# Patient Record
Sex: Female | Born: 1945
Health system: Southern US, Community
[De-identification: ages and names within clinical notes are randomized; demographics above are authoritative.]

## PROBLEM LIST (undated history)

## (undated) DIAGNOSIS — Z9109 Other allergy status, other than to drugs and biological substances: Secondary | ICD-10-CM

## (undated) DIAGNOSIS — G43909 Migraine, unspecified, not intractable, without status migrainosus: Secondary | ICD-10-CM

## (undated) DIAGNOSIS — E7800 Pure hypercholesterolemia, unspecified: Secondary | ICD-10-CM

## (undated) DIAGNOSIS — H269 Unspecified cataract: Secondary | ICD-10-CM

## (undated) DIAGNOSIS — T7840XA Allergy, unspecified, initial encounter: Secondary | ICD-10-CM

## (undated) DIAGNOSIS — Z5189 Encounter for other specified aftercare: Secondary | ICD-10-CM

## (undated) DIAGNOSIS — C801 Malignant (primary) neoplasm, unspecified: Secondary | ICD-10-CM

## (undated) DIAGNOSIS — I10 Essential (primary) hypertension: Secondary | ICD-10-CM

## (undated) HISTORY — DX: Malignant (primary) neoplasm, unspecified: C80.1

## (undated) HISTORY — DX: Migraine, unspecified, not intractable, without status migrainosus: G43.909

## (undated) HISTORY — DX: Encounter for other specified aftercare: Z51.89

## (undated) HISTORY — DX: Pure hypercholesterolemia, unspecified: E78.00

## (undated) HISTORY — DX: Essential (primary) hypertension: I10

## (undated) HISTORY — DX: Unspecified cataract: H26.9

## (undated) HISTORY — PX: SEPTOPLASTY: SUR1290

## (undated) HISTORY — DX: Other allergy status, other than to drugs and biological substances: Z91.09

## (undated) HISTORY — DX: Allergy, unspecified, initial encounter: T78.40XA

---

## 1965-12-21 HISTORY — PX: NOSE SURGERY: SHX723

## 1998-12-21 HISTORY — PX: APPENDECTOMY: SHX54

## 1998-12-21 HISTORY — PX: ABDOMINAL HYSTERECTOMY: SHX81

## 2007-08-05 ENCOUNTER — Ambulatory Visit: Payer: Self-pay | Admitting: Otolaryngology

## 2012-11-16 LAB — HM MAMMOGRAPHY: HM Mammogram: NEGATIVE

## 2012-11-28 ENCOUNTER — Ambulatory Visit (INDEPENDENT_AMBULATORY_CARE_PROVIDER_SITE_OTHER): Payer: Medicare Other | Admitting: Internal Medicine

## 2012-11-28 ENCOUNTER — Encounter: Payer: Self-pay | Admitting: Internal Medicine

## 2012-11-28 VITALS — BP 144/82 | HR 79 | Temp 97.7°F | Ht 62.5 in | Wt 129.2 lb

## 2012-11-28 DIAGNOSIS — I10 Essential (primary) hypertension: Secondary | ICD-10-CM

## 2012-11-28 DIAGNOSIS — M79673 Pain in unspecified foot: Secondary | ICD-10-CM

## 2012-11-28 DIAGNOSIS — R5383 Other fatigue: Secondary | ICD-10-CM

## 2012-11-28 DIAGNOSIS — Z87440 Personal history of urinary (tract) infections: Secondary | ICD-10-CM

## 2012-11-28 DIAGNOSIS — E7800 Pure hypercholesterolemia, unspecified: Secondary | ICD-10-CM

## 2012-11-28 DIAGNOSIS — R5381 Other malaise: Secondary | ICD-10-CM

## 2012-11-28 DIAGNOSIS — M79609 Pain in unspecified limb: Secondary | ICD-10-CM

## 2012-11-28 DIAGNOSIS — Z9109 Other allergy status, other than to drugs and biological substances: Secondary | ICD-10-CM

## 2012-11-28 NOTE — Patient Instructions (Addendum)
It was nice meeting you today.  I am glad your are getting along well.  Let me know if you need anything.

## 2012-11-30 ENCOUNTER — Other Ambulatory Visit (INDEPENDENT_AMBULATORY_CARE_PROVIDER_SITE_OTHER): Payer: Medicare Other

## 2012-11-30 DIAGNOSIS — I10 Essential (primary) hypertension: Secondary | ICD-10-CM

## 2012-11-30 DIAGNOSIS — R5381 Other malaise: Secondary | ICD-10-CM

## 2012-11-30 DIAGNOSIS — E7800 Pure hypercholesterolemia, unspecified: Secondary | ICD-10-CM

## 2012-11-30 DIAGNOSIS — R5383 Other fatigue: Secondary | ICD-10-CM

## 2012-11-30 LAB — CBC WITH DIFFERENTIAL/PLATELET
Basophils Relative: 0.5 % (ref 0.0–3.0)
Eosinophils Relative: 4.4 % (ref 0.0–5.0)
Lymphocytes Relative: 17.3 % (ref 12.0–46.0)
MCV: 89.8 fl (ref 78.0–100.0)
Monocytes Absolute: 0.4 10*3/uL (ref 0.1–1.0)
Monocytes Relative: 4.9 % (ref 3.0–12.0)
Neutrophils Relative %: 72.9 % (ref 43.0–77.0)
Platelets: 255 10*3/uL (ref 150.0–400.0)
RBC: 4.45 Mil/uL (ref 3.87–5.11)
WBC: 8.1 10*3/uL (ref 4.5–10.5)

## 2012-11-30 LAB — HEPATIC FUNCTION PANEL
ALT: 46 U/L — ABNORMAL HIGH (ref 0–35)
AST: 39 U/L — ABNORMAL HIGH (ref 0–37)
Albumin: 4.1 g/dL (ref 3.5–5.2)
Alkaline Phosphatase: 117 U/L (ref 39–117)
Total Protein: 7.6 g/dL (ref 6.0–8.3)

## 2012-11-30 LAB — BASIC METABOLIC PANEL
BUN: 21 mg/dL (ref 6–23)
CO2: 31 mEq/L (ref 19–32)
Chloride: 102 mEq/L (ref 96–112)
Glucose, Bld: 85 mg/dL (ref 70–99)
Potassium: 3.5 mEq/L (ref 3.5–5.1)
Sodium: 141 mEq/L (ref 135–145)

## 2012-11-30 LAB — LIPID PANEL: HDL: 48.3 mg/dL (ref >39.00)

## 2012-11-30 LAB — TSH: TSH: 0.9 u[IU]/mL (ref 0.35–5.50)

## 2012-11-30 LAB — LDL CHOLESTEROL, DIRECT: Direct LDL: 191.4 mg/dL

## 2012-12-02 ENCOUNTER — Ambulatory Visit (INDEPENDENT_AMBULATORY_CARE_PROVIDER_SITE_OTHER): Payer: Medicare Other | Admitting: Internal Medicine

## 2012-12-02 ENCOUNTER — Encounter: Payer: Self-pay | Admitting: Internal Medicine

## 2012-12-02 VITALS — BP 144/88 | HR 72 | Temp 97.6°F | Ht 62.5 in | Wt 128.2 lb

## 2012-12-02 DIAGNOSIS — I10 Essential (primary) hypertension: Secondary | ICD-10-CM

## 2012-12-02 DIAGNOSIS — R7989 Other specified abnormal findings of blood chemistry: Secondary | ICD-10-CM

## 2012-12-02 DIAGNOSIS — E7800 Pure hypercholesterolemia, unspecified: Secondary | ICD-10-CM

## 2012-12-02 DIAGNOSIS — R945 Abnormal results of liver function studies: Secondary | ICD-10-CM

## 2012-12-02 DIAGNOSIS — Z87440 Personal history of urinary (tract) infections: Secondary | ICD-10-CM

## 2012-12-02 MED ORDER — LEVOFLOXACIN 500 MG PO TABS: 500.0000 mg | ORAL_TABLET | Freq: Every day | ORAL | Status: DC

## 2012-12-04 ENCOUNTER — Encounter: Payer: Self-pay | Admitting: Internal Medicine

## 2012-12-04 DIAGNOSIS — E7800 Pure hypercholesterolemia, unspecified: Secondary | ICD-10-CM | POA: Insufficient documentation

## 2012-12-04 DIAGNOSIS — Z9109 Other allergy status, other than to drugs and biological substances: Secondary | ICD-10-CM | POA: Insufficient documentation

## 2012-12-04 DIAGNOSIS — Z87440 Personal history of urinary (tract) infections: Secondary | ICD-10-CM | POA: Insufficient documentation

## 2012-12-04 DIAGNOSIS — I10 Essential (primary) hypertension: Secondary | ICD-10-CM | POA: Insufficient documentation

## 2012-12-04 NOTE — Assessment & Plan Note (Signed)
Low cholesterol diet.  Will hold on medication until sort through the issues with the liver function.  Follow.

## 2012-12-04 NOTE — Assessment & Plan Note (Signed)
Previously saw Dr [name].  Had extensive w/up.  Was on allergy injections.  Now takes Zyrtec.  Follow.

## 2012-12-04 NOTE — Assessment & Plan Note (Signed)
Being worked up at [name]  Planning for cystoscopy 12/08/12.  Currently asymptomatic.

## 2012-12-04 NOTE — Assessment & Plan Note (Signed)
Planning for cystoscopy next week.  Since using Levaquin - will not need to take the Cipro.  Instructed her to notify urology of the infection and that she is taking Levaquin.

## 2012-12-04 NOTE — Assessment & Plan Note (Signed)
Blood pressure elevated.  Treat the infection.  See if blood pressure improves.  If persistent elevation, will require changes in her medication.

## 2012-12-04 NOTE — Assessment & Plan Note (Signed)
Stopped lipitor secondary to arm pain.  Pain resolved.  On no cholesterol mediation currently.  Just stopped right at Thanksgiving.  Check lipid profile and liver panel.  Low cholesterol diet.

## 2012-12-04 NOTE — Assessment & Plan Note (Signed)
Blood pressure a little elevated today.  Continue same med regimen for now.  Have her spot check her pressure and record.  Get her back in soon to reassess.  If persistent elevation, will need to adjust her medication.  Check metabolic panel.

## 2012-12-04 NOTE — Progress Notes (Signed)
  Subjective:    Patient ID: Jamie Elliott, female    DOB: 1946-07-07, 66 y.o.   MRN: [id number]  HPI 66 year old female with past history of hypertension and hypercholesterolemia who comes in today with concerns regarding a sinus infection.  States symptoms started over the last few days.  Sinus headache.  Felt dizzy initially - with movement.  Ears feel stopped up and full.  Glands aching.  Increased sinus pressure.  Yellow mucus production.  Increased cough.  No chest congestion.  Some hoarseness.  Minimal scratchy throat.  Some decreased appetite, but tolerating po's.  Due to get her cystoscopy next week.  Has cipro to take next week.   Past Medical History  Diagnosis Date  . Hypertension   . Hypercholesterolemia   . Migraines   . Environmental allergies     Review of Systems Patient reports increased sinus pressure, congestion and lightheadedness.  Symptoms as outlined.  No chest pain, tightness or palpitations.  No increased shortness of breath.  Does report some minimal cough.   No nausea or vomiting.  No abdominal pain or cramping.  No bowel change, such as diarrhea, constipation, BRBPR or melana.  No urine change.        Objective:   Physical Exam Filed Vitals:   12/02/12 1143  BP: 144/88  Pulse: 72  Temp: 97.6 F (50.27 C)   66 year old female in no acute distress.   HEENT:  Nares - slightly erythematous turbinates.  Minimal tenderness to palpation over the maxillary sinus.  TMs without erythema.   OP- without lesions or erythema.  NECK:  Supple, nontender.   HEART:  Appears to be regular. LUNGS:  Without crackles or wheezing audible.  Respirations even and unlabored.       Assessment & Plan:  PROBABLE SINUSITIS.  Treat with Levaquin 500mg  q day x 10 days.  Saline nasal flushes as directed.  Follow.  Notify me if persistent problems.   ABNORMAL LIVER FUNCTION.  Discussed with her today.  Found to be abnormal on recent blood test.  Will recheck liver function soon to confirm  normalizes.  Further w/up pending results.

## 2012-12-04 NOTE — Progress Notes (Signed)
  Subjective:    Patient ID: Jamie Elliott, female    DOB: [birth year], 66 y.o.   MRN: [id number]  HPI 66 year old female with past history of hypertension, hypercholesterolemia and allergies who comes in today to follow up on theses issues as well as to establish care.  She has previously been seeing Dr [name].  States she has a history of recurring sinus infections.  Saw Dr [name].  Had a w/up.  Was on allergy injections.  Now takes Zyrtec.  Under reasonable control now.  She had been on Lipitor.  Stopped this secondary to pain in her arm.  Pain resolved with stopping the lipitor.  This fall she has had problems with recurring bladder infections.  Being worked up by [name]  [name].  Planning for a cystoscopy 12/19.  Currently asymptomatic.  Stays active.  No cardiac symptoms with increased activity or exertion.  She has had some pain a the base of her fourth and fifth toe (bottom of her foot).  Request to see podiatry.  Has been persistent.  Discussed wearing good support shoes.     Past Medical History  Diagnosis Date  . Hypertension   . Hypercholesterolemia   . Migraines   . Environmental allergies     Review of Systems Patient denies any headache, lightheadedness or dizziness.  No signigicant sinus or allergy symptoms now.  No chest pain, tightness or palpitations.  No increased shortness of breath, cough or congestion.  No acid reflux.  No nausea or vomiting.  No abdominal pain or cramping.  No bowel change, such as diarrhea, constipation, BRBPR or melana.  No urine change currently.  Has had several bladder infections recently.  Mamm[facility] are done at [facility].  States she is up to date. States she used to take Fosamax and Boniva.  Off now.  Bone density improved.      Objective:   Physical Exam Filed Vitals:   11/28/12 1031  BP: 144/82  Pulse: 79  Temp: 97.7 F (41.77 C)   66 year old female in no acute distress.   HEENT:  Nares - clear.  OP- without lesions  or erythema.  NECK:  Supple, nontender.  No audible bruit.   HEART:  Appears to be regular. LUNGS:  Without crackles or wheezing audible.  Respirations even and unlabored.   RADIAL PULSE:  Equal bilaterally.  ABDOMEN:  Soft, nontender.  No audible abdominal bruit.   EXTREMITIES:  No increased edema to be present.                   Assessment & Plan:  FOOT PAIN.  See above.  Will refer to podiatry for evaluation and treatment.    HEALTH MAINTENANCE.  Obtain records for review.  States she is up to date with her mammograms.  Has these performed at [facility].  Per letter - mammogram 11/21/12 - ok.  I spent at least 45 minutes with this patient and at least 50% of the time was spent in consultation regarding the above.

## 2012-12-07 ENCOUNTER — Other Ambulatory Visit (INDEPENDENT_AMBULATORY_CARE_PROVIDER_SITE_OTHER): Payer: Medicare Other

## 2012-12-07 DIAGNOSIS — R945 Abnormal results of liver function studies: Secondary | ICD-10-CM

## 2012-12-07 DIAGNOSIS — R7989 Other specified abnormal findings of blood chemistry: Secondary | ICD-10-CM

## 2012-12-07 LAB — HEPATIC FUNCTION PANEL
ALT: 36 U/L — ABNORMAL HIGH (ref 0–35)
AST: 30 U/L (ref 0–37)
Bilirubin, Direct: 0 mg/dL (ref 0.0–0.3)
Total Bilirubin: 0.7 mg/dL (ref 0.3–1.2)

## 2012-12-08 ENCOUNTER — Other Ambulatory Visit: Payer: Self-pay | Admitting: Internal Medicine

## 2012-12-08 ENCOUNTER — Telehealth: Payer: Self-pay | Admitting: *Deleted

## 2012-12-08 DIAGNOSIS — R945 Abnormal results of liver function studies: Secondary | ICD-10-CM

## 2012-12-08 DIAGNOSIS — R7989 Other specified abnormal findings of blood chemistry: Secondary | ICD-10-CM

## 2012-12-08 NOTE — Progress Notes (Signed)
Placed order for follow up liver panel

## 2012-12-08 NOTE — Telephone Encounter (Signed)
Patient wants to know if you going to wait to start her on cholesterol med until after her next lab?

## 2012-12-15 ENCOUNTER — Encounter: Payer: Self-pay | Admitting: Internal Medicine

## 2012-12-22 ENCOUNTER — Telehealth: Payer: Self-pay | Admitting: Internal Medicine

## 2012-12-22 NOTE — Telephone Encounter (Signed)
Sinuses not draining and having headaches. Using saline

## 2012-12-22 NOTE — Telephone Encounter (Signed)
Pt came in today stating she has an appointment with you on 01/10/13.  She stated she is still is having problems with her sinus and wanted to know if you would call her something else [facility]

## 2012-12-22 NOTE — Telephone Encounter (Signed)
Is she better but just not completely resolved.  Need more info.  If no better, will need to be reevaluated.

## 2012-12-22 NOTE — Telephone Encounter (Signed)
Spoke to pt.  She informed me she has been under increased stress.  She has not been monitoring her blood pressure.  She is taking zyrtec daily and not using saline nasal spray regularly.  She stated it did help to rinse her nose.  Instructed to temporarily stop the zyrtec (may be drying).  Flush with saline nasal spray - a few times per day.  I also wanted to see her to evaluate and make sure nothing else contributing to her headache.  She states she wants to try above recs and monitor her blood pressure and keep her regular appt.  I informed her that if she changed her mind or had any other changes or problems to let me know or be reevaluated.

## 2012-12-26 ENCOUNTER — Other Ambulatory Visit (INDEPENDENT_AMBULATORY_CARE_PROVIDER_SITE_OTHER): Payer: Medicare Other

## 2012-12-26 DIAGNOSIS — R945 Abnormal results of liver function studies: Secondary | ICD-10-CM

## 2012-12-26 DIAGNOSIS — R7989 Other specified abnormal findings of blood chemistry: Secondary | ICD-10-CM

## 2012-12-26 LAB — HEPATIC FUNCTION PANEL
Albumin: 3.7 g/dL (ref 3.5–5.2)
Alkaline Phosphatase: 89 U/L (ref 39–117)
Total Protein: 7.1 g/dL (ref 6.0–8.3)

## 2012-12-27 ENCOUNTER — Other Ambulatory Visit: Payer: Medicare Other

## 2012-12-28 ENCOUNTER — Other Ambulatory Visit: Payer: Medicare Other

## 2013-01-10 ENCOUNTER — Encounter: Payer: Self-pay | Admitting: Internal Medicine

## 2013-01-10 ENCOUNTER — Ambulatory Visit (INDEPENDENT_AMBULATORY_CARE_PROVIDER_SITE_OTHER): Payer: Medicare Other | Admitting: Internal Medicine

## 2013-01-10 VITALS — BP 142/80 | HR 75 | Temp 98.7°F | Ht 62.5 in

## 2013-01-10 DIAGNOSIS — I10 Essential (primary) hypertension: Secondary | ICD-10-CM

## 2013-01-10 DIAGNOSIS — Z23 Encounter for immunization: Secondary | ICD-10-CM

## 2013-01-10 DIAGNOSIS — E7800 Pure hypercholesterolemia, unspecified: Secondary | ICD-10-CM

## 2013-01-10 DIAGNOSIS — Z9109 Other allergy status, other than to drugs and biological substances: Secondary | ICD-10-CM

## 2013-01-10 MED ORDER — PRAVASTATIN SODIUM 10 MG PO TABS: ORAL_TABLET | ORAL | Status: DC

## 2013-01-11 MED ORDER — TETANUS-DIPHTH-ACELL PERTUSSIS 5-2.5-18.5 LF-MCG/0.5 IM SUSP
0.5000 mL | Freq: Once | INTRAMUSCULAR | Status: AC
  Administered 2013-01-11: 0.5 mL via INTRAMUSCULAR

## 2013-01-12 ENCOUNTER — Telehealth: Payer: Self-pay | Admitting: Internal Medicine

## 2013-01-12 ENCOUNTER — Encounter: Payer: Self-pay | Admitting: Internal Medicine

## 2013-01-12 ENCOUNTER — Ambulatory Visit (INDEPENDENT_AMBULATORY_CARE_PROVIDER_SITE_OTHER): Payer: Medicare Other | Admitting: Internal Medicine

## 2013-01-12 VITALS — BP 160/98 | HR 79 | Temp 98.1°F

## 2013-01-12 DIAGNOSIS — I10 Essential (primary) hypertension: Secondary | ICD-10-CM

## 2013-01-12 DIAGNOSIS — T887XXA Unspecified adverse effect of drug or medicament, initial encounter: Secondary | ICD-10-CM

## 2013-01-12 NOTE — Telephone Encounter (Signed)
Tried calling pt all she had was home number she told us her cell was broken. Left message on home machine to call back and see if she could get here by 11:45.

## 2013-01-12 NOTE — Telephone Encounter (Signed)
Jamie Elliott is trying to contact patient. Has left message for patient to come in for appointment.

## 2013-01-12 NOTE — Telephone Encounter (Signed)
Pt had DTap and she is having a bad reaction to it. It is red/ irritated/Swollen/itches/sore. Please call [phone]

## 2013-01-12 NOTE — Telephone Encounter (Signed)
Confirm only local reaction and no sob.  See if she can come on in and let me looks at it - have her come now - can put her in 11:45 spot.

## 2013-01-15 ENCOUNTER — Encounter: Payer: Self-pay | Admitting: Internal Medicine

## 2013-01-15 NOTE — Assessment & Plan Note (Signed)
Blood pressure appears to be doing well on outside checks.  Same medication regiment.  Follow.

## 2013-01-15 NOTE — Assessment & Plan Note (Signed)
Blood pressure elevated today.  Feel related to above.  Follow pressures.  Record.  Let me know if remains elevated.

## 2013-01-15 NOTE — Assessment & Plan Note (Signed)
Doing well.  Off zyrtec temporarily.  Felt it was drying too much.  Doing well. Continue saline nasal irrigation.

## 2013-01-15 NOTE — Progress Notes (Signed)
  Subjective:    Patient ID: Jamie Elliott, female    DOB: [birth year], 67 y.o.   MRN: [id number]  HPI 67 year old female with past history of hypertension, hypercholesterolemia and allergies who comes in today for a scheduled follow up.  She states that her previous headache has resolved.  She backed off the oral antihistamine.  Was drying her too much.  Using saline nasal spray.  Doing better now.  Blood pressure has been doing well on outside checks.  BPs averaging 122-144/70s.  On no cholesterol medication.  Discussed starting a new cholesterol medication.  She had arm soreness with lipitor.  Overall she feel she is doing well.    Past Medical History  Diagnosis Date  . Hypertension   . Hypercholesterolemia   . Migraines   . Environmental allergies     Current Outpatient Prescriptions on File Prior to Visit  Medication Sig Dispense Refill  . aspirin 325 MG EC tablet Take 325 mg by mouth daily.      . Calcium Carbonate-Vitamin D (CALCIUM 600+D) 600-200 MG-UNIT TABS Take by mouth 2 (two) times daily.      Marland Kitchen triamcinolone (NASACORT) 55 MCG/ACT nasal inhaler Place 2 sprays into the nose as needed.      . valsartan-hydrochlorothiazide (DIOVAN-HCT) 160-12.5 MG per tablet Take 1 tablet by mouth daily.      . cetirizine (ZYRTEC) 10 MG tablet Take 10 mg by mouth daily.      . pravastatin (PRAVACHOL) 10 MG tablet Take one tablet q mon, wed and friday  30 tablet  2    Review of Systems Patient denies any headache, lightheadedness or dizziness.  No significant sinus or allergy symptoms now.   Doing better.  Temporarily off her zyrtec.   No chest pain, tightness or palpitations.  No increased shortness of breath, cough or congestion.  No acid reflux.  No nausea or vomiting.  No abdominal pain or cramping.  No bowel change, such as diarrhea, constipation, BRBPR or melana.  No urine change.        Objective:   Physical Exam Filed Vitals:   01/10/13 1006  BP: 142/80  Pulse: 75  Temp: 98.7 F (66.28  C)   67 year old female in no acute distress.   HEENT:  Nares- clear.  Oropharynx - without lesions. NECK:  Supple.  Nontender.  No audible bruit.  HEART:  Appears to be regular. LUNGS:  No crackles or wheezing audible.  Respirations even and unlabored.  RADIAL PULSE:  Equal bilaterally.  ABDOMEN:  Soft, nontender.  Bowel sounds present and normal.  No audible abdominal bruit.    EXTREMITIES:  No increased edema present.  DP pulses palpable and equal bilaterally.           Assessment & Plan:  PREVIOUS HEADACHE.  Resolved.  Continue nasal saline.  Follow.    HEALTH MAINTENANCE.  Will schedule a physical for next visit.  Discuss further health maintenance issues with her then.

## 2013-01-15 NOTE — Progress Notes (Signed)
  Subjective:    Patient ID: Jamie Elliott, female    DOB: 1946-06-28, 67 y.o.   MRN: [id number]  HPI 67 year old female with past history of hypertension, hypercholesterolemia and allergies who comes in today as a work in with concerns regarding an allergic rxn to a tetanus booster.  She states she noticed a little redness yesterday am.  Was quarter size yesterday.  Now increased size and increased redness.  Itching.  No fever.  No chills.  States otherwise she feels she is doing well.      Past Medical History  Diagnosis Date  . Hypertension   . Hypercholesterolemia   . Migraines   . Environmental allergies     Current Outpatient Prescriptions on File Prior to Visit  Medication Sig Dispense Refill  . aspirin 325 MG EC tablet Take 325 mg by mouth daily.      . Calcium Carbonate-Vitamin D (CALCIUM 600+D) 600-200 MG-UNIT TABS Take by mouth 2 (two) times daily.      . cetirizine (ZYRTEC) 10 MG tablet Take 10 mg by mouth daily.      . pravastatin (PRAVACHOL) 10 MG tablet Take one tablet q mon, wed and friday  30 tablet  2  . triamcinolone (NASACORT) 55 MCG/ACT nasal inhaler Place 2 sprays into the nose as needed.      . valsartan-hydrochlorothiazide (DIOVAN-HCT) 160-12.5 MG per tablet Take 1 tablet by mouth daily.        Review of Systems Patient denies any headache, lightheadedness or dizziness.  No significant sinus or allergy symptoms now.   Doing better.  Has been off her zyrtec.  Increased itching. Rash localized to the injection site.  No other rash.  No sob.  No difficulty breathing.        Objective:   Physical Exam  Filed Vitals:   01/12/13 1153  BP: 160/98  Pulse: 79  Temp: 98.1 F (36.7 C)   Blood pressure recheck:  56/10  67 year old female in no acute distress.   HEENT:  Nares- clear.  Oropharynx - without lesions. NECK:  Supple.  Nontender.    HEART:  Appears to be regular. LUNGS:  No crackles or wheezing audible.  Respirations even and unlabored.     SKIN:   Circular rash upper left arm.  (approximately 5x4cm).   Non tender.  No evidence of infection.     Assessment & Plan:  ALLERGIC REACTION.  Allergic to Tdap injection.  Exam as outlined.  Localized rash.  Treat with zyrtec.  Follow.    HEALTH MAINTENANCE.  Scheduled a physical for next visit.  Discuss further health maintenance issues with her then.

## 2013-01-15 NOTE — Assessment & Plan Note (Signed)
On no cholesterol medication now.  lipitor caused arm pain.  Start pravastatin 10mg  as directed.  Follow.  Check liver panel in 6 weeks.

## 2013-02-22 ENCOUNTER — Other Ambulatory Visit (INDEPENDENT_AMBULATORY_CARE_PROVIDER_SITE_OTHER): Payer: Medicare Other

## 2013-02-22 DIAGNOSIS — E7800 Pure hypercholesterolemia, unspecified: Secondary | ICD-10-CM

## 2013-02-22 LAB — HEPATIC FUNCTION PANEL
Albumin: 3.5 g/dL (ref 3.5–5.2)
Alkaline Phosphatase: 74 U/L (ref 39–117)
Total Bilirubin: 0.4 mg/dL (ref 0.3–1.2)

## 2013-02-23 ENCOUNTER — Ambulatory Visit (INDEPENDENT_AMBULATORY_CARE_PROVIDER_SITE_OTHER): Payer: Medicare Other | Admitting: Adult Health

## 2013-02-23 ENCOUNTER — Ambulatory Visit (INDEPENDENT_AMBULATORY_CARE_PROVIDER_SITE_OTHER)
Admission: RE | Admit: 2013-02-23 | Discharge: 2013-02-23 | Disposition: A | Discharge status: Home / Self Care | Payer: Medicare Other | Source: Ambulatory Visit | Attending: Adult Health | Admitting: Adult Health

## 2013-02-23 ENCOUNTER — Encounter: Payer: Self-pay | Admitting: Adult Health

## 2013-02-23 VITALS — BP 160/95 | HR 71 | Temp 97.9°F | Resp 14 | Ht 62.0 in | Wt 130.0 lb

## 2013-02-23 DIAGNOSIS — M25539 Pain in unspecified wrist: Secondary | ICD-10-CM

## 2013-02-23 DIAGNOSIS — M25531 Pain in right wrist: Secondary | ICD-10-CM

## 2013-02-23 NOTE — Progress Notes (Signed)
  Subjective:    Patient ID: Jamie Elliott, female    DOB: 1946-03-28, 67 y.o.   MRN: [id number]  HPI  Patient presents to clinic with c/o pain in right base of thumb. She reports "I injured it about 1 year ago". She went to see her PCP, at the time, but she was told that it was too late and nothing could be done about it. "I think I may have arthritis or bursitis". Denies numbness or tingling in her fingers. She experiences what she describes as intermittent pain, not constant. She cares for her 3 grandchildren during the day and reports being in constant motion.   Current Outpatient Prescriptions on File Prior to Visit  Medication Sig Dispense Refill  . aspirin 325 MG EC tablet Take 325 mg by mouth daily.      . Calcium Carbonate-Vitamin D (CALCIUM 600+D) 600-200 MG-UNIT TABS Take by mouth 2 (two) times daily.      . cetirizine (ZYRTEC) 10 MG tablet Take 10 mg by mouth daily.      . pravastatin (PRAVACHOL) 10 MG tablet Take one tablet q mon, wed and friday  30 tablet  2  . triamcinolone (NASACORT) 55 MCG/ACT nasal inhaler Place 2 sprays into the nose as needed.      . valsartan-hydrochlorothiazide (DIOVAN-HCT) 160-12.5 MG per tablet Take 1 tablet by mouth daily.       No current facility-administered medications on file prior to visit.     Review of Systems  Musculoskeletal: Positive for arthralgias.       Pain localized at the base of the right thumb.  Skin: Negative for pallor and rash.  Neurological: Negative for weakness and numbness.   BP 160/95  Pulse 71  Temp(Src) 97.9 F (36.6 C) (Oral)  Resp 14  Ht 5\' 2"  (1.575 m)  Wt 130 lb (58.968 kg)  BMI 23.77 kg/m2  SpO2 100%  LMP 11/28/1998     Objective:   Physical Exam  Constitutional: She is oriented to person, place, and time. She appears well-developed and well-nourished. No distress.  Musculoskeletal: Normal range of motion. She exhibits tenderness.  Pain is localized at the base of the right thumb. She has full ROM.  There is slight edema as compared to the left.   Neurological: She is alert and oriented to person, place, and time.  Skin: Skin is warm and dry. No rash noted. No erythema.  Psychiatric: She has a normal mood and affect. Her behavior is normal. Judgment and thought content normal.          Assessment & Plan:

## 2013-02-23 NOTE — Assessment & Plan Note (Signed)
Ordered xray of the right wrist r/o arthritis. Discussed trying to immobilize the joint for a few days - she is taking care of her grandchildren and possibly has continued to cause trauma to the area. Also advised to take ibuprofen 200 mg every 6 hours as needed for pain. If this is arthritis and the pain persists, pt may have option of injection of the joint by ortho.

## 2013-02-23 NOTE — Patient Instructions (Addendum)
  Please go to our [facility] office for the xray of your right wrist.  You can take ibuprofen 200 mg every 6 hours as needed for pain.

## 2013-02-27 ENCOUNTER — Encounter: Payer: Self-pay | Admitting: Internal Medicine

## 2013-03-20 ENCOUNTER — Telehealth: Payer: Self-pay | Admitting: Internal Medicine

## 2013-03-20 NOTE — Telephone Encounter (Signed)
Pt is needing refill on Valsartan HCTZ 160-`2.5 m 30 tab, Pravastatin Sodium 10 mg 30 tab, Cetirizine HCL 10 mg 30 tab and Triamcinolone Acetonide Nasal Spray 0.055 mg spray.  Pt uses [facility] on [address] In [place].

## 2013-03-22 MED ORDER — CETIRIZINE HCL 10 MG PO TABS: 10.0000 mg | ORAL_TABLET | Freq: Every day | ORAL | Status: DC

## 2013-03-22 MED ORDER — PRAVASTATIN SODIUM 10 MG PO TABS: ORAL_TABLET | ORAL | Status: DC

## 2013-03-22 MED ORDER — TRIAMCINOLONE ACETONIDE(NASAL) 55 MCG/ACT NA INHA: 2.0000 | NASAL | Status: AC | PRN

## 2013-03-22 MED ORDER — VALSARTAN-HYDROCHLOROTHIAZIDE 160-12.5 MG PO TABS: 1.0000 | ORAL_TABLET | Freq: Every day | ORAL | Status: DC

## 2013-03-22 NOTE — Telephone Encounter (Signed)
Rx sent in to pharmacy. 

## 2013-04-05 ENCOUNTER — Encounter: Payer: Self-pay | Admitting: Internal Medicine

## 2013-04-05 ENCOUNTER — Ambulatory Visit (INDEPENDENT_AMBULATORY_CARE_PROVIDER_SITE_OTHER): Payer: Medicare Other | Admitting: Internal Medicine

## 2013-04-05 ENCOUNTER — Other Ambulatory Visit (HOSPITAL_COMMUNITY)
Admission: RE | Admit: 2013-04-05 | Discharge: 2013-04-05 | Disposition: A | Discharge status: Home / Self Care | Payer: Medicare Other | Source: Ambulatory Visit | Attending: Internal Medicine | Admitting: Internal Medicine

## 2013-04-05 VITALS — BP 120/70 | HR 80 | Temp 98.6°F | Ht 61.0 in | Wt 128.0 lb

## 2013-04-05 DIAGNOSIS — I10 Essential (primary) hypertension: Secondary | ICD-10-CM

## 2013-04-05 DIAGNOSIS — Z1151 Encounter for screening for human papillomavirus (HPV): Secondary | ICD-10-CM | POA: Insufficient documentation

## 2013-04-05 DIAGNOSIS — M25531 Pain in right wrist: Secondary | ICD-10-CM

## 2013-04-05 DIAGNOSIS — E7800 Pure hypercholesterolemia, unspecified: Secondary | ICD-10-CM

## 2013-04-05 DIAGNOSIS — M25539 Pain in unspecified wrist: Secondary | ICD-10-CM

## 2013-04-05 DIAGNOSIS — Z01419 Encounter for gynecological examination (general) (routine) without abnormal findings: Secondary | ICD-10-CM | POA: Insufficient documentation

## 2013-04-05 DIAGNOSIS — Z124 Encounter for screening for malignant neoplasm of cervix: Secondary | ICD-10-CM

## 2013-04-05 DIAGNOSIS — Z9109 Other allergy status, other than to drugs and biological substances: Secondary | ICD-10-CM

## 2013-04-05 LAB — BASIC METABOLIC PANEL
CO2: 27 mEq/L (ref 19–32)
Calcium: 9.3 mg/dL (ref 8.4–10.5)
Glucose, Bld: 82 mg/dL (ref 70–99)
Potassium: 3.4 mEq/L — ABNORMAL LOW (ref 3.5–5.1)
Sodium: 138 mEq/L (ref 135–145)

## 2013-04-05 LAB — HEPATIC FUNCTION PANEL
Bilirubin, Direct: 0 mg/dL (ref 0.0–0.3)
Total Bilirubin: 0.6 mg/dL (ref 0.3–1.2)

## 2013-04-05 LAB — LIPID PANEL
HDL: 44.4 mg/dL (ref >39.00)
VLDL: 31.6 mg/dL (ref 0.0–40.0)

## 2013-04-06 ENCOUNTER — Other Ambulatory Visit: Payer: Self-pay | Admitting: Internal Medicine

## 2013-04-06 ENCOUNTER — Encounter: Payer: Self-pay | Admitting: Internal Medicine

## 2013-04-06 DIAGNOSIS — E876 Hypokalemia: Secondary | ICD-10-CM

## 2013-04-06 NOTE — Assessment & Plan Note (Signed)
On Pravastatin 10 mg q mon, wed and Friday.  Check lipid panel and liver function today.

## 2013-04-06 NOTE — Assessment & Plan Note (Signed)
Doing well. Continue saline nasal irrigation.  Follow.  Uses Zyrtec prn.

## 2013-04-06 NOTE — Progress Notes (Signed)
Scheduled a follow up potassium.

## 2013-04-06 NOTE — Assessment & Plan Note (Signed)
Resolved

## 2013-04-06 NOTE — Assessment & Plan Note (Signed)
Blood pressure has been doing well at home.  Same medication regimen.  Check metabolic panel.

## 2013-04-06 NOTE — Progress Notes (Signed)
  Subjective:    Patient ID: Jamie Elliott, female    DOB: 1946-07-10, 67 y.o.   MRN: [id number]  HPI 67 year old female with past history of hypertension, hypercholesterolemia and allergies who comes in today to follow up on these issues as well as for a complete physical exam.  States she is doing well.  No chest pain or tightness.  No sob.  No acid reflux.  No nausea or vomiting.  No bowel change.      Past Medical History  Diagnosis Date  . Hypertension   . Hypercholesterolemia   . Migraines   . Environmental allergies     Current Outpatient Prescriptions on File Prior to Visit  Medication Sig Dispense Refill  . aspirin 325 MG EC tablet Take 325 mg by mouth daily.      . Calcium Carbonate-Vitamin D (CALCIUM 600+D) 600-200 MG-UNIT TABS Take by mouth 2 (two) times daily.      . cetirizine (ZYRTEC) 10 MG tablet Take 1 tablet (10 mg total) by mouth daily.  30 tablet  5  . triamcinolone (NASACORT) 55 MCG/ACT nasal inhaler Place 2 sprays into the nose as needed.  1 Inhaler  5  . valsartan-hydrochlorothiazide (DIOVAN-HCT) 160-12.5 MG per tablet Take 1 tablet by mouth daily.  30 tablet  5   No current facility-administered medications on file prior to visit.    Review of Systems Patient denies any headache, lightheadedness or dizziness.  No sinus or allergy symptoms.  No chest pain, tightness or palpitations.  No increased shortness of breath, cough or congestion.  No nausea or vomiting.  No acid reflux.  No abdominal pain or cramping.  No bowel change, such as diarrhea, constipation, BRBPR or melana.  No urine change.            Objective:   Physical Exam  Filed Vitals:   04/05/13 1039  BP: 120/70  Pulse: 80  Temp: 98.6 F (37 C)   Blood pressure recheck:  138-142/78, pulse 60  67 year old female in no acute distress.   HEENT:  Nares- clear.  Oropharynx - without lesions. NECK:  Supple.  Nontender.  No audible bruit.  HEART:  Appears to be regular. LUNGS:  No crackles or  wheezing audible.  Respirations even and unlabored.  RADIAL PULSE:  Equal bilaterally.    BREASTS:  No nipple discharge or nipple retraction present.  Could not appreciate any distinct nodules or axillary adenopathy.  ABDOMEN:  Soft, nontender.  Bowel sounds present and normal.  No audible abdominal bruit.  GU:  Normal external genitalia.  Vaginal vault without lesions. S/p hysterectomy.  Could not appreciate any adnexal masses or tenderness.   RECTAL:  Heme negative.   EXTREMITIES:  No increased edema present.  DP pulses palpable and equal bilaterally.          Assessment & Plan:  HEALTH MAINTENANCE.  Physical today.  Schedule mammogram.

## 2013-04-08 ENCOUNTER — Encounter: Payer: Self-pay | Admitting: Internal Medicine

## 2013-04-11 ENCOUNTER — Telehealth: Payer: Self-pay | Admitting: Internal Medicine

## 2013-04-11 NOTE — Telephone Encounter (Signed)
Patient called in states she has a sinus infection would like to be seen today.  Please advise if you would like me to work her in and what time, she said that she has been feeling like this since Saturday.

## 2013-04-11 NOTE — Telephone Encounter (Signed)
I have left message for patient to return my call and see if she will come in tomorrow at 12:30.

## 2013-04-11 NOTE — Telephone Encounter (Signed)
I am unable to see her today, but I can work her in tomorrow at 12:30 for this problem.

## 2013-04-12 ENCOUNTER — Ambulatory Visit (INDEPENDENT_AMBULATORY_CARE_PROVIDER_SITE_OTHER): Payer: Medicare Other | Admitting: Internal Medicine

## 2013-04-12 ENCOUNTER — Encounter: Payer: Self-pay | Admitting: Internal Medicine

## 2013-04-12 VITALS — BP 110/70 | HR 81 | Temp 98.1°F | Ht 61.0 in | Wt 127.8 lb

## 2013-04-12 DIAGNOSIS — I10 Essential (primary) hypertension: Secondary | ICD-10-CM

## 2013-04-12 MED ORDER — LEVOFLOXACIN 500 MG PO TABS: 500.0000 mg | ORAL_TABLET | Freq: Every day | ORAL | Status: DC

## 2013-04-12 NOTE — Patient Instructions (Signed)
Continue the nasacort nasal spray as you are doing.  Saline nasal spray - flush nose at least 2-3x/day.  Mucinex - one tablet in the am and robitussin in the evening.  Take the antibiotic (Levaquin) one per day.

## 2013-04-12 NOTE — Telephone Encounter (Signed)
Called and left another message asking patient to call me back so I can see if she can come in today.

## 2013-04-12 NOTE — Telephone Encounter (Signed)
Patient is coming in today at 12:30 she will arrive at 12:15.

## 2013-04-13 ENCOUNTER — Encounter: Payer: Self-pay | Admitting: Internal Medicine

## 2013-04-13 NOTE — Assessment & Plan Note (Signed)
Blood pressure as outlined.  Same medication regimen.  Follow.   

## 2013-04-13 NOTE — Progress Notes (Signed)
  Subjective:    Patient ID: Jamie Elliott, female    DOB: Feb 13, 1946, 67 y.o.   MRN: [id number]  Sinusitis Associated symptoms include coughing.  Cough  67 year old female with past history of hypertension, hypercholesterolemia and allergies who comes in today as a work in with concerns regarding increased sinus pressure and congestion.  States symptoms started this past week.  Increased sinus pressure and congestion. Symptoms worse int eh am. Yellow mucus production.  Low grade fever.  Dry cough.   No chest pain or tightness.  No sob.  No chest congestion.  Ears feel full.  Some fatigue.  No acid reflux.  No nausea or vomiting.  No bowel change.      Past Medical History  Diagnosis Date  . Hypertension   . Hypercholesterolemia   . Migraines   . Environmental allergies     Current Outpatient Prescriptions on File Prior to Visit  Medication Sig Dispense Refill  . aspirin 325 MG EC tablet Take 325 mg by mouth daily.      . Calcium Carbonate-Vitamin D (CALCIUM 600+D) 600-200 MG-UNIT TABS Take by mouth 2 (two) times daily.      . cetirizine (ZYRTEC) 10 MG tablet Take 1 tablet (10 mg total) by mouth daily.  30 tablet  5  . pravastatin (PRAVACHOL) 10 MG tablet Take 10 mg by mouth daily.      Marland Kitchen triamcinolone (NASACORT) 55 MCG/ACT nasal inhaler Place 2 sprays into the nose as needed.  1 Inhaler  5  . valsartan-hydrochlorothiazide (DIOVAN-HCT) 160-12.5 MG per tablet Take 1 tablet by mouth daily.  30 tablet  5   No current facility-administered medications on file prior to visit.    Review of Systems  Respiratory: Positive for cough.   Patient denies any headache, lightheadedness or dizziness.  Sinus pressure and congestion as outlined.  Increased cough.  No chest congestion.  No chest pain, tightness or palpitations.  No increased shortness of breath.  Low grade fever.  No nausea or vomiting.  No acid reflux.  No bowel change.   Had questions regarding her potassium.  Was a little low.  Gave her  the information on foods with increased potassium.           Objective:   Physical Exam  Filed Vitals:   04/12/13 1228  BP: 110/70  Pulse: 81  Temp: 98.1 F (36.7 C)   Blood pressure recheck:  10/25  67 year old female in no acute distress.   HEENT:  Nares- slightly erythematous turbinates.  Increased tenderness to palpation over the sinuses.  TMs visualized - without erythema.  Oropharynx - without lesions. NECK:  Supple.  Nontender.   HEART:  Appears to be regular. LUNGS:  No crackles or wheezing audible.  Respirations even and unlabored.         Assessment & Plan:  SINUSITIS.  Treat with Levaquin 500mg  q day x 10 days.  Saline nasal spray and Flonase as directed. Mucinex in the am and Robitussin in the evening.  Hold the zyrtec temporarily.  Can restart after the above cleared.  Rest.  Fluids.  Explained to her if symptoms changed, worsened or did not resolve, she was to be reevaluated.    HYPOKALEMIA.  She was given information on foods with increased potassium.  Will follow.

## 2013-04-20 ENCOUNTER — Other Ambulatory Visit (INDEPENDENT_AMBULATORY_CARE_PROVIDER_SITE_OTHER): Payer: Medicare Other

## 2013-04-20 DIAGNOSIS — E876 Hypokalemia: Secondary | ICD-10-CM

## 2013-04-20 LAB — POTASSIUM: Potassium: 3.6 mEq/L (ref 3.5–5.1)

## 2013-05-09 ENCOUNTER — Telehealth: Payer: Self-pay | Admitting: Internal Medicine

## 2013-05-09 ENCOUNTER — Other Ambulatory Visit: Payer: Self-pay | Admitting: *Deleted

## 2013-05-09 MED ORDER — PRAVASTATIN SODIUM 10 MG PO TABS: 10.0000 mg | ORAL_TABLET | Freq: Every day | ORAL | Status: DC

## 2013-05-09 NOTE — Telephone Encounter (Signed)
The physician has increased her pravastatin (PRAVACHOL) 10 MG tablet  prescription to every day. She is needing someone to call the drug store to increase her prescription amount to cover her dosing.

## 2013-05-09 NOTE — Telephone Encounter (Signed)
Sent new Rx for Pravastatin to pharmacy

## 2013-05-11 ENCOUNTER — Telehealth: Payer: Self-pay | Admitting: *Deleted

## 2013-05-11 NOTE — Telephone Encounter (Signed)
Pt dropped off a "Notice of Denial of payment" from DOS: 04/05/13. They denied procedure code: 11914 (Infectious agent detection by nucleic acid (DNA or RNA); papillomavirus). The paper work is in Writer

## 2013-05-12 NOTE — Telephone Encounter (Signed)
I don't know that anything can be done about this.  Medicare must not cover the HPV.  Can you please explain to pt - if this is correct.   Thanks.  Paper on your desk.

## 2013-05-18 NOTE — Telephone Encounter (Signed)
I have sent an email to charge correction to see if it was coded incorrectly or if Medicare just doesn't pay for this test.

## 2013-05-19 ENCOUNTER — Encounter: Payer: Self-pay | Admitting: Internal Medicine

## 2013-06-05 ENCOUNTER — Ambulatory Visit (INDEPENDENT_AMBULATORY_CARE_PROVIDER_SITE_OTHER): Payer: Medicare Other | Admitting: Internal Medicine

## 2013-06-05 ENCOUNTER — Encounter: Payer: Self-pay | Admitting: Internal Medicine

## 2013-06-05 VITALS — BP 130/70 | HR 74 | Temp 98.4°F | Ht 61.0 in | Wt 128.2 lb

## 2013-06-05 DIAGNOSIS — Z9109 Other allergy status, other than to drugs and biological substances: Secondary | ICD-10-CM

## 2013-06-05 DIAGNOSIS — I10 Essential (primary) hypertension: Secondary | ICD-10-CM

## 2013-06-05 DIAGNOSIS — Z23 Encounter for immunization: Secondary | ICD-10-CM

## 2013-06-05 DIAGNOSIS — E7800 Pure hypercholesterolemia, unspecified: Secondary | ICD-10-CM

## 2013-06-06 ENCOUNTER — Encounter: Payer: Self-pay | Admitting: Internal Medicine

## 2013-06-06 ENCOUNTER — Telehealth: Payer: Self-pay | Admitting: *Deleted

## 2013-06-06 NOTE — Telephone Encounter (Signed)
Pt presented to office complaining of left arm soreness, redness and mild swelling. Had Pneumovax injection yesterday. Advised pt to take Ibuprofen or Tylenol, to use warm compresses and to contact office if symptoms persist or worsen.  Advised pt that a reaction at the injection site can occur after immunizations. Pt denies any throat swelling, shortness of breath, or hives.

## 2013-06-06 NOTE — Progress Notes (Signed)
  Subjective:    Patient ID: Jamie Elliott, female    DOB: 1946/05/13, 67 y.o.   MRN: [id number]  HPI 67 year old female with past history of hypertension, hypercholesterolemia and allergies who comes in today for a scheduled follow up.  States she is doing well.  No chest pain or tightness.  No sob.  No acid reflux.  No nausea or vomiting.  No bowel change.  Was having problems with increased fatigue recently.  Rested this weekend.  Feels better.  Energy better.  No sinus symptoms.      Past Medical History  Diagnosis Date  . Hypertension   . Hypercholesterolemia   . Migraines   . Environmental allergies     Current Outpatient Prescriptions on File Prior to Visit  Medication Sig Dispense Refill  . aspirin 325 MG EC tablet Take 325 mg by mouth daily.      . Calcium Carbonate-Vitamin D (CALCIUM 600+D) 600-200 MG-UNIT TABS Take by mouth 2 (two) times daily.      . cetirizine (ZYRTEC) 10 MG tablet Take 1 tablet (10 mg total) by mouth daily.  30 tablet  5  . pravastatin (PRAVACHOL) 10 MG tablet Take 1 tablet (10 mg total) by mouth daily.  30 tablet  5  . triamcinolone (NASACORT) 55 MCG/ACT nasal inhaler Place 2 sprays into the nose as needed.  1 Inhaler  5  . valsartan-hydrochlorothiazide (DIOVAN-HCT) 160-12.5 MG per tablet Take 1 tablet by mouth daily.  30 tablet  5   No current facility-administered medications on file prior to visit.    Review of Systems Patient denies any headache, lightheadedness or dizziness.  No sinus or allergy symptoms.  No chest pain, tightness or palpitations.  No increased shortness of breath, cough or congestion.  No nausea or vomiting.  No acid reflux.  No abdominal pain or cramping.  No bowel change, such as diarrhea, constipation, BRBPR or melana.  No urine change.            Objective:   Physical Exam  Filed Vitals:   06/05/13 0903  BP: 130/70  Pulse: 74  Temp: 98.4 F (19.71 C)   67 year old female in no acute distress.   HEENT:  Nares- clear.   Oropharynx - without lesions. NECK:  Supple.  Nontender.  No audible bruit.  HEART:  Appears to be regular. LUNGS:  No crackles or wheezing audible.  Respirations even and unlabored.  RADIAL PULSE:  Equal bilaterally.  ABDOMEN:  Soft, nontender.  Bowel sounds present and normal.  No audible abdominal bruit.  EXTREMITIES:  No increased edema present.  DP pulses palpable and equal bilaterally.          Assessment & Plan:  FATIGUE.  Better.  Follow.   HEALTH MAINTENANCE.  Physical 04/05/13.  Pap at physical - ok.  Pneumovax given today.

## 2013-06-06 NOTE — Assessment & Plan Note (Signed)
Cholesterol still elevated on last check.  Now on pravastatin daily.  Tolerating.  Follow lipid panel and liver function.

## 2013-06-06 NOTE — Assessment & Plan Note (Signed)
Doing well. Continue saline nasal irrigation.  Follow.  Uses Zyrtec prn.  Currently doing well.  Follow.

## 2013-06-06 NOTE — Assessment & Plan Note (Signed)
Blood pressure as outlined.  Same medication regimen.  Follow.   

## 2013-07-05 ENCOUNTER — Ambulatory Visit: Payer: Medicare Other | Admitting: Internal Medicine

## 2013-08-07 ENCOUNTER — Other Ambulatory Visit (INDEPENDENT_AMBULATORY_CARE_PROVIDER_SITE_OTHER): Payer: Medicare Other

## 2013-08-07 DIAGNOSIS — E7800 Pure hypercholesterolemia, unspecified: Secondary | ICD-10-CM

## 2013-08-07 DIAGNOSIS — I10 Essential (primary) hypertension: Secondary | ICD-10-CM

## 2013-08-07 LAB — BASIC METABOLIC PANEL
CO2: 29 mEq/L (ref 19–32)
Chloride: 103 mEq/L (ref 96–112)
Sodium: 140 mEq/L (ref 135–145)

## 2013-08-07 LAB — LIPID PANEL
HDL: 52.9 mg/dL (ref >39.00)
Total CHOL/HDL Ratio: 4
Triglycerides: 148 mg/dL (ref 0.0–149.0)

## 2013-08-07 LAB — LDL CHOLESTEROL, DIRECT: Direct LDL: 154.2 mg/dL

## 2013-08-07 LAB — HEPATIC FUNCTION PANEL: Total Bilirubin: 0.6 mg/dL (ref 0.3–1.2)

## 2013-08-08 ENCOUNTER — Other Ambulatory Visit: Payer: Self-pay | Admitting: *Deleted

## 2013-08-08 MED ORDER — PRAVASTATIN SODIUM 20 MG PO TABS: 20.0000 mg | ORAL_TABLET | Freq: Every day | ORAL | Status: DC

## 2013-09-25 ENCOUNTER — Other Ambulatory Visit: Payer: Self-pay | Admitting: Internal Medicine

## 2013-09-25 MED ORDER — VALSARTAN-HYDROCHLOROTHIAZIDE 160-12.5 MG PO TABS: 1.0000 | ORAL_TABLET | Freq: Every day | ORAL | Status: DC

## 2013-09-25 MED ORDER — CETIRIZINE HCL 10 MG PO TABS: 10.0000 mg | ORAL_TABLET | Freq: Every day | ORAL | Status: DC

## 2013-09-25 NOTE — Progress Notes (Signed)
refillled valsartan/hctz and certrizine - #30 with 5 refills.

## 2013-10-05 ENCOUNTER — Encounter: Payer: Self-pay | Admitting: Internal Medicine

## 2013-10-05 ENCOUNTER — Ambulatory Visit (INDEPENDENT_AMBULATORY_CARE_PROVIDER_SITE_OTHER): Payer: Medicare Other | Admitting: Internal Medicine

## 2013-10-05 ENCOUNTER — Encounter (INDEPENDENT_AMBULATORY_CARE_PROVIDER_SITE_OTHER): Payer: Self-pay

## 2013-10-05 VITALS — BP 110/70 | HR 69 | Temp 98.0°F | Ht 61.0 in | Wt 129.8 lb

## 2013-10-05 DIAGNOSIS — IMO0001 Reserved for inherently not codable concepts without codable children: Secondary | ICD-10-CM

## 2013-10-05 DIAGNOSIS — M791 Myalgia, unspecified site: Secondary | ICD-10-CM

## 2013-10-05 DIAGNOSIS — Z9109 Other allergy status, other than to drugs and biological substances: Secondary | ICD-10-CM

## 2013-10-05 DIAGNOSIS — I10 Essential (primary) hypertension: Secondary | ICD-10-CM

## 2013-10-05 DIAGNOSIS — E7800 Pure hypercholesterolemia, unspecified: Secondary | ICD-10-CM

## 2013-10-12 ENCOUNTER — Encounter: Payer: Self-pay | Admitting: Internal Medicine

## 2013-10-12 NOTE — Assessment & Plan Note (Signed)
Blood pressure as outlined.  Same medication regimen.  Follow.  Have her spot check her pressures.  Follow metabolic panel.

## 2013-10-12 NOTE — Progress Notes (Signed)
  Subjective:    Patient ID: Jamie Elliott, female    DOB: 10/03/1946, 67 y.o.   MRN: [id number]  HPI 67 year old female with past history of hypertension, hypercholesterolemia and allergies who comes in today for a scheduled follow up.  States she is doing well.  No chest pain or tightness.  No sob.  No acid reflux.  No nausea or vomiting.  No bowel change. She does report having some increased low back pain and leg discomfort.  Started last week.  Some arm aching as well - right > left.   She has recently started cholesterol medication and was wondering if this could be contributing to her current symptoms.  No other triggers.  No sinus issues.  Weight is stable.      Past Medical History  Diagnosis Date  . Hypertension   . Hypercholesterolemia   . Migraines   . Environmental allergies     Current Outpatient Prescriptions on File Prior to Visit  Medication Sig Dispense Refill  . aspirin 325 MG EC tablet Take 325 mg by mouth daily.      . Calcium Carbonate-Vitamin D (CALCIUM 600+D) 600-200 MG-UNIT TABS Take by mouth 2 (two) times daily.      . cetirizine (ZYRTEC) 10 MG tablet Take 1 tablet (10 mg total) by mouth daily.  30 tablet  5  . pravastatin (PRAVACHOL) 20 MG tablet Take 1 tablet (20 mg total) by mouth daily.  30 tablet  5  . triamcinolone (NASACORT) 55 MCG/ACT nasal inhaler Place 2 sprays into the nose as needed.  1 Inhaler  5  . valsartan-hydrochlorothiazide (DIOVAN-HCT) 160-12.5 MG per tablet Take 1 tablet by mouth daily.  30 tablet  5   No current facility-administered medications on file prior to visit.    Review of Systems Patient denies any headache, lightheadedness or dizziness.  No sinus or allergy symptoms.  No chest pain, tightness or palpitations.  No increased shortness of breath, cough or congestion.  No nausea or vomiting.  No acid reflux.  No abdominal pain or cramping.  No bowel change, such as diarrhea, constipation, BRBPR or melana.  No urine change.  Back, legs and  arm aching as outlined.           Objective:   Physical Exam  Filed Vitals:   10/05/13 0933  BP: 110/70  Pulse: 69  Temp: 98 F (36.7 C)   Blood pressure recheck:  65/35  67 year old female in no acute distress.   HEENT:  Nares- clear.  Oropharynx - without lesions. NECK:  Supple.  Nontender.  No audible bruit.  HEART:  Appears to be regular. LUNGS:  No crackles or wheezing audible.  Respirations even and unlabored.  RADIAL PULSE:  Equal bilaterally.  ABDOMEN:  Soft, nontender.  Bowel sounds present and normal.  No audible abdominal bruit.  EXTREMITIES:  No increased edema present.  DP pulses palpable and equal bilaterally.      MSK:  No increased pain with straight leg raise.     Assessment & Plan:  FATIGUE.  Better.  Follow.   HEALTH MAINTENANCE.  Physical 04/05/13.  Pap at physical - ok.  Mammogram 11/16/12 - negative.

## 2013-10-12 NOTE — Assessment & Plan Note (Signed)
Describes the back, leg and arm aching.  Will stop pravastatin.  Call with update over the next 2-3 weeks.  Further w/up pending response.

## 2013-10-12 NOTE — Assessment & Plan Note (Signed)
Doing well. Continue saline nasal irrigation.  Follow.  Uses Zyrtec prn.  Currently doing well.  Follow.

## 2013-10-12 NOTE — Assessment & Plan Note (Addendum)
Cholesterol elevated on last check.  LDL - 154.  On pravastatin.  Will stop temporarily and see if her leg, arm and back aching resolved.  Follow closely.   Low cholesterol diet.   Follow lipid panel.

## 2013-10-22 ENCOUNTER — Encounter: Payer: Self-pay | Admitting: Internal Medicine

## 2013-11-27 ENCOUNTER — Telehealth: Payer: Self-pay | Admitting: *Deleted

## 2013-11-27 NOTE — Telephone Encounter (Signed)
Refill Request  Triamcinolone 55 mcg Nasal  316.50 GM  Please 2 sprays into the nose as needed

## 2013-12-11 ENCOUNTER — Other Ambulatory Visit (INDEPENDENT_AMBULATORY_CARE_PROVIDER_SITE_OTHER): Payer: Medicare Other

## 2013-12-11 DIAGNOSIS — I10 Essential (primary) hypertension: Secondary | ICD-10-CM

## 2013-12-11 DIAGNOSIS — E7800 Pure hypercholesterolemia, unspecified: Secondary | ICD-10-CM

## 2013-12-11 DIAGNOSIS — IMO0001 Reserved for inherently not codable concepts without codable children: Secondary | ICD-10-CM

## 2013-12-11 DIAGNOSIS — M791 Myalgia, unspecified site: Secondary | ICD-10-CM

## 2013-12-11 LAB — LDL CHOLESTEROL, DIRECT: Direct LDL: 225.9 mg/dL

## 2013-12-11 LAB — CBC WITH DIFFERENTIAL/PLATELET
Basophils Relative: 0.9 % (ref 0.0–3.0)
Eosinophils Absolute: 0.3 10*3/uL (ref 0.0–0.7)
Eosinophils Relative: 5.4 % — ABNORMAL HIGH (ref 0.0–5.0)
Lymphocytes Relative: 30 % (ref 12.0–46.0)
MCHC: 33.5 g/dL (ref 30.0–36.0)
Neutrophils Relative %: 57.1 % (ref 43.0–77.0)
RBC: 4.22 Mil/uL (ref 3.87–5.11)
WBC: 5.4 10*3/uL (ref 4.5–10.5)

## 2013-12-11 LAB — COMPREHENSIVE METABOLIC PANEL
ALT: 71 U/L — ABNORMAL HIGH (ref 0–35)
AST: 71 U/L — ABNORMAL HIGH (ref 0–37)
Albumin: 3.8 g/dL (ref 3.5–5.2)
BUN: 22 mg/dL (ref 6–23)
Calcium: 9.6 mg/dL (ref 8.4–10.5)
Chloride: 105 mEq/L (ref 96–112)
Potassium: 3.7 mEq/L (ref 3.5–5.1)
Sodium: 140 mEq/L (ref 135–145)
Total Protein: 6.8 g/dL (ref 6.0–8.3)

## 2013-12-11 LAB — HM MAMMOGRAPHY: HM Mammogram: NEGATIVE

## 2013-12-11 LAB — LIPID PANEL
Cholesterol: 303 mg/dL — ABNORMAL HIGH (ref 0–200)
Total CHOL/HDL Ratio: 6

## 2013-12-12 ENCOUNTER — Encounter: Payer: Self-pay | Admitting: Internal Medicine

## 2013-12-12 ENCOUNTER — Ambulatory Visit (INDEPENDENT_AMBULATORY_CARE_PROVIDER_SITE_OTHER): Payer: Medicare Other | Admitting: Internal Medicine

## 2013-12-12 VITALS — BP 140/80 | HR 71 | Temp 98.5°F | Ht 61.0 in | Wt 130.5 lb

## 2013-12-12 DIAGNOSIS — I10 Essential (primary) hypertension: Secondary | ICD-10-CM

## 2013-12-12 DIAGNOSIS — Z9109 Other allergy status, other than to drugs and biological substances: Secondary | ICD-10-CM

## 2013-12-12 DIAGNOSIS — E7800 Pure hypercholesterolemia, unspecified: Secondary | ICD-10-CM

## 2013-12-12 DIAGNOSIS — R945 Abnormal results of liver function studies: Secondary | ICD-10-CM

## 2013-12-12 DIAGNOSIS — R7989 Other specified abnormal findings of blood chemistry: Secondary | ICD-10-CM

## 2013-12-12 DIAGNOSIS — IMO0001 Reserved for inherently not codable concepts without codable children: Secondary | ICD-10-CM

## 2013-12-12 DIAGNOSIS — M791 Myalgia, unspecified site: Secondary | ICD-10-CM

## 2013-12-12 NOTE — Progress Notes (Signed)
Pre-visit discussion using our clinic review tool. No additional management support is needed unless otherwise documented below in the visit note.  

## 2013-12-17 ENCOUNTER — Encounter: Payer: Self-pay | Admitting: Internal Medicine

## 2013-12-17 DIAGNOSIS — R7989 Other specified abnormal findings of blood chemistry: Secondary | ICD-10-CM | POA: Insufficient documentation

## 2013-12-17 DIAGNOSIS — R945 Abnormal results of liver function studies: Secondary | ICD-10-CM | POA: Insufficient documentation

## 2013-12-17 NOTE — Assessment & Plan Note (Signed)
Recent liver check ast/alt elevated.  Will check abdominal ultrasound.  No other symptoms.  Follow liver panel.

## 2013-12-17 NOTE — Assessment & Plan Note (Signed)
Doing well.  Minimal nasal congestion at times.  Uses mucinex and saline nasal spray prn.  Follow.  Uses Zyrtec prn.  Currently doing well.  Follow.

## 2013-12-17 NOTE — Assessment & Plan Note (Signed)
Cholesterol elevated on last check.  LDL - 226.  Off pravastatin.  Feels better.  Discussed other treatment options.  Has not tried crestor.  Will start crestor 5mg  q mon, wed and Friday.  Samples given.   Low cholesterol diet.   Follow lipid panel and liver function.

## 2013-12-17 NOTE — Progress Notes (Signed)
  Subjective:    Patient ID: Jamie Elliott, female    DOB: 03/11/1946, 67 y.o.   MRN: [id number]  HPI 67 year old female with past history of hypertension, hypercholesterolemia and allergies who comes in today for a scheduled follow up.  States she is doing well.  No chest pain or tightness.  No sob.  No acid reflux.  No nausea or vomiting.  No bowel change.  No significant sinus issues.  Has had some congestion, but takes mucinex prn and uses saline.  This helps.  Has not needed abx lately.  Request refill for nasacort.  She had aching with pravastatin.  Cholesterol just checked and elevated.  We discussed other treatment options.  No nausea or vomiting.  No bowel change.  increased LFTs.  Discussed abdominal ultrasound.  She was in agreement.        Past Medical History  Diagnosis Date  . Hypertension   . Hypercholesterolemia   . Migraines   . Environmental allergies     Current Outpatient Prescriptions on File Prior to Visit  Medication Sig Dispense Refill  . aspirin 325 MG EC tablet Take 325 mg by mouth daily.      . Calcium Carbonate-Vitamin D (CALCIUM 600+D) 600-200 MG-UNIT TABS Take by mouth 2 (two) times daily.      . cetirizine (ZYRTEC) 10 MG tablet Take 1 tablet (10 mg total) by mouth daily.  30 tablet  5  . triamcinolone (NASACORT) 55 MCG/ACT nasal inhaler Place 2 sprays into the nose as needed.  1 Inhaler  5  . valsartan-hydrochlorothiazide (DIOVAN-HCT) 160-12.5 MG per tablet Take 1 tablet by mouth daily.  30 tablet  5   No current facility-administered medications on file prior to visit.    Review of Systems Patient denies any headache, lightheadedness or dizziness.  No significant sinus or allergy symptoms.  Some minimal congestion.  Takes mucinex and uses saline nasal spray.  No chest pain, tightness or palpitations.  No increased shortness of breath, cough or congestion.  No nausea or vomiting.  No acid reflux.  No abdominal pain or cramping.  No bowel change, such as  diarrhea, constipation, BRBPR or melana.  No urine change.  Feels better off pravastatin.           Objective:   Physical Exam  Filed Vitals:   12/12/13 1104  BP: 140/80  Pulse: 71  Temp: 98.5 F (36.9 C)   Blood pressure recheck:  21/62  67 year old female in no acute distress.   HEENT:  Nares- clear.  Oropharynx - without lesions. NECK:  Supple.  Nontender.  No audible bruit.  HEART:  Appears to be regular. LUNGS:  No crackles or wheezing audible.  Respirations even and unlabored.  RADIAL PULSE:  Equal bilaterally.  ABDOMEN:  Soft, nontender.  Bowel sounds present and normal.  No audible abdominal bruit.  EXTREMITIES:  No increased edema present.  DP pulses palpable and equal bilaterally.      Assessment & Plan:  HEALTH MAINTENANCE.  Physical 04/05/13.  Pap at physical - ok.  Mammogram 11/16/12 - negative.  Just had her mammogram at [facility].  States was ok.  Obtain results.

## 2013-12-17 NOTE — Assessment & Plan Note (Signed)
Better off statin medication.  Follow.

## 2013-12-17 NOTE — Assessment & Plan Note (Signed)
Blood pressure on her outside checks averaging 120-130s/60-70s.  Continue same medication regimen.  Follow.  Have her continue to spot check her pressures.  Follow metabolic panel.

## 2013-12-19 ENCOUNTER — Ambulatory Visit: Payer: Self-pay | Admitting: Internal Medicine

## 2013-12-20 ENCOUNTER — Other Ambulatory Visit (INDEPENDENT_AMBULATORY_CARE_PROVIDER_SITE_OTHER): Payer: Medicare Other

## 2013-12-20 DIAGNOSIS — R945 Abnormal results of liver function studies: Secondary | ICD-10-CM

## 2013-12-20 DIAGNOSIS — R7989 Other specified abnormal findings of blood chemistry: Secondary | ICD-10-CM

## 2013-12-20 LAB — HEPATIC FUNCTION PANEL
Bilirubin, Direct: 0 mg/dL (ref 0.0–0.3)
Total Bilirubin: 0.5 mg/dL (ref 0.3–1.2)

## 2013-12-22 ENCOUNTER — Telehealth: Payer: Self-pay | Admitting: Internal Medicine

## 2013-12-22 NOTE — Telephone Encounter (Signed)
Opened in error

## 2013-12-25 ENCOUNTER — Encounter: Payer: Self-pay | Admitting: Internal Medicine

## 2014-01-01 ENCOUNTER — Ambulatory Visit (INDEPENDENT_AMBULATORY_CARE_PROVIDER_SITE_OTHER): Payer: Medicare Other | Admitting: Internal Medicine

## 2014-01-01 ENCOUNTER — Encounter: Payer: Self-pay | Admitting: Internal Medicine

## 2014-01-01 VITALS — BP 120/80 | HR 73 | Temp 98.2°F | Ht 61.0 in | Wt 132.0 lb

## 2014-01-01 DIAGNOSIS — R945 Abnormal results of liver function studies: Secondary | ICD-10-CM

## 2014-01-01 DIAGNOSIS — E7800 Pure hypercholesterolemia, unspecified: Secondary | ICD-10-CM

## 2014-01-01 DIAGNOSIS — I10 Essential (primary) hypertension: Secondary | ICD-10-CM

## 2014-01-01 DIAGNOSIS — Z9109 Other allergy status, other than to drugs and biological substances: Secondary | ICD-10-CM

## 2014-01-01 DIAGNOSIS — R7989 Other specified abnormal findings of blood chemistry: Secondary | ICD-10-CM

## 2014-01-01 NOTE — Progress Notes (Signed)
  Subjective:    Patient ID: Jamie Elliott, female    DOB: [birth year], 68 y.o.   MRN: [id number]  HPI 68 year old female with past history of hypertension, hypercholesterolemia and allergies who comes in today for a scheduled follow up.  States she is doing well.  No chest pain or tightness.  No sob.  No acid reflux.  No nausea or vomiting.  No bowel change.  No significant sinus issues.  Taking an increased amount of mucinex daily.   Has not needed abx lately.  She does report increased blood pressure recently.  Also has noticed some light headedness.  She was questioning if the mucinex was causing these symptoms.  She did not take the mucinex yesterday and her blood pressure was better and the dizziness was not an issue.  No nausea or vomiting.  No bowel change.  Increased LFTs.  Abdominal ultrasound revealed a gallstone.  No other abnormality. Discussed further w/up.  Will follow.  Most recent liver panel wnl.     Past Medical History  Diagnosis Date  . Hypertension   . Hypercholesterolemia   . Migraines   . Environmental allergies     Current Outpatient Prescriptions on File Prior to Visit  Medication Sig Dispense Refill  . aspirin 325 MG EC tablet Take 325 mg by mouth daily.      . Calcium Carbonate-Vitamin D (CALCIUM 600+D) 600-200 MG-UNIT TABS Take by mouth 2 (two) times daily.      . cetirizine (ZYRTEC) 10 MG tablet Take 1 tablet (10 mg total) by mouth daily.  30 tablet  5  . Omega-3 Fatty Acids (FISH OIL) 1000 MG CAPS Take by mouth 2 (two) times daily.      Marland Kitchen triamcinolone (NASACORT) 55 MCG/ACT nasal inhaler Place 2 sprays into the nose as needed.  1 Inhaler  5  . valsartan-hydrochlorothiazide (DIOVAN-HCT) 160-12.5 MG per tablet Take 1 tablet by mouth daily.  30 tablet  5   No current facility-administered medications on file prior to visit.    Review of Systems Patient denies any headache.  Some light headedness.  No significant sinus or allergy symptoms.  Taking an increased  amount of mucinex.  See above.   No chest pain, tightness or palpitations.  No increased shortness of breath, cough or congestion.  No nausea or vomiting.  No acid reflux.  No abdominal pain or cramping.  No bowel change, such as diarrhea, constipation, BRBPR or melana.  No urine change.  Recent liver panel improved.           Objective:   Physical Exam  Filed Vitals:   01/01/14 1102  BP: 120/80  Pulse: 73  Temp: 98.2 F (36.8 C)   Blood pressure recheck:  49/65  68 year old female in no acute distress.   HEENT:  Nares- clear.  Oropharynx - without lesions. NECK:  Supple.  Nontender.  No audible bruit.  HEART:  Appears to be regular. LUNGS:  No crackles or wheezing audible.  Respirations even and unlabored.  RADIAL PULSE:  Equal bilaterally.  ABDOMEN:  Soft, nontender.  Bowel sounds present and normal.  No audible abdominal bruit.  EXTREMITIES:  No increased edema present.  DP pulses palpable and equal bilaterally.      Assessment & Plan:  HEALTH MAINTENANCE.  Physical 04/05/13.  Pap at physical - ok.  Mammogram 11/16/12 - negative.  Just had her mammogram at [facility].  States was ok.  Performed 12/12/13.

## 2014-01-01 NOTE — Progress Notes (Signed)
Pre-visit discussion using our clinic review tool. No additional management support is needed unless otherwise documented below in the visit note.  

## 2014-01-03 ENCOUNTER — Encounter: Payer: Self-pay | Admitting: Internal Medicine

## 2014-01-03 NOTE — Assessment & Plan Note (Signed)
Doing well.  Follow.  

## 2014-01-03 NOTE — Assessment & Plan Note (Addendum)
Last liver panel improved.  Follow.  Abdominal ultrasound ok except for gallstone.  Currently asymptomatic.  Follow.   

## 2014-01-03 NOTE — Assessment & Plan Note (Addendum)
Blood pressure as outlined.  Has been elevated recently.  Better off mucinex.  Will stop the mucinex.  Follow pressures.  See if the light headedness resolves.  Continue same medication regimen.  Follow.  Have her continue to spot check her pressures.  Follow metabolic panel.

## 2014-01-03 NOTE — Assessment & Plan Note (Addendum)
Low cholesterol diet and exercise.  Follow lipid panel.   

## 2014-01-16 ENCOUNTER — Encounter: Payer: Self-pay | Admitting: Internal Medicine

## 2014-01-17 ENCOUNTER — Ambulatory Visit (INDEPENDENT_AMBULATORY_CARE_PROVIDER_SITE_OTHER): Payer: Medicare Other | Admitting: Internal Medicine

## 2014-01-17 ENCOUNTER — Encounter: Payer: Self-pay | Admitting: Internal Medicine

## 2014-01-17 VITALS — BP 118/62 | HR 69 | Temp 97.6°F | Resp 12 | Ht 61.0 in | Wt 129.0 lb

## 2014-01-17 DIAGNOSIS — I10 Essential (primary) hypertension: Secondary | ICD-10-CM

## 2014-01-17 DIAGNOSIS — R945 Abnormal results of liver function studies: Secondary | ICD-10-CM

## 2014-01-17 DIAGNOSIS — E7800 Pure hypercholesterolemia, unspecified: Secondary | ICD-10-CM

## 2014-01-17 DIAGNOSIS — Z9109 Other allergy status, other than to drugs and biological substances: Secondary | ICD-10-CM

## 2014-01-17 DIAGNOSIS — R7989 Other specified abnormal findings of blood chemistry: Secondary | ICD-10-CM

## 2014-01-17 NOTE — Progress Notes (Signed)
Pre visit review using our clinic review tool, if applicable. No additional management support is needed unless otherwise documented below in the visit note. 

## 2014-01-21 ENCOUNTER — Encounter: Payer: Self-pay | Admitting: Internal Medicine

## 2014-01-21 NOTE — Assessment & Plan Note (Signed)
Last liver panel improved.  Follow.  Abdominal ultrasound ok except for gallstone.  Currently asymptomatic.  Follow.

## 2014-01-21 NOTE — Assessment & Plan Note (Signed)
Blood pressure as outlined.  Doing better.  Follow pressures.  Follow metabolic panel.   

## 2014-01-21 NOTE — Progress Notes (Signed)
  Subjective:    Patient ID: Jamie Elliott, female    DOB: [birth year], 68 y.o.   MRN: [id number]  HPI 68 year old female with past history of hypertension, hypercholesterolemia and allergies who comes in today for a scheduled follow up.  States she is doing well.  No chest pain or tightness.  No sob.  No acid reflux.  No nausea or vomiting.  No bowel change.  No significant sinus issues.  Was taking an increased amount of mucinex daily.  She has stopped now.   Has not needed abx lately.  Blood pressure has been doing better since stopping all of the otc cold medications.  No nausea or vomiting.  No bowel change.  Increased LFTs.  Abdominal ultrasound revealed a gallstone.  No other abnormality. Discussed further w/up.  Will follow.  Most recent liver panel improved.      Past Medical History  Diagnosis Date  . Hypertension   . Hypercholesterolemia   . Migraines   . Environmental allergies     Current Outpatient Prescriptions on File Prior to Visit  Medication Sig Dispense Refill  . aspirin 325 MG EC tablet Take 325 mg by mouth daily.      . Calcium Carbonate-Vitamin D (CALCIUM 600+D) 600-200 MG-UNIT TABS Take by mouth 2 (two) times daily.      . cetirizine (ZYRTEC) 10 MG tablet Take 1 tablet (10 mg total) by mouth daily.  30 tablet  5  . Omega-3 Fatty Acids (FISH OIL) 1000 MG CAPS Take by mouth 2 (two) times daily.      Marland Kitchen triamcinolone (NASACORT) 55 MCG/ACT nasal inhaler Place 2 sprays into the nose as needed.  1 Inhaler  5  . valsartan-hydrochlorothiazide (DIOVAN-HCT) 160-12.5 MG per tablet Take 1 tablet by mouth daily.  30 tablet  5   No current facility-administered medications on file prior to visit.    Review of Systems Patient denies any headache.  No light headedness.  No significant sinus or allergy symptoms.  Was taking an increased amount of mucinex.  See last note.  Stopped.  No chest pain, tightness or palpitations.  No increased shortness of breath, cough or congestion.  No  nausea or vomiting.  No acid reflux.  No abdominal pain or cramping.  No bowel change, such as diarrhea, constipation, BRBPR or melana.  No urine change.  Recent liver panel improved.   Feels better.  Blood pressure doing well.          Objective:   Physical Exam  Filed Vitals:   01/17/14 1121  BP: 118/62  Pulse: 69  Temp: 97.6 F (36.4 C)  Resp: 12   Blood pressure recheck:  41/36  68 year old female in no acute distress.   HEENT:  Nares- clear.  Oropharynx - without lesions. NECK:  Supple.  Nontender.  No audible bruit.  HEART:  Appears to be regular. LUNGS:  No crackles or wheezing audible.  Respirations even and unlabored.  RADIAL PULSE:  Equal bilaterally.  ABDOMEN:  Soft, nontender.  Bowel sounds present and normal.  No audible abdominal bruit.  EXTREMITIES:  No increased edema present.  DP pulses palpable and equal bilaterally.      Assessment & Plan:  HEALTH MAINTENANCE.  Physical 04/05/13.  Pap at physical - ok.  Mammogram 11/16/12 - negative.  Just had her mammogram at [facility].  States was ok.  Performed 12/12/13.

## 2014-01-21 NOTE — Assessment & Plan Note (Signed)
Low cholesterol diet and exercise.  Follow lipid panel.   

## 2014-01-21 NOTE — Assessment & Plan Note (Signed)
Doing well.  Follow.  

## 2014-03-22 ENCOUNTER — Encounter: Payer: Self-pay | Admitting: Adult Health

## 2014-03-22 ENCOUNTER — Ambulatory Visit (INDEPENDENT_AMBULATORY_CARE_PROVIDER_SITE_OTHER): Payer: Medicare Other | Admitting: Adult Health

## 2014-03-22 VITALS — BP 126/72 | HR 76 | Temp 98.1°F | Resp 12 | Wt 133.0 lb

## 2014-03-22 DIAGNOSIS — J329 Chronic sinusitis, unspecified: Secondary | ICD-10-CM

## 2014-03-22 MED ORDER — LEVOFLOXACIN 500 MG PO TABS: 500.0000 mg | ORAL_TABLET | Freq: Every day | ORAL | Status: DC

## 2014-03-22 NOTE — Progress Notes (Signed)
Pre visit review using our clinic review tool, if applicable. No additional management support is needed unless otherwise documented below in the visit note. 

## 2014-03-22 NOTE — Patient Instructions (Signed)
  Start Levaquin 500 mg daily for 7 days.  Continue to take your zyrtec and use the nasonex.  Saline spray as often as you like.

## 2014-03-22 NOTE — Progress Notes (Signed)
Patient ID: Jamie Elliott, female   DOB: 1946/06/10, 69 y.o.   MRN: [id number]   Subjective:    Patient ID: Jamie Elliott, female    DOB: [birth year], 68 y.o.   MRN: [id number]  HPI  Pt is a 69 y/o female with sinus pressure, drainage. She has a hx of sinus problems. Reports they are getting better over time. On Nasonex and zyrtec. She is concerned because she is going on a [employer] on Monday with her grandchildren and she doesn't want to be sick for the trip.   Past Medical History  Diagnosis Date  . Hypertension   . Hypercholesterolemia   . Migraines   . Environmental allergies     Current Outpatient Prescriptions on File Prior to Visit  Medication Sig Dispense Refill  . aspirin 325 MG EC tablet Take 325 mg by mouth daily.      . Calcium Carbonate-Vitamin D (CALCIUM 600+D) 600-200 MG-UNIT TABS Take by mouth 2 (two) times daily.      . cetirizine (ZYRTEC) 10 MG tablet Take 1 tablet (10 mg total) by mouth daily.  30 tablet  5  . Omega-3 Fatty Acids (FISH OIL) 1000 MG CAPS Take by mouth 2 (two) times daily.      . rosuvastatin (CRESTOR) 5 MG tablet Take 5 mg by mouth. Monday, Wednesday, and Friday      . triamcinolone (NASACORT) 55 MCG/ACT nasal inhaler Place 2 sprays into the nose as needed.  1 Inhaler  5  . valsartan-hydrochlorothiazide (DIOVAN-HCT) 160-12.5 MG per tablet Take 1 tablet by mouth daily.  30 tablet  5   No current facility-administered medications on file prior to visit.     Review of Systems  Constitutional: Negative for fever and chills.  HENT: Positive for congestion, postnasal drip, rhinorrhea and sinus pressure. Negative for sore throat.   Respiratory: Positive for cough.   All other systems reviewed and are negative.       Objective:  BP 126/72  Pulse 76  Temp(Src) 98.1 F (36.7 C) (Oral)  Resp 12  Wt 133 lb (60.328 kg)  SpO2 97%  LMP 11/28/1998   Physical Exam  Constitutional: She is oriented to person, place, and time. She appears  well-developed and well-nourished. No distress.  HENT:  Head: Normocephalic and atraumatic.  Right Ear: External ear normal.  Left Ear: External ear normal.  Mouth/Throat: No oropharyngeal exudate.  Cardiovascular: Normal rate, regular rhythm and normal heart sounds.  Exam reveals no gallop.   No murmur heard. Pulmonary/Chest: Effort normal and breath sounds normal. No respiratory distress. She has no wheezes. She has no rales.  Lymphadenopathy:    She has cervical adenopathy.  Neurological: She is alert and oriented to person, place, and time.  Psychiatric: She has a normal mood and affect. Her behavior is normal. Judgment and thought content normal.       Assessment & Plan:   1. Sinusitis Will treat empirically for sinus infection given that patient will be leaving on vacation. Start Levaquin and directed. Continue nasonex

## 2014-04-13 ENCOUNTER — Other Ambulatory Visit (INDEPENDENT_AMBULATORY_CARE_PROVIDER_SITE_OTHER): Payer: Medicare Other

## 2014-04-13 DIAGNOSIS — R945 Abnormal results of liver function studies: Secondary | ICD-10-CM

## 2014-04-13 DIAGNOSIS — E7800 Pure hypercholesterolemia, unspecified: Secondary | ICD-10-CM

## 2014-04-13 DIAGNOSIS — R7989 Other specified abnormal findings of blood chemistry: Secondary | ICD-10-CM

## 2014-04-13 DIAGNOSIS — I10 Essential (primary) hypertension: Secondary | ICD-10-CM

## 2014-04-13 LAB — LIPID PANEL
CHOL/HDL RATIO: 5
Cholesterol: 241 mg/dL — ABNORMAL HIGH (ref 0–200)
HDL: 52.2 mg/dL (ref >39.00)
LDL CALC: 143 mg/dL — AB (ref 0–99)
Triglycerides: 227 mg/dL — ABNORMAL HIGH (ref 0.0–149.0)
VLDL: 45.4 mg/dL — ABNORMAL HIGH (ref 0.0–40.0)

## 2014-04-13 LAB — HEPATIC FUNCTION PANEL
ALT: 18 U/L (ref 0–35)
AST: 22 U/L (ref 0–37)
Albumin: 3.7 g/dL (ref 3.5–5.2)
Alkaline Phosphatase: 67 U/L (ref 39–117)
BILIRUBIN DIRECT: 0 mg/dL (ref 0.0–0.3)
BILIRUBIN TOTAL: 0.5 mg/dL (ref 0.3–1.2)
TOTAL PROTEIN: 6.9 g/dL (ref 6.0–8.3)

## 2014-04-13 LAB — BASIC METABOLIC PANEL
BUN: 15 mg/dL (ref 6–23)
CHLORIDE: 105 meq/L (ref 96–112)
CO2: 29 meq/L (ref 19–32)
CREATININE: 0.8 mg/dL (ref 0.4–1.2)
Calcium: 10 mg/dL (ref 8.4–10.5)
GFR: 73.64 mL/min (ref >60.00)
Glucose, Bld: 79 mg/dL (ref 70–99)
POTASSIUM: 3.6 meq/L (ref 3.5–5.1)
Sodium: 139 mEq/L (ref 135–145)

## 2014-04-16 ENCOUNTER — Encounter: Payer: Self-pay | Admitting: Internal Medicine

## 2014-04-16 ENCOUNTER — Ambulatory Visit (INDEPENDENT_AMBULATORY_CARE_PROVIDER_SITE_OTHER): Payer: Medicare Other | Admitting: Internal Medicine

## 2014-04-16 VITALS — BP 130/70 | HR 98 | Temp 98.6°F | Ht 61.5 in | Wt 130.8 lb

## 2014-04-16 DIAGNOSIS — R7989 Other specified abnormal findings of blood chemistry: Secondary | ICD-10-CM

## 2014-04-16 DIAGNOSIS — E7800 Pure hypercholesterolemia, unspecified: Secondary | ICD-10-CM

## 2014-04-16 DIAGNOSIS — M79609 Pain in unspecified limb: Secondary | ICD-10-CM

## 2014-04-16 DIAGNOSIS — I10 Essential (primary) hypertension: Secondary | ICD-10-CM

## 2014-04-16 DIAGNOSIS — R945 Abnormal results of liver function studies: Secondary | ICD-10-CM

## 2014-04-16 DIAGNOSIS — M79601 Pain in right arm: Secondary | ICD-10-CM

## 2014-04-16 DIAGNOSIS — M810 Age-related osteoporosis without current pathological fracture: Secondary | ICD-10-CM

## 2014-04-16 DIAGNOSIS — Z9109 Other allergy status, other than to drugs and biological substances: Secondary | ICD-10-CM

## 2014-04-16 NOTE — Progress Notes (Signed)
Pre visit review using our clinic review tool, if applicable. No additional management support is needed unless otherwise documented below in the visit note. 

## 2014-04-16 NOTE — Progress Notes (Signed)
Subjective:    Patient ID: Jamie Elliott, female    DOB: [birth year], 68 y.o.   MRN: [id number]  HPI 68 year old female with past history of hypertension, hypercholesterolemia and allergies who comes in today to follow up on these issues as well as for a complete physical exam.   States she is doing well.  No chest pain or tightness.  No sob.  No acid reflux.  No nausea or vomiting.  No bowel change.  No significant sinus issues now.  Some allergy symptoms with some increased nasal dripping and drainage. Taking zyrtec.   No nausea or vomiting.  No bowel change.  Increased LFTs. Abdominal ultrasound revealed a gallstone.  No other abnormality.  Have discussed further w/up.  Will follow.  Most recent liver panel improved.  She does report increased right arm pain.  States she went to pick up her granddaughter.  Feels may have pulled a muscle.  Uses her arms a lot.  She is back on crestor.  Off for two weeks.  Appears to be tolerating.  States her blood pressure is averaging 130-135/80s.      Past Medical History  Diagnosis Date  . Hypertension   . Hypercholesterolemia   . Migraines   . Environmental allergies     Current Outpatient Prescriptions on File Prior to Visit  Medication Sig Dispense Refill  . aspirin 325 MG EC tablet Take 325 mg by mouth daily.      . Calcium Carbonate-Vitamin D (CALCIUM 600+D) 600-200 MG-UNIT TABS Take by mouth 2 (two) times daily.      . cetirizine (ZYRTEC) 10 MG tablet Take 1 tablet (10 mg total) by mouth daily.  30 tablet  5  . Omega-3 Fatty Acids (FISH OIL) 1000 MG CAPS Take by mouth 2 (two) times daily.      . rosuvastatin (CRESTOR) 5 MG tablet Take 5 mg by mouth. Monday, Wednesday, and Friday      . triamcinolone (NASACORT) 55 MCG/ACT nasal inhaler Place 2 sprays into the nose as needed.  1 Inhaler  5  . valsartan-hydrochlorothiazide (DIOVAN-HCT) 160-12.5 MG per tablet Take 1 tablet by mouth daily.  30 tablet  5   No current facility-administered medications on  file prior to visit.    Review of Systems Patient denies any headache.  No light headedness.  Some allergy symptoms.  Not severe.  No chest pain, tightness or palpitations.  No increased shortness of breath, cough or congestion.   No nausea or vomiting.  No acid reflux.  No abdominal pain or cramping.  No bowel change, such as diarrhea, constipation, BRBPR or melana.  No urine change.  Recent liver panel improved.   Feels better.  Blood pressure doing well.  She did hurt her right arm as outlined.  Sore - upper arm and fore arm.  Is better.          Objective:   Physical Exam  Filed Vitals:   04/16/14 0928  BP: 130/70  Pulse: 98  Temp: 98.6 F (37 C)   Blood pressure recheck:  114/74, pulse 1  68 year old female in no acute distress.   HEENT:  Nares- clear.  Oropharynx - without lesions. NECK:  Supple.  Nontender.  No audible bruit.  HEART:  Appears to be regular. LUNGS:  No crackles or wheezing audible.  Respirations even and unlabored.  RADIAL PULSE:  Equal bilaterally.    BREASTS:  No nipple discharge or nipple retraction present.  Could not appreciate  any distinct nodules or axillary adenopathy.  ABDOMEN:  Soft, nontender.  Bowel sounds present and normal.  No audible abdominal bruit.  GU:  Not performed.    EXTREMITIES:  No increased edema present.  DP pulses palpable and equal bilaterally.      MSK:  Some increased pain with palpation over the upper right arm.  No significant pain with full extension of her right arm.     Assessment & Plan:  HEALTH MAINTENANCE.  Physical today.  Pap 04/05/13 - ok.  Mammogram 11/16/12 - negative.  Just had her mammogram at [facility].  States was ok.  Performed 12/11/13.      I spent 25 minutes with the patient and more than 50% of the time was spent in consultation regarding the above.

## 2014-04-17 DIAGNOSIS — M810 Age-related osteoporosis without current pathological fracture: Secondary | ICD-10-CM | POA: Insufficient documentation

## 2014-04-17 NOTE — Assessment & Plan Note (Signed)
Last liver panel improved.  Follow.  Abdominal ultrasound ok except for gallstone.  Currently asymptomatic.  Follow.

## 2014-04-17 NOTE — Assessment & Plan Note (Signed)
Blood pressure as outlined.  Doing better.  Follow pressures.  Follow metabolic panel.   

## 2014-04-17 NOTE — Assessment & Plan Note (Signed)
Doing well.  Follow.  Some allergy symptoms now.  Continue zyrtec, saline nasal spray and nasacort.  Follow.

## 2014-04-17 NOTE — Assessment & Plan Note (Signed)
Low cholesterol diet and exercise.  Follow lipid panel.  Lipid panel just checked and revealed improvement. Total cholesterol 241, triglycerides 227, HDL 52 and LDL 143.  Will continue low dose crestor as she is doing.  Follow

## 2014-04-17 NOTE — Assessment & Plan Note (Signed)
Was on a bisphosphonate for a while.  Off for a while now.  Has not had a bone density recently.  Schedule.

## 2014-04-17 NOTE — Assessment & Plan Note (Signed)
Arm papin as outlined.  Improving.  Feel probable muscle strain.  Avoid strenuous lifting.  Follow.

## 2014-04-30 ENCOUNTER — Telehealth: Payer: Self-pay | Admitting: Internal Medicine

## 2014-04-30 ENCOUNTER — Other Ambulatory Visit: Payer: Self-pay | Admitting: *Deleted

## 2014-04-30 MED ORDER — VALSARTAN-HYDROCHLOROTHIAZIDE 160-12.5 MG PO TABS: 1.0000 | ORAL_TABLET | Freq: Every day | ORAL | Status: DC

## 2014-04-30 NOTE — Telephone Encounter (Signed)
Notify pt that her bone density reveals osteopenia.  Does not meet criteria for osteoporosis.  Can schedule an appt to discuss treatment options.

## 2014-04-30 NOTE — Telephone Encounter (Signed)
Notify pt that I received her bone density report and her bone density reveals osteopenia.  Does not meet criteria for osteoporosis.

## 2014-05-01 ENCOUNTER — Encounter: Payer: Self-pay | Admitting: *Deleted

## 2014-05-01 NOTE — Telephone Encounter (Signed)
Mailed letter °

## 2014-05-01 NOTE — Telephone Encounter (Signed)
Left message for pt to return my call.

## 2014-05-03 ENCOUNTER — Telehealth: Payer: Self-pay | Admitting: Internal Medicine

## 2014-05-03 NOTE — Telephone Encounter (Signed)
LMTCB also notifed pt on voicemail that letter was mailed on 05/01/14

## 2014-05-03 NOTE — Telephone Encounter (Signed)
Pt came in today wanting to get her results to her bone density

## 2014-05-21 ENCOUNTER — Encounter: Payer: Self-pay | Admitting: Internal Medicine

## 2014-05-21 ENCOUNTER — Ambulatory Visit (INDEPENDENT_AMBULATORY_CARE_PROVIDER_SITE_OTHER): Payer: Medicare Other | Admitting: Internal Medicine

## 2014-05-21 VITALS — BP 116/76 | HR 76 | Temp 98.5°F | Wt 130.0 lb

## 2014-05-21 DIAGNOSIS — J019 Acute sinusitis, unspecified: Secondary | ICD-10-CM

## 2014-05-21 MED ORDER — CEFUROXIME AXETIL 500 MG PO TABS: 500.0000 mg | ORAL_TABLET | Freq: Two times a day (BID) | ORAL | Status: DC

## 2014-05-21 NOTE — Patient Instructions (Addendum)

## 2014-05-21 NOTE — Progress Notes (Signed)
Pre visit review using our clinic review tool, if applicable. No additional management support is needed unless otherwise documented below in the visit note. 

## 2014-05-21 NOTE — Progress Notes (Signed)
HPI  Pt presents to the clinic today with c/o headache, facial pain and pressure, ear fullness, sore throat and cough. She reports this started 1 week ago. She is blowing thick yellow mucous out of her nose. She denies fever and chills but has had fatigue and body aches. She has tried benadryl, nasocort, zyrtec and saline spray without relief. She has not had sick contacts. She is not a smoker. She does have a history of bad environmental allergies.  Review of Systems    Past Medical History  Diagnosis Date  . Hypertension   . Hypercholesterolemia   . Migraines   . Environmental allergies     Family History  Problem Relation Age of Onset  . Heart disease Mother   . Hyperlipidemia Mother   . Hypertension Mother   . Cancer Father     lung, prostate  . Hyperlipidemia Father   . Hypertension Father     History   Social History  . Marital Status: Married    Spouse Name: N/A    Number of Children: 2  . Years of Education: N/A   Occupational History  . Not on file.   Social History Main Topics  . Smoking status: Never Smoker   . Smokeless tobacco: Never Used  . Alcohol Use: No  . Drug Use: No  . Sexual Activity: Not on file   Other Topics Concern  . Not on file   Social History Narrative  . No narrative on file    Allergies  Allergen Reactions  . Zithromax [Azithromycin]   . Augmentin [Amoxicillin-Pot Clavulanate] Rash     Constitutional: Positive headache, fatigue. Denies fever or  abrupt weight changes.  HEENT:  Positive eye pain, pressure behind the eyes, facial pain, nasal congestion and sore throat. Denies eye redness, ear pain, ringing in the ears, wax buildup, runny nose or bloody nose. Respiratory: Positive cough. Denies difficulty breathing or shortness of breath.  Cardiovascular: Denies chest pain, chest tightness, palpitations or swelling in the hands or feet.   No other specific complaints in a complete review of systems (except as listed in HPI  above).  Objective:  BP 116/76  Pulse 76  Temp(Src) 98.5 F (36.9 C) (Oral)  Wt 130 lb (58.968 kg)  SpO2 98%  LMP 11/28/1998   General: Appears herstated age, well developed, well nourished in NAD. HEENT: Head: normal shape and size, frontal sinus tenderness noted; Eyes: sclera white, no icterus, conjunctiva pink, PERRLA and EOMs intact; Ears: Tm's gray and intact, normal light reflex; Nose: mucosa pink and moist, septum midline; Throat/Mouth: + PND. Teeth present, mucosa pink and moist, no exudate noted, no lesions or ulcerations noted.  Neck: Neck supple, trachea midline. No massses, lumps or thyromegaly present.  Cardiovascular: Normal rate and rhythm. S1,S2 noted.  No murmur, rubs or gallops noted. No JVD or BLE edema. No carotid bruits noted. Pulmonary/Chest: Normal effort and positive vesicular breath sounds. No respiratory distress. No wheezes, rales or ronchi noted.      Assessment & Plan:   Acute bacterial sinusitis  Can use a Neti Pot which can be purchased from your local drug store. Continue zyrtec and nasocort Ceftrin BID for 10 days  RTC as needed or if symptoms persist.

## 2014-05-23 ENCOUNTER — Encounter: Payer: Self-pay | Admitting: Internal Medicine

## 2014-05-24 ENCOUNTER — Telehealth: Payer: Self-pay

## 2014-05-24 ENCOUNTER — Telehealth: Payer: Self-pay | Admitting: Internal Medicine

## 2014-05-24 NOTE — Telephone Encounter (Signed)
Pt was seen on 05/21/14; pt is taking Ceftin,zyrtec,nasocort and using netipot with no improvement; pt continues with dizziness, head congestion, h/a and ears feel full. Pt wants to know when she should see improvement. Pt does not want to schedule another appt yet and did not call PCP since she saw Webb Silversmith NP. [address]. Pt request cb.

## 2014-05-24 NOTE — Telephone Encounter (Signed)
Advise her to wait another 24 hours. If no improvement, call back and I can add prednisone to her regimen

## 2014-05-24 NOTE — Telephone Encounter (Signed)
lmovm for pt to return call.  

## 2014-05-24 NOTE — Telephone Encounter (Signed)
Patient returned your call.  You can try her on her cell phone, if you can't reach her there, call her on her cell phone.

## 2014-05-24 NOTE — Telephone Encounter (Signed)
Advised pt if no improvement in 24 hrs then she needs to call on 05/25/14 and Rollene Fare B can add prednisone. Pt voiced understanding.

## 2014-05-24 NOTE — Telephone Encounter (Signed)
Pt called per Morey Hummingbird E returning my call--i called pt and lmovm for her to return my call

## 2014-05-25 ENCOUNTER — Other Ambulatory Visit: Payer: Self-pay | Admitting: Internal Medicine

## 2014-05-25 MED ORDER — PREDNISONE 10 MG PO TABS: ORAL_TABLET | ORAL | Status: DC

## 2014-05-25 NOTE — Telephone Encounter (Signed)
lmovm for pt to return call.  

## 2014-05-25 NOTE — Telephone Encounter (Addendum)
Pt left v/m; pt still is no better, pt still dizzy and head congested and pt request prednisone sent to [facility].pt request cb when med sent to pharmacy.

## 2014-05-25 NOTE — Telephone Encounter (Signed)
pred taper sent to requested pharmacy

## 2014-05-25 NOTE — Telephone Encounter (Signed)
LMOVM FOR PT TO RETURN CALL

## 2014-05-25 NOTE — Telephone Encounter (Signed)
Patient returned your call.

## 2014-05-29 ENCOUNTER — Ambulatory Visit: Payer: Medicare Other | Admitting: Internal Medicine

## 2014-05-29 NOTE — Telephone Encounter (Signed)
Called pharmacy and confirmed that pt picked up Rx sent to pharmacy-- have been unsuccessful in reaching pt via phone

## 2014-05-30 ENCOUNTER — Other Ambulatory Visit: Payer: Self-pay | Admitting: *Deleted

## 2014-05-30 MED ORDER — CETIRIZINE HCL 10 MG PO TABS: 10.0000 mg | ORAL_TABLET | Freq: Every day | ORAL | Status: DC

## 2014-08-14 ENCOUNTER — Other Ambulatory Visit (INDEPENDENT_AMBULATORY_CARE_PROVIDER_SITE_OTHER): Payer: Medicare Other

## 2014-08-14 DIAGNOSIS — M810 Age-related osteoporosis without current pathological fracture: Secondary | ICD-10-CM

## 2014-08-14 DIAGNOSIS — R7989 Other specified abnormal findings of blood chemistry: Secondary | ICD-10-CM

## 2014-08-14 DIAGNOSIS — E7800 Pure hypercholesterolemia, unspecified: Secondary | ICD-10-CM

## 2014-08-14 DIAGNOSIS — I10 Essential (primary) hypertension: Secondary | ICD-10-CM

## 2014-08-14 LAB — HEPATIC FUNCTION PANEL
ALBUMIN: 3.6 g/dL (ref 3.5–5.2)
ALT: 18 U/L (ref 0–35)
AST: 20 U/L (ref 0–37)
Alkaline Phosphatase: 74 U/L (ref 39–117)
BILIRUBIN DIRECT: 0.1 mg/dL (ref 0.0–0.3)
Total Bilirubin: 0.5 mg/dL (ref 0.2–1.2)
Total Protein: 7.1 g/dL (ref 6.0–8.3)

## 2014-08-14 LAB — LIPID PANEL
Cholesterol: 307 mg/dL — ABNORMAL HIGH (ref 0–200)
HDL: 50 mg/dL (ref >39.00)
NonHDL: 257
TRIGLYCERIDES: 216 mg/dL — AB (ref 0.0–149.0)
Total CHOL/HDL Ratio: 6
VLDL: 43.2 mg/dL — ABNORMAL HIGH (ref 0.0–40.0)

## 2014-08-14 LAB — BASIC METABOLIC PANEL
BUN: 17 mg/dL (ref 6–23)
CALCIUM: 9.9 mg/dL (ref 8.4–10.5)
CO2: 29 meq/L (ref 19–32)
CREATININE: 0.9 mg/dL (ref 0.4–1.2)
Chloride: 103 mEq/L (ref 96–112)
GFR: 65.23 mL/min (ref >60.00)
GLUCOSE: 81 mg/dL (ref 70–99)
Potassium: 3.6 mEq/L (ref 3.5–5.1)
Sodium: 139 mEq/L (ref 135–145)

## 2014-08-14 LAB — LDL CHOLESTEROL, DIRECT: Direct LDL: 230 mg/dL

## 2014-08-15 LAB — VITAMIN D 25 HYDROXY (VIT D DEFICIENCY, FRACTURES): Vit D, 25-Hydroxy: 42 ng/mL (ref 30–89)

## 2014-08-16 ENCOUNTER — Encounter: Payer: Self-pay | Admitting: Internal Medicine

## 2014-08-16 ENCOUNTER — Ambulatory Visit (INDEPENDENT_AMBULATORY_CARE_PROVIDER_SITE_OTHER): Payer: Medicare Other | Admitting: Internal Medicine

## 2014-08-16 VITALS — BP 122/70 | HR 74 | Temp 98.2°F | Ht 61.5 in | Wt 131.5 lb

## 2014-08-16 DIAGNOSIS — R7989 Other specified abnormal findings of blood chemistry: Secondary | ICD-10-CM

## 2014-08-16 DIAGNOSIS — E7800 Pure hypercholesterolemia, unspecified: Secondary | ICD-10-CM

## 2014-08-16 DIAGNOSIS — Z9109 Other allergy status, other than to drugs and biological substances: Secondary | ICD-10-CM

## 2014-08-16 DIAGNOSIS — Z23 Encounter for immunization: Secondary | ICD-10-CM

## 2014-08-16 DIAGNOSIS — R339 Retention of urine, unspecified: Secondary | ICD-10-CM

## 2014-08-16 DIAGNOSIS — R945 Abnormal results of liver function studies: Secondary | ICD-10-CM

## 2014-08-16 DIAGNOSIS — I10 Essential (primary) hypertension: Secondary | ICD-10-CM

## 2014-08-16 DIAGNOSIS — M810 Age-related osteoporosis without current pathological fracture: Secondary | ICD-10-CM

## 2014-08-16 DIAGNOSIS — R300 Dysuria: Secondary | ICD-10-CM

## 2014-08-16 LAB — POCT URINALYSIS DIPSTICK
BILIRUBIN UA: NEGATIVE
Glucose, UA: NEGATIVE
KETONES UA: NEGATIVE
Nitrite, UA: NEGATIVE
Protein, UA: NEGATIVE
RBC UA: NEGATIVE
Spec Grav, UA: <=1.005
Urobilinogen, UA: 0.2
pH, UA: 7

## 2014-08-16 MED ORDER — CIPROFLOXACIN HCL 250 MG PO TABS: 250.0000 mg | ORAL_TABLET | Freq: Two times a day (BID) | ORAL | Status: DC

## 2014-08-16 NOTE — Progress Notes (Signed)
Pre visit review using our clinic review tool, if applicable. No additional management support is needed unless otherwise documented below in the visit note. 

## 2014-08-16 NOTE — Progress Notes (Signed)
Subjective:    Patient ID: Jamie Elliott, female    DOB: 10/01/1946, 68 y.o.   MRN: [id number]  HPI 68 year old female with past history of hypertension, hypercholesterolemia and allergies who comes in today for a scheduled follow up.  States she is doing relatively well.  No chest pain or tightness.  No sob.  No acid reflux.  No nausea or vomiting.  No bowel change.  She is concerned about chronic sinus issues.  Some intermittent congestion.  On zyrtec, nasacort and saline.   She is concerned because of persistent intermittent symptoms.  We discussed referral to ENT.  No nausea or vomiting.  Previous increased LFTs.  Abdominal ultrasound revealed a gallstone.  No other abnormality.  Have discussed further w/up.  Will follow.  Most recent liver panel wnl - 08/14/14.   She does report that 2 days ago she developed increased lower abdominal pressure and dysuria.  Has been drinking fluids.  No hematuria or increased frequency.  Her cholesterol is elevated.  We discussed restarting her cholesterol medication.       Past Medical History  Diagnosis Date  . Hypertension   . Hypercholesterolemia   . Migraines   . Environmental allergies     Current Outpatient Prescriptions on File Prior to Visit  Medication Sig Dispense Refill  . aspirin 325 MG EC tablet Take 325 mg by mouth daily.      . Calcium Carbonate-Vitamin D (CALCIUM 600+D) 600-200 MG-UNIT TABS Take by mouth 2 (two) times daily.      . cetirizine (ZYRTEC) 10 MG tablet Take 1 tablet (10 mg total) by mouth daily.  30 tablet  5  . diphenhydrAMINE (BENADRYL) 25 mg capsule Take 25 mg by mouth at bedtime as needed.      . Omega-3 Fatty Acids (FISH OIL) 1000 MG CAPS Take by mouth 2 (two) times daily.      . rosuvastatin (CRESTOR) 5 MG tablet Take 5 mg by mouth. Monday, Wednesday, and Friday      . triamcinolone (NASACORT) 55 MCG/ACT nasal inhaler Place 2 sprays into the nose as needed.  1 Inhaler  5  . valsartan-hydrochlorothiazide (DIOVAN-HCT)  160-12.5 MG per tablet Take 1 tablet by mouth daily.  30 tablet  5   No current facility-administered medications on file prior to visit.    Review of Systems Patient denies any headache.  No light headedness.  Some intermittent congestion and allergy symptoms.  No chest pain, tightness or palpitations.  No increased shortness of breath, cough or congestion.   No nausea or vomiting.  No acid reflux. No abdominal pain or cramping.  No bowel change, such as diarrhea, constipation, BRBPR or melana.  Bowels better on probiotics.  Dysuria and lower abdominal pressure as outlined.  No hematuria.  No increased urinary frequency.   Recent liver panel wnl.  Blood pressure doing well.  Cholesterol elevated.          Objective:   Physical Exam  Filed Vitals:   08/16/14 0944  BP: 122/70  Pulse: 74  Temp: 98.2 F (36.8 C)   Blood pressure recheck: 43/71  68 year old female in no acute distress.   HEENT:  Nares- clear.  Oropharynx - without lesions. NECK:  Supple.  Nontender.  No audible bruit.  HEART:  Appears to be regular. LUNGS:  No crackles or wheezing audible.  Respirations even and unlabored.  RADIAL PULSE:  Equal bilaterally.  ABDOMEN:  Soft, nontender.  Bowel sounds present and  normal.  No audible abdominal bruit.   EXTREMITIES:  No increased edema present.  DP pulses palpable and equal bilaterally.      Assessment & Plan:  HEALTH MAINTENANCE.  Physical 04/16/14.  Pap 04/05/13 - ok.  Mammogram 11/16/12 - negative.  F/u mammogram at [facility] 12/11/13 (negative mammo).     I spent 25 minutes with the patient and more than 50% of the time was spent in consultation regarding the above.

## 2014-08-16 NOTE — Patient Instructions (Signed)
Can try align or Hardin Negus colon health - daily.  (probiotic)

## 2014-08-19 ENCOUNTER — Encounter: Payer: Self-pay | Admitting: Internal Medicine

## 2014-08-19 LAB — CULTURE, URINE COMPREHENSIVE: Colony Count: >=100000

## 2014-08-19 NOTE — Assessment & Plan Note (Signed)
Symptoms as outlined.  Urine dip c/w bladder infection.  Treat with cipro as directed.  Send culture.  Follow.

## 2014-08-19 NOTE — Assessment & Plan Note (Signed)
Persistent intermittent allergy and sinus symptoms.  Continue zyrtec, saline nasal spray and nasacort.  We discussed referral to ENT.  She prefers referral to Dr Kathyrn Sheriff for persistent issues despite medications.

## 2014-08-19 NOTE — Assessment & Plan Note (Signed)
Was on a bisphosphonate for a while.  Off for a while now.  Recent bone density revealed osteoporosis.  We discussed treatment options.  Discussed restarting a bisphophonate.  Discussed starting reclast.  Will notify me when agreeable.

## 2014-08-19 NOTE — Assessment & Plan Note (Signed)
Blood pressure as outlined.  Doing better.  Follow pressures.  Follow metabolic panel.   

## 2014-08-19 NOTE — Assessment & Plan Note (Signed)
Liver panel just checked 08/14/14 - wnl.  Abdominal ultrasound ok except for gallstone.  Currently asymptomatic.  Follow.

## 2014-08-19 NOTE — Assessment & Plan Note (Signed)
Low cholesterol diet and exercise.  Cholesterol elevated last check.  Restart crestor.  Check liver panel in 3 weeks.

## 2014-08-28 ENCOUNTER — Telehealth: Payer: Self-pay | Admitting: *Deleted

## 2014-08-28 MED ORDER — ROSUVASTATIN CALCIUM 5 MG PO TABS: 5.0000 mg | ORAL_TABLET | Freq: Every day | ORAL | Status: DC

## 2014-08-28 NOTE — Telephone Encounter (Signed)
Pt states that she is now out of her Crestor 5mg  samples & needs a Rx sent to [facility]. Okay to send?

## 2014-08-28 NOTE — Telephone Encounter (Signed)
rx sent in for crestor 5mg  q day #30 with 2 refills.

## 2014-09-06 ENCOUNTER — Other Ambulatory Visit (INDEPENDENT_AMBULATORY_CARE_PROVIDER_SITE_OTHER): Payer: Medicare Other

## 2014-09-06 DIAGNOSIS — R945 Abnormal results of liver function studies: Secondary | ICD-10-CM

## 2014-09-06 DIAGNOSIS — E7800 Pure hypercholesterolemia, unspecified: Secondary | ICD-10-CM

## 2014-09-06 DIAGNOSIS — R7989 Other specified abnormal findings of blood chemistry: Secondary | ICD-10-CM

## 2014-09-06 LAB — HEPATIC FUNCTION PANEL
ALK PHOS: 64 U/L (ref 39–117)
ALT: 17 U/L (ref 0–35)
AST: 20 U/L (ref 0–37)
Albumin: 3.9 g/dL (ref 3.5–5.2)
BILIRUBIN DIRECT: 0 mg/dL (ref 0.0–0.3)
BILIRUBIN TOTAL: 0.6 mg/dL (ref 0.2–1.2)
Total Protein: 7.2 g/dL (ref 6.0–8.3)

## 2014-09-07 ENCOUNTER — Encounter: Payer: Self-pay | Admitting: *Deleted

## 2014-09-07 ENCOUNTER — Other Ambulatory Visit: Payer: Self-pay | Admitting: Internal Medicine

## 2014-09-07 DIAGNOSIS — E7800 Pure hypercholesterolemia, unspecified: Secondary | ICD-10-CM

## 2014-09-07 NOTE — Progress Notes (Signed)
Order placed for f/u liver panel.  

## 2014-10-01 ENCOUNTER — Other Ambulatory Visit (INDEPENDENT_AMBULATORY_CARE_PROVIDER_SITE_OTHER): Payer: Medicare Other

## 2014-10-01 DIAGNOSIS — E7800 Pure hypercholesterolemia, unspecified: Secondary | ICD-10-CM

## 2014-10-01 LAB — HEPATIC FUNCTION PANEL
ALT: 17 U/L (ref 0–35)
AST: 23 U/L (ref 0–37)
Albumin: 3.5 g/dL (ref 3.5–5.2)
Alkaline Phosphatase: 62 U/L (ref 39–117)
Bilirubin, Direct: 0 mg/dL (ref 0.0–0.3)
TOTAL PROTEIN: 7.4 g/dL (ref 6.0–8.3)
Total Bilirubin: 0.4 mg/dL (ref 0.2–1.2)

## 2014-10-02 ENCOUNTER — Encounter: Payer: Self-pay | Admitting: *Deleted

## 2014-11-20 ENCOUNTER — Encounter: Payer: Self-pay | Admitting: Internal Medicine

## 2014-11-20 ENCOUNTER — Ambulatory Visit (INDEPENDENT_AMBULATORY_CARE_PROVIDER_SITE_OTHER): Payer: Medicare Other | Admitting: Internal Medicine

## 2014-11-20 VITALS — BP 130/70 | HR 88 | Temp 97.9°F | Ht 61.5 in | Wt 127.5 lb

## 2014-11-20 DIAGNOSIS — Z91048 Other nonmedicinal substance allergy status: Secondary | ICD-10-CM

## 2014-11-20 DIAGNOSIS — M810 Age-related osteoporosis without current pathological fracture: Secondary | ICD-10-CM

## 2014-11-20 DIAGNOSIS — Z9109 Other allergy status, other than to drugs and biological substances: Secondary | ICD-10-CM

## 2014-11-20 DIAGNOSIS — E7800 Pure hypercholesterolemia, unspecified: Secondary | ICD-10-CM

## 2014-11-20 DIAGNOSIS — I10 Essential (primary) hypertension: Secondary | ICD-10-CM

## 2014-11-20 DIAGNOSIS — R7989 Other specified abnormal findings of blood chemistry: Secondary | ICD-10-CM

## 2014-11-20 DIAGNOSIS — R945 Abnormal results of liver function studies: Secondary | ICD-10-CM

## 2014-11-20 NOTE — Progress Notes (Signed)
Pre visit review using our clinic review tool, if applicable. No additional management support is needed unless otherwise documented below in the visit note. 

## 2014-11-20 NOTE — Progress Notes (Signed)
Subjective:    Patient ID: Jamie Elliott, female    DOB: 10-25-1946, 68 y.o.   MRN: [id number]  HPI 68 year old female with past history of hypertension, hypercholesterolemia and allergies who comes in today for a scheduled follow up.  States she is doing relatively well.  No chest pain or tightness.  No sob.  No acid reflux.  No nausea or vomiting.  No bowel change.  Had some increased issues recently with sinus congestion and allergies.  Took benadryl.  Helped.  Is better.  Uses nasacort and saline.   No nausea or vomiting.  Previous increased LFTs.  Abdominal ultrasound revealed a gallstone.  No other abnormality.  Have discussed further w/up.  Will follow.  Most recent liver panel wnl - 10/01/14.  Her cholesterol is elevated.  Tolerates taking the medication 3d/week.  Did not tolerate 5d/week.  Will add CoQ 10 and she will take 3d/week.      Past Medical History  Diagnosis Date  . Hypertension   . Hypercholesterolemia   . Migraines   . Environmental allergies     Current Outpatient Prescriptions on File Prior to Visit  Medication Sig Dispense Refill  . aspirin 325 MG EC tablet Take 325 mg by mouth daily.    . Calcium Carbonate-Vitamin D (CALCIUM 600+D) 600-200 MG-UNIT TABS Take by mouth 2 (two) times daily.    . cetirizine (ZYRTEC) 10 MG tablet Take 1 tablet (10 mg total) by mouth daily. 30 tablet 5  . diphenhydrAMINE (BENADRYL) 25 mg capsule Take 25 mg by mouth at bedtime as needed.    . Omega-3 Fatty Acids (FISH OIL) 1000 MG CAPS Take by mouth 2 (two) times daily.    . rosuvastatin (CRESTOR) 5 MG tablet Take 1 tablet (5 mg total) by mouth daily. 30 tablet 2  . triamcinolone (NASACORT) 55 MCG/ACT nasal inhaler Place 2 sprays into the nose as needed. 1 Inhaler 5  . valsartan-hydrochlorothiazide (DIOVAN-HCT) 160-12.5 MG per tablet Take 1 tablet by mouth daily. 30 tablet 5   No current facility-administered medications on file prior to visit.    Review of Systems Patient denies any  headache.  No light headedness.   No chest pain, tightness or palpitations.  No increased shortness of breath, cough or congestion.  The previous congestion is better. No nausea or vomiting.  No acid reflux. No abdominal pain or cramping.  No bowel change, such as diarrhea, constipation, BRBPR or melana.  Bowels better on probiotics.   Recent liver panel wnl.  Blood pressure doing well.  Cholesterol elevated.   See above.           Objective:   Physical Exam  Filed Vitals:   11/20/14 0935  BP: 130/70  Pulse: 88  Temp: 97.9 F (36.6 C)   Blood pressure recheck:  48-75/63  68 year old female in no acute distress.   HEENT:  Nares- clear.  Oropharynx - without lesions. NECK:  Supple.  Nontender.  No audible bruit.  HEART:  Appears to be regular. LUNGS:  No crackles or wheezing audible.  Respirations even and unlabored.  RADIAL PULSE:  Equal bilaterally.  ABDOMEN:  Soft, nontender.  Bowel sounds present and normal.  No audible abdominal bruit.   EXTREMITIES:  No increased edema present.  DP pulses palpable and equal bilaterally.      Assessment & Plan:  1. Essential hypertension Blood pressure as outlined.  Same medication regimen.  Follow.  - Basic metabolic panel; Future  2.  Osteoporosis Was on a bisphosphonate for a while.  Off for a while now.  Recent bone density with osteoporosis.  See previous note for details.  Will let me know when agreeable to restart.   3. Hypercholesterolemia See above.  On crestro 3d/week and tolerates if only 3d/week.  Add CoQ 10.   - Lipid panel; Future  4. Abnormal liver function tests Liver panel last checked 10/15 wnl.  Abdominal US ok except for a gallstone.  Currently asymptomatic.   - CBC with Differential; Future - Hepatic function panel; Future  5. Environmental allergies Recent flare.  Better.  Follow.    HEALTH MAINTENANCE.  Physical 04/16/14.  Pap 04/05/13 - ok.  Mammogram 11/16/12 - negative.  F/u mammogram at [facility] 12/11/13 (negative  mammo).     I spent 25 minutes with the patient and more than 50% of the time was spent in consultation regarding the above.

## 2014-11-20 NOTE — Patient Instructions (Signed)
Take CoQ10 

## 2014-11-25 ENCOUNTER — Encounter: Payer: Self-pay | Admitting: Internal Medicine

## 2014-11-30 ENCOUNTER — Other Ambulatory Visit: Payer: Self-pay | Admitting: *Deleted

## 2014-11-30 MED ORDER — VALSARTAN-HYDROCHLOROTHIAZIDE 160-12.5 MG PO TABS: 1.0000 | ORAL_TABLET | Freq: Every day | ORAL | Status: DC

## 2014-12-05 ENCOUNTER — Other Ambulatory Visit (INDEPENDENT_AMBULATORY_CARE_PROVIDER_SITE_OTHER): Payer: Medicare Other

## 2014-12-05 DIAGNOSIS — R7989 Other specified abnormal findings of blood chemistry: Secondary | ICD-10-CM

## 2014-12-05 DIAGNOSIS — M810 Age-related osteoporosis without current pathological fracture: Secondary | ICD-10-CM

## 2014-12-05 DIAGNOSIS — E7800 Pure hypercholesterolemia, unspecified: Secondary | ICD-10-CM

## 2014-12-05 DIAGNOSIS — R945 Abnormal results of liver function studies: Secondary | ICD-10-CM

## 2014-12-05 DIAGNOSIS — I10 Essential (primary) hypertension: Secondary | ICD-10-CM

## 2014-12-05 LAB — CBC WITH DIFFERENTIAL/PLATELET
Basophils Absolute: 0 10*3/uL (ref 0.0–0.1)
Basophils Relative: 0.6 % (ref 0.0–3.0)
EOS ABS: 0.3 10*3/uL (ref 0.0–0.7)
Eosinophils Relative: 4.7 % (ref 0.0–5.0)
HCT: 39.4 % (ref 36.0–46.0)
Hemoglobin: 13.1 g/dL (ref 12.0–15.0)
Lymphocytes Relative: 23.1 % (ref 12.0–46.0)
Lymphs Abs: 1.6 10*3/uL (ref 0.7–4.0)
MCHC: 33.2 g/dL (ref 30.0–36.0)
MCV: 89.7 fl (ref 78.0–100.0)
MONO ABS: 0.4 10*3/uL (ref 0.1–1.0)
Monocytes Relative: 5.3 % (ref 3.0–12.0)
NEUTROS PCT: 66.3 % (ref 43.0–77.0)
Neutro Abs: 4.7 10*3/uL (ref 1.4–7.7)
Platelets: 255 10*3/uL (ref 150.0–400.0)
RBC: 4.39 Mil/uL (ref 3.87–5.11)
RDW: 13.6 % (ref 11.5–15.5)
WBC: 7 10*3/uL (ref 4.0–10.5)

## 2014-12-05 LAB — LIPID PANEL
Cholesterol: 219 mg/dL — ABNORMAL HIGH (ref 0–200)
HDL: 48.3 mg/dL (ref >39.00)
LDL CALC: 137 mg/dL — AB (ref 0–99)
NonHDL: 170.7
Total CHOL/HDL Ratio: 5
Triglycerides: 171 mg/dL — ABNORMAL HIGH (ref 0.0–149.0)
VLDL: 34.2 mg/dL (ref 0.0–40.0)

## 2014-12-05 LAB — HEPATIC FUNCTION PANEL
ALT: 23 U/L (ref 0–35)
AST: 23 U/L (ref 0–37)
Albumin: 3.9 g/dL (ref 3.5–5.2)
Alkaline Phosphatase: 77 U/L (ref 39–117)
BILIRUBIN DIRECT: 0 mg/dL (ref 0.0–0.3)
BILIRUBIN TOTAL: 0.4 mg/dL (ref 0.2–1.2)
Total Protein: 7 g/dL (ref 6.0–8.3)

## 2014-12-05 LAB — BASIC METABOLIC PANEL
BUN: 15 mg/dL (ref 6–23)
CHLORIDE: 106 meq/L (ref 96–112)
CO2: 25 meq/L (ref 19–32)
Calcium: 9.7 mg/dL (ref 8.4–10.5)
Creatinine, Ser: 0.9 mg/dL (ref 0.4–1.2)
GFR: 68.65 mL/min (ref >60.00)
GLUCOSE: 70 mg/dL (ref 70–99)
POTASSIUM: 3.7 meq/L (ref 3.5–5.1)
Sodium: 142 mEq/L (ref 135–145)

## 2014-12-05 LAB — TSH: TSH: 1.47 u[IU]/mL (ref 0.35–4.50)

## 2014-12-06 ENCOUNTER — Encounter: Payer: Self-pay | Admitting: *Deleted

## 2014-12-13 ENCOUNTER — Ambulatory Visit (INDEPENDENT_AMBULATORY_CARE_PROVIDER_SITE_OTHER): Payer: Medicare Other | Admitting: Nurse Practitioner

## 2014-12-13 ENCOUNTER — Encounter: Payer: Self-pay | Admitting: Nurse Practitioner

## 2014-12-13 VITALS — BP 128/68 | HR 79 | Temp 98.2°F | Resp 12 | Ht 65.1 in | Wt 125.1 lb

## 2014-12-13 DIAGNOSIS — J069 Acute upper respiratory infection, unspecified: Secondary | ICD-10-CM

## 2014-12-13 MED ORDER — HYDROCOD POLST-CHLORPHEN POLST 10-8 MG/5ML PO LQCR: 5.0000 mL | Freq: Every evening | ORAL | Status: DC | PRN

## 2014-12-13 NOTE — Progress Notes (Signed)
Pre visit review using our clinic review tool, if applicable. No additional management support is needed unless otherwise documented below in the visit note. 

## 2014-12-13 NOTE — Assessment & Plan Note (Signed)
Stable. Rx for tussionex 5 mL at bed time. Instructed pt it will make her drowsy. Continue with benadryl, nasacort, and zyrtec. Instructed to drink lots of non-caffeinated fluids and rest. Asked pt to call next week if not improving or if fever of 101 or greater.

## 2014-12-13 NOTE — Progress Notes (Signed)
Subjective:    Patient ID: Jamie Elliott, female    DOB: 10-20-1946, 68 y.o.   MRN: [id number]  HPI Ms. Mapel is a 68 yo female with a CC of cough and wheezing.   1) Coughing x 2 days, coming back from [employer] with grandchildren 78,47, and 107 year olds. Light yellow, wheezing- when lying down only had it two nights ago.  Non-smoker Benadryl- helpful, dry  Nasacort and Zyrtec- helpful   Review of Systems  Constitutional: Positive for fatigue. Negative for fever, chills and diaphoresis.  HENT: Positive for congestion and sinus pressure.   Respiratory: Positive for cough, chest tightness and wheezing. Negative for shortness of breath.        Achy chest after coughing  Cardiovascular: Negative for chest pain, palpitations and leg swelling.  Gastrointestinal: Negative for nausea, vomiting, abdominal pain and diarrhea.  Musculoskeletal: Positive for myalgias.       Neck is tense  Skin: Negative for rash.  Neurological: Negative for dizziness and headaches.   Past Medical History  Diagnosis Date  . Hypertension   . Hypercholesterolemia   . Migraines   . Environmental allergies     History   Social History  . Marital Status: Married    Spouse Name: N/A    Number of Children: 2  . Years of Education: N/A   Occupational History  . Not on file.   Social History Main Topics  . Smoking status: Never Smoker   . Smokeless tobacco: Never Used  . Alcohol Use: No  . Drug Use: No  . Sexual Activity: Not on file   Other Topics Concern  . Not on file   Social History Narrative    Past Surgical History  Procedure Laterality Date  . Septoplasty    . Abdominal hysterectomy  2000  . Appendectomy  2000  . Nose surgery  1967    Family History  Problem Relation Age of Onset  . Heart disease Mother   . Hyperlipidemia Mother   . Hypertension Mother   . Cancer Father     lung, prostate  . Hyperlipidemia Father   . Hypertension Father     Allergies  Allergen Reactions   . Zithromax [Azithromycin]   . Augmentin [Amoxicillin-Pot Clavulanate] Rash    Current Outpatient Prescriptions on File Prior to Visit  Medication Sig Dispense Refill  . aspirin 325 MG EC tablet Take 325 mg by mouth daily.    . Calcium Carbonate-Vitamin D (CALCIUM 600+D) 600-200 MG-UNIT TABS Take by mouth 2 (two) times daily.    . cetirizine (ZYRTEC) 10 MG tablet Take 1 tablet (10 mg total) by mouth daily. 30 tablet 5  . diphenhydrAMINE (BENADRYL) 25 mg capsule Take 25 mg by mouth at bedtime as needed.    . Omega-3 Fatty Acids (FISH OIL) 1000 MG CAPS Take by mouth 2 (two) times daily.    . rosuvastatin (CRESTOR) 5 MG tablet Take 1 tablet (5 mg total) by mouth daily. 30 tablet 2  . triamcinolone (NASACORT) 55 MCG/ACT nasal inhaler Place 2 sprays into the nose as needed. 1 Inhaler 5  . valsartan-hydrochlorothiazide (DIOVAN-HCT) 160-12.5 MG per tablet Take 1 tablet by mouth daily. 30 tablet 5   No current facility-administered medications on file prior to visit.       Objective:   Physical Exam  Constitutional: She is oriented to person, place, and time. She appears well-developed and well-nourished. No distress.  HENT:  Head: Normocephalic and atraumatic.  Eyes: Conjunctivae  and EOM are normal. Pupils are equal, round, and reactive to light.  Cardiovascular: Normal rate and regular rhythm.   Pulmonary/Chest: Effort normal and breath sounds normal.  Neurological: She is alert and oriented to person, place, and time.  Skin: Skin is warm and dry. No rash noted. She is not diaphoretic.  Psychiatric: She has a normal mood and affect. Her behavior is normal. Judgment and thought content normal.   BP 128/68 mmHg  Pulse 79  Temp(Src) 98.2 F (36.8 C) (Oral)  Resp 12  Ht 5' 5.1" (1.654 m)  Wt 125 lb 1.9 oz (56.754 kg)  BMI 20.75 kg/m2  SpO2 95%  LMP 11/28/1998     Assessment & Plan:

## 2014-12-13 NOTE — Patient Instructions (Addendum)
The cough syrup will make you sleepy, take at bed time.   Continue with benadryl, nasacort, and zyrtec.   Drink lots of non-caffeinated fluids and rest.   Merry Christmas!

## 2014-12-20 LAB — HM MAMMOGRAPHY: HM Mammogram: NEGATIVE

## 2014-12-28 ENCOUNTER — Encounter: Payer: Self-pay | Admitting: Internal Medicine

## 2014-12-31 ENCOUNTER — Other Ambulatory Visit: Payer: Self-pay | Admitting: *Deleted

## 2014-12-31 MED ORDER — CETIRIZINE HCL 10 MG PO TABS: 10.0000 mg | ORAL_TABLET | Freq: Every day | ORAL | Status: DC

## 2015-04-18 ENCOUNTER — Encounter: Payer: Medicare Other | Admitting: Internal Medicine

## 2015-05-01 ENCOUNTER — Encounter: Payer: Self-pay | Admitting: Internal Medicine

## 2015-05-15 ENCOUNTER — Encounter: Payer: Self-pay | Admitting: Internal Medicine

## 2015-05-15 ENCOUNTER — Ambulatory Visit (INDEPENDENT_AMBULATORY_CARE_PROVIDER_SITE_OTHER): Payer: Medicare Other | Admitting: Internal Medicine

## 2015-05-15 VITALS — BP 128/76 | HR 74 | Temp 98.0°F | Ht 61.0 in | Wt 128.1 lb

## 2015-05-15 DIAGNOSIS — E7800 Pure hypercholesterolemia, unspecified: Secondary | ICD-10-CM

## 2015-05-15 DIAGNOSIS — M810 Age-related osteoporosis without current pathological fracture: Secondary | ICD-10-CM | POA: Diagnosis not present

## 2015-05-15 DIAGNOSIS — M79606 Pain in leg, unspecified: Secondary | ICD-10-CM

## 2015-05-15 DIAGNOSIS — R7989 Other specified abnormal findings of blood chemistry: Secondary | ICD-10-CM

## 2015-05-15 DIAGNOSIS — R945 Abnormal results of liver function studies: Secondary | ICD-10-CM

## 2015-05-15 DIAGNOSIS — I10 Essential (primary) hypertension: Secondary | ICD-10-CM

## 2015-05-15 DIAGNOSIS — N649 Disorder of breast, unspecified: Secondary | ICD-10-CM

## 2015-05-15 DIAGNOSIS — Z0000 Encounter for general adult medical examination without abnormal findings: Secondary | ICD-10-CM

## 2015-05-15 NOTE — Progress Notes (Signed)
Patient ID: Jamie Elliott, female   DOB: 04/16/1946, 69 y.o.   MRN: [id number]   Subjective:    Patient ID: Jamie Elliott, female    DOB: Dec 22, 1945, 69 y.o.   MRN: [id number]  HPI  Patient here to follow up on her current medical issues as well as for a complete physical exam.   Had persistent breast lesion.  Saw Dr Evorn Gong.  Had biopsy.  Is concerned and had lots of questions about follow up.  States was told needed f/u with Dr Evorn Gong in one year.  She request a second opinion.  Wants to see Dr Phillip Heal.  She also reports some intermittent discomfort in her low back and right leg.  No pain now.  Flares intermittently.  We discussed the cholesterol medication.  Was questioning whether or not the statin medication could be contributing.  No pain now.  Cholesterol better.   Wants to monitor for now.  Bowels stable.  Align helps her bowels.  Stays active.  No cardiac symptoms with increased activity or exertion.     Past Medical History  Diagnosis Date  . Hypertension   . Hypercholesterolemia   . Migraines   . Environmental allergies     Current Outpatient Prescriptions on File Prior to Visit  Medication Sig Dispense Refill  . aspirin 325 MG EC tablet Take 325 mg by mouth daily.    . Calcium Carbonate-Vitamin D (CALCIUM 600+D) 600-200 MG-UNIT TABS Take by mouth 2 (two) times daily.    . cetirizine (ZYRTEC) 10 MG tablet Take 1 tablet (10 mg total) by mouth daily. 30 tablet 11  . diphenhydrAMINE (BENADRYL) 25 mg capsule Take 25 mg by mouth at bedtime as needed.    . Omega-3 Fatty Acids (FISH OIL) 1000 MG CAPS Take by mouth 2 (two) times daily.    . rosuvastatin (CRESTOR) 5 MG tablet Take 1 tablet (5 mg total) by mouth daily. (Patient taking differently: Take 5 mg by mouth daily. Take on Mon, Wed, & Friday) 30 tablet 2  . triamcinolone (NASACORT) 55 MCG/ACT nasal inhaler Place 2 sprays into the nose as needed. 1 Inhaler 5  . valsartan-hydrochlorothiazide (DIOVAN-HCT) 160-12.5 MG per tablet Take 1  tablet by mouth daily. 30 tablet 5   No current facility-administered medications on file prior to visit.    Review of Systems  Constitutional: Negative for appetite change and unexpected weight change.  HENT: Negative for congestion and sinus pressure.   Eyes: Negative for pain and visual disturbance.  Respiratory: Negative for cough, chest tightness and shortness of breath.   Cardiovascular: Negative for chest pain, palpitations and leg swelling.  Gastrointestinal: Negative for nausea, vomiting, abdominal pain and diarrhea.  Genitourinary: Negative for dysuria and difficulty urinating.  Musculoskeletal:       Low back pain and pain radiating down right leg as outlined.    Skin: Negative for color change and rash.  Neurological: Negative for dizziness, light-headedness and headaches.  Hematological: Negative for adenopathy. Does not bruise/bleed easily.  Psychiatric/Behavioral: Negative for dysphoric mood and agitation.       Objective:     Blood pressure recheck:  138/74  Physical Exam  Constitutional: She is oriented to person, place, and time. She appears well-developed and well-nourished.  HENT:  Nose: Nose normal.  Mouth/Throat: Oropharynx is clear and moist.  Eyes: Right eye exhibits no discharge. Left eye exhibits no discharge. No scleral icterus.  Neck: Neck supple. No thyromegaly present.  Cardiovascular: Normal rate and regular rhythm.  Pulmonary/Chest: Breath sounds normal. No accessory muscle usage. No tachypnea. No respiratory distress. She has no decreased breath sounds. She has no wheezes. She has no rhonchi. Right breast exhibits no inverted nipple, no mass, no nipple discharge and no tenderness (no axillary adenopathy). Left breast exhibits no inverted nipple, no mass, no nipple discharge and no tenderness (no axilarry adenopathy).  Abdominal: Soft. Bowel sounds are normal. There is no tenderness.  Musculoskeletal: She exhibits no edema or tenderness.    Lymphadenopathy:    She has no cervical adenopathy.  Neurological: She is alert and oriented to person, place, and time.  Skin: Skin is warm. No rash noted.  Psychiatric: She has a normal mood and affect. Her behavior is normal.    BP 128/76 mmHg  Pulse 74  Temp(Src) 98 F (36.7 C) (Oral)  Ht 5\' 1"  (1.549 m)  Wt 128 lb 2 oz (58.117 kg)  BMI 24.22 kg/m2  SpO2 96%  LMP 11/28/1998 Wt Readings from Last 3 Encounters:  05/15/15 128 lb 2 oz (58.117 kg)  12/13/14 125 lb 1.9 oz (56.754 kg)  11/20/14 127 lb 8 oz (57.834 kg)     Lab Results  Component Value Date   WBC 7.0 12/05/2014   HGB 13.1 12/05/2014   HCT 39.4 12/05/2014   PLT 255.0 12/05/2014   GLUCOSE 70 12/05/2014   CHOL 219* 12/05/2014   TRIG 171.0* 12/05/2014   HDL 48.30 12/05/2014   LDLDIRECT 230.0 08/14/2014   LDLCALC 137* 12/05/2014   ALT 23 12/05/2014   AST 23 12/05/2014   NA 142 12/05/2014   K 3.7 12/05/2014   CL 106 12/05/2014   CREATININE 0.9 12/05/2014   BUN 15 12/05/2014   CO2 25 12/05/2014   TSH 1.47 12/05/2014       Assessment & Plan:   Problem List Items Addressed This Visit    Abnormal liver function tests    Abdominal ultrasound ok except for gallstone.  Follow liver panel.       Health care maintenance    Physical 05/15/15.  PAP 04/05/13 - ok.  Mammogram 12/18/14 recommended f/u mammogram.  F/u mammogram 12/20/14 ok.  Recommended f/u mammogram in one year.  Colonoscopy 12/11/11 - one polyp in the rectosigmoid colon and diverticulosis.        Hypercholesterolemia    On crestor.  Low cholesterol diet and exercise.  Follow lipid panel.  Hold on changing medication.  Unsure if contributing to the intermittent back/leg pain.       Relevant Orders   Lipid panel   Hepatic function panel   Hypertension - Primary    Blood pressure as outlined.  Same medication regimen.  Follow pressures.  Follow metabolic panel.        Relevant Orders   Basic metabolic panel   Leg pain    Leg pain as  outlined.  No pain now.  Discussed further evaluation.  Discussed temporarily stopping the statin medication.  Hold on making any changes now.  Follow.        Osteoporosis    Was treated with bisphosphonate for a while.  Off now.  Bone density revealed osteoporosis.  Have discussed treatment options.  See last note for details.  Will notify me when agreeable.         Other Visit Diagnoses    Breast lesion        Relevant Orders    Ambulatory referral to Dermatology      I spent 25 minutes with the patient  and more than 50% of the time was spent in consultation regarding the above.     Einar Pheasant, MD

## 2015-05-15 NOTE — Progress Notes (Signed)
Pre visit review using our clinic review tool, if applicable. No additional management support is needed unless otherwise documented below in the visit note. 

## 2015-05-16 ENCOUNTER — Encounter: Payer: Self-pay | Admitting: Internal Medicine

## 2015-05-19 ENCOUNTER — Encounter: Payer: Self-pay | Admitting: Internal Medicine

## 2015-05-19 DIAGNOSIS — Z0000 Encounter for general adult medical examination without abnormal findings: Secondary | ICD-10-CM | POA: Insufficient documentation

## 2015-05-19 DIAGNOSIS — M79606 Pain in leg, unspecified: Secondary | ICD-10-CM | POA: Insufficient documentation

## 2015-05-19 NOTE — Assessment & Plan Note (Signed)
Physical 05/15/15.  PAP 04/05/13 - ok.  Mammogram 12/18/14 recommended f/u mammogram.  F/u mammogram 12/20/14 ok.  Recommended f/u mammogram in one year.  Colonoscopy 12/11/11 - one polyp in the rectosigmoid colon and diverticulosis.

## 2015-05-19 NOTE — Assessment & Plan Note (Signed)
On crestor.  Low cholesterol diet and exercise.  Follow lipid panel.  Hold on changing medication.  Unsure if contributing to the intermittent back/leg pain.

## 2015-05-19 NOTE — Assessment & Plan Note (Signed)
Leg pain as outlined.  No pain now.  Discussed further evaluation.  Discussed temporarily stopping the statin medication.  Hold on making any changes now.  Follow.

## 2015-05-19 NOTE — Assessment & Plan Note (Signed)
Was treated with bisphosphonate for a while.  Off now.  Bone density revealed osteoporosis.  Have discussed treatment options.  See last note for details.  Will notify me when agreeable.

## 2015-05-19 NOTE — Assessment & Plan Note (Signed)
Blood pressure as outlined.  Same medication regimen.  Follow pressures.  Follow metabolic panel.  

## 2015-05-19 NOTE — Assessment & Plan Note (Signed)
Abdominal ultrasound ok except for gallstone.  Follow liver panel.

## 2015-05-21 ENCOUNTER — Other Ambulatory Visit (INDEPENDENT_AMBULATORY_CARE_PROVIDER_SITE_OTHER): Payer: Medicare Other

## 2015-05-21 DIAGNOSIS — I10 Essential (primary) hypertension: Secondary | ICD-10-CM | POA: Diagnosis not present

## 2015-05-21 DIAGNOSIS — E7800 Pure hypercholesterolemia, unspecified: Secondary | ICD-10-CM

## 2015-05-21 LAB — BASIC METABOLIC PANEL
BUN: 15 mg/dL (ref 6–23)
CALCIUM: 9.7 mg/dL (ref 8.4–10.5)
CO2: 30 mEq/L (ref 19–32)
Chloride: 104 mEq/L (ref 96–112)
Creatinine, Ser: 0.85 mg/dL (ref 0.40–1.20)
GFR: 70.42 mL/min (ref >60.00)
GLUCOSE: 84 mg/dL (ref 70–99)
Potassium: 3.7 mEq/L (ref 3.5–5.1)
SODIUM: 138 meq/L (ref 135–145)

## 2015-05-21 LAB — HEPATIC FUNCTION PANEL
ALT: 29 U/L (ref 0–35)
AST: 29 U/L (ref 0–37)
Albumin: 3.8 g/dL (ref 3.5–5.2)
Alkaline Phosphatase: 82 U/L (ref 39–117)
BILIRUBIN TOTAL: 0.3 mg/dL (ref 0.2–1.2)
Bilirubin, Direct: 0 mg/dL (ref 0.0–0.3)
TOTAL PROTEIN: 6.7 g/dL (ref 6.0–8.3)

## 2015-05-21 LAB — LDL CHOLESTEROL, DIRECT: Direct LDL: 103 mg/dL

## 2015-05-21 LAB — LIPID PANEL
Cholesterol: 242 mg/dL — ABNORMAL HIGH (ref 0–200)
HDL: 41.7 mg/dL (ref >39.00)
NONHDL: 200.3
Total CHOL/HDL Ratio: 6
Triglycerides: 383 mg/dL — ABNORMAL HIGH (ref 0.0–149.0)
VLDL: 76.6 mg/dL — AB (ref 0.0–40.0)

## 2015-05-22 ENCOUNTER — Encounter: Payer: Self-pay | Admitting: *Deleted

## 2015-06-13 ENCOUNTER — Ambulatory Visit (INDEPENDENT_AMBULATORY_CARE_PROVIDER_SITE_OTHER): Payer: Medicare Other | Admitting: Internal Medicine

## 2015-06-13 ENCOUNTER — Encounter: Payer: Self-pay | Admitting: Internal Medicine

## 2015-06-13 VITALS — BP 126/68 | HR 85 | Temp 98.2°F | Wt 126.0 lb

## 2015-06-13 DIAGNOSIS — N390 Urinary tract infection, site not specified: Secondary | ICD-10-CM

## 2015-06-13 DIAGNOSIS — R350 Frequency of micturition: Secondary | ICD-10-CM | POA: Diagnosis not present

## 2015-06-13 LAB — POCT URINALYSIS DIPSTICK
Bilirubin, UA: NEGATIVE
Blood, UA: NEGATIVE
Glucose, UA: NEGATIVE
Ketones, UA: NEGATIVE
Nitrite, UA: NEGATIVE
PH UA: 6
PROTEIN UA: NEGATIVE
SPEC GRAV UA: 1.015
Urobilinogen, UA: NEGATIVE

## 2015-06-13 MED ORDER — CIPROFLOXACIN HCL 250 MG PO TABS: 250.0000 mg | ORAL_TABLET | Freq: Two times a day (BID) | ORAL | Status: DC

## 2015-06-13 NOTE — Progress Notes (Signed)
HPI  Pt presents to the clinic today with c/o urinary frequency and abdominal pressure. This started yesterday. She denies fever, chills nausea or low back pain. She has increased her water intake. She has not tried anything OTC. She denies vaginal complaints.   Review of Systems  Past Medical History  Diagnosis Date  . Hypertension   . Hypercholesterolemia   . Migraines   . Environmental allergies     Family History  Problem Relation Age of Onset  . Heart disease Mother   . Hyperlipidemia Mother   . Hypertension Mother   . Cancer Father     lung, prostate  . Hyperlipidemia Father   . Hypertension Father     History   Social History  . Marital Status: Married    Spouse Name: N/A  . Number of Children: 2  . Years of Education: N/A   Occupational History  . Not on file.   Social History Main Topics  . Smoking status: Never Smoker   . Smokeless tobacco: Never Used  . Alcohol Use: No  . Drug Use: No  . Sexual Activity: Not on file   Other Topics Concern  . Not on file   Social History Narrative    Allergies  Allergen Reactions  . Zithromax [Azithromycin]   . Augmentin [Amoxicillin-Pot Clavulanate] Rash    Constitutional: Denies fever, malaise, fatigue, headache or abrupt weight changes.   GU: Pt reports urgency, frequency and bladder pressure. Denies burning sensation, blood in urine, odor or discharge. Skin: Denies redness, rashes, lesions or ulcercations.   No other specific complaints in a complete review of systems (except as listed in HPI above).    Objective:   Physical Exam  BP 126/68 mmHg  Pulse 85  Temp(Src) 98.2 F (36.8 C) (Oral)  Wt 126 lb (57.153 kg)  SpO2 98%  LMP 11/28/1998  Wt Readings from Last 3 Encounters:  06/13/15 126 lb (57.153 kg)  05/15/15 128 lb 2 oz (58.117 kg)  12/13/14 125 lb 1.9 oz (56.754 kg)    General: Appears her stated age, well developed, well nourished in NAD. Cardiovascular: Normal rate and rhythm. S1,S2  noted.   Pulmonary/Chest: Normal effort and positive vesicular breath sounds. No respiratory distress. No wheezes, rales or ronchi noted.  Abdomen: Soft. Normal bowel sounds, no bruits noted. No distention or masses noted.  Tender to palpation over the bladder area. No CVA tenderness.      Assessment & Plan:   Urgency, Frequency, Bladder pressure secondary to UTI:  Urinalysis: 3+ leuks Will send urine culture eRx sent if for Cipro 250 mg BID x 5 days OK to take AZO OTC Drink plenty of fluids  RTC as needed or if symptoms persist.

## 2015-06-13 NOTE — Patient Instructions (Signed)

## 2015-06-13 NOTE — Progress Notes (Signed)
Pre visit review using our clinic review tool, if applicable. No additional management support is needed unless otherwise documented below in the visit note. 

## 2015-06-15 LAB — URINE CULTURE: Colony Count: >=100000

## 2015-06-28 ENCOUNTER — Other Ambulatory Visit: Payer: Self-pay | Admitting: *Deleted

## 2015-06-28 MED ORDER — VALSARTAN-HYDROCHLOROTHIAZIDE 160-12.5 MG PO TABS: 1.0000 | ORAL_TABLET | Freq: Every day | ORAL | Status: DC

## 2015-07-18 ENCOUNTER — Ambulatory Visit (INDEPENDENT_AMBULATORY_CARE_PROVIDER_SITE_OTHER): Payer: Medicare Other | Admitting: Nurse Practitioner

## 2015-07-18 ENCOUNTER — Encounter: Payer: Self-pay | Admitting: Nurse Practitioner

## 2015-07-18 ENCOUNTER — Encounter (INDEPENDENT_AMBULATORY_CARE_PROVIDER_SITE_OTHER): Payer: Self-pay

## 2015-07-18 VITALS — BP 124/70 | HR 68 | Temp 98.2°F | Resp 16 | Ht 61.0 in | Wt 124.0 lb

## 2015-07-18 DIAGNOSIS — N390 Urinary tract infection, site not specified: Secondary | ICD-10-CM

## 2015-07-18 DIAGNOSIS — R300 Dysuria: Secondary | ICD-10-CM | POA: Diagnosis not present

## 2015-07-18 LAB — POCT URINALYSIS DIPSTICK
BILIRUBIN UA: NEGATIVE
Glucose, UA: NEGATIVE
KETONES UA: NEGATIVE
NITRITE UA: NEGATIVE
PH UA: 6.5
PROTEIN UA: NEGATIVE
Spec Grav, UA: <=1.005
Urobilinogen, UA: 0.2

## 2015-07-18 MED ORDER — CIPROFLOXACIN HCL 250 MG PO TABS: 250.0000 mg | ORAL_TABLET | Freq: Two times a day (BID) | ORAL | Status: DC

## 2015-07-18 NOTE — Assessment & Plan Note (Signed)
POCT urine probable for UTI. Will do Cipro for 7 days and obtain culture. Asked pt to try Azo if burning is persistent and Probiotics while on abx and 2 weeks after. FU prn worsening/failure to improve.

## 2015-07-18 NOTE — Progress Notes (Signed)
Pre visit review using our clinic review tool, if applicable. No additional management support is needed unless otherwise documented below in the visit note. 

## 2015-07-18 NOTE — Progress Notes (Signed)
   Subjective:    Patient ID: Jamie Elliott, female    DOB: 10/06/1946, 69 y.o.   MRN: [id number]  HPI  Jamie Elliott is a 69 yo female with a CC of dysuria x 1 day.   1) Patient reports pressure, burning, frequency Denies- flank pain, chills, fever, sweats   Review of Systems  Constitutional: Negative for fever, chills, diaphoresis and fatigue.  Gastrointestinal: Negative for nausea, vomiting and diarrhea.  Genitourinary: Positive for dysuria and frequency. Negative for urgency, hematuria, flank pain and decreased urine volume.      Objective:   Physical Exam  Constitutional: She is oriented to person, place, and time. She appears well-developed and well-nourished. No distress.  BP 124/70 mmHg  Pulse 68  Temp(Src) 98.2 F (36.8 C)  Resp 16  Ht 5\' 1"  (1.549 m)  Wt 124 lb (56.246 kg)  BMI 23.44 kg/m2  SpO2 98%  LMP 11/28/1998   HENT:  Head: Normocephalic and atraumatic.  Right Ear: External ear normal.  Left Ear: External ear normal.  Mouth/Throat: No oropharyngeal exudate.  Eyes: EOM are normal. Pupils are equal, round, and reactive to light. Right eye exhibits no discharge. Left eye exhibits no discharge. No scleral icterus.  Neck: Normal range of motion.  Cardiovascular: Normal rate and regular rhythm.   Abdominal: There is no CVA tenderness.  Neurological: She is alert and oriented to person, place, and time.  Skin: Skin is warm and dry. No rash noted. She is not diaphoretic.  Psychiatric: She has a normal mood and affect. Her behavior is normal. Judgment and thought content normal.      Assessment & Plan:

## 2015-07-18 NOTE — Patient Instructions (Signed)
AZO over the counter   Probiotic while on antibiotic and 2 weeks after.   Call us Monday if you are not getting relief.

## 2015-07-20 LAB — URINE CULTURE: Colony Count: 40000

## 2015-07-24 ENCOUNTER — Telehealth: Payer: Self-pay | Admitting: Internal Medicine

## 2015-07-24 NOTE — Telephone Encounter (Signed)
Pt came in complaining that she is still having symptoms of a UTI. Pt was ask to filled out a triage form. Doss-NP advise of pt complaint and stated that she is stay waiting all the labs to come back. Some of the lab work that has resulted show that the pt does not have a UTI but the lab work will show the type of antibodotics that can be used to treat pt. Doss-NP stated that she will follow up with the pt by the end of the day or tomorrow. Pt advised of Doss-NP statement.

## 2015-07-25 ENCOUNTER — Telehealth: Payer: Self-pay | Admitting: Internal Medicine

## 2015-07-25 ENCOUNTER — Ambulatory Visit (INDEPENDENT_AMBULATORY_CARE_PROVIDER_SITE_OTHER): Payer: Medicare Other | Admitting: Nurse Practitioner

## 2015-07-25 VITALS — BP 118/80 | HR 71 | Temp 98.3°F | Resp 16 | Ht 61.0 in | Wt 121.8 lb

## 2015-07-25 DIAGNOSIS — M545 Low back pain: Secondary | ICD-10-CM | POA: Diagnosis not present

## 2015-07-25 DIAGNOSIS — R350 Frequency of micturition: Secondary | ICD-10-CM

## 2015-07-25 DIAGNOSIS — Z87440 Personal history of urinary (tract) infections: Secondary | ICD-10-CM

## 2015-07-25 MED ORDER — NITROFURANTOIN MONOHYD MACRO 100 MG PO CAPS: 100.0000 mg | ORAL_CAPSULE | Freq: Two times a day (BID) | ORAL | Status: DC

## 2015-07-25 NOTE — Progress Notes (Signed)
Pre visit review using our clinic review tool, if applicable. No additional management support is needed unless otherwise documented below in the visit note. 

## 2015-07-25 NOTE — Telephone Encounter (Signed)
Pt calling back she came up yesterday and filled out a triage form. Pt states she doesn't have anymore medication. rm

## 2015-07-25 NOTE — Telephone Encounter (Signed)
Cheyenne Adas, CMA handled, appt for today at 130pm

## 2015-07-25 NOTE — Telephone Encounter (Signed)
Follow up from yesterday.  Any new results for patient? Please advise and I will call her back.  Thanks

## 2015-07-25 NOTE — Telephone Encounter (Signed)
Results are in- sent to [facility]- she has not called her yet, but she hasn't received a message she reports. Please let her know that Cipro was sensitive to her bacteria, but it was not a true UTI due to the count being low of how much bacteria there was. If she is still having symptoms we can see her again. Thanks!

## 2015-07-25 NOTE — Progress Notes (Signed)
   Subjective:    Patient ID: Jamie Elliott, female    DOB: 27-Apr-1946, 69 y.o.   MRN: [id number]  HPI  Jamie Elliott is a 69 yo female with a CC of uti symptoms despite recent treatment.   1) UTI symptoms- Friday felt much better, Saturday was worse, low abdominal pressure with tenderness, low back pain, rested, drinking lots of water. Has been to urology Left low back pain, frequency   Review of Systems  Constitutional: Negative for fever, chills, diaphoresis and fatigue.  Gastrointestinal: Positive for abdominal pain.  Genitourinary: Positive for frequency. Negative for dysuria, urgency, flank pain, decreased urine volume, vaginal bleeding, vaginal discharge, vaginal pain and pelvic pain.  Musculoskeletal: Positive for back pain.       Objective:   Physical Exam  Constitutional: She is oriented to person, place, and time. She appears well-developed and well-nourished. No distress.  BP 118/80 mmHg  Pulse 71  Temp(Src) 98.3 F (36.8 C)  Resp 16  Ht 5\' 1"  (1.549 m)  Wt 121 lb 12.8 oz (55.248 kg)  BMI 23.03 kg/m2  SpO2 97%  LMP 11/28/1998   HENT:  Head: Normocephalic and atraumatic.  Right Ear: External ear normal.  Left Ear: External ear normal.  Abdominal: Soft. She exhibits no distension and no mass. There is no tenderness. There is no rebound, no guarding and no CVA tenderness.  Musculoskeletal: Normal range of motion. She exhibits tenderness. She exhibits no edema.  Low lumbar paraspinal muscle tenderness to palpation. Too low for "flank pain"  Neurological: She is alert and oriented to person, place, and time. No cranial nerve deficit. She exhibits normal muscle tone. Coordination normal.  Skin: Skin is warm and dry. No rash noted. She is not diaphoretic.  Psychiatric: She has a normal mood and affect. Her behavior is normal. Judgment and thought content normal.      Assessment & Plan:

## 2015-08-01 ENCOUNTER — Encounter: Payer: Self-pay | Admitting: Nurse Practitioner

## 2015-08-01 NOTE — Assessment & Plan Note (Signed)
Pt was seen previously for UTI symptoms with a POCT urine that was possible, but culture that was negative. Pt switched to nitrofurantoin today for 5 days bid. FU prn worsening/failure to improve.  Encouraged probiotics

## 2015-08-14 ENCOUNTER — Other Ambulatory Visit (INDEPENDENT_AMBULATORY_CARE_PROVIDER_SITE_OTHER): Payer: Medicare Other

## 2015-08-14 ENCOUNTER — Telehealth: Payer: Self-pay | Admitting: *Deleted

## 2015-08-14 DIAGNOSIS — E7800 Pure hypercholesterolemia, unspecified: Secondary | ICD-10-CM

## 2015-08-14 DIAGNOSIS — R7989 Other specified abnormal findings of blood chemistry: Secondary | ICD-10-CM

## 2015-08-14 DIAGNOSIS — I10 Essential (primary) hypertension: Secondary | ICD-10-CM | POA: Diagnosis not present

## 2015-08-14 DIAGNOSIS — R945 Abnormal results of liver function studies: Secondary | ICD-10-CM

## 2015-08-14 LAB — BASIC METABOLIC PANEL
BUN: 14 mg/dL (ref 6–23)
CHLORIDE: 103 meq/L (ref 96–112)
CO2: 30 mEq/L (ref 19–32)
CREATININE: 0.77 mg/dL (ref 0.40–1.20)
Calcium: 10 mg/dL (ref 8.4–10.5)
GFR: 78.87 mL/min (ref >60.00)
GLUCOSE: 83 mg/dL (ref 70–99)
POTASSIUM: 3.8 meq/L (ref 3.5–5.1)
Sodium: 141 mEq/L (ref 135–145)

## 2015-08-14 LAB — HEPATIC FUNCTION PANEL
ALT: 20 U/L (ref 0–35)
AST: 21 U/L (ref 0–37)
Albumin: 3.9 g/dL (ref 3.5–5.2)
Alkaline Phosphatase: 71 U/L (ref 39–117)
BILIRUBIN DIRECT: 0.1 mg/dL (ref 0.0–0.3)
BILIRUBIN TOTAL: 0.5 mg/dL (ref 0.2–1.2)
TOTAL PROTEIN: 6.3 g/dL (ref 6.0–8.3)

## 2015-08-14 LAB — LIPID PANEL
CHOL/HDL RATIO: 6
CHOLESTEROL: 297 mg/dL — AB (ref 0–200)
HDL: 47.4 mg/dL (ref >39.00)
LDL Cholesterol: 210 mg/dL — ABNORMAL HIGH (ref 0–99)
NonHDL: 249.4
TRIGLYCERIDES: 197 mg/dL — AB (ref 0.0–149.0)
VLDL: 39.4 mg/dL (ref 0.0–40.0)

## 2015-08-14 NOTE — Telephone Encounter (Signed)
Labs and dx?  

## 2015-08-14 NOTE — Telephone Encounter (Signed)
Orders placed for labs

## 2015-08-20 ENCOUNTER — Ambulatory Visit (INDEPENDENT_AMBULATORY_CARE_PROVIDER_SITE_OTHER): Payer: Medicare Other | Admitting: Internal Medicine

## 2015-08-20 ENCOUNTER — Encounter: Payer: Self-pay | Admitting: Internal Medicine

## 2015-08-20 ENCOUNTER — Ambulatory Visit
Admission: RE | Admit: 2015-08-20 | Discharge: 2015-08-20 | Disposition: A | Discharge status: Home / Self Care | Payer: Medicare Other | Source: Ambulatory Visit | Attending: Internal Medicine | Admitting: Internal Medicine

## 2015-08-20 ENCOUNTER — Encounter (INDEPENDENT_AMBULATORY_CARE_PROVIDER_SITE_OTHER): Payer: Self-pay

## 2015-08-20 VITALS — BP 120/70 | HR 67 | Temp 98.1°F | Ht 61.0 in | Wt 120.4 lb

## 2015-08-20 DIAGNOSIS — Z23 Encounter for immunization: Secondary | ICD-10-CM

## 2015-08-20 DIAGNOSIS — I10 Essential (primary) hypertension: Secondary | ICD-10-CM

## 2015-08-20 DIAGNOSIS — M79601 Pain in right arm: Secondary | ICD-10-CM

## 2015-08-20 DIAGNOSIS — Z91048 Other nonmedicinal substance allergy status: Secondary | ICD-10-CM

## 2015-08-20 DIAGNOSIS — Z9109 Other allergy status, other than to drugs and biological substances: Secondary | ICD-10-CM

## 2015-08-20 DIAGNOSIS — E7800 Pure hypercholesterolemia, unspecified: Secondary | ICD-10-CM

## 2015-08-20 DIAGNOSIS — M25511 Pain in right shoulder: Secondary | ICD-10-CM

## 2015-08-20 DIAGNOSIS — R945 Abnormal results of liver function studies: Secondary | ICD-10-CM

## 2015-08-20 DIAGNOSIS — Z87898 Personal history of other specified conditions: Secondary | ICD-10-CM

## 2015-08-20 DIAGNOSIS — R7989 Other specified abnormal findings of blood chemistry: Secondary | ICD-10-CM

## 2015-08-20 DIAGNOSIS — Z9189 Other specified personal risk factors, not elsewhere classified: Secondary | ICD-10-CM

## 2015-08-20 MED ORDER — ATORVASTATIN CALCIUM 10 MG PO TABS: 10.0000 mg | ORAL_TABLET | Freq: Every day | ORAL | Status: DC

## 2015-08-20 NOTE — Progress Notes (Signed)
Patient ID: Jamie Elliott, female   DOB: 04/04/1946, 69 y.o.   MRN: [id number]   Subjective:    Patient ID: Jamie Elliott, female    DOB: 04/12/1946, 69 y.o.   MRN: [id number]  HPI  Patient here for a scheduled follow up.  She fell two weeks ago.  Tripped.  Hit her right upper arm, right knee and her left side head.  No residual headache.  No dizziness or light headedness.  Main complaint is that of persistent right arm aching.  Hurts to lift her arm.  Some limited rom.  Cholesterol remains elevated.  Agreed to start lipitor.  Discussed diet and exercise.  Discussed carb adjustment.  Stays active.  No cardiac symptoms with increased activity or exertion.  No sob.  No acid reflux reported.  No abdominal pain or cramping.  Bowels stable.     Past Medical History  Diagnosis Date  . Hypertension   . Hypercholesterolemia   . Migraines   . Environmental allergies    Past Surgical History  Procedure Laterality Date  . Septoplasty    . Abdominal hysterectomy  2000  . Appendectomy  2000  . Nose surgery  1967   Family History  Problem Relation Age of Onset  . Heart disease Mother   . Hyperlipidemia Mother   . Hypertension Mother   . Cancer Father     lung, prostate  . Hyperlipidemia Father   . Hypertension Father    Social History   Social History  . Marital Status: Married    Spouse Name: N/A  . Number of Children: 2  . Years of Education: N/A   Social History Main Topics  . Smoking status: Never Smoker   . Smokeless tobacco: Never Used  . Alcohol Use: No  . Drug Use: No  . Sexual Activity: Not Asked   Other Topics Concern  . None   Social History Narrative    Outpatient Encounter Prescriptions as of 08/20/2015  Medication Sig  . aspirin 325 MG EC tablet Take 325 mg by mouth daily.  . Calcium Carbonate-Vitamin D (CALCIUM 600+D) 600-200 MG-UNIT TABS Take by mouth 2 (two) times daily.  . cetirizine (ZYRTEC) 10 MG tablet Take 1 tablet (10 mg total) by mouth daily.  .  Coenzyme Q10 (CO Q 10 PO) Take by mouth 2 (two) times daily.  . diphenhydrAMINE (BENADRYL) 25 mg capsule Take 25 mg by mouth at bedtime as needed.  . Hypertonic Nasal Wash (SINUS RINSE NA) Place into the nose daily.  . Omega-3 Fatty Acids (FISH OIL) 1000 MG CAPS Take by mouth 2 (two) times daily.  . Probiotic Product (ALIGN PO) Take by mouth as needed.  . triamcinolone (NASACORT) 55 MCG/ACT nasal inhaler Place 2 sprays into the nose as needed.  . valsartan-hydrochlorothiazide (DIOVAN-HCT) 160-12.5 MG per tablet Take 1 tablet by mouth daily.  . [DISCONTINUED] nitrofurantoin, macrocrystal-monohydrate, (MACROBID) 100 MG capsule Take 1 capsule (100 mg total) by mouth 2 (two) times daily.  Marland Kitchen atorvastatin (LIPITOR) 10 MG tablet Take 1 tablet (10 mg total) by mouth daily.  . rosuvastatin (CRESTOR) 5 MG tablet Take 1 tablet (5 mg total) by mouth daily. (Patient not taking: Reported on 08/20/2015)   No facility-administered encounter medications on file as of 08/20/2015.    Review of Systems  Constitutional: Negative for appetite change and unexpected weight change.  HENT: Negative for congestion and sinus pressure.   Eyes: Negative for discharge and visual disturbance.  Respiratory: Negative for cough, chest  tightness and shortness of breath.   Cardiovascular: Negative for chest pain, palpitations and leg swelling.  Gastrointestinal: Negative for nausea, vomiting, abdominal pain and diarrhea.  Genitourinary: Negative for dysuria and difficulty urinating.  Musculoskeletal:       Persistent right upper arm pain.  Limited rom.    Skin: Negative for color change and rash.  Neurological: Negative for dizziness, light-headedness and headaches.  Psychiatric/Behavioral: Negative for dysphoric mood and agitation.       Objective:    Physical Exam  Constitutional: She appears well-developed and well-nourished. No distress.  HENT:  Nose: Nose normal.  Mouth/Throat: Oropharynx is clear and moist.    Eyes: Conjunctivae are normal. Right eye exhibits no discharge. Left eye exhibits no discharge.  Neck: Neck supple. No thyromegaly present.  Cardiovascular: Normal rate and regular rhythm.   Pulmonary/Chest: Breath sounds normal. No respiratory distress. She has no wheezes.  Abdominal: Soft. Bowel sounds are normal. There is no tenderness.  Musculoskeletal: She exhibits no edema or tenderness.  Increased pain with abduction especially above 90 degrees.    Lymphadenopathy:    She has no cervical adenopathy.  Skin: No rash noted. No erythema.  Psychiatric: She has a normal mood and affect. Her behavior is normal.    BP 120/70 mmHg  Pulse 67  Temp(Src) 98.1 F (36.7 C) (Oral)  Ht 5\' 1"  (1.549 m)  Wt 120 lb 6 oz (54.602 kg)  BMI 22.76 kg/m2  SpO2 97%  LMP 11/28/1998 Wt Readings from Last 3 Encounters:  08/20/15 120 lb 6 oz (54.602 kg)  07/25/15 121 lb 12.8 oz (55.248 kg)  07/18/15 124 lb (56.246 kg)     Lab Results  Component Value Date   WBC 7.0 12/05/2014   HGB 13.1 12/05/2014   HCT 39.4 12/05/2014   PLT 255.0 12/05/2014   GLUCOSE 83 08/14/2015   CHOL 297* 08/14/2015   TRIG 197.0* 08/14/2015   HDL 47.40 08/14/2015   LDLDIRECT 103.0 05/21/2015   LDLCALC 210* 08/14/2015   ALT 20 08/14/2015   AST 21 08/14/2015   NA 141 08/14/2015   K 3.8 08/14/2015   CL 103 08/14/2015   CREATININE 0.77 08/14/2015   BUN 14 08/14/2015   CO2 30 08/14/2015   TSH 1.47 12/05/2014       Assessment & Plan:   Problem List Items Addressed This Visit    Abnormal liver function tests    Abdominal ultrasound ok except for gallstone.  Recent liver panel wnl.  Follow with starting cholesterol medication.        Environmental allergies    Overall stable.  On nasacort and zyrtec.       History of abnormal mammogram    Performed at [facility].  Mammogram 12/18/14 recommended f/u right mammogram.  F/u right mammogram - ok.  Recommended f/u mammogram in one year.        Hypercholesterolemia     Persistent elevated cholesterol.  Discussed with her today.  Start lipitor.  Lab Results  Component Value Date   CHOL 297* 08/14/2015   HDL 47.40 08/14/2015   LDLCALC 210* 08/14/2015   LDLDIRECT 103.0 05/21/2015   TRIG 197.0* 08/14/2015   CHOLHDL 6 08/14/2015        Relevant Medications   atorvastatin (LIPITOR) 10 MG tablet   Other Relevant Orders   Hepatic function panel   Hypertension    Blood pressure under good control.  Continue same medication regimen.  Follow pressures.  Follow metabolic panel.  Cr just checked .77.  Relevant Medications   atorvastatin (LIPITOR) 10 MG tablet   Right arm pain    Persistent s/p fall.  Check xray.  Further w/up pending results.         Other Visit Diagnoses    Encounter for immunization    -  Primary    Right shoulder pain        Relevant Orders    DG Shoulder Right (Completed)        Einar Pheasant, MD

## 2015-08-20 NOTE — Progress Notes (Signed)
Pre-visit discussion using our clinic review tool. No additional management support is needed unless otherwise documented below in the visit note.  

## 2015-08-20 NOTE — Patient Instructions (Signed)
Take lipitor 10mg  - on Monday, Wednesday and Friday.

## 2015-08-22 ENCOUNTER — Other Ambulatory Visit: Payer: Self-pay | Admitting: Internal Medicine

## 2015-08-22 DIAGNOSIS — M25511 Pain in right shoulder: Secondary | ICD-10-CM

## 2015-08-22 NOTE — Progress Notes (Signed)
Order placed for ortho referral.   

## 2015-08-26 ENCOUNTER — Encounter: Payer: Self-pay | Admitting: Internal Medicine

## 2015-08-26 DIAGNOSIS — Z87898 Personal history of other specified conditions: Secondary | ICD-10-CM | POA: Insufficient documentation

## 2015-08-26 DIAGNOSIS — M79601 Pain in right arm: Secondary | ICD-10-CM | POA: Insufficient documentation

## 2015-08-26 NOTE — Assessment & Plan Note (Signed)
Performed at [facility].  Mammogram 12/18/14 recommended f/u right mammogram.  F/u right mammogram - ok.  Recommended f/u mammogram in one year.

## 2015-08-26 NOTE — Assessment & Plan Note (Signed)
Persistent s/p fall.  Check xray.  Further w/up pending results.  

## 2015-08-26 NOTE — Assessment & Plan Note (Signed)
Blood pressure under good control.  Continue same medication regimen.  Follow pressures.  Follow metabolic panel.  Cr just checked .77.

## 2015-08-26 NOTE — Assessment & Plan Note (Signed)
Overall stable.  On nasacort and zyrtec.

## 2015-08-26 NOTE — Assessment & Plan Note (Signed)
Abdominal ultrasound ok except for gallstone.  Recent liver panel wnl.  Follow with starting cholesterol medication.

## 2015-08-26 NOTE — Assessment & Plan Note (Signed)
Persistent elevated cholesterol.  Discussed with her today.  Start lipitor.  Lab Results  Component Value Date   CHOL 297* 08/14/2015   HDL 47.40 08/14/2015   LDLCALC 210* 08/14/2015   LDLDIRECT 103.0 05/21/2015   TRIG 197.0* 08/14/2015   CHOLHDL 6 08/14/2015

## 2015-09-09 ENCOUNTER — Other Ambulatory Visit: Payer: Self-pay | Admitting: Orthopedic Surgery

## 2015-09-09 DIAGNOSIS — M25511 Pain in right shoulder: Secondary | ICD-10-CM

## 2015-09-09 DIAGNOSIS — S46001D Unspecified injury of muscle(s) and tendon(s) of the rotator cuff of right shoulder, subsequent encounter: Secondary | ICD-10-CM

## 2015-09-11 ENCOUNTER — Telehealth: Payer: Self-pay | Admitting: *Deleted

## 2015-09-11 DIAGNOSIS — Z1239 Encounter for other screening for malignant neoplasm of breast: Secondary | ICD-10-CM

## 2015-09-11 NOTE — Telephone Encounter (Signed)
Mammogram order placed (wants to go to [facility])

## 2015-09-17 ENCOUNTER — Ambulatory Visit
Admission: RE | Admit: 2015-09-17 | Discharge: 2015-09-17 | Disposition: A | Discharge status: Home / Self Care | Payer: Medicare Other | Source: Ambulatory Visit | Attending: Orthopedic Surgery | Admitting: Orthopedic Surgery

## 2015-09-17 DIAGNOSIS — X58XXXD Exposure to other specified factors, subsequent encounter: Secondary | ICD-10-CM | POA: Insufficient documentation

## 2015-09-17 DIAGNOSIS — S46001D Unspecified injury of muscle(s) and tendon(s) of the rotator cuff of right shoulder, subsequent encounter: Secondary | ICD-10-CM | POA: Diagnosis present

## 2015-09-17 DIAGNOSIS — M75121 Complete rotator cuff tear or rupture of right shoulder, not specified as traumatic: Secondary | ICD-10-CM | POA: Insufficient documentation

## 2015-09-17 DIAGNOSIS — M25511 Pain in right shoulder: Secondary | ICD-10-CM

## 2015-09-19 ENCOUNTER — Emergency Department
Admission: EM | Admit: 2015-09-19 | Discharge: 2015-09-20 | Disposition: A | Discharge status: Home / Self Care | Payer: Medicare Other | Attending: Emergency Medicine | Admitting: Emergency Medicine

## 2015-09-19 ENCOUNTER — Encounter: Payer: Self-pay | Admitting: *Deleted

## 2015-09-19 DIAGNOSIS — Z7982 Long term (current) use of aspirin: Secondary | ICD-10-CM | POA: Diagnosis not present

## 2015-09-19 DIAGNOSIS — R112 Nausea with vomiting, unspecified: Secondary | ICD-10-CM

## 2015-09-19 DIAGNOSIS — I10 Essential (primary) hypertension: Secondary | ICD-10-CM | POA: Diagnosis not present

## 2015-09-19 DIAGNOSIS — Z79899 Other long term (current) drug therapy: Secondary | ICD-10-CM | POA: Insufficient documentation

## 2015-09-19 DIAGNOSIS — R1013 Epigastric pain: Secondary | ICD-10-CM | POA: Insufficient documentation

## 2015-09-19 DIAGNOSIS — R51 Headache: Secondary | ICD-10-CM | POA: Insufficient documentation

## 2015-09-19 LAB — CBC WITH DIFFERENTIAL/PLATELET
Basophils Absolute: 0.1 10*3/uL (ref 0–0.1)
Basophils Relative: 0 %
EOS ABS: 0.1 10*3/uL (ref 0–0.7)
EOS PCT: 1 %
HCT: 40.1 % (ref 35.0–47.0)
Hemoglobin: 13.5 g/dL (ref 12.0–16.0)
LYMPHS ABS: 0.3 10*3/uL — AB (ref 1.0–3.6)
Lymphocytes Relative: 2 %
MCH: 30.2 pg (ref 26.0–34.0)
MCHC: 33.8 g/dL (ref 32.0–36.0)
MCV: 89.5 fL (ref 80.0–100.0)
MONOS PCT: 1 %
Monocytes Absolute: 0.1 10*3/uL — ABNORMAL LOW (ref 0.2–0.9)
Neutro Abs: 11.6 10*3/uL — ABNORMAL HIGH (ref 1.4–6.5)
Neutrophils Relative %: 96 %
PLATELETS: 202 10*3/uL (ref 150–440)
RBC: 4.48 MIL/uL (ref 3.80–5.20)
RDW: 13.7 % (ref 11.5–14.5)
WBC: 12.2 10*3/uL — AB (ref 3.6–11.0)

## 2015-09-19 LAB — URINALYSIS COMPLETE WITH MICROSCOPIC (ARMC ONLY)
BILIRUBIN URINE: NEGATIVE
Bacteria, UA: NONE SEEN
GLUCOSE, UA: NEGATIVE mg/dL
Hgb urine dipstick: NEGATIVE
Leukocytes, UA: NEGATIVE
NITRITE: NEGATIVE
PROTEIN: NEGATIVE mg/dL
SPECIFIC GRAVITY, URINE: 1.019 (ref 1.005–1.030)
pH: 8 (ref 5.0–8.0)

## 2015-09-19 LAB — COMPREHENSIVE METABOLIC PANEL
ALT: 28 U/L (ref 14–54)
ANION GAP: 9 (ref 5–15)
AST: 38 U/L (ref 15–41)
Albumin: 3.7 g/dL (ref 3.5–5.0)
Alkaline Phosphatase: 69 U/L (ref 38–126)
BUN: 21 mg/dL — ABNORMAL HIGH (ref 6–20)
CHLORIDE: 106 mmol/L (ref 101–111)
CO2: 25 mmol/L (ref 22–32)
Calcium: 9.4 mg/dL (ref 8.9–10.3)
Creatinine, Ser: 0.75 mg/dL (ref 0.44–1.00)
GFR calc non Af Amer: >60 mL/min (ref >60)
Glucose, Bld: 135 mg/dL — ABNORMAL HIGH (ref 65–99)
POTASSIUM: 3.6 mmol/L (ref 3.5–5.1)
SODIUM: 140 mmol/L (ref 135–145)
Total Bilirubin: 0.4 mg/dL (ref 0.3–1.2)
Total Protein: 7 g/dL (ref 6.5–8.1)

## 2015-09-19 LAB — LIPASE, BLOOD: Lipase: 23 U/L (ref 22–51)

## 2015-09-19 MED ORDER — PROMETHAZINE HCL 25 MG/ML IJ SOLN
12.5000 mg | Freq: Once | INTRAMUSCULAR | Status: AC
  Administered 2015-09-19: 12.5 mg via INTRAVENOUS
  Filled 2015-09-19: qty 1

## 2015-09-19 MED ORDER — ONDANSETRON 4 MG PO TBDP
4.0000 mg | ORAL_TABLET | Freq: Three times a day (TID) | ORAL | Status: DC | PRN
Start: 2015-09-19 — End: 2015-11-25

## 2015-09-19 MED ORDER — SODIUM CHLORIDE 0.9 % IV BOLUS (SEPSIS)
1000.0000 mL | Freq: Once | INTRAVENOUS | Status: AC
  Administered 2015-09-19: 1000 mL via INTRAVENOUS

## 2015-09-19 MED ORDER — PROMETHAZINE HCL 25 MG/ML IJ SOLN: 25.0000 mg | Freq: Once | INTRAMUSCULAR | Status: DC

## 2015-09-19 MED ORDER — MORPHINE SULFATE (PF) 4 MG/ML IV SOLN
4.0000 mg | Freq: Once | INTRAVENOUS | Status: AC
  Administered 2015-09-19: 4 mg via INTRAVENOUS
  Filled 2015-09-19: qty 1

## 2015-09-19 NOTE — ED Provider Notes (Signed)
[facility] Emergency Department Provider Note  Time seen: 9:34 PM  I have reviewed the triage vital signs and the nursing notes.   HISTORY  Chief Complaint Abdominal Pain and Emesis    HPI Jamie Elliott is a 69 y.o. female with a past medical history of hypertension, hyperlipidemia, migraines presents the emergency department nausea/vomiting, as well as intermittent headache.Patient states since yesterday afternoon she has been nauseated with occasional vomiting. Also states epigastric abdominal pain which started after the vomiting episodes. Feels very nauseated, unable to eat due to nausea and decreased appetite. Denies any diarrhea or constipation, last bowel movement was yesterday and was normal. Denies any black or bloody stool. Denies any dysuria. Denies fever. States the abdominal pain is mild, worse when she dry heaves.     Past Medical History  Diagnosis Date  . Hypertension   . Hypercholesterolemia   . Migraines   . Environmental allergies     Patient Active Problem List   Diagnosis Date Noted  . Right arm pain 08/26/2015  . History of abnormal mammogram 08/26/2015  . Leg pain 05/19/2015  . Health care maintenance 05/19/2015  . Acute upper respiratory infection 12/13/2014  . Dysuria 08/19/2014  . Osteoporosis 04/17/2014  . Sinusitis 03/22/2014  . Abnormal liver function tests 12/17/2013  . Pain in joint of right wrist 02/23/2013  . Hypertension 12/04/2012  . Hypercholesterolemia 12/04/2012  . History of frequent urinary tract infections 12/04/2012  . Environmental allergies 12/04/2012    Past Surgical History  Procedure Laterality Date  . Septoplasty    . Abdominal hysterectomy  2000  . Appendectomy  2000  . Nose surgery  1967    Current Outpatient Rx  Name  Route  Sig  Dispense  Refill  . aspirin 325 MG EC tablet   Oral   Take 325 mg by mouth daily.         Marland Kitchen atorvastatin (LIPITOR) 10 MG tablet   Oral   Take 1 tablet  (10 mg total) by mouth daily.   30 tablet   1   . Calcium Carbonate-Vitamin D (CALCIUM 600+D) 600-200 MG-UNIT TABS   Oral   Take by mouth 2 (two) times daily.         . cetirizine (ZYRTEC) 10 MG tablet   Oral   Take 1 tablet (10 mg total) by mouth daily.   30 tablet   11   . Coenzyme Q10 (CO Q 10 PO)   Oral   Take by mouth 2 (two) times daily.         . diphenhydrAMINE (BENADRYL) 25 mg capsule   Oral   Take 25 mg by mouth at bedtime as needed.         . Hypertonic Nasal Wash (SINUS RINSE NA)   Nasal   Place into the nose daily.         . Omega-3 Fatty Acids (FISH OIL) 1000 MG CAPS   Oral   Take by mouth 2 (two) times daily.         . Probiotic Product (ALIGN PO)   Oral   Take by mouth as needed.         . rosuvastatin (CRESTOR) 5 MG tablet   Oral   Take 1 tablet (5 mg total) by mouth daily. Patient not taking: Reported on 08/20/2015   30 tablet   2   . triamcinolone (NASACORT) 55 MCG/ACT nasal inhaler   Nasal   Place 2 sprays into  the nose as needed.   1 Inhaler   5   . valsartan-hydrochlorothiazide (DIOVAN-HCT) 160-12.5 MG per tablet   Oral   Take 1 tablet by mouth daily.   30 tablet   5     Allergies Zithromax and Augmentin  Family History  Problem Relation Age of Onset  . Heart disease Mother   . Hyperlipidemia Mother   . Hypertension Mother   . Cancer Father     lung, prostate  . Hyperlipidemia Father   . Hypertension Father     Social History Social History  Substance Use Topics  . Smoking status: Never Smoker   . Smokeless tobacco: Never Used  . Alcohol Use: No    Review of Systems Constitutional: Negative for fever. Cardiovascular: Negative for chest pain. Respiratory: Negative for shortness of breath. Gastrointestinal: Mild epigastric pain. Positive for nausea and vomiting. Negative for diarrhea or constipation. Genitourinary: Negative for dysuria. Neurological: Intermittent headache, denies focal weakness or  numbness. 10-point ROS otherwise negative.  ____________________________________________   PHYSICAL EXAM:  VITAL SIGNS: ED Triage Vitals  Enc Vitals Group     BP 09/19/15 2010 143/71 mmHg     Pulse Rate 09/19/15 2010 96     Resp 09/19/15 2010 20     Temp 09/19/15 2010 99.6 F (37.6 C)     Temp Source 09/19/15 2010 Oral     SpO2 09/19/15 2010 98 %     Weight 09/19/15 2010 120 lb (54.432 kg)     Height 09/19/15 2010 5\' 2"  (1.575 m)     Head Cir --      Peak Flow --      Pain Score 09/19/15 2012 8     Pain Loc --      Pain Edu? --      Excl. in [place]? --     Constitutional: Alert and oriented. Well appearing and in no distress. Eyes: Normal exam ENT   Head: Normocephalic and atraumatic.   Mouth/Throat: Mucous membranes are moist. Cardiovascular: Normal rate, regular rhythm. No murmurs, rubs, or gallops. Respiratory: Normal respiratory effort without tachypnea nor retractions. Breath sounds are clear  Gastrointestinal: Soft, mild/minimal epigastric tenderness palpation. No rebound or guarding. No distention. Musculoskeletal: Nontender with normal range of motion in all extremities.  Neurologic:  Normal speech and language. No gross focal neurologic deficits are appreciated. Speech is normal. Equal grip strengths bilaterally. No photophobia elicited. Skin:  Skin is warm, dry and intact.  Psychiatric: Mood and affect are normal. Speech and behavior are normal. ____________________________________________     INITIAL IMPRESSION / ASSESSMENT AND PLAN / ED COURSE  Pertinent labs & imaging results that were available during my care of the patient were reviewed by me and considered in my medical decision making (see chart for details).  Patient with nausea and vomiting since last night. States she was watching her grandchildren earlier this week, and believes she caught the virus that they had. Patient feels nauseated, minimal abdominal tenderness on exam. We will treat  nausea, IV hydrate, treat discomfort, and obtain lab work to help further evaluate.  Labs are largely within normal limits. Patient states she feels much better after treatment. We will allow the rest of the IV fluid to infuse, we will by mouth trial, and monitor in the emergency department. As long as patient is able to tolerate oral we will discharge home with Zofran ODT and PCP follow-up. I discussed this plan of care with the patient who is agreeable.  ____________________________________________  FINAL CLINICAL IMPRESSION(S) / ED DIAGNOSES  Nausea and vomiting   Harvest Dark, MD 09/19/15 [id number]

## 2015-09-19 NOTE — Discharge Instructions (Signed)
Please take nausea medication as needed, as prescribed. Please drink plenty of fluids over the next several days. Follow up with her primary care physician tomorrow. Return to the emergency department for any abdominal pain, fever, or worsening nausea/vomiting.   Nausea and Vomiting Nausea is a sick feeling that often comes before throwing up (vomiting). Vomiting is a reflex where stomach contents come out of your mouth. Vomiting can cause severe loss of body fluids (dehydration). Children and elderly adults can become dehydrated quickly, especially if they also have diarrhea. Nausea and vomiting are symptoms of a condition or disease. It is important to find the cause of your symptoms. CAUSES   Direct irritation of the stomach lining. This irritation can result from increased acid production (gastroesophageal reflux disease), infection, food poisoning, taking certain medicines (such as nonsteroidal anti-inflammatory drugs), alcohol use, or tobacco use.  Signals from the brain.These signals could be caused by a headache, heat exposure, an inner ear disturbance, increased pressure in the brain from injury, infection, a tumor, or a concussion, pain, emotional stimulus, or metabolic problems.  An obstruction in the gastrointestinal tract (bowel obstruction).  Illnesses such as diabetes, hepatitis, gallbladder problems, appendicitis, kidney problems, cancer, sepsis, atypical symptoms of a heart attack, or eating disorders.  Medical treatments such as chemotherapy and radiation.  Receiving medicine that makes you sleep (general anesthetic) during surgery. DIAGNOSIS Your caregiver may ask for tests to be done if the problems do not improve after a few days. Tests may also be done if symptoms are severe or if the reason for the nausea and vomiting is not clear. Tests may include:  Urine tests.  Blood tests.  Stool tests.  Cultures (to look for evidence of infection).  X-rays or other imaging  studies. Test results can help your caregiver make decisions about treatment or the need for additional tests. TREATMENT You need to stay well hydrated. Drink frequently but in small amounts.You may wish to drink water, sports drinks, clear broth, or eat frozen ice pops or gelatin dessert to help stay hydrated.When you eat, eating slowly may help prevent nausea.There are also some antinausea medicines that may help prevent nausea. HOME CARE INSTRUCTIONS   Take all medicine as directed by your caregiver.  If you do not have an appetite, do not force yourself to eat. However, you must continue to drink fluids.  If you have an appetite, eat a normal diet unless your caregiver tells you differently.  Eat a variety of complex carbohydrates (rice, wheat, potatoes, bread), lean meats, yogurt, fruits, and vegetables.  Avoid high-fat foods because they are more difficult to digest.  Drink enough water and fluids to keep your urine clear or pale yellow.  If you are dehydrated, ask your caregiver for specific rehydration instructions. Signs of dehydration may include:  Severe thirst.  Dry lips and mouth.  Dizziness.  Dark urine.  Decreasing urine frequency and amount.  Confusion.  Rapid breathing or pulse. SEEK IMMEDIATE MEDICAL CARE IF:   You have blood or brown flecks (like coffee grounds) in your vomit.  You have black or bloody stools.  You have a severe headache or stiff neck.  You are confused.  You have severe abdominal pain.  You have chest pain or trouble breathing.  You do not urinate at least once every 8 hours.  You develop cold or clammy skin.  You continue to vomit for longer than 24 to 48 hours.  You have a fever. MAKE SURE YOU:   Understand these  instructions.  Will watch your condition.  Will get help right away if you are not doing well or get worse. Document Released: 12/07/2005 Document Revised: 02/29/2012 Document Reviewed:  05/06/2011 [facility] Patient Information 2015 [place], [place]. This information is not intended to replace advice given to you by your health care provider. Make sure you discuss any questions you have with your health care provider.

## 2015-09-19 NOTE — ED Notes (Addendum)
Pt to triage via wheelchair.  Pt has abd pain with vomiting since yesterday.  Now, pt has dry heaves.  Pt has low back pain.  Denies urinary sx.  Pt also reports a headache.  Taking tylenol without relief.  Pt alert.  Speech clear.

## 2015-09-20 NOTE — ED Provider Notes (Signed)
-----------------------------------------   1:01 AM on 09/20/2015 -----------------------------------------  Patient ate and drank without vomiting. Feels overall much better. Strict return precautions given. Patient and spouse verbalize understanding and agree with plan of care.  Paulette Blanch, MD 09/20/15 [id number]

## 2015-09-30 ENCOUNTER — Other Ambulatory Visit (INDEPENDENT_AMBULATORY_CARE_PROVIDER_SITE_OTHER): Payer: Medicare Other

## 2015-09-30 ENCOUNTER — Ambulatory Visit (INDEPENDENT_AMBULATORY_CARE_PROVIDER_SITE_OTHER): Payer: Medicare Other

## 2015-09-30 VITALS — BP 122/72 | HR 86 | Temp 97.4°F | Resp 12 | Ht 62.0 in | Wt 118.6 lb

## 2015-09-30 DIAGNOSIS — Z0000 Encounter for general adult medical examination without abnormal findings: Secondary | ICD-10-CM

## 2015-09-30 DIAGNOSIS — E7800 Pure hypercholesterolemia, unspecified: Secondary | ICD-10-CM | POA: Diagnosis not present

## 2015-09-30 LAB — HEPATIC FUNCTION PANEL
ALT: 28 U/L (ref 0–35)
AST: 29 U/L (ref 0–37)
Albumin: 3.9 g/dL (ref 3.5–5.2)
Alkaline Phosphatase: 72 U/L (ref 39–117)
Bilirubin, Direct: 0 mg/dL (ref 0.0–0.3)
Total Bilirubin: 0.3 mg/dL (ref 0.2–1.2)
Total Protein: 6.8 g/dL (ref 6.0–8.3)

## 2015-09-30 NOTE — Patient Instructions (Addendum)
[name],  Thank you for taking time to come for your Medicare Wellness Visit.  I appreciate your ongoing commitment to your health goals. Please review the following plan we discussed and let me know if I can assist you in the future.  Bring a copy of advanced directives  Pneumococcal 13 postponed, Follow Up with PCP  Hep C Screening completed   Fat and Cholesterol Restricted Diet High levels of fat and cholesterol in your blood may lead to various health problems, such as diseases of the heart, blood vessels, gallbladder, liver, and pancreas. Fats are concentrated sources of energy that come in various forms. Certain types of fat, including saturated fat, may be harmful in excess. Cholesterol is a substance needed by your body in small amounts. Your body makes all the cholesterol it needs. Excess cholesterol comes from the food you eat. When you have high levels of cholesterol and saturated fat in your blood, health problems can develop because the excess fat and cholesterol will gather along the walls of your blood vessels, causing them to narrow. Choosing the right foods will help you control your intake of fat and cholesterol. This will help keep the levels of these substances in your blood within normal limits and reduce your risk of disease. WHAT IS MY PLAN? Your health care provider recommends that you:  Get no more than __________ % of the total calories in your daily diet from fat.  Limit your intake of saturated fat to less than ______% of your total calories each day.  Limit the amount of cholesterol in your diet to less than _________mg per day. WHAT TYPES OF FAT SHOULD I CHOOSE?  Choose healthy fats more often. Choose monounsaturated and polyunsaturated fats, such as olive and canola oil, flaxseeds, walnuts, almonds, and seeds.  Eat more omega-3 fats. Good choices include salmon, mackerel, sardines, tuna, flaxseed oil, and ground flaxseeds. Aim to eat fish at least two times  a week.  Limit saturated fats. Saturated fats are primarily found in animal products, such as meats, butter, and cream. Plant sources of saturated fats include palm oil, palm kernel oil, and coconut oil.  Avoid foods with partially hydrogenated oils in them. These contain trans fats. Examples of foods that contain trans fats are stick margarine, some tub margarines, cookies, crackers, and other baked goods. WHAT GENERAL GUIDELINES DO I NEED TO FOLLOW? These guidelines for healthy eating will help you control your intake of fat and cholesterol:  Check food labels carefully to identify foods with trans fats or high amounts of saturated fat.  Fill one half of your plate with vegetables and green salads.  Fill one fourth of your plate with whole grains. Look for the word "whole" as the first word in the ingredient list.  Fill one fourth of your plate with lean protein foods.  Limit fruit to two servings a day. Choose fruit instead of juice.  Eat more foods that contain soluble fiber. Examples of foods that contain this type of fiber are apples, broccoli, carrots, beans, peas, and barley. Aim to get 20-30 g of fiber per day.  Eat more home-cooked food and less restaurant, buffet, and fast food.  Limit or avoid alcohol.  Limit foods high in starch and sugar.  Limit fried foods.  Cook foods using methods other than frying. Baking, boiling, grilling, and broiling are all great options.  Lose weight if you are overweight. Losing just 5-10% of your initial body weight can help your overall health and  prevent diseases such as diabetes and heart disease. WHAT FOODS CAN I EAT? Grains Whole grains, such as whole wheat or whole grain breads, crackers, cereals, and pasta. Unsweetened oatmeal, bulgur, barley, quinoa, or brown rice. Corn or whole wheat flour tortillas. Vegetables Fresh or frozen vegetables (raw, steamed, roasted, or grilled). Green salads. Fruits All fresh, canned (in natural  juice), or frozen fruits. Meat and Other Protein Products Ground beef (85% or leaner), grass-fed beef, or beef trimmed of fat. Skinless chicken or [place]. Ground chicken or [place]. Pork trimmed of fat. All fish and seafood. Eggs. Dried beans, peas, or lentils. Unsalted nuts or seeds. Unsalted canned or dry beans. Dairy Low-fat dairy products, such as skim or 1% milk, 2% or reduced-fat cheeses, low-fat ricotta or cottage cheese, or plain low-fat yogurt. Fats and Oils Tub margarines without trans fats. Light or reduced-fat mayonnaise and salad dressings. Avocado. Olive, canola, sesame, or safflower oils. Natural peanut or almond butter (choose ones without added sugar and oil). The items listed above may not be a complete list of recommended foods or beverages. Contact your dietitian for more options. WHAT FOODS ARE NOT RECOMMENDED? Grains White bread. White pasta. White rice. Cornbread. Bagels, pastries, and croissants. Crackers that contain trans fat. Vegetables White potatoes. Corn. Creamed or fried vegetables. Vegetables in a cheese sauce. Fruits Dried fruits. Canned fruit in light or heavy syrup. Fruit juice. Meat and Other Protein Products Fatty cuts of meat. Ribs, chicken wings, bacon, sausage, bologna, salami, chitterlings, fatback, hot dogs, bratwurst, and packaged luncheon meats. Liver and organ meats. Dairy Whole or 2% milk, cream, half-and-half, and cream cheese. Whole milk cheeses. Whole-fat or sweetened yogurt. Full-fat cheeses. Nondairy creamers and whipped toppings. Processed cheese, cheese spreads, or cheese curds. Sweets and Desserts Corn syrup, sugars, honey, and molasses. Candy. Jam and jelly. Syrup. Sweetened cereals. Cookies, pies, cakes, donuts, muffins, and ice cream. Fats and Oils Butter, stick margarine, lard, shortening, ghee, or bacon fat. Coconut, palm kernel, or palm oils. Beverages Alcohol. Sweetened drinks (such as sodas, lemonade, and fruit drinks or  punches). The items listed above may not be a complete list of foods and beverages to avoid. Contact your dietitian for more information.   This information is not intended to replace advice given to you by your health care provider. Make sure you discuss any questions you have with your health care provider.   Document Released: 12/07/2005 Document Revised: 12/28/2014 Document Reviewed: 03/07/2014 Elsevier Interactive Patient Education 2016 [place] in the Home  Falls can cause injuries. They can happen to people of all ages. There are many things you can do to make your home safe and to help prevent falls.  WHAT CAN I DO ON THE OUTSIDE OF MY HOME?  Regularly fix the edges of walkways and driveways and fix any cracks.  Remove anything that might make you trip as you walk through a door, such as a raised step or threshold.  Trim any bushes or trees on the path to your home.  Use bright outdoor lighting.  Clear any walking paths of anything that might make someone trip, such as rocks or tools.  Regularly check to see if handrails are loose or broken. Make sure that both sides of any steps have handrails.  Any raised decks and porches should have guardrails on the edges.  Have any leaves, snow, or ice cleared regularly.  Use sand or salt on walking paths during winter.  Clean up any spills in your garage  right away. This includes oil or grease spills. WHAT CAN I DO IN THE BATHROOM?   Use night lights.  Install grab bars by the toilet and in the tub and shower. Do not use towel bars as grab bars.  Use non-skid mats or decals in the tub or shower.  If you need to sit down in the shower, use a plastic, non-slip stool.  Keep the floor dry. Clean up any water that spills on the floor as soon as it happens.  Remove soap buildup in the tub or shower regularly.  Attach bath mats securely with double-sided non-slip rug tape.  Do not have throw rugs and other  things on the floor that can make you trip. WHAT CAN I DO IN THE BEDROOM?  Use night lights.  Make sure that you have a light by your bed that is easy to reach.  Do not use any sheets or blankets that are too big for your bed. They should not hang down onto the floor.  Have a firm chair that has side arms. You can use this for support while you get dressed.  Do not have throw rugs and other things on the floor that can make you trip. WHAT CAN I DO IN THE KITCHEN?  Clean up any spills right away.  Avoid walking on wet floors.  Keep items that you use a lot in easy-to-reach places.  If you need to reach something above you, use a strong step stool that has a grab bar.  Keep electrical cords out of the way.  Do not use floor polish or wax that makes floors slippery. If you must use wax, use non-skid floor wax.  Do not have throw rugs and other things on the floor that can make you trip. WHAT CAN I DO WITH MY STAIRS?  Do not leave any items on the stairs.  Make sure that there are handrails on both sides of the stairs and use them. Fix handrails that are broken or loose. Make sure that handrails are as long as the stairways.  Check any carpeting to make sure that it is firmly attached to the stairs. Fix any carpet that is loose or worn.  Avoid having throw rugs at the top or bottom of the stairs. If you do have throw rugs, attach them to the floor with carpet tape.  Make sure that you have a light switch at the top of the stairs and the bottom of the stairs. If you do not have them, ask someone to add them for you. WHAT ELSE CAN I DO TO HELP PREVENT FALLS?  Wear shoes that:  Do not have high heels.  Have rubber bottoms.  Are comfortable and fit you well.  Are closed at the toe. Do not wear sandals.  If you use a stepladder:  Make sure that it is fully opened. Do not climb a closed stepladder.  Make sure that both sides of the stepladder are locked into place.  Ask  someone to hold it for you, if possible.  Clearly mark and make sure that you can see:  Any grab bars or handrails.  First and last steps.  Where the edge of each step is.  Use tools that help you move around (mobility aids) if they are needed. These include:  Canes.  Walkers.  Scooters.  Crutches.  Turn on the lights when you go into a dark area. Replace any light bulbs as soon as they burn out.  Set  up your furniture so you have a clear path. Avoid moving your furniture around.  If any of your floors are uneven, fix them.  If there are any pets around you, be aware of where they are.  Review your medicines with your doctor. Some medicines can make you feel dizzy. This can increase your chance of falling. Ask your doctor what other things that you can do to help prevent falls.   This information is not intended to replace advice given to you by your health care provider. Make sure you discuss any questions you have with your health care provider.   Document Released: 10/03/2009 Document Revised: 04/23/2015 Document Reviewed: 01/11/2015 Elsevier Interactive Patient Education [employer].

## 2015-09-30 NOTE — Progress Notes (Signed)
Subjective:   Jamie Elliott is a 69 y.o. female who presents for an Initial Medicare Annual Wellness Visit.  Review of Systems    No ROS.  Medicare Wellness Visit.  Cardiac Risk Factors include: advanced age (>48men, >101 women)     Objective:    Today's Vitals   09/30/15 0950  BP: 122/72  Pulse: 86  Temp: 97.4 F (36.3 C)  TempSrc: Oral  Resp: 12  Height: 5\' 2"  (1.575 m)  Weight: 118 lb 9.6 oz (53.797 kg)  SpO2: 99%    Current Medications (verified) Outpatient Encounter Prescriptions as of 09/30/2015  Medication Sig  . aspirin 325 MG EC tablet Take 325 mg by mouth daily.  Marland Kitchen atorvastatin (LIPITOR) 10 MG tablet Take 1 tablet (10 mg total) by mouth daily.  . Calcium Carbonate-Vitamin D (CALCIUM 600+D) 600-200 MG-UNIT TABS Take by mouth 2 (two) times daily.  . cetirizine (ZYRTEC) 10 MG tablet Take 1 tablet (10 mg total) by mouth daily.  . Coenzyme Q10 (CO Q 10 PO) Take by mouth 2 (two) times daily.  . diphenhydrAMINE (BENADRYL) 25 mg capsule Take 25 mg by mouth at bedtime as needed.  . Hypertonic Nasal Wash (SINUS RINSE NA) Place into the nose daily.  . Omega-3 Fatty Acids (FISH OIL) 1000 MG CAPS Take by mouth 2 (two) times daily.  . ondansetron (ZOFRAN ODT) 4 MG disintegrating tablet Take 1 tablet (4 mg total) by mouth every 8 (eight) hours as needed for nausea or vomiting.  . Probiotic Product (ALIGN PO) Take by mouth as needed.  . triamcinolone (NASACORT) 55 MCG/ACT nasal inhaler Place 2 sprays into the nose as needed.  . valsartan-hydrochlorothiazide (DIOVAN-HCT) 160-12.5 MG per tablet Take 1 tablet by mouth daily.  . [DISCONTINUED] rosuvastatin (CRESTOR) 5 MG tablet Take 1 tablet (5 mg total) by mouth daily. (Patient not taking: Reported on 08/20/2015)   No facility-administered encounter medications on file as of 09/30/2015.    Allergies (verified) Zithromax and Augmentin   History: Past Medical History  Diagnosis Date  . Hypertension   . Hypercholesterolemia    . Migraines   . Environmental allergies    Past Surgical History  Procedure Laterality Date  . Septoplasty    . Abdominal hysterectomy  2000  . Appendectomy  2000  . Nose surgery  1967   Family History  Problem Relation Age of Onset  . Heart disease Mother   . Hyperlipidemia Mother   . Hypertension Mother   . Cancer Father     lung, prostate  . Hyperlipidemia Father   . Hypertension Father    Social History   Occupational History  . Not on file.   Social History Main Topics  . Smoking status: Never Smoker   . Smokeless tobacco: Never Used  . Alcohol Use: No  . Drug Use: No  . Sexual Activity: Yes    Tobacco Counseling Counseling given: Not Answered   Activities of Daily Living In your present state of health, do you have any difficulty performing the following activities: 09/30/2015  Hearing? N  Vision? N  Difficulty concentrating or making decisions? N  Walking or climbing stairs? N  Dressing or bathing? N  Doing errands, shopping? N  Preparing Food and eating ? N  Using the Toilet? N  In the past six months, have you accidently leaked urine? N  Do you have problems with loss of bowel control? N  Managing your Medications? N  Managing your Finances? N  Housekeeping or  managing your Housekeeping? N    Immunizations and Health Maintenance Immunization History  Administered Date(s) Administered  . Influenza Split 10/27/2012  . Influenza,inj,Quad PF,36+ Mos 08/16/2014, 08/20/2015  . Pneumococcal Polysaccharide-23 06/05/2013  . Tdap 01/11/2013  . Zoster 06/04/2011   There are no preventive care reminders to display for this patient.  Patient Care Team: Einar Pheasant, MD as PCP - General (Internal Medicine)  Indicate any recent Medical Services you may have received from other than [facility] providers in the past year (date may be approximate).     Assessment:   This is a routine wellness examination for Jamie Elliott.  The goal of the wellness visit is to  assist the patient how to close the gaps in care and create a preventative care plan for the patient.   Calcium and Vit D as appropriate/ Osteoporosis risk reviewed.  Taking meds without issues; no barriers identified.  Safety issues reviewed; smoke detectors in the home. Firearms locked in a secure area. Wears seatbelts when driving or riding with others. No violence in the home.  No identified risk were noted; The patient was oriented x 3; appropriate in dress and manner and no objective failures at ADL's or IADL's.   Hep C Screening completed today.   Hearing/Vision screen  Hearing Screening   Method: Audiometry   125Hz  250Hz  500Hz  1000Hz  2000Hz  4000Hz  8000Hz   Right ear:   Pass Pass Pass Pass   Left ear:   Pass Pass Pass Pass     Visual Acuity Screening   Right eye Left eye Both eyes  Without correction:     With correction: 20/50 20/20 20/20   Comments: Followed by [facility], Dr. Mordecai Rasmussen.  Wears glasses.     Dietary issues and exercise activities discussed: Current Exercise Habits:: The patient does not participate in regular exercise at present (Currently plays with small grandchildren.  Some walking and lifting.), Type of exercise: walking, Frequency (Times/Week): 5, Intensity: Mild  Goals    . Increase physical activity     Desire to join a gym and participate in water aerobics. Walk more and eat a healthier diet.  Maintain current weight.    . Increase water intake     Currently drinking 3 glasses of water daily.  Desire to increase water intake up to 4 glasses daily.        Depression Screen PHQ 2/9 Scores 09/30/2015 05/15/2015 04/16/2014 01/15/2013  PHQ - 2 Score 0 0 0 0    Fall Risk Fall Risk  09/30/2015 05/15/2015 04/16/2014 01/15/2013 12/04/2012  Falls in the past year? Yes No No No No  Number falls in past yr: 1 - - - -  Injury with Fall? Yes - - - -  Follow up Education provided;Falls prevention discussed - - - -    Cognitive Function: MMSE  - Mini Mental State Exam 09/30/2015  Orientation to time 5  Orientation to Place 5  Registration 3  Attention/ Calculation 5  Recall 3  Language- name 2 objects 2  Language- repeat 1  Language- follow 3 step command 3  Language- read & follow direction 1  Write a sentence 1  Copy design 1  Total score 30    Screening Tests Health Maintenance  Topic Date Due  . PNA vac Low Risk Adult (2 of 2 - PCV13) 11/19/2015 (Originally 06/05/2014)  . INFLUENZA VACCINE  07/21/2016  . MAMMOGRAM  12/20/2016  . COLONOSCOPY  12/10/2021  . TETANUS/TDAP  01/11/2023  . DEXA SCAN  Completed  .  ZOSTAVAX  Completed  . Hepatitis C Screening  Completed      Plan:    End of life planning was discussed; aging in home or other; plans to return copy of HCPOA/Living Will.  Reschedule at a later date for fasting labs previously ordered.  Pneumococcal 13 vaccine postponed, per patient request.    Patient concerns: Discuss PCV13 with PCP due to bad reaction after Pneumococcal 23 administration.    R side ABD localized, non-raised redness.  No swelling.  Onset x1 week. Denies pain or itch.  Not tender to the touch.  Deferred to f/u with PCP at scheduled upcoming visit or sooner if condition worsens.  During the course of the visit, Jamie Elliott was educated and counseled about the following appropriate screening and preventive services:   Vaccines to include Pneumoccal, Influenza, Hepatitis B, Td, Zostavax, HCV  Electrocardiogram  Cardiovascular disease screening  Colorectal cancer screening  Bone density screening  Diabetes screening  Glaucoma screening  Mammography/PAP  Nutrition counseling  Smoking cessation counseling  Patient Instructions (the written plan) were given to the patient.    Varney Biles, LPN   [id number]     Note reviewed.  Agree with plan.    Dr Nicki Reaper

## 2015-10-01 ENCOUNTER — Encounter: Payer: Self-pay | Admitting: *Deleted

## 2015-10-01 LAB — HEPATITIS C ANTIBODY: HCV Ab: NEGATIVE

## 2015-10-16 ENCOUNTER — Other Ambulatory Visit: Payer: Self-pay | Admitting: Internal Medicine

## 2015-10-31 ENCOUNTER — Ambulatory Visit (INDEPENDENT_AMBULATORY_CARE_PROVIDER_SITE_OTHER): Payer: Medicare Other | Admitting: Nurse Practitioner

## 2015-10-31 ENCOUNTER — Encounter: Payer: Self-pay | Admitting: Nurse Practitioner

## 2015-10-31 VITALS — BP 122/62 | HR 82 | Temp 98.1°F | Wt 119.0 lb

## 2015-10-31 DIAGNOSIS — J329 Chronic sinusitis, unspecified: Secondary | ICD-10-CM

## 2015-10-31 MED ORDER — CEPHALEXIN 500 MG PO CAPS: 500.0000 mg | ORAL_CAPSULE | Freq: Two times a day (BID) | ORAL | Status: DC

## 2015-10-31 NOTE — Progress Notes (Signed)
Patient ID: LIESELOTTE HESSELTINE, female    DOB: April 15, 1946  Age: 69 y.o. MRN: [id number]  CC: Sinusitis   HPI NICK FRONHEISER presents for sinusitis x 1 week.   1) Sinusitis x 1 week- nasal congestion, sore throat (mainly right sided), glands feel swollen, laryngitis, cough with yellow mucous   Allergy OTC medications and benadryl at night   History Keziyah has a past medical history of Hypertension; Hypercholesterolemia; Migraines; and Environmental allergies.   She has past surgical history that includes Septoplasty; Abdominal hysterectomy (2000); Appendectomy (2000); and Nose surgery (1967).   Her family history includes Cancer in her father; Heart disease in her mother; Hyperlipidemia in her father and mother; Hypertension in her father and mother.She reports that she has never smoked. She has never used smokeless tobacco. She reports that she does not drink alcohol or use illicit drugs.  Outpatient Prescriptions Prior to Visit  Medication Sig Dispense Refill  . aspirin 325 MG EC tablet Take 325 mg by mouth daily.    Marland Kitchen atorvastatin (LIPITOR) 10 MG tablet Take 1 tablet (10 mg total) by mouth daily. 30 tablet 5  . Calcium Carbonate-Vitamin D (CALCIUM 600+D) 600-200 MG-UNIT TABS Take by mouth 2 (two) times daily.    . cetirizine (ZYRTEC) 10 MG tablet Take 1 tablet (10 mg total) by mouth daily. 30 tablet 11  . Coenzyme Q10 (CO Q 10 PO) Take by mouth 2 (two) times daily.    . diphenhydrAMINE (BENADRYL) 25 mg capsule Take 25 mg by mouth at bedtime as needed.    . Hypertonic Nasal Wash (SINUS RINSE NA) Place into the nose daily.    . Omega-3 Fatty Acids (FISH OIL) 1000 MG CAPS Take by mouth 2 (two) times daily.    . ondansetron (ZOFRAN ODT) 4 MG disintegrating tablet Take 1 tablet (4 mg total) by mouth every 8 (eight) hours as needed for nausea or vomiting. 20 tablet 0  . Probiotic Product (ALIGN PO) Take by mouth as needed.    . triamcinolone (NASACORT) 55 MCG/ACT nasal inhaler Place 2 sprays into  the nose as needed. 1 Inhaler 5  . valsartan-hydrochlorothiazide (DIOVAN-HCT) 160-12.5 MG per tablet Take 1 tablet by mouth daily. 30 tablet 5   No facility-administered medications prior to visit.    ROS Review of Systems  Constitutional: Negative for fever, chills, diaphoresis and fatigue.  HENT: Positive for congestion, ear pain, postnasal drip, rhinorrhea, sinus pressure and voice change. Negative for ear discharge, hearing loss, mouth sores, sneezing and sore throat.   Respiratory: Positive for cough. Negative for chest tightness, shortness of breath and wheezing.   Cardiovascular: Negative for chest pain, palpitations and leg swelling.  Gastrointestinal: Negative for nausea, vomiting and diarrhea.  Skin: Negative for rash.  Neurological: Negative for dizziness, weakness and headaches.    Objective:  BP 122/62 mmHg  Pulse 82  Temp(Src) 98.1 F (36.7 C) (Oral)  Wt 119 lb (53.978 kg)  SpO2 95%  LMP 11/28/1998  Physical Exam  Constitutional: She is oriented to person, place, and time. She appears well-developed and well-nourished. No distress.  HENT:  Head: Normocephalic and atraumatic.  Right Ear: External ear normal.  Left Ear: External ear normal.  TM's clear bilaterally  Eyes: EOM are normal. Pupils are equal, round, and reactive to light. Right eye exhibits no discharge. Left eye exhibits no discharge. No scleral icterus.  Cardiovascular: Normal rate, regular rhythm and normal heart sounds.  Exam reveals no gallop and no friction rub.   No  murmur heard. Pulmonary/Chest: Effort normal and breath sounds normal. No respiratory distress. She has no wheezes. She has no rales. She exhibits no tenderness.  Neurological: She is alert and oriented to person, place, and time. No cranial nerve deficit. She exhibits normal muscle tone. Coordination normal.  Skin: Skin is warm and dry. No rash noted. She is not diaphoretic.  Psychiatric: She has a normal mood and affect. Her behavior  is normal. Judgment and thought content normal.   Assessment & Plan:   Elleni was seen today for sinusitis.  Diagnoses and all orders for this visit:  Sinusitis, unspecified chronicity, unspecified location  Other orders -     cephALEXin (KEFLEX) 500 MG capsule; Take 1 capsule (500 mg total) by mouth 2 (two) times daily.  I am having Ms. Crookston start on cephALEXin. I am also having her maintain her aspirin, Calcium Carbonate-Vitamin D, triamcinolone, Fish Oil, diphenhydrAMINE, cetirizine, Coenzyme Q10 (CO Q 10 PO), Probiotic Product (ALIGN PO), Hypertonic Nasal Wash (SINUS RINSE NA), valsartan-hydrochlorothiazide, ondansetron, and atorvastatin.  Meds ordered this encounter  Medications  . cephALEXin (KEFLEX) 500 MG capsule    Sig: Take 1 capsule (500 mg total) by mouth 2 (two) times daily.    Dispense:  20 capsule    Refill:  0    Order Specific Question:  Supervising Provider    Answer:  Crecencio Mc [[id number]]     Follow-up: Return if symptoms worsen or fail to improve.

## 2015-10-31 NOTE — Patient Instructions (Addendum)
Robitussin as needed for the cough and benadryl at night time.   Keflex twice daily for 10 days (covers strep and sinusitis). With Probiotics of course!   Follow up if you are not getting improvement.

## 2015-11-06 NOTE — Assessment & Plan Note (Signed)
Keflex given to pt. Encouraged use of her probiotics while taking the abx. Benadryl and sinus rinse okay to continue with nasocort after the rinse. FU prn worsening/failure to improve.

## 2015-11-12 ENCOUNTER — Other Ambulatory Visit (INDEPENDENT_AMBULATORY_CARE_PROVIDER_SITE_OTHER): Payer: Medicare Other

## 2015-11-12 ENCOUNTER — Telehealth: Payer: Self-pay | Admitting: *Deleted

## 2015-11-12 DIAGNOSIS — I10 Essential (primary) hypertension: Secondary | ICD-10-CM | POA: Diagnosis not present

## 2015-11-12 DIAGNOSIS — R945 Abnormal results of liver function studies: Secondary | ICD-10-CM

## 2015-11-12 DIAGNOSIS — R7989 Other specified abnormal findings of blood chemistry: Secondary | ICD-10-CM | POA: Diagnosis not present

## 2015-11-12 DIAGNOSIS — E7800 Pure hypercholesterolemia, unspecified: Secondary | ICD-10-CM | POA: Diagnosis not present

## 2015-11-12 LAB — CBC WITH DIFFERENTIAL/PLATELET
BASOS ABS: 0 10*3/uL (ref 0.0–0.1)
Basophils Relative: 0.4 % (ref 0.0–3.0)
EOS PCT: 3.1 % (ref 0.0–5.0)
Eosinophils Absolute: 0.2 10*3/uL (ref 0.0–0.7)
HCT: 41.7 % (ref 36.0–46.0)
HEMOGLOBIN: 13.6 g/dL (ref 12.0–15.0)
Lymphocytes Relative: 26.2 % (ref 12.0–46.0)
Lymphs Abs: 1.8 10*3/uL (ref 0.7–4.0)
MCHC: 32.5 g/dL (ref 30.0–36.0)
MCV: 92.4 fl (ref 78.0–100.0)
MONOS PCT: 5.4 % (ref 3.0–12.0)
Monocytes Absolute: 0.4 10*3/uL (ref 0.1–1.0)
Neutro Abs: 4.5 10*3/uL (ref 1.4–7.7)
Neutrophils Relative %: 64.9 % (ref 43.0–77.0)
Platelets: 237 10*3/uL (ref 150.0–400.0)
RBC: 4.51 Mil/uL (ref 3.87–5.11)
RDW: 13.9 % (ref 11.5–15.5)
WBC: 6.9 10*3/uL (ref 4.0–10.5)

## 2015-11-12 LAB — LIPID PANEL
CHOLESTEROL: 197 mg/dL (ref 0–200)
HDL: 61.9 mg/dL (ref >39.00)
LDL CALC: 118 mg/dL — AB (ref 0–99)
NonHDL: 135
Total CHOL/HDL Ratio: 3
Triglycerides: 87 mg/dL (ref 0.0–149.0)
VLDL: 17.4 mg/dL (ref 0.0–40.0)

## 2015-11-12 LAB — BASIC METABOLIC PANEL
BUN: 15 mg/dL (ref 6–23)
CALCIUM: 10.5 mg/dL (ref 8.4–10.5)
CHLORIDE: 104 meq/L (ref 96–112)
CO2: 31 meq/L (ref 19–32)
CREATININE: 0.85 mg/dL (ref 0.40–1.20)
GFR: 70.32 mL/min (ref >60.00)
GLUCOSE: 85 mg/dL (ref 70–99)
Potassium: 3.9 mEq/L (ref 3.5–5.1)
SODIUM: 142 meq/L (ref 135–145)

## 2015-11-12 LAB — TSH: TSH: 2.14 u[IU]/mL (ref 0.35–4.50)

## 2015-11-12 LAB — HEPATIC FUNCTION PANEL
ALT: 22 U/L (ref 0–35)
AST: 24 U/L (ref 0–37)
Albumin: 4.3 g/dL (ref 3.5–5.2)
Alkaline Phosphatase: 71 U/L (ref 39–117)
BILIRUBIN TOTAL: 0.4 mg/dL (ref 0.2–1.2)
Bilirubin, Direct: 0.1 mg/dL (ref 0.0–0.3)
Total Protein: 7.3 g/dL (ref 6.0–8.3)

## 2015-11-12 NOTE — Telephone Encounter (Signed)
Order placed for labs.

## 2015-11-12 NOTE — Telephone Encounter (Signed)
Labs and dx?  

## 2015-11-20 ENCOUNTER — Ambulatory Visit (INDEPENDENT_AMBULATORY_CARE_PROVIDER_SITE_OTHER): Payer: Medicare Other | Admitting: Internal Medicine

## 2015-11-20 ENCOUNTER — Encounter: Payer: Self-pay | Admitting: Internal Medicine

## 2015-11-20 VITALS — BP 120/80 | HR 78 | Temp 98.6°F | Resp 18 | Ht 61.0 in | Wt 115.0 lb

## 2015-11-20 DIAGNOSIS — I10 Essential (primary) hypertension: Secondary | ICD-10-CM | POA: Diagnosis not present

## 2015-11-20 DIAGNOSIS — R21 Rash and other nonspecific skin eruption: Secondary | ICD-10-CM

## 2015-11-20 DIAGNOSIS — R945 Abnormal results of liver function studies: Secondary | ICD-10-CM

## 2015-11-20 DIAGNOSIS — Z9189 Other specified personal risk factors, not elsewhere classified: Secondary | ICD-10-CM | POA: Diagnosis not present

## 2015-11-20 DIAGNOSIS — J329 Chronic sinusitis, unspecified: Secondary | ICD-10-CM

## 2015-11-20 DIAGNOSIS — E7800 Pure hypercholesterolemia, unspecified: Secondary | ICD-10-CM

## 2015-11-20 DIAGNOSIS — Z01818 Encounter for other preprocedural examination: Secondary | ICD-10-CM

## 2015-11-20 DIAGNOSIS — R7989 Other specified abnormal findings of blood chemistry: Secondary | ICD-10-CM

## 2015-11-20 DIAGNOSIS — Z87898 Personal history of other specified conditions: Secondary | ICD-10-CM

## 2015-11-20 DIAGNOSIS — R634 Abnormal weight loss: Secondary | ICD-10-CM

## 2015-11-20 MED ORDER — DOXYCYCLINE HYCLATE 100 MG PO TABS: 100.0000 mg | ORAL_TABLET | Freq: Two times a day (BID) | ORAL | Status: DC

## 2015-11-20 NOTE — Progress Notes (Signed)
Patient ID: Jamie Elliott, female   DOB: 05-21-1946, 69 y.o.   MRN: [id number]   Subjective:    Patient ID: Jamie Elliott, female    DOB: [birth year], 69 y.o.   MRN: [id number]  HPI  Patient with past history of hypercholesterolemia, allergies and hypertension.  She comes in today to follow up on these issues and for a pre op evaluation.  She is planning to have right shoulder surgery 12/05/15. She reports increased sinus pressure and increased drainage.  Yellow mucus production.  Cough.  Some increased fatigue.  No chest congestion.  No wheezing.  Has taken benadryl.  Eating and drinking well.  Was in ER on 09/19/15 with GI issues.  Family was sick as well.  This has all resolved.  No nausea or vomiting.  Bowels stable.  Is planning to have her mammogram at [facility].  States is scheduled.  Seeing dermatology.  Using a cream.  Rash is better.  Continues f/u with dermatology.  Some weight loss.  Increased stress. She stays active.  No cardiac symptoms with increased activity or exertion.       Past Medical History  Diagnosis Date  . Hypertension   . Hypercholesterolemia   . Migraines   . Environmental allergies    Past Surgical History  Procedure Laterality Date  . Septoplasty    . Abdominal hysterectomy  2000  . Appendectomy  2000  . Nose surgery  1967   Family History  Problem Relation Age of Onset  . Heart disease Mother   . Hyperlipidemia Mother   . Hypertension Mother   . Cancer Father     lung, prostate  . Hyperlipidemia Father   . Hypertension Father    Social History   Social History  . Marital Status: Married    Spouse Name: N/A  . Number of Children: 2  . Years of Education: N/A   Social History Main Topics  . Smoking status: Never Smoker   . Smokeless tobacco: Never Used  . Alcohol Use: No  . Drug Use: No  . Sexual Activity: Yes   Other Topics Concern  . None   Social History Narrative    Outpatient Encounter Prescriptions as of 11/20/2015  Medication Sig  .  aspirin 325 MG EC tablet Take 325 mg by mouth daily.  Marland Kitchen atorvastatin (LIPITOR) 10 MG tablet Take 1 tablet (10 mg total) by mouth daily.  . Calcium Carbonate-Vitamin D (CALCIUM 600+D) 600-200 MG-UNIT TABS Take by mouth 2 (two) times daily.  . cephALEXin (KEFLEX) 500 MG capsule Take 1 capsule (500 mg total) by mouth 2 (two) times daily.  . cetirizine (ZYRTEC) 10 MG tablet Take 1 tablet (10 mg total) by mouth daily.  . Coenzyme Q10 (CO Q 10 PO) Take by mouth 2 (two) times daily.  . diphenhydrAMINE (BENADRYL) 25 mg capsule Take 25 mg by mouth at bedtime as needed.  . Hypertonic Nasal Wash (SINUS RINSE NA) Place into the nose daily.  . Omega-3 Fatty Acids (FISH OIL) 1000 MG CAPS Take by mouth 2 (two) times daily.  . ondansetron (ZOFRAN ODT) 4 MG disintegrating tablet Take 1 tablet (4 mg total) by mouth every 8 (eight) hours as needed for nausea or vomiting.  . Probiotic Product (ALIGN PO) Take by mouth as needed.  . triamcinolone (NASACORT) 55 MCG/ACT nasal inhaler Place 2 sprays into the nose as needed.  . valsartan-hydrochlorothiazide (DIOVAN-HCT) 160-12.5 MG per tablet Take 1 tablet by mouth daily.  Marland Kitchen doxycycline (VIBRA-TABS) 100  MG tablet Take 1 tablet (100 mg total) by mouth 2 (two) times daily.   No facility-administered encounter medications on file as of 11/20/2015.    Review of Systems  Constitutional:       Appetite is better now.  Eating and drinking ok.  Has lost weight.    HENT: Positive for congestion, postnasal drip and sinus pressure.   Eyes: Negative for pain and discharge.  Respiratory: Positive for cough. Negative for chest tightness and shortness of breath.   Cardiovascular: Negative for chest pain, palpitations and leg swelling.  Gastrointestinal: Negative for nausea, vomiting, abdominal pain and diarrhea.  Genitourinary: Negative for dysuria and frequency.  Musculoskeletal: Negative for myalgias and joint swelling.  Skin: Positive for rash (improving.  seeing  dermatology.). Negative for color change.  Neurological: Negative for dizziness, light-headedness and headaches.  Psychiatric/Behavioral: Negative for dysphoric mood and agitation.       Objective:    Physical Exam  Constitutional: She appears well-developed and well-nourished. No distress.  HENT:  Mouth/Throat: Oropharynx is clear and moist.  Nares - slightly erythematous turbinates.  Minimal tenderness to palpation over the sinuses.    Eyes: Conjunctivae are normal. Right eye exhibits no discharge. Left eye exhibits no discharge.  Neck: Neck supple. No thyromegaly present.  Cardiovascular: Normal rate and regular rhythm.   Pulmonary/Chest: Breath sounds normal. No respiratory distress. She has no wheezes.  Abdominal: Soft. Bowel sounds are normal. There is no tenderness.  Musculoskeletal: She exhibits no edema or tenderness.  Lymphadenopathy:    She has no cervical adenopathy.  Skin: Skin is warm and dry. No erythema.  Psychiatric: She has a normal mood and affect. Her behavior is normal.    BP 120/80 mmHg  Pulse 78  Temp(Src) 98.6 F (37 C) (Oral)  Resp 18  Ht 5\' 1"  (1.549 m)  Wt 115 lb (52.164 kg)  BMI 21.74 kg/m2  SpO2 94%  LMP 11/28/1998 Wt Readings from Last 3 Encounters:  11/20/15 115 lb (52.164 kg)  10/31/15 119 lb (53.978 kg)  09/30/15 118 lb 9.6 oz (53.797 kg)     Lab Results  Component Value Date   WBC 6.9 11/12/2015   HGB 13.6 11/12/2015   HCT 41.7 11/12/2015   PLT 237.0 11/12/2015   GLUCOSE 85 11/12/2015   CHOL 197 11/12/2015   TRIG 87.0 11/12/2015   HDL 61.90 11/12/2015   LDLDIRECT 103.0 05/21/2015   LDLCALC 118* 11/12/2015   ALT 22 11/12/2015   AST 24 11/12/2015   NA 142 11/12/2015   K 3.9 11/12/2015   CL 104 11/12/2015   CREATININE 0.85 11/12/2015   BUN 15 11/12/2015   CO2 31 11/12/2015   TSH 2.14 11/12/2015       Assessment & Plan:   Problem List Items Addressed This Visit    Abnormal liver function tests    Liver panel 11/12/15  - wnl.        History of abnormal mammogram    Mammogram 12/18/14 recommended f/u right mammogram.  F/u right mammogram ok.  Recommended f/u mammogram in one year.  Is scheduled (per pt).        Hypercholesterolemia    Low cholesterol diet and exercise.  On lipitor.  Follow lipid panel and liver function tests.        Hypertension - Primary    Blood pressure under good control.  Continue same medication regimen.  Follow pressures.  Follow metabolic panel.   She will need close intra op and post op  monitoring of her heart rate and blood pressure to avoid extremes.        Loss of weight    Weight loss.  Increase po intake.  Schedule f/u appt to f/u on her weight.  Treat infection.  No nausea or vomiting.  Eating.  No bowel change.        Pre-op evaluation    Just had EKG in ER - SR with no acute ischemic changes.  She denies any chest pain or tightness.  No sob.  Given asymptomatic, I feel she is a low risk from a cardiac standpoint to proceed with the planned surgery.  Will hold on repeat EKG or further cardiac testing.  She will need close intraop and post op monitoring of her heart rate and blood pressure to avoid extremes.  Will need infection clear prior to surgery.        Rash    Seeing dermatology.        Sinusitis    Symptoms and exam as outlined.  Persistent congestion and sinus pressure.  Has upcoming surgery.  Saline nasal spray and nasacort nasal spray as outlined.  Robitussin as directed.  Treat with doxycycline bid as directed.  Will need to clear infection prior to surgery.  Stay hydrated.        Relevant Medications   doxycycline (VIBRA-TABS) 100 MG tablet     I spent 40 minutes with the patient and more than 50% of the time was spent in consultation regarding the above.     Einar Pheasant, MD

## 2015-11-20 NOTE — Progress Notes (Signed)
Pre-visit discussion using our clinic review tool. No additional management support is needed unless otherwise documented below in the visit note.  

## 2015-11-24 ENCOUNTER — Encounter: Payer: Self-pay | Admitting: Internal Medicine

## 2015-11-25 ENCOUNTER — Encounter
Admission: RE | Admit: 2015-11-25 | Discharge: 2015-11-25 | Disposition: A | Discharge status: Home / Self Care | Payer: Medicare Other | Source: Ambulatory Visit | Attending: Orthopedic Surgery | Admitting: Orthopedic Surgery

## 2015-11-25 ENCOUNTER — Encounter: Payer: Self-pay | Admitting: Internal Medicine

## 2015-11-25 ENCOUNTER — Ambulatory Visit
Admission: RE | Admit: 2015-11-25 | Discharge: 2015-11-25 | Disposition: A | Discharge status: Home / Self Care | Payer: Medicare Other | Source: Ambulatory Visit | Attending: Orthopedic Surgery | Admitting: Orthopedic Surgery

## 2015-11-25 DIAGNOSIS — Z01818 Encounter for other preprocedural examination: Secondary | ICD-10-CM | POA: Insufficient documentation

## 2015-11-25 DIAGNOSIS — Z01810 Encounter for preprocedural cardiovascular examination: Secondary | ICD-10-CM | POA: Insufficient documentation

## 2015-11-25 DIAGNOSIS — Z01812 Encounter for preprocedural laboratory examination: Secondary | ICD-10-CM | POA: Insufficient documentation

## 2015-11-25 DIAGNOSIS — R634 Abnormal weight loss: Secondary | ICD-10-CM | POA: Insufficient documentation

## 2015-11-25 DIAGNOSIS — I10 Essential (primary) hypertension: Secondary | ICD-10-CM

## 2015-11-25 LAB — URINALYSIS COMPLETE WITH MICROSCOPIC (ARMC ONLY)
BACTERIA UA: NONE SEEN
Bilirubin Urine: NEGATIVE
Glucose, UA: 50 mg/dL — AB
HGB URINE DIPSTICK: NEGATIVE
Ketones, ur: NEGATIVE mg/dL
LEUKOCYTES UA: NEGATIVE
NITRITE: NEGATIVE
PH: 5 (ref 5.0–8.0)
PROTEIN: NEGATIVE mg/dL
SPECIFIC GRAVITY, URINE: 1.02 (ref 1.005–1.030)

## 2015-11-25 LAB — APTT: aPTT: 28 seconds (ref 24–36)

## 2015-11-25 LAB — PROTIME-INR
INR: 1
Prothrombin Time: 13.4 seconds (ref 11.4–15.0)

## 2015-11-25 NOTE — Pre-Procedure Instructions (Addendum)
Medical clearance on chart. EKG viewed by Dr Einar Pheasant MD.

## 2015-11-25 NOTE — Pre-Procedure Instructions (Signed)
UA showing glucose faxed copy of results to Dr Ronda Fairly and Dr Mack Guise.

## 2015-11-25 NOTE — Assessment & Plan Note (Signed)
Symptoms and exam as outlined.  Persistent congestion and sinus pressure.  Has upcoming surgery.  Saline nasal spray and nasacort nasal spray as outlined.  Robitussin as directed.  Treat with doxycycline bid as directed.  Will need to clear infection prior to surgery.  Stay hydrated.

## 2015-11-25 NOTE — Assessment & Plan Note (Signed)
Weight loss.  Increase po intake.  Schedule f/u appt to f/u on her weight.  Treat infection.  No nausea or vomiting.  Eating.  No bowel change.

## 2015-11-25 NOTE — Assessment & Plan Note (Signed)
Mammogram 12/18/14 recommended f/u right mammogram.  F/u right mammogram ok.  Recommended f/u mammogram in one year.  Is scheduled (per pt).

## 2015-11-25 NOTE — Assessment & Plan Note (Signed)
Liver panel 11/12/15 - wnl.

## 2015-11-25 NOTE — Assessment & Plan Note (Signed)
Low cholesterol diet and exercise.  On lipitor.  Follow lipid panel and liver function tests.   

## 2015-11-25 NOTE — Assessment & Plan Note (Signed)
Just had EKG in ER - SR with no acute ischemic changes.  She denies any chest pain or tightness.  No sob.  Given asymptomatic, I feel she is a low risk from a cardiac standpoint to proceed with the planned surgery.  Will hold on repeat EKG or further cardiac testing.  She will need close intraop and post op monitoring of her heart rate and blood pressure to avoid extremes.  Will need infection clear prior to surgery.

## 2015-11-25 NOTE — Assessment & Plan Note (Signed)
Seeing dermatology.

## 2015-11-25 NOTE — Assessment & Plan Note (Signed)
Blood pressure under good control.  Continue same medication regimen.  Follow pressures.  Follow metabolic panel.   She will need close intra op and post op monitoring of her heart rate and blood pressure to avoid extremes.

## 2015-11-25 NOTE — Patient Instructions (Signed)
  Your procedure is scheduled on: 12/05/15 Thurs Report to Day Surgery. To find out your arrival time please call [phone] between 1PM - 3PM on 12/04/15 Wed.  Remember: Instructions that are not followed completely may result in serious medical risk, up to and including death, or upon the discretion of your surgeon and anesthesiologist your surgery may need to be rescheduled.    __x__ 1. Do not eat food or drink liquids after midnight. No gum chewing or hard candies.     ____ 2. No Alcohol for 24 hours before or after surgery.   ____ 3. Bring all medications with you on the day of surgery if instructed.    _x___ 4. Notify your doctor if there is any change in your medical condition     (cold, fever, infections).     Do not wear jewelry, make-up, hairpins, clips or nail polish.  Do not wear lotions, powders, or perfumes. You may wear deodorant.  Do not shave 48 hours prior to surgery. Men may shave face and neck.  Do not bring valuables to the hospital.    [facility] is not responsible for any belongings or valuables.               Contacts, dentures or bridgework may not be worn into surgery.  Leave your suitcase in the car. After surgery it may be brought to your room.  For patients admitted to the hospital, discharge time is determined by your                treatment team.   Patients discharged the day of surgery will not be allowed to drive home.   Please read over the following fact sheets that you were given:      ____ Take these medicines the morning of surgery with A SIP OF WATER:    1. none  2.   3.   4.  5.  6.  ____ Fleet Enema (as directed)   x____ Use CHG Soap as directed  ____ Use inhalers on the day of surgery  ____ Stop metformin 2 days prior to surgery    ____ Take 1/2 of usual insulin dose the night before surgery and none on the morning of surgery.   _x___ Stop Coumadin/Plavix/aspirin on stop aspirin 1 week before surgery  _x___ Stop  Anti-inflammatories on 1 week before surgery May take Tylenol as needed   __x__ Stop supplements until after surgery.  Omega-3 Fatty Acids (FISH OIL) 1000 MG CAPS stop 1 week before surgery  ____ Bring C-Pap to the hospital.

## 2015-11-28 ENCOUNTER — Telehealth: Payer: Self-pay | Admitting: *Deleted

## 2015-11-28 NOTE — Telephone Encounter (Signed)
Pt still c/o head congestion & felt feverish all weekend (no problems since then). Scheduled for surgery on 12/05/15. Will run out of abx on 12/10. Please advise. Pt states sx's are improving but not cleared.

## 2015-11-28 NOTE — Telephone Encounter (Signed)
She is on abx.  If she is not having any problems now, then I would complete the abx and should be ok if no infectious symptoms to proceed with surgery.  Let me know if any problems.

## 2015-11-28 NOTE — Telephone Encounter (Signed)
Pt wants to get a few more pills to be sure that she will be cleared at her pre=op visit on 12/13. Please advise

## 2015-11-29 MED ORDER — DOXYCYCLINE HYCLATE 100 MG PO TABS: 100.0000 mg | ORAL_TABLET | Freq: Two times a day (BID) | ORAL | Status: DC

## 2015-11-29 NOTE — Telephone Encounter (Signed)
I have sent in four more days of abx.  Needs to make sure taking a probiotic.  Let us know if any problems.

## 2015-11-29 NOTE — Telephone Encounter (Signed)
Pt.notified

## 2015-12-02 ENCOUNTER — Other Ambulatory Visit: Payer: Self-pay

## 2015-12-02 MED ORDER — CETIRIZINE HCL 10 MG PO TABS: 10.0000 mg | ORAL_TABLET | Freq: Every day | ORAL | Status: DC

## 2015-12-04 NOTE — Pre-Procedure Instructions (Signed)
See office note from 11/20/15 for details of clearance. Low cardiac risk. Needs to be clear of uri. By Dr Einar Pheasant

## 2015-12-05 ENCOUNTER — Ambulatory Visit: Payer: Medicare Other | Admitting: Anesthesiology

## 2015-12-05 ENCOUNTER — Encounter
Admission: RE | Disposition: A | Discharge status: Home / Self Care | Payer: Self-pay | Source: Ambulatory Visit | Attending: Orthopedic Surgery

## 2015-12-05 ENCOUNTER — Encounter: Payer: Self-pay | Admitting: *Deleted

## 2015-12-05 ENCOUNTER — Ambulatory Visit
Admission: RE | Admit: 2015-12-05 | Discharge: 2015-12-05 | Disposition: A | Discharge status: Home / Self Care | Payer: Medicare Other | Source: Ambulatory Visit | Attending: Orthopedic Surgery | Admitting: Orthopedic Surgery

## 2015-12-05 DIAGNOSIS — T7840XA Allergy, unspecified, initial encounter: Secondary | ICD-10-CM | POA: Diagnosis not present

## 2015-12-05 DIAGNOSIS — X58XXXA Exposure to other specified factors, initial encounter: Secondary | ICD-10-CM | POA: Diagnosis not present

## 2015-12-05 DIAGNOSIS — Z880 Allergy status to penicillin: Secondary | ICD-10-CM | POA: Insufficient documentation

## 2015-12-05 DIAGNOSIS — Z79899 Other long term (current) drug therapy: Secondary | ICD-10-CM | POA: Diagnosis not present

## 2015-12-05 DIAGNOSIS — Z8249 Family history of ischemic heart disease and other diseases of the circulatory system: Secondary | ICD-10-CM | POA: Insufficient documentation

## 2015-12-05 DIAGNOSIS — M75121 Complete rotator cuff tear or rupture of right shoulder, not specified as traumatic: Secondary | ICD-10-CM | POA: Insufficient documentation

## 2015-12-05 DIAGNOSIS — S43421A Sprain of right rotator cuff capsule, initial encounter: Secondary | ICD-10-CM

## 2015-12-05 DIAGNOSIS — I10 Essential (primary) hypertension: Secondary | ICD-10-CM | POA: Diagnosis not present

## 2015-12-05 DIAGNOSIS — M79601 Pain in right arm: Secondary | ICD-10-CM

## 2015-12-05 DIAGNOSIS — Z7982 Long term (current) use of aspirin: Secondary | ICD-10-CM | POA: Diagnosis not present

## 2015-12-05 DIAGNOSIS — M19011 Primary osteoarthritis, right shoulder: Secondary | ICD-10-CM | POA: Diagnosis not present

## 2015-12-05 DIAGNOSIS — M948X1 Other specified disorders of cartilage, shoulder: Secondary | ICD-10-CM | POA: Insufficient documentation

## 2015-12-05 DIAGNOSIS — Z801 Family history of malignant neoplasm of trachea, bronchus and lung: Secondary | ICD-10-CM | POA: Insufficient documentation

## 2015-12-05 DIAGNOSIS — Z881 Allergy status to other antibiotic agents status: Secondary | ICD-10-CM | POA: Diagnosis not present

## 2015-12-05 DIAGNOSIS — E7800 Pure hypercholesterolemia, unspecified: Secondary | ICD-10-CM | POA: Insufficient documentation

## 2015-12-05 DIAGNOSIS — M25519 Pain in unspecified shoulder: Secondary | ICD-10-CM

## 2015-12-05 DIAGNOSIS — Z8042 Family history of malignant neoplasm of prostate: Secondary | ICD-10-CM | POA: Diagnosis not present

## 2015-12-05 HISTORY — PX: SHOULDER ARTHROSCOPY WITH OPEN ROTATOR CUFF REPAIR: SHX6092

## 2015-12-05 SURGERY — ARTHROSCOPY, SHOULDER WITH REPAIR, ROTATOR CUFF, OPEN
Anesthesia: General | Site: Shoulder | Laterality: Right | Wound class: Clean

## 2015-12-05 MED ORDER — GLYCOPYRROLATE 0.2 MG/ML IJ SOLN
INTRAMUSCULAR | Status: DC | PRN
  Administered 2015-12-05: 0.6 mg via INTRAVENOUS

## 2015-12-05 MED ORDER — EPINEPHRINE HCL 1 MG/ML IJ SOLN
INTRAMUSCULAR | Status: DC | PRN
  Administered 2015-12-05: 1 mg

## 2015-12-05 MED ORDER — LIDOCAINE HCL 1 % IJ SOLN
INTRAMUSCULAR | Status: DC | PRN
  Administered 2015-12-05: 9 mL

## 2015-12-05 MED ORDER — CLINDAMYCIN PHOSPHATE 600 MG/50ML IV SOLN
600.0000 mg | Freq: Once | INTRAVENOUS | Status: AC
  Administered 2015-12-05: 600 mg via INTRAVENOUS

## 2015-12-05 MED ORDER — ONDANSETRON HCL 4 MG PO TABS: 4.0000 mg | ORAL_TABLET | Freq: Three times a day (TID) | ORAL | Status: DC | PRN

## 2015-12-05 MED ORDER — ONDANSETRON HCL 4 MG/2ML IJ SOLN: 4.0000 mg | Freq: Once | INTRAMUSCULAR | Status: DC | PRN

## 2015-12-05 MED ORDER — EPHEDRINE SULFATE 50 MG/ML IJ SOLN
INTRAMUSCULAR | Status: DC | PRN
  Administered 2015-12-05: 10 mg via INTRAVENOUS

## 2015-12-05 MED ORDER — BUPIVACAINE HCL 0.25 % IJ SOLN
INTRAMUSCULAR | Status: DC | PRN
  Administered 2015-12-05: 30 mL

## 2015-12-05 MED ORDER — LIDOCAINE HCL (CARDIAC) 20 MG/ML IV SOLN
INTRAVENOUS | Status: DC | PRN
  Administered 2015-12-05: 30 mg via INTRAVENOUS

## 2015-12-05 MED ORDER — PHENYLEPHRINE HCL 10 MG/ML IJ SOLN
20.0000 mg | INTRAMUSCULAR | Status: DC | PRN
  Administered 2015-12-05: 10 ug/min via INTRAVENOUS
  Administered 2015-12-05: 20 ug/min via INTRAVENOUS

## 2015-12-05 MED ORDER — PROPOFOL 10 MG/ML IV BOLUS
INTRAVENOUS | Status: DC | PRN
  Administered 2015-12-05: 100 mg via INTRAVENOUS

## 2015-12-05 MED ORDER — FAMOTIDINE 20 MG PO TABS
20.0000 mg | ORAL_TABLET | Freq: Once | ORAL | Status: AC
  Administered 2015-12-05: 20 mg via ORAL

## 2015-12-05 MED ORDER — NEOMYCIN-POLYMYXIN B GU 40-200000 IR SOLN
Status: DC | PRN
  Administered 2015-12-05: 2 mL

## 2015-12-05 MED ORDER — OXYCODONE HCL 5 MG PO TABS: 5.0000 mg | ORAL_TABLET | ORAL | Status: DC | PRN

## 2015-12-05 MED ORDER — ARTIFICIAL TEARS OP OINT
TOPICAL_OINTMENT | OPHTHALMIC | Status: DC | PRN
  Administered 2015-12-05: 1 via OPHTHALMIC

## 2015-12-05 MED ORDER — NEOSTIGMINE METHYLSULFATE 10 MG/10ML IV SOLN
INTRAVENOUS | Status: DC | PRN
  Administered 2015-12-05: 3 mg via INTRAVENOUS

## 2015-12-05 MED ORDER — FENTANYL CITRATE (PF) 100 MCG/2ML IJ SOLN
INTRAMUSCULAR | Status: DC | PRN
  Administered 2015-12-05: 25 ug via INTRAVENOUS
  Administered 2015-12-05: 50 ug via INTRAVENOUS

## 2015-12-05 MED ORDER — ONDANSETRON HCL 4 MG/2ML IJ SOLN
INTRAMUSCULAR | Status: DC | PRN
  Administered 2015-12-05: 4 mg via INTRAVENOUS

## 2015-12-05 MED ORDER — FENTANYL CITRATE (PF) 100 MCG/2ML IJ SOLN: 25.0000 ug | INTRAMUSCULAR | Status: DC | PRN

## 2015-12-05 MED ORDER — LACTATED RINGERS IV SOLN
INTRAVENOUS | Status: DC
  Administered 2015-12-05 (×2): via INTRAVENOUS

## 2015-12-05 MED ORDER — MIDAZOLAM HCL 2 MG/2ML IJ SOLN
INTRAMUSCULAR | Status: DC | PRN
  Administered 2015-12-05 (×2): 1 mg via INTRAVENOUS

## 2015-12-05 SURGICAL SUPPLY — 61 items
ADAPTER IRRIG TUBE 2 SPIKE SOL (ADAPTER) ×4 IMPLANT
ANCHOR DBLROW ROT CUFF 2.8 MAG (Anchor) ×2 IMPLANT
BUR RADIUS 4.0X18.5 (BURR) ×2 IMPLANT
BUR RADIUS 5.5 (BURR) ×2 IMPLANT
CANNULA 5.75X7 CRYSTAL CLEAR (CANNULA) ×4 IMPLANT
CANNULA PARTIAL THREAD 2X7 (CANNULA) ×2 IMPLANT
CANNULA TWIST IN 8.25X9CM (CANNULA) IMPLANT
CONNECTOR M SMARTSTITCH (Connector) ×2 IMPLANT
COOLER POLAR GLACIER W/PUMP (MISCELLANEOUS) ×2 IMPLANT
DRAPE IMP U-DRAPE 54X76 (DRAPES) ×4 IMPLANT
DRAPE INCISE IOBAN 66X45 STRL (DRAPES) ×2 IMPLANT
DRAPE U-SHAPE 47X51 STRL (DRAPES) ×2 IMPLANT
DURAPREP 26ML APPLICATOR (WOUND CARE) ×6 IMPLANT
GAUZE PETRO XEROFOAM 1X8 (MISCELLANEOUS) ×2 IMPLANT
GAUZE SPONGE 4X4 12PLY STRL (GAUZE/BANDAGES/DRESSINGS) ×4 IMPLANT
GLOVE BIOGEL PI IND STRL 9 (GLOVE) ×1 IMPLANT
GLOVE BIOGEL PI INDICATOR 9 (GLOVE) ×1
GLOVE SURG 9.0 ORTHO LTXF (GLOVE) ×6 IMPLANT
GOWN STRL REUS TWL 2XL XL LVL4 (GOWN DISPOSABLE) ×2 IMPLANT
GOWN STRL REUS W/ TWL LRG LVL3 (GOWN DISPOSABLE) ×1 IMPLANT
GOWN STRL REUS W/ TWL LRG LVL4 (GOWN DISPOSABLE) ×1 IMPLANT
GOWN STRL REUS W/TWL LRG LVL3 (GOWN DISPOSABLE) ×1
GOWN STRL REUS W/TWL LRG LVL4 (GOWN DISPOSABLE) ×1
IV LACTATED RINGER IRRG 3000ML (IV SOLUTION) ×6
IV LR IRRIG 3000ML ARTHROMATIC (IV SOLUTION) ×6 IMPLANT
KIT RM TURNOVER STRD PROC AR (KITS) ×2 IMPLANT
KIT STABILIZATION SHOULDER (MISCELLANEOUS) ×2 IMPLANT
MANIFOLD NEPTUNE II (INSTRUMENTS) ×2 IMPLANT
MASK FACE SPIDER DISP (MASK) ×2 IMPLANT
MAT BLUE FLOOR 46X72 FLO (MISCELLANEOUS) ×4 IMPLANT
NDL SAFETY 18GX1.5 (NEEDLE) ×2 IMPLANT
NDL SAFETY 22GX1.5 (NEEDLE) ×2 IMPLANT
NS IRRIG 500ML POUR BTL (IV SOLUTION) ×2 IMPLANT
PACK ARTHROSCOPY SHOULDER (MISCELLANEOUS) ×2 IMPLANT
PAD GROUND ADULT SPLIT (MISCELLANEOUS) ×2 IMPLANT
PAD WRAPON POLAR SHDR UNIV (MISCELLANEOUS) ×1 IMPLANT
PASSER SUT CAPTURE FIRST (SUTURE) ×2 IMPLANT
SET TUBE SUCT SHAVER OUTFL 24K (TUBING) ×2 IMPLANT
SET TUBE TIP INTRA-ARTICULAR (MISCELLANEOUS) ×2 IMPLANT
SOL .9 NS 3000ML IRR  AL (IV SOLUTION) ×8
SOL .9 NS 3000ML IRR UROMATIC (IV SOLUTION) ×8 IMPLANT
STRIP CLOSURE SKIN 1/2X4 (GAUZE/BANDAGES/DRESSINGS) ×2 IMPLANT
SUT CO BRAID (SUTURE) ×4 IMPLANT
SUT ETHILON 4-0 (SUTURE) ×1
SUT ETHILON 4-0 FS2 18XMFL BLK (SUTURE) ×1
SUT KNTLS 2.8 MAGNUM (Anchor) ×4 IMPLANT
SUT MNCRL 4-0 (SUTURE) ×2
SUT MNCRL 4-0 27XMFL (SUTURE) ×2
SUT PDS AB 0 CT1 27 (SUTURE) ×2 IMPLANT
SUT VIC AB 0 CT1 36 (SUTURE) ×2 IMPLANT
SUT VIC AB 2-0 CT2 27 (SUTURE) IMPLANT
SUTURE ETHLN 4-0 FS2 18XMF BLK (SUTURE) ×1 IMPLANT
SUTURE MAGNUM WIRE 2X48 BLK (SUTURE) IMPLANT
SUTURE MNCRL 4-0 27XMF (SUTURE) ×2 IMPLANT
SUTURE OPUS MAGNUM SZ 2 WHT (SUTURE) ×6 IMPLANT
SYRINGE 10CC LL (SYRINGE) ×2 IMPLANT
TAPE MICROFOAM 4IN (TAPE) ×2 IMPLANT
TUBING ARTHRO INFLOW-ONLY STRL (TUBING) ×2 IMPLANT
TUBING CONNECTING 10 (TUBING) ×2 IMPLANT
WAND HAND CNTRL MULTIVAC 90 (MISCELLANEOUS) ×2 IMPLANT
WRAPON POLAR PAD SHDR UNIV (MISCELLANEOUS) ×2

## 2015-12-05 NOTE — OR Nursing (Signed)
Pt feeling nauseated - peppermint oil aromatherapy used by Henrine Screws- pt feeling better

## 2015-12-05 NOTE — Transfer of Care (Signed)
Immediate Anesthesia Transfer of Care Note  Patient: Jamie Elliott  Procedure(s) Performed: Procedure(s): SHOULDER ARTHROSCOPY WITH MINI OPEN ROTATOR CUFF REPAIR. DISTAL CLAVICLE EXCISION (Right)  Patient Location: PACU  Anesthesia Type:General  Level of Consciousness: awake and patient cooperative  Airway & Oxygen Therapy: Patient Spontanous Breathing and Patient connected to face mask oxygen  Post-op Assessment: Report given to RN and Post -op Vital signs reviewed and stable  Post vital signs: Reviewed and stable  Last Vitals:  Filed Vitals:   12/05/15 1010 12/05/15 1013  BP: 120/59   Pulse: 94 71  Temp: 36 C 36 C  Resp: 18     Complications: No apparent anesthesia complications

## 2015-12-05 NOTE — H&P (Signed)
PREOPERATIVE H&P  Chief Complaint: FULL THICKNESS ROTATOR CUFF TEAR  HPI: Jamie Elliott is a 69 y.o. female who presents for preoperative history and physical with a diagnosis of FULL THICKNESS ROTATOR CUFF TEAR of her right shoulder. Symptoms are rated as moderate to severe, and have been worsening.  Her pain is exacerbated with overhead and abduction activity. She denies any numbness or tingling. This is significantly impairing activities of daily living.  She has elected for surgical management.   Past Medical History  Diagnosis Date  . Hypertension   . Hypercholesterolemia   . Migraines   . Environmental allergies    Past Surgical History  Procedure Laterality Date  . Septoplasty    . Abdominal hysterectomy  2000  . Appendectomy  2000  . Nose surgery  1967   Social History   Social History  . Marital Status: Married    Spouse Name: N/A  . Number of Children: 2  . Years of Education: N/A   Social History Main Topics  . Smoking status: Never Smoker   . Smokeless tobacco: Never Used  . Alcohol Use: No  . Drug Use: No  . Sexual Activity: Yes   Other Topics Concern  . None   Social History Narrative   Family History  Problem Relation Age of Onset  . Heart disease Mother   . Hyperlipidemia Mother   . Hypertension Mother   . Cancer Father     lung, prostate  . Hyperlipidemia Father   . Hypertension Father    Allergies  Allergen Reactions  . Ciprofloxacin Other (See Comments)    Patient reports "it doesn't work for me". Last culture was sensitive, however.   . Zithromax [Azithromycin]   . Augmentin [Amoxicillin-Pot Clavulanate] Rash   Prior to Admission medications   Medication Sig Start Date End Date Taking? Authorizing Provider  atorvastatin (LIPITOR) 10 MG tablet Take 1 tablet (10 mg total) by mouth daily. 10/16/15  Yes Einar Pheasant, MD  cetirizine (ZYRTEC) 10 MG tablet Take 1 tablet (10 mg total) by mouth daily. 12/02/15  Yes Einar Pheasant, MD   Coenzyme Q10 (CO Q 10 PO) Take by mouth 2 (two) times daily.   Yes Historical Provider, MD  doxycycline (VIBRA-TABS) 100 MG tablet Take 1 tablet (100 mg total) by mouth 2 (two) times daily. 11/29/15  Yes Einar Pheasant, MD  Hypertonic Nasal Wash (SINUS RINSE NA) Place into the nose daily.   Yes Historical Provider, MD  Probiotic Product (ALIGN PO) Take by mouth as needed.   Yes Historical Provider, MD  triamcinolone (NASACORT) 55 MCG/ACT nasal inhaler Place 2 sprays into the nose as needed. 03/22/13  Yes Einar Pheasant, MD  valsartan-hydrochlorothiazide (DIOVAN-HCT) 160-12.5 MG per tablet Take 1 tablet by mouth daily. 06/28/15  Yes Einar Pheasant, MD  aspirin 325 MG EC tablet Take 325 mg by mouth daily.    Historical Provider, MD  Calcium Carbonate-Vitamin D (CALCIUM 600+D) 600-200 MG-UNIT TABS Take by mouth 2 (two) times daily.    Historical Provider, MD  cephALEXin (KEFLEX) 500 MG capsule Take 1 capsule (500 mg total) by mouth 2 (two) times daily. Patient not taking: Reported on 12/05/2015 10/31/15   Rubbie Battiest, NP  diphenhydrAMINE (BENADRYL) 25 mg capsule Take 25 mg by mouth at bedtime as needed.    Historical Provider, MD  Omega-3 Fatty Acids (FISH OIL) 1000 MG CAPS Take by mouth 2 (two) times daily.    Historical Provider, MD     Positive ROS: All  other systems have been reviewed and were otherwise negative with the exception of those mentioned in the HPI and as above.  Physical Exam: General: Alert, no acute distress Cardiovascular: Regular rate and rhythm, no murmurs rubs or gallops.  No pedal edema Respiratory: Clear to auscultation bilaterally, no wheezes rales or rhonchi. No cyanosis, no use of accessory musculature GI: No organomegaly, abdomen is soft and non-tender nondistended with positive bowel sounds. Skin: Skin intact, no lesions within the operative field. Neurologic: Sensation intact distally Psychiatric: Patient is competent for consent with normal mood and  affect Lymphatic: No cervical lymphadenopathy  MUSCULOSKELETAL: Right shoulder: Patient's skin is intact. She has no erythema or ecchymosis or swelling. She has intact sensation light touch throughout the right upper extremity. Patient has pain with abduction to 90 and with overhead motion 260. Patient is no loss of motion. She had pain with a downward directed force on her abducted shoulder demonstrated weakness of the supraspinatus. Subscapularis and external rotator strength is 5 out of 5. His full digital wrist and elbow range of motion.  Assessment: FULL THICKNESS ROTATOR CUFF TEAR, RIGHT SHOULDER  Plan: Plan for Procedure(s): RIGHT SHOULDER ARTHROSCOPY WITH OPEN ROTATOR CUFF REPAIR  I met with the patient my office prior to the date of surgery. I discussed with the patient and her husband the details of the operation and the postoperative recovery.  I explained to her use of the abduction sling, Polar Care and TENS unit postoperatively. We discussed the importance of not actively abducting her lifting her arm up away from her body for a minimum of 8 weeks postop.    I discussed the risks and benefits of surgery with the patient and her husband as well. They understand the risks include but are not limited to infection, bleeding, nerve or blood vessel injury, joint stiffness or loss of motion, persistent pain, weakness or instability, retear of the rotator cuff,  hardware failure and the need for further surgery. Medical risks include but are not limited to DVT and pulmonary embolism, myocardial infarction, stroke, pneumonia, respiratory failure and death. Patient understood these risks and wished to proceed.   Thornton Park, MD   12/05/2015 7:21 AM

## 2015-12-05 NOTE — Op Note (Signed)
12/05/2015  10:04 AM  PATIENT:  Jamie Elliott  69 y.o. female  PRE-OPERATIVE DIAGNOSIS:  RIGHT SHOULDER FULL THICKNESS ROTATOR CUFF TEAR  POST-OPERATIVE DIAGNOSIS:  RIGHT SHOULDER FULL THICKNESS ROTATOR CUFF TEAR, SUBACROMIAL IMPINGEMENT and AC JOINT ARTHROSIS, FRAYING OF THE SUPERIOR LABRUM AND GRADE IV CHONDRAL LESION OF THE HUMERAL HEAD  PROCEDURE:  Procedure(s): RIGHT SHOULDER ARTHROSCOPIC SUBACROMIAL DECOMPRESSION, DISTAL CLAVICLE EXCISION, DEBRIDEMENT OF THE SUPERIOR LABRUM AND CHONDROPLASTY OF THE HUMERAL HEAD WITH MINI OPEN ROTATOR CUFF REPAIR.   SURGEON:  Surgeon(s) and Role:    * Thornton Park, MD - Primary  ANESTHESIA:   local and general   PREOPERATIVE INDICATIONS:  Jamie Elliott is a  69 y.o. female with a diagnosis of [place] who failed conservative measures and elected for surgical management.    The risks benefits and alternatives were discussed with the patient preoperatively including but not limited to the risks of infection, bleeding, nerve injury, persistent pain or weakness, failure of the hardware, re-tear of the rotator cuff and the need for further surgery. Medical risks include DVT and pulmonary embolism, myocardial infarction, stroke, pneumonia, respiratory failure and death. Patient understood these risks and wished to proceed.  OPERATIVE IMPLANTS: ArthroCare Magnum M anchors 1 and ArthroCare Magnum 2 anchors x 2  OPERATIVE FINDINGS: Full-thickness tear of the supraspinatus with 5-10 mm of retraction, subacromial impingement and acromioclavicular joint arthrosis.  Grade IV chondral lesion of the humoral head.  OPERATIVE PROCEDURE: The patient was met in the preoperative area. The operative shoulder was signed with the word yes and my initials according the hospital's correct site of surgery protocol.   Patient was brought to the operating room where she underwent general endotracheal intubation and then placed in a beachchair  position.  A spider arm positioner was used for this case. Examination under anesthesia revealed no limitation of motion or instability with load shift testing. The patient had a negative sulcus sign.  Patient was prepped and draped in a sterile fashion. A timeout was performed to verify the patient's name, date of birth, medical record number, correct site of surgery and correct procedure to be performed there was also used to verify the patient received antibiotics that all appropriate instruments, implants and radiographs studies were available in the room. Once all in attendance were in agreement case began.  Bony landmarks were drawn out with a surgical marker along with proposed arthroscopy incisions. These were pre-injected with 1% lidocaine plain. An 11 blade was used to establish a posterior portal through which the arthroscope was placed in the glenohumeral joint. A full diagnostic examination of the shoulder was performed.  Fraying of superior labrum was debrided. There is no tear or significant tendinosis of the biceps tendon. Patient had a grade IV chondral lesion of the humeral head. A chondroplasty of this lesion was performed to debride unstable margins with a 4.62m resector shaver blade.  The arthroscope was then placed in the subacromial space. Extensive bursitis was encountered and debrided using a 4.0 resector shaver blade and a 90 ArthroCare wand from a lateral portal which was established under direct visualization using an 18-gauge spinal needle. A subacromial decompression was also performed using a 5.5 mm resector shaver blade from the lateral portal. Two  Smart stitches were placed in the lateral border of the rotator cuff tear. All arthroscopic instruments were then removed and the mini-open portion of the procedure began.   A saber-type incision was made along the lateral border of  the acromion. The deltoid muscle was identified and split in line with its fibers which allowed  visualization of the rotator cuff. The Smart stitches previously placed in the lateral border of the rotator cuff were brought out through the deltoid split.  A 5.5 mm resector shaver blade was then used to bur the greater tuberosity removing all torn fibers of the rotator cuff.  A single Magnum M anchor was placed at the articular margin of the humeral head with the greater tuberosity. The suture limbs of the Magnum M anchor were passed medially through the rotator cuff using a first pass suture passer.  The Smart stitches were then anchored to the humeral head using two Magnum 2 anchors. These anchors were tensioned to allow for anatomic reduction of the rotator cuff to the greater tuberosity. Arthroscopic images of the repair were taken with the arthroscope both externally and from inside the glenohumeral joint to confirm adequate repair of the cuff..  All incisions were copiously irrigated. The deltoid fascia was repaired using a 0 Vicryl suture.  The subcutaneous tissue of all incisions were closed with a 2-0 Vicryl. Skin closure for the arthroscopic incisions was performed with 4-0 nylon. The skin edges of the saber incision was approximated with a running 4-0 undyed Monocryl.  A dry sterile dressing was applied.  The patient was placed in an abduction sling, with a Polar Caresleeve, a TENS unit.  All sharp and it instrument counts were correct at the conclusion of the case. I was scrubbed and present for the entire case. I spoke with the patient's husband postoperatively to let him know the case had gone without complication and the patient was stable in recovery room.    Timoteo Gaul, MD

## 2015-12-05 NOTE — OR Nursing (Signed)
Moves fingers well on right hand capillary refill wnl and color pink and fingers arm

## 2015-12-05 NOTE — Anesthesia Procedure Notes (Signed)
Procedure Name: Intubation Date/Time: 12/05/2015 7:46 AM Performed by: Courtney Paris Pre-anesthesia Checklist: Patient identified, Emergency Drugs available, Suction available, Patient being monitored and Timeout performed Patient Re-evaluated:Patient Re-evaluated prior to inductionOxygen Delivery Method: Simple face mask Preoxygenation: Pre-oxygenation with 100% oxygen Intubation Type: Combination inhalational/ intravenous induction Ventilation: Mask ventilation without difficulty Laryngoscope Size: Miller and 3 Grade View: Grade II Tube type: Oral Tube size: 7.0 mm Number of attempts: 1 Airway Equipment and Method: Stylet Placement Confirmation: ETT inserted through vocal cords under direct vision,  positive ETCO2,  CO2 detector and breath sounds checked- equal and bilateral Secured at: 20 cm Tube secured with: Tape Dental Injury: Teeth and Oropharynx as per pre-operative assessment

## 2015-12-05 NOTE — Anesthesia Preprocedure Evaluation (Addendum)
Anesthesia Evaluation  Patient identified by MRN, date of birth, ID band Patient awake    Reviewed: Allergy & Precautions, NPO status , Patient's Chart, lab work & pertinent test results  Airway Mallampati: II  TM Distance: >3 FB Neck ROM: Full    Dental no notable dental hx. (+) Caps   Pulmonary neg pulmonary ROS,    Pulmonary exam normal        Cardiovascular hypertension, Pt. on medications Normal cardiovascular exam     Neuro/Psych  Headaches, negative psych ROS   GI/Hepatic negative GI ROS, Neg liver ROS,   Endo/Other  negative endocrine ROS  Renal/GU negative Renal ROS  negative genitourinary   Musculoskeletal negative musculoskeletal ROS (+)   Abdominal Normal abdominal exam  (+)   Peds negative pediatric ROS (+)  Hematology negative hematology ROS (+)   Anesthesia Other Findings   Reproductive/Obstetrics                            Anesthesia Physical Anesthesia Plan  ASA: II  Anesthesia Plan: General   Post-op Pain Management:    Induction: Intravenous  Airway Management Planned: Oral ETT  Additional Equipment:   Intra-op Plan:   Post-operative Plan: Extubation in OR  Informed Consent: I have reviewed the patients History and Physical, chart, labs and discussed the procedure including the risks, benefits and alternatives for the proposed anesthesia with the patient or authorized representative who has indicated his/her understanding and acceptance.   Dental advisory given  Plan Discussed with: CRNA and Surgeon  Anesthesia Plan Comments:         Anesthesia Quick Evaluation

## 2015-12-05 NOTE — Discharge Instructions (Signed)
Wear sling at all times, including sleep.  You will need to use the sling for a total of 4 weeks following surgery.  Do not try and lift your arm away from your body for any reason.   Keep the dressing dry.  You may remove bandage in 3 days.  Leave the Steri-Strips (white medical tape) in place.  You may place additional Band-Aids over top of the Steri-Strips if you wish.  May shower once dressing is removed in 3 days.  Remove sling carefully only for showers, leaving arm down by your side while in the shower.t.  If the the pain medication causes itching, or is too strong, try taking a single tablet at a time, or combining with Benadryl.  You may be most comfortable sleeping in a recliner.  If you do sleep in near bed, placed pillows behind the shoulder that have the operation to support it.  Follow up with Dr. Mack Guise in one week in the office.    AMBULATORY SURGERY  DISCHARGE INSTRUCTIONS   1) The drugs that you were given will stay in your system until tomorrow so for the next 24 hours you should not:  A) Drive an automobile B) Make any legal decisions C) Drink any alcoholic beverage   2) You may resume regular meals tomorrow.  Today it is better to start with liquids and gradually work up to solid foods.  You may eat anything you prefer, but it is better to start with liquids, then soup and crackers, and gradually work up to solid foods.   3) Please notify your doctor immediately if you have any unusual bleeding, trouble breathing, redness and pain at the surgery site, drainage, fever, or pain not relieved by medication.    4) Additional Instructions:        Please contact your physician with any problems or Same Day Surgery at [phone], Monday through Friday 6 am to 4 pm, or [place] at [facility] number at [phone].

## 2015-12-05 NOTE — OR Nursing (Signed)
Tens unit and polar ice instructions reviewed

## 2015-12-05 NOTE — Progress Notes (Signed)
Polar care to right shoulder.

## 2015-12-06 NOTE — Anesthesia Postprocedure Evaluation (Signed)
Anesthesia Post Note  Patient: Jamie Elliott  Procedure(s) Performed: Procedure(s) (LRB): SHOULDER ARTHROSCOPY WITH MINI OPEN ROTATOR CUFF REPAIR. DISTAL CLAVICLE EXCISION (Right)  Patient location during evaluation: PACU Anesthesia Type: General Level of consciousness: awake and alert and oriented Pain management: pain level controlled Vital Signs Assessment: post-procedure vital signs reviewed and stable Respiratory status: spontaneous breathing Cardiovascular status: blood pressure returned to baseline Anesthetic complications: no    Last Vitals:  Filed Vitals:   12/05/15 1145 12/05/15 1213  BP: 140/63 135/66  Pulse: 64 60  Temp:    Resp: 16 16    Last Pain:  Filed Vitals:   12/06/15 0840  PainSc: 0-No pain                 Ivannah Zody

## 2015-12-31 ENCOUNTER — Ambulatory Visit (INDEPENDENT_AMBULATORY_CARE_PROVIDER_SITE_OTHER): Payer: Medicare Other | Admitting: Nurse Practitioner

## 2015-12-31 VITALS — BP 104/58 | HR 76 | Temp 98.1°F | Resp 14 | Ht 61.0 in | Wt 119.8 lb

## 2015-12-31 DIAGNOSIS — Z87440 Personal history of urinary (tract) infections: Secondary | ICD-10-CM

## 2015-12-31 DIAGNOSIS — R300 Dysuria: Secondary | ICD-10-CM

## 2015-12-31 LAB — POCT URINALYSIS DIP (MANUAL ENTRY)
Bilirubin, UA: NEGATIVE
Glucose, UA: NEGATIVE
Ketones, POC UA: NEGATIVE
NITRITE UA: NEGATIVE
PH UA: 7
PROTEIN UA: NEGATIVE
Spec Grav, UA: 1.015
UROBILINOGEN UA: 0.2

## 2015-12-31 LAB — URINALYSIS, ROUTINE W REFLEX MICROSCOPIC
BILIRUBIN URINE: NEGATIVE
HGB URINE DIPSTICK: NEGATIVE
Ketones, ur: NEGATIVE
NITRITE: NEGATIVE
PH: 7 (ref 5.0–8.0)
Specific Gravity, Urine: <=1.005 — AB (ref 1.000–1.030)
TOTAL PROTEIN, URINE-UPE24: NEGATIVE
URINE GLUCOSE: NEGATIVE
Urobilinogen, UA: 0.2 (ref 0.0–1.0)

## 2015-12-31 MED ORDER — CEPHALEXIN 500 MG PO CAPS: 500.0000 mg | ORAL_CAPSULE | Freq: Two times a day (BID) | ORAL | Status: DC

## 2015-12-31 NOTE — Progress Notes (Signed)
Patient ID: Jamie Elliott, female    DOB: 1946/07/24  Age: 70 y.o. MRN: [id number]  CC: Dysuria and Urinary Frequency   HPI Jamie Elliott presents for CC of dysuria x 3 days.   1) Frequency started Sunday with dysuria  Treatment to date:  Drinking more water  Cranberry juice   Last urine concerns were this summer with full sensitivity   History Jamie Elliott has a past medical history of Hypertension; Hypercholesterolemia; Migraines; and Environmental allergies.   Jamie Elliott has past surgical history that includes Septoplasty; Abdominal hysterectomy (2000); Appendectomy (2000); Nose surgery (1967); and Shoulder arthroscopy with open rotator cuff repair (Right, 12/05/2015).   Her family history includes Cancer in her father; Heart disease in her mother; Hyperlipidemia in her father and mother; Hypertension in her father and mother.Jamie Elliott reports that Jamie Elliott has never smoked. Jamie Elliott has never used smokeless tobacco. Jamie Elliott reports that Jamie Elliott does not drink alcohol or use illicit drugs.  Outpatient Prescriptions Prior to Visit  Medication Sig Dispense Refill  . atorvastatin (LIPITOR) 10 MG tablet Take 1 tablet (10 mg total) by mouth daily. (Patient taking differently: Take 1 tablet (10 mg total) by mouth 3 times weekly) 30 tablet 5  . Calcium Carbonate-Vitamin D (CALCIUM 600+D) 600-200 MG-UNIT TABS Take by mouth 2 (two) times daily.    . cetirizine (ZYRTEC) 10 MG tablet Take 1 tablet (10 mg total) by mouth daily. 30 tablet 11  . Coenzyme Q10 (CO Q 10 PO) Take by mouth 2 (two) times daily.    . Hypertonic Nasal Wash (SINUS RINSE NA) Place into the nose daily.    . Omega-3 Fatty Acids (FISH OIL) 1000 MG CAPS Take by mouth 2 (two) times daily.    . Probiotic Product (ALIGN PO) Take by mouth as needed.    . triamcinolone (NASACORT) 55 MCG/ACT nasal inhaler Place 2 sprays into the nose as needed. 1 Inhaler 5  . valsartan-hydrochlorothiazide (DIOVAN-HCT) 160-12.5 MG per tablet Take 1 tablet by mouth daily. 30 tablet 5  .  diphenhydrAMINE (BENADRYL) 25 mg capsule Take 25 mg by mouth at bedtime as needed. Reported on 12/31/2015    . doxycycline (VIBRA-TABS) 100 MG tablet Take 1 tablet (100 mg total) by mouth 2 (two) times daily. (Patient not taking: Reported on 12/31/2015) 8 tablet 0  . ondansetron (ZOFRAN) 4 MG tablet Take 1 tablet (4 mg total) by mouth every 8 (eight) hours as needed for nausea or vomiting. (Patient not taking: Reported on 12/31/2015) 30 tablet 0  . oxyCODONE (OXY IR/ROXICODONE) 5 MG immediate release tablet Take 1-2 tablets (5-10 mg total) by mouth every 4 (four) hours as needed for severe pain. (Patient not taking: Reported on 12/31/2015) 60 tablet 0   No facility-administered medications prior to visit.    ROS Review of Systems  Constitutional: Negative for fever, chills, diaphoresis and fatigue.  Gastrointestinal: Negative for nausea, vomiting and diarrhea.  Genitourinary: Positive for dysuria, urgency and frequency. Negative for hematuria, flank pain, decreased urine volume, difficulty urinating and pelvic pain.  Skin: Negative for rash.    Objective:  BP 104/58 mmHg  Pulse 76  Temp(Src) 98.1 F (36.7 C) (Oral)  Resp 14  Ht 5\' 1"  (1.549 m)  Wt 119 lb 12.8 oz (54.341 kg)  BMI 22.65 kg/m2  SpO2 97%  LMP 11/28/1998  Physical Exam  Constitutional: Jamie Elliott is oriented to person, place, and time. Jamie Elliott appears well-developed and well-nourished. No distress.  HENT:  Head: Normocephalic and atraumatic.  Right Ear: External ear normal.  Left Ear: External ear normal.  Abdominal: Soft. There is no tenderness. There is no CVA tenderness.  Neurological: Jamie Elliott is alert and oriented to person, place, and time.  Skin: Skin is warm and dry. No rash noted. Jamie Elliott is not diaphoretic.  Psychiatric: Jamie Elliott has a normal mood and affect. Her behavior is normal. Judgment and thought content normal.      Assessment & Plan:   [name] was seen today for dysuria and urinary frequency.  Diagnoses and all orders  for this visit:  Dysuria -     POCT urinalysis dipstick -     Urine culture -     Urinalysis, Routine w reflex microscopic  History of frequent urinary tract infections  Other orders -     cephALEXin (KEFLEX) 500 MG capsule; Take 1 capsule (500 mg total) by mouth 2 (two) times daily.   I have discontinued [name]'s doxycycline, ondansetron, and oxyCODONE. I am also having her start on cephALEXin. Additionally, I am having her maintain her Calcium Carbonate-Vitamin D, triamcinolone, Fish Oil, diphenhydrAMINE, Coenzyme Q10 (CO Q 10 PO), Probiotic Product (ALIGN PO), Hypertonic Nasal Wash (SINUS RINSE NA), valsartan-hydrochlorothiazide, atorvastatin, and cetirizine.  Meds ordered this encounter  Medications  . cephALEXin (KEFLEX) 500 MG capsule    Sig: Take 1 capsule (500 mg total) by mouth 2 (two) times daily.    Dispense:  10 capsule    Refill:  0    Taken in past with success    Order Specific Question:  Supervising Provider    Answer:  Crecencio Mc [[id number]]     Follow-up: Return if symptoms worsen or fail to improve.

## 2015-12-31 NOTE — Patient Instructions (Signed)
Take the keflex as directed  Probiotics!  Unsweetened cranberry juice  Continue to drink water

## 2016-01-02 LAB — URINE CULTURE

## 2016-01-05 ENCOUNTER — Encounter: Payer: Self-pay | Admitting: Nurse Practitioner

## 2016-01-05 NOTE — Assessment & Plan Note (Signed)
Last treated in August with nitrofurantoin POCT urine probable for uti Keflex sent to pharmacy Encouraged probiotics Will obtain urine culture

## 2016-01-06 ENCOUNTER — Encounter: Payer: Self-pay | Admitting: Family Medicine

## 2016-01-06 ENCOUNTER — Ambulatory Visit (INDEPENDENT_AMBULATORY_CARE_PROVIDER_SITE_OTHER): Payer: Medicare Other | Admitting: Family Medicine

## 2016-01-06 DIAGNOSIS — N390 Urinary tract infection, site not specified: Secondary | ICD-10-CM | POA: Insufficient documentation

## 2016-01-06 DIAGNOSIS — N3000 Acute cystitis without hematuria: Secondary | ICD-10-CM

## 2016-01-06 LAB — POCT URINALYSIS DIPSTICK
Bilirubin, UA: NEGATIVE
GLUCOSE UA: NEGATIVE
KETONES UA: NEGATIVE
Leukocytes, UA: NEGATIVE
Nitrite, UA: NEGATIVE
Protein, UA: NEGATIVE
RBC UA: NEGATIVE
SPEC GRAV UA: 1.015
Urobilinogen, UA: 0.2
pH, UA: 7

## 2016-01-06 MED ORDER — NITROFURANTOIN MONOHYD MACRO 100 MG PO CAPS: 100.0000 mg | ORAL_CAPSULE | Freq: Two times a day (BID) | ORAL | Status: DC

## 2016-01-06 NOTE — Progress Notes (Signed)
Pre visit review using our clinic review tool, if applicable. No additional management support is needed unless otherwise documented below in the visit note. 

## 2016-01-06 NOTE — Progress Notes (Signed)
Subjective:  Patient ID: Jamie Elliott, female    DOB: 05-26-1946  Age: 70 y.o. MRN: [id number]  CC: UTI  HPI:  70 year old female with a history of recurrent UTIs presents to clinic today with complaints of UTI symptoms.  Patient was recently seen on 1/10 and was diagnosed and treated for urinary tract infection. Her culture returned back with 100,000 colony forming units of Escherichia coli. It was resistant to the antibiotic that was prescribed (Keflex).  She presents today with continued complaints of mild dysuria at the end of her stream. She reports some frequency as well. No associated fevers or chills. No reports of abdominal pain. No flank pain.  Social Hx   Social History   Social History  . Marital Status: Married    Spouse Name: N/A  . Number of Children: 2  . Years of Education: N/A   Social History Main Topics  . Smoking status: Never Smoker   . Smokeless tobacco: Never Used  . Alcohol Use: No  . Drug Use: No  . Sexual Activity: Yes   Other Topics Concern  . None   Social History Narrative   Review of Systems  Constitutional: Negative for fever.  Gastrointestinal: Negative for abdominal pain.  Genitourinary: Positive for dysuria and frequency. Negative for flank pain.   Objective:  BP 136/72 mmHg  Pulse 72  Temp(Src) 97.5 F (36.4 C) (Oral)  Ht 5\' 1"  (1.549 m)  Wt 115 lb 12 oz (52.504 kg)  BMI 21.88 kg/m2  SpO2 99%  LMP 11/28/1998  BP/Weight 01/06/2016 12/31/2015 [id number]  Systolic BP [id number] [id number]  Diastolic BP 72 58 66  Wt. (Lbs) 115.75 119.8 116  BMI 21.88 22.65 21.57   Physical Exam  Constitutional: She appears well-developed. No distress.  Cardiovascular: Normal rate and regular rhythm.   Pulmonary/Chest: Effort normal and breath sounds normal.  Abdominal: Soft. She exhibits no distension. There is no tenderness. There is no rebound and no guarding.  Neurological: She is alert.  Psychiatric: She has a normal mood and affect.  Vitals  reviewed.  Lab Results  Component Value Date   WBC 6.9 11/12/2015   HGB 13.6 11/12/2015   HCT 41.7 11/12/2015   PLT 237.0 11/12/2015   GLUCOSE 85 11/12/2015   CHOL 197 11/12/2015   TRIG 87.0 11/12/2015   HDL 61.90 11/12/2015   LDLDIRECT 103.0 05/21/2015   LDLCALC 118* 11/12/2015   ALT 22 11/12/2015   AST 24 11/12/2015   NA 142 11/12/2015   K 3.9 11/12/2015   CL 104 11/12/2015   CREATININE 0.85 11/12/2015   BUN 15 11/12/2015   CO2 31 11/12/2015   TSH 2.14 11/12/2015   INR 1.00 11/25/2015    Assessment & Plan:   Problem List Items Addressed This Visit    UTI (urinary tract infection)    Recent problem, no resolution. Recent office visit was reviewed (see brief summary in HPI). Labs and urine culture were also reviewed. Urinalysis was negative today. Sending for culture. Given resistance to Keflex and after discussing watchful waiting versus changing antibody therapy, patient elected to start new antibiotic. Rx for nitrofurantoin sent today.      Relevant Medications   nitrofurantoin, macrocrystal-monohydrate, (MACROBID) 100 MG capsule   Other Relevant Orders   POCT Urinalysis Dipstick (Completed)   Urine culture      Meds ordered this encounter  Medications  . nitrofurantoin, macrocrystal-monohydrate, (MACROBID) 100 MG capsule    Sig: Take 1 capsule (100 mg total) by mouth  2 (two) times daily.    Dispense:  10 capsule    Refill:  0    Follow-up: PRN  [place]

## 2016-01-06 NOTE — Patient Instructions (Signed)
Take the antibiotic as prescribed.  Follow up closely with PCP.  Take care  Dr. Lacinda Axon

## 2016-01-06 NOTE — Assessment & Plan Note (Signed)
Recent problem, no resolution. Recent office visit was reviewed (see brief summary in HPI). Labs and urine culture were also reviewed. Urinalysis was negative today. Sending for culture. Given resistance to Keflex and after discussing watchful waiting versus changing antibody therapy, patient elected to start new antibiotic. Rx for nitrofurantoin sent today.

## 2016-01-08 LAB — URINE CULTURE

## 2016-01-15 ENCOUNTER — Other Ambulatory Visit: Payer: Self-pay | Admitting: Internal Medicine

## 2016-01-23 ENCOUNTER — Encounter: Payer: Self-pay | Admitting: Internal Medicine

## 2016-01-23 ENCOUNTER — Ambulatory Visit (INDEPENDENT_AMBULATORY_CARE_PROVIDER_SITE_OTHER): Payer: Medicare Other | Admitting: Internal Medicine

## 2016-01-23 VITALS — BP 118/70 | HR 76 | Temp 98.0°F | Resp 18 | Ht 61.0 in | Wt 117.2 lb

## 2016-01-23 DIAGNOSIS — R945 Abnormal results of liver function studies: Secondary | ICD-10-CM

## 2016-01-23 DIAGNOSIS — R634 Abnormal weight loss: Secondary | ICD-10-CM

## 2016-01-23 DIAGNOSIS — Z9189 Other specified personal risk factors, not elsewhere classified: Secondary | ICD-10-CM

## 2016-01-23 DIAGNOSIS — E7800 Pure hypercholesterolemia, unspecified: Secondary | ICD-10-CM | POA: Diagnosis not present

## 2016-01-23 DIAGNOSIS — I10 Essential (primary) hypertension: Secondary | ICD-10-CM

## 2016-01-23 DIAGNOSIS — R7989 Other specified abnormal findings of blood chemistry: Secondary | ICD-10-CM

## 2016-01-23 DIAGNOSIS — M79601 Pain in right arm: Secondary | ICD-10-CM

## 2016-01-23 DIAGNOSIS — Z9109 Other allergy status, other than to drugs and biological substances: Secondary | ICD-10-CM

## 2016-01-23 DIAGNOSIS — Z91048 Other nonmedicinal substance allergy status: Secondary | ICD-10-CM

## 2016-01-23 DIAGNOSIS — Z87898 Personal history of other specified conditions: Secondary | ICD-10-CM

## 2016-01-23 NOTE — Progress Notes (Signed)
Pre-visit discussion using our clinic review tool. No additional management support is needed unless otherwise documented below in the visit note.  

## 2016-01-23 NOTE — Progress Notes (Signed)
Patient ID: Jamie Elliott, female   DOB: 1946-07-09, 70 y.o.   MRN: [id number]   Subjective:    Patient ID: Jamie Elliott, female    DOB: 08-01-1946, 70 y.o.   MRN: [id number]  HPI  Patient with past history of hypercholesterolemia, allergies and hypercholesterolemia.  She comes in today to follow up on these issues.  She is s/p surgery.  Doing well.  Doing her exercises.  No chest pain or tightness.  No sob.  No acid reflux.  No abdominal pain or cramping.  Bowels stable.  Sinuses appear to be doing better.     Past Medical History  Diagnosis Date  . Hypertension   . Hypercholesterolemia   . Migraines   . Environmental allergies    Past Surgical History  Procedure Laterality Date  . Septoplasty    . Abdominal hysterectomy  2000  . Appendectomy  2000  . Nose surgery  1967  . Shoulder arthroscopy with open rotator cuff repair Right 12/05/2015    Procedure: SHOULDER ARTHROSCOPY WITH MINI OPEN ROTATOR CUFF REPAIR. DISTAL CLAVICLE EXCISION;  Surgeon: Thornton Park, MD;  Location: [facility] ORS;  Service: Orthopedics;  Laterality: Right;   Family History  Problem Relation Age of Onset  . Heart disease Mother   . Hyperlipidemia Mother   . Hypertension Mother   . Cancer Father     lung, prostate  . Hyperlipidemia Father   . Hypertension Father    Social History   Social History  . Marital Status: Married    Spouse Name: N/A  . Number of Children: 2  . Years of Education: N/A   Social History Main Topics  . Smoking status: Never Smoker   . Smokeless tobacco: Never Used  . Alcohol Use: No  . Drug Use: No  . Sexual Activity: Yes   Other Topics Concern  . None   Social History Narrative    Outpatient Encounter Prescriptions as of 01/23/2016  Medication Sig  . atorvastatin (LIPITOR) 10 MG tablet Take 1 tablet (10 mg total) by mouth daily. (Patient taking differently: Take 1 tablet (10 mg total) by mouth 3 times weekly)  . Calcium Carbonate-Vitamin D (CALCIUM 600+D) 600-200  MG-UNIT TABS Take by mouth 2 (two) times daily.  . cetirizine (ZYRTEC) 10 MG tablet Take 1 tablet (10 mg total) by mouth daily.  . Coenzyme Q10 (CO Q 10 PO) Take by mouth 2 (two) times daily.  . diphenhydrAMINE (BENADRYL) 25 mg capsule Take 25 mg by mouth at bedtime as needed. Reported on 12/31/2015  . Hypertonic Nasal Wash (SINUS RINSE NA) Place into the nose daily.  . Omega-3 Fatty Acids (FISH OIL) 1000 MG CAPS Take by mouth 2 (two) times daily.  . Probiotic Product (ALIGN PO) Take by mouth as needed.  . triamcinolone (NASACORT) 55 MCG/ACT nasal inhaler Place 2 sprays into the nose as needed.  . valsartan-hydrochlorothiazide (DIOVAN-HCT) 160-12.5 MG tablet Take 1 tablet by mouth daily.  . [DISCONTINUED] nitrofurantoin, macrocrystal-monohydrate, (MACROBID) 100 MG capsule Take 1 capsule (100 mg total) by mouth 2 (two) times daily.   No facility-administered encounter medications on file as of 01/23/2016.    Review of Systems  Constitutional: Negative for appetite change and unexpected weight change.  HENT: Negative for congestion and sinus pressure.   Respiratory: Negative for cough, chest tightness and shortness of breath.   Cardiovascular: Negative for chest pain, palpitations and leg swelling.  Gastrointestinal: Negative for nausea, vomiting, abdominal pain and diarrhea.  Genitourinary: Negative for dysuria  and difficulty urinating.  Musculoskeletal: Negative for back pain and joint swelling.       Shoulder is doing better.  Still with some discomfort.  Overall improved.  Doing exercises.    Skin: Negative for color change and rash.  Neurological: Negative for dizziness, light-headedness and headaches.  Psychiatric/Behavioral: Negative for dysphoric mood and agitation.       Objective:     Blood pressure rechecked by me:  128/72  Physical Exam  Constitutional: She appears well-developed and well-nourished. No distress.  HENT:  Nose: Nose normal.  Mouth/Throat: Oropharynx is clear  and moist.  Eyes: Conjunctivae are normal. Right eye exhibits no discharge. Left eye exhibits no discharge.  Neck: Neck supple. No thyromegaly present.  Cardiovascular: Normal rate and regular rhythm.   Pulmonary/Chest: Breath sounds normal. No respiratory distress. She has no wheezes.  Abdominal: Soft. Bowel sounds are normal. There is no tenderness.  Musculoskeletal: She exhibits no edema or tenderness.  Lymphadenopathy:    She has no cervical adenopathy.  Skin: No rash noted. No erythema.  Psychiatric: She has a normal mood and affect. Her behavior is normal.    BP 118/70 mmHg  Pulse 76  Temp(Src) 98 F (36.7 C) (Oral)  Resp 18  Ht 5\' 1"  (1.549 m)  Wt 117 lb 4 oz (53.184 kg)  BMI 22.17 kg/m2  SpO2 97%  LMP 11/28/1998 Wt Readings from Last 3 Encounters:  01/23/16 117 lb 4 oz (53.184 kg)  01/06/16 115 lb 12 oz (52.504 kg)  12/31/15 119 lb 12.8 oz (54.341 kg)     Lab Results  Component Value Date   WBC 6.9 11/12/2015   HGB 13.6 11/12/2015   HCT 41.7 11/12/2015   PLT 237.0 11/12/2015   GLUCOSE 85 11/12/2015   CHOL 197 11/12/2015   TRIG 87.0 11/12/2015   HDL 61.90 11/12/2015   LDLDIRECT 103.0 05/21/2015   LDLCALC 118* 11/12/2015   ALT 22 11/12/2015   AST 24 11/12/2015   NA 142 11/12/2015   K 3.9 11/12/2015   CL 104 11/12/2015   CREATININE 0.85 11/12/2015   BUN 15 11/12/2015   CO2 31 11/12/2015   TSH 2.14 11/12/2015   INR 1.00 11/25/2015    Dg Chest 2 View  11/25/2015  CLINICAL DATA:  Preop rotator cuff surgery EXAM: CHEST  2 VIEW COMPARISON:  08/05/2007 FINDINGS: The heart size and mediastinal contours are within normal limits. Both lungs are clear. The visualized skeletal structures are unremarkable. IMPRESSION: No active cardiopulmonary disease. Electronically Signed   By: Franchot Gallo M.D.   On: 11/25/2015 09:10       Assessment & Plan:   Problem List Items Addressed This Visit    Abnormal liver function tests    Follow liver panel.  Last check wnl.          Environmental allergies    Stable.        History of abnormal mammogram    Is overdue f/u mammogram.  Need to schedule.  Message sent to schedule.        Hypercholesterolemia    On lipitor.  Low cholesterol diet and exercise.  Follow lipid panel and liver function tests.        Relevant Orders   Lipid panel   Hepatic function panel   Hypertension - Primary    Blood pressure under good control.  Continue same medication regimen.  Follow pressures.  Follow metabolic panel.        Relevant Orders   Basic  metabolic panel   Loss of weight    Weight is up a couple of pounds from last check.  Eating well.  Follow.        Right arm pain    S/p surgery.  Continue to f/u with ortho.  Doing better.  Continue exercises.          I spent 25 minutes with the patient and more than 50% of the time was spent in consultation regarding the above.     Einar Pheasant, MD

## 2016-01-24 DIAGNOSIS — M75121 Complete rotator cuff tear or rupture of right shoulder, not specified as traumatic: Secondary | ICD-10-CM | POA: Diagnosis not present

## 2016-01-26 ENCOUNTER — Encounter: Payer: Self-pay | Admitting: Internal Medicine

## 2016-01-26 NOTE — Assessment & Plan Note (Signed)
Follow liver panel.  Last check wnl.  

## 2016-01-26 NOTE — Assessment & Plan Note (Signed)
Weight is up a couple of pounds from last check.  Eating well.  Follow.

## 2016-01-26 NOTE — Assessment & Plan Note (Signed)
S/p surgery.  Continue to f/u with ortho.  Doing better.  Continue exercises.

## 2016-01-26 NOTE — Assessment & Plan Note (Signed)
Is overdue f/u mammogram.  Need to schedule.  Message sent to schedule.

## 2016-01-26 NOTE — Assessment & Plan Note (Signed)
On lipitor.  Low cholesterol diet and exercise.  Follow lipid panel and liver function tests.   

## 2016-01-26 NOTE — Assessment & Plan Note (Signed)
Blood pressure under good control.  Continue same medication regimen.  Follow pressures.  Follow metabolic panel.   

## 2016-01-26 NOTE — Assessment & Plan Note (Signed)
Stable

## 2016-01-27 DIAGNOSIS — M75121 Complete rotator cuff tear or rupture of right shoulder, not specified as traumatic: Secondary | ICD-10-CM | POA: Diagnosis not present

## 2016-01-31 DIAGNOSIS — M75121 Complete rotator cuff tear or rupture of right shoulder, not specified as traumatic: Secondary | ICD-10-CM | POA: Diagnosis not present

## 2016-02-03 ENCOUNTER — Telehealth: Payer: Self-pay | Admitting: Internal Medicine

## 2016-02-03 NOTE — Telephone Encounter (Signed)
Spoke with the patient, she verbalized understanding.  She will update [place] as needed.

## 2016-02-03 NOTE — Telephone Encounter (Signed)
The oxycodone can make her constipated.  I am ok with her using an enema and see if gets results.  If no, then can repeat x 1.  If has recently had a good bowel movement, then can see how bowels do over the next 1-2 days.  After next good bowel movement (or if had good bowel movement) then start miralax daily.  Let us know if persistent problems.

## 2016-02-03 NOTE — Telephone Encounter (Signed)
The patient left a voice mail stating she is having constipation , cramps and lower back pain . She is taking a laxative not getting any relief wants to know if she should take an enema or if there are some other options.

## 2016-02-03 NOTE — Telephone Encounter (Signed)
Spoke with the patient on the phone.  She had forgotten to tell you at her last appointment that since Dec. 15th she has been taking Senna tablets 2 tablets at night to help with constipation.  She had stopped them a week ago as the bottle says not to take for a long period of time.  She is currently still taking oxycodone, which has made her constipated again.  She had horrible abdominal pain on Friday and started the senna again with 2 tablets that night, no bowel movement on Saturday so she took 2 more that evening, still no Bowel movement on Sunday so she took 1 senna in the am and then 2 again that night.  Finally had a Bowel movement this am.  Has had some relief.  She would like to know your thoughts on the senna and what and how long she should take?  She is taking the oxycodone anywhere from 2-3 times a week.  Please advise.

## 2016-02-05 ENCOUNTER — Ambulatory Visit (INDEPENDENT_AMBULATORY_CARE_PROVIDER_SITE_OTHER): Payer: Medicare Other | Admitting: Internal Medicine

## 2016-02-05 ENCOUNTER — Encounter: Payer: Self-pay | Admitting: Internal Medicine

## 2016-02-05 VITALS — BP 142/70 | HR 72 | Temp 97.7°F | Resp 18 | Ht 61.0 in | Wt 117.5 lb

## 2016-02-05 DIAGNOSIS — B029 Zoster without complications: Secondary | ICD-10-CM | POA: Diagnosis not present

## 2016-02-05 DIAGNOSIS — K5900 Constipation, unspecified: Secondary | ICD-10-CM | POA: Diagnosis not present

## 2016-02-05 DIAGNOSIS — I10 Essential (primary) hypertension: Secondary | ICD-10-CM | POA: Diagnosis not present

## 2016-02-05 DIAGNOSIS — M75121 Complete rotator cuff tear or rupture of right shoulder, not specified as traumatic: Secondary | ICD-10-CM | POA: Diagnosis not present

## 2016-02-05 MED ORDER — VALACYCLOVIR HCL 1 G PO TABS: 1000.0000 mg | ORAL_TABLET | Freq: Three times a day (TID) | ORAL | Status: DC

## 2016-02-05 NOTE — Patient Instructions (Signed)
Take 2 extra strength tylenol twice a day as needed.    Take valtrex (for shingles) three times per day for one week.

## 2016-02-05 NOTE — Progress Notes (Signed)
Patient ID: Jamie Elliott, female   DOB: 1945-12-23, 70 y.o.   MRN: [id number]   Subjective:    Patient ID: Jamie Elliott, female    DOB: 04-Nov-1946, 70 y.o.   MRN: [id number]  HPI  Patient with past history of hypertension, allergies and hypercholesterolemia.  She comes in today as a work in with concerns regarding a painful rash.  States rash started yesterday am.  Noticed some back pain.  Rash then present yesterday.  In today for evaluation.  She thought her back pain was originally from being constipated.  She reports increased constipation.  Is not taking miralax.  Did take something to make her bowels move over the weekend.  No other abdominal pain or cramping.  Is eating.  No nausea or vomiting.  No fever.     Past Medical History  Diagnosis Date  . Hypertension   . Hypercholesterolemia   . Migraines   . Environmental allergies    Past Surgical History  Procedure Laterality Date  . Septoplasty    . Abdominal hysterectomy  2000  . Appendectomy  2000  . Nose surgery  1967  . Shoulder arthroscopy with open rotator cuff repair Right 12/05/2015    Procedure: SHOULDER ARTHROSCOPY WITH MINI OPEN ROTATOR CUFF REPAIR. DISTAL CLAVICLE EXCISION;  Surgeon: Thornton Park, MD;  Location: [facility] ORS;  Service: Orthopedics;  Laterality: Right;   Family History  Problem Relation Age of Onset  . Heart disease Mother   . Hyperlipidemia Mother   . Hypertension Mother   . Cancer Father     lung, prostate  . Hyperlipidemia Father   . Hypertension Father    Social History   Social History  . Marital Status: Married    Spouse Name: N/A  . Number of Children: 2  . Years of Education: N/A   Social History Main Topics  . Smoking status: Never Smoker   . Smokeless tobacco: Never Used  . Alcohol Use: No  . Drug Use: No  . Sexual Activity: Yes   Other Topics Concern  . None   Social History Narrative    Outpatient Encounter Prescriptions as of 02/05/2016  Medication Sig  .  atorvastatin (LIPITOR) 10 MG tablet Take 1 tablet (10 mg total) by mouth daily. (Patient taking differently: Take 1 tablet (10 mg total) by mouth 3 times weekly)  . Calcium Carbonate-Vitamin D (CALCIUM 600+D) 600-200 MG-UNIT TABS Take by mouth 2 (two) times daily.  . cetirizine (ZYRTEC) 10 MG tablet Take 1 tablet (10 mg total) by mouth daily.  . Coenzyme Q10 (CO Q 10 PO) Take by mouth 2 (two) times daily.  . diphenhydrAMINE (BENADRYL) 25 mg capsule Take 25 mg by mouth at bedtime as needed. Reported on 12/31/2015  . Hypertonic Nasal Wash (SINUS RINSE NA) Place into the nose daily.  . Omega-3 Fatty Acids (FISH OIL) 1000 MG CAPS Take by mouth 2 (two) times daily.  . Probiotic Product (ALIGN PO) Take by mouth as needed.  . triamcinolone (NASACORT) 55 MCG/ACT nasal inhaler Place 2 sprays into the nose as needed.  . valsartan-hydrochlorothiazide (DIOVAN-HCT) 160-12.5 MG tablet Take 1 tablet by mouth daily.  . valACYclovir (VALTREX) 1000 MG tablet Take 1 tablet (1,000 mg total) by mouth 3 (three) times daily.   No facility-administered encounter medications on file as of 02/05/2016.    Review of Systems  Constitutional: Negative for appetite change and unexpected weight change.  HENT: Negative for congestion and sinus pressure.   Respiratory: Negative for  cough, chest tightness and shortness of breath.   Cardiovascular: Negative for chest pain, palpitations and leg swelling.  Gastrointestinal: Positive for constipation. Negative for nausea, vomiting and abdominal pain.  Genitourinary: Negative for dysuria and difficulty urinating.  Musculoskeletal: Positive for back pain. Negative for joint swelling.  Skin: Positive for rash.       Erythematous based rash.  Some vesicles present.   Neurological: Negative for dizziness, light-headedness and headaches.  Psychiatric/Behavioral: Negative for dysphoric mood and agitation.       Objective:    Physical Exam  Constitutional: She appears  well-developed and well-nourished. No distress.  HENT:  Nose: Nose normal.  Mouth/Throat: Oropharynx is clear and moist.  Eyes: Conjunctivae are normal. Right eye exhibits no discharge. Left eye exhibits no discharge.  Neck: Neck supple. No thyromegaly present.  Cardiovascular: Normal rate and regular rhythm.   Pulmonary/Chest: Breath sounds normal. No respiratory distress. She has no wheezes.  Abdominal: Soft. Bowel sounds are normal. There is no tenderness.  Musculoskeletal: She exhibits no edema or tenderness.  Lymphadenopathy:    She has no cervical adenopathy.  Skin: Rash noted. There is erythema.  erythematous based rash with vesicles present  Psychiatric: She has a normal mood and affect. Her behavior is normal.    BP 142/70 mmHg  Pulse 72  Temp(Src) 97.7 F (36.5 C) (Oral)  Resp 18  Ht 5\' 1"  (1.549 m)  Wt 117 lb 8 oz (53.298 kg)  BMI 22.21 kg/m2  SpO2 95%  LMP 11/28/1998 Wt Readings from Last 3 Encounters:  02/05/16 117 lb 8 oz (53.298 kg)  01/23/16 117 lb 4 oz (53.184 kg)  01/06/16 115 lb 12 oz (52.504 kg)     Lab Results  Component Value Date   WBC 6.9 11/12/2015   HGB 13.6 11/12/2015   HCT 41.7 11/12/2015   PLT 237.0 11/12/2015   GLUCOSE 85 11/12/2015   CHOL 197 11/12/2015   TRIG 87.0 11/12/2015   HDL 61.90 11/12/2015   LDLDIRECT 103.0 05/21/2015   LDLCALC 118* 11/12/2015   ALT 22 11/12/2015   AST 24 11/12/2015   NA 142 11/12/2015   K 3.9 11/12/2015   CL 104 11/12/2015   CREATININE 0.85 11/12/2015   BUN 15 11/12/2015   CO2 31 11/12/2015   TSH 2.14 11/12/2015   INR 1.00 11/25/2015        Assessment & Plan:   Problem List Items Addressed This Visit    Constipation    Constipation as outlined.  Can use enema if goes for a long period without bowel movement.  Responded well previously to enema.  miralax as directed.  Keep hydrated.  Follow.       Herpes zoster - Primary    Exam/rash c/w herpes zoster.  Treat with valtrex as directed.  Tylenol  and gentle use ibuprofen as directed.  Follow.        Relevant Medications   valACYclovir (VALTREX) 1000 MG tablet   Hypertension    Blood pressure has been under good control.  Treat zoster.  Follow pressures.  Hold on making any changes in medication.          I spent 25 minutes with the patient and more than 50% of the time was spent in consultation regarding the above.     Einar Pheasant, MD

## 2016-02-05 NOTE — Progress Notes (Signed)
Pre-visit discussion using our clinic review tool. No additional management support is needed unless otherwise documented below in the visit note.  

## 2016-02-10 DIAGNOSIS — M75121 Complete rotator cuff tear or rupture of right shoulder, not specified as traumatic: Secondary | ICD-10-CM | POA: Diagnosis not present

## 2016-02-12 DIAGNOSIS — M75121 Complete rotator cuff tear or rupture of right shoulder, not specified as traumatic: Secondary | ICD-10-CM | POA: Diagnosis not present

## 2016-02-16 ENCOUNTER — Encounter: Payer: Self-pay | Admitting: Internal Medicine

## 2016-02-16 DIAGNOSIS — K5900 Constipation, unspecified: Secondary | ICD-10-CM | POA: Insufficient documentation

## 2016-02-16 DIAGNOSIS — B029 Zoster without complications: Secondary | ICD-10-CM | POA: Insufficient documentation

## 2016-02-16 NOTE — Assessment & Plan Note (Signed)
Exam/rash c/w herpes zoster.  Treat with valtrex as directed.  Tylenol and gentle use ibuprofen as directed.  Follow.

## 2016-02-16 NOTE — Assessment & Plan Note (Signed)
Blood pressure has been under good control.  Treat zoster.  Follow pressures.  Hold on making any changes in medication.

## 2016-02-16 NOTE — Assessment & Plan Note (Signed)
Constipation as outlined.  Can use enema if goes for a long period without bowel movement.  Responded well previously to enema.  miralax as directed.  Keep hydrated.  Follow.

## 2016-02-17 DIAGNOSIS — M75121 Complete rotator cuff tear or rupture of right shoulder, not specified as traumatic: Secondary | ICD-10-CM | POA: Diagnosis not present

## 2016-02-19 DIAGNOSIS — M75121 Complete rotator cuff tear or rupture of right shoulder, not specified as traumatic: Secondary | ICD-10-CM | POA: Diagnosis not present

## 2016-02-20 ENCOUNTER — Telehealth: Payer: Self-pay

## 2016-02-20 NOTE — Telephone Encounter (Signed)
Notified pt of Dr. Bary Leriche comments, pt verbalized understanding

## 2016-02-20 NOTE — Telephone Encounter (Signed)
Pt states that she has the shingles on her left side, she completed the course medication last Wednesday. Pt is c/o numbness and pain on her left side, is this normal? Please advise, thanks

## 2016-02-20 NOTE — Telephone Encounter (Signed)
Shingles affects the nerve - so numbness and pain are normal symptoms.  If she is concerned, can schedule f/u appt with me to reassess.

## 2016-02-25 DIAGNOSIS — M75121 Complete rotator cuff tear or rupture of right shoulder, not specified as traumatic: Secondary | ICD-10-CM | POA: Diagnosis not present

## 2016-02-27 DIAGNOSIS — M75121 Complete rotator cuff tear or rupture of right shoulder, not specified as traumatic: Secondary | ICD-10-CM | POA: Diagnosis not present

## 2016-03-02 DIAGNOSIS — M75121 Complete rotator cuff tear or rupture of right shoulder, not specified as traumatic: Secondary | ICD-10-CM | POA: Diagnosis not present

## 2016-03-04 DIAGNOSIS — M75121 Complete rotator cuff tear or rupture of right shoulder, not specified as traumatic: Secondary | ICD-10-CM | POA: Diagnosis not present

## 2016-03-11 DIAGNOSIS — M75121 Complete rotator cuff tear or rupture of right shoulder, not specified as traumatic: Secondary | ICD-10-CM | POA: Diagnosis not present

## 2016-03-13 DIAGNOSIS — M75121 Complete rotator cuff tear or rupture of right shoulder, not specified as traumatic: Secondary | ICD-10-CM | POA: Diagnosis not present

## 2016-03-16 DIAGNOSIS — M75121 Complete rotator cuff tear or rupture of right shoulder, not specified as traumatic: Secondary | ICD-10-CM | POA: Diagnosis not present

## 2016-03-18 DIAGNOSIS — M75121 Complete rotator cuff tear or rupture of right shoulder, not specified as traumatic: Secondary | ICD-10-CM | POA: Diagnosis not present

## 2016-03-23 DIAGNOSIS — M75121 Complete rotator cuff tear or rupture of right shoulder, not specified as traumatic: Secondary | ICD-10-CM | POA: Diagnosis not present

## 2016-03-25 DIAGNOSIS — M75121 Complete rotator cuff tear or rupture of right shoulder, not specified as traumatic: Secondary | ICD-10-CM | POA: Diagnosis not present

## 2016-03-30 DIAGNOSIS — M75121 Complete rotator cuff tear or rupture of right shoulder, not specified as traumatic: Secondary | ICD-10-CM | POA: Diagnosis not present

## 2016-04-02 DIAGNOSIS — M75121 Complete rotator cuff tear or rupture of right shoulder, not specified as traumatic: Secondary | ICD-10-CM | POA: Diagnosis not present

## 2016-04-06 DIAGNOSIS — M75121 Complete rotator cuff tear or rupture of right shoulder, not specified as traumatic: Secondary | ICD-10-CM | POA: Diagnosis not present

## 2016-04-07 ENCOUNTER — Telehealth: Payer: Self-pay | Admitting: Internal Medicine

## 2016-04-07 NOTE — Telephone Encounter (Signed)
Patient Name: Jamie Elliott DOB: 1946/03/20 Initial Comment Caller states her BP has been going up- has BP meds but only takes it 3 times a week. 147/76-- Caller states she felt nauseous last nigth as well with vomiting- no current vomit. Nurse Assessment Nurse: Ronnald Ramp, RN, Miranda Date/Time (Eastern Time): 04/07/2016 8:29:02 AM Confirm and document reason for call. If symptomatic, describe symptoms. You must click the next button to save text entered. ---Caller states she was vomiting last night, last episode at midnight. Denies diarrhea. She also has nasal congestion, face pain, and headache. Since last week, her BP has been higher than normal. Today BP is 147/76. Has the patient traveled out of the country within the last 30 days? ---No Does the patient have any new or worsening symptoms? ---Yes Will a triage be completed? ---Yes Related visit to physician within the last 2 weeks? ---No Does the PT have any chronic conditions? (i.e. diabetes, asthma, etc.) ---Yes List chronic conditions. ---HTN, Is this a behavioral health or substance abuse call? ---No Guidelines Guideline Title Affirmed Question Affirmed Notes Sinus Pain or Congestion [1] Using nasal washes and pain medicine > 24 hours AND [2] sinus pain (around cheekbone or eye) persists Vomiting MILD or MODERATE vomiting (e.g., 1 - 5 times / day) (all triage questions negative) Final Disposition User See PCP When Office is Open (within 3 days) Ronnald Ramp, RN, Miranda Comments She had rotator cuff surgery in Dec and having physical therapy. She is taking Meloxicam and not sure if that could be making her BP higher. No appt available with PCP. Appt scheduled for tomorrow wit Dr. Lacinda Axon at 4:15pm Disagree/Comply: Comply

## 2016-04-08 ENCOUNTER — Ambulatory Visit: Payer: Self-pay | Admitting: Family Medicine

## 2016-04-10 DIAGNOSIS — M75121 Complete rotator cuff tear or rupture of right shoulder, not specified as traumatic: Secondary | ICD-10-CM | POA: Diagnosis not present

## 2016-04-20 DIAGNOSIS — M75121 Complete rotator cuff tear or rupture of right shoulder, not specified as traumatic: Secondary | ICD-10-CM | POA: Diagnosis not present

## 2016-04-23 DIAGNOSIS — M75121 Complete rotator cuff tear or rupture of right shoulder, not specified as traumatic: Secondary | ICD-10-CM | POA: Diagnosis not present

## 2016-04-27 DIAGNOSIS — M75121 Complete rotator cuff tear or rupture of right shoulder, not specified as traumatic: Secondary | ICD-10-CM | POA: Diagnosis not present

## 2016-04-30 DIAGNOSIS — M75121 Complete rotator cuff tear or rupture of right shoulder, not specified as traumatic: Secondary | ICD-10-CM | POA: Diagnosis not present

## 2016-05-01 DIAGNOSIS — H2513 Age-related nuclear cataract, bilateral: Secondary | ICD-10-CM | POA: Diagnosis not present

## 2016-05-04 DIAGNOSIS — Z1231 Encounter for screening mammogram for malignant neoplasm of breast: Secondary | ICD-10-CM | POA: Diagnosis not present

## 2016-05-04 LAB — HM MAMMOGRAPHY

## 2016-05-05 ENCOUNTER — Encounter: Payer: Self-pay | Admitting: Internal Medicine

## 2016-05-20 DIAGNOSIS — D2261 Melanocytic nevi of right upper limb, including shoulder: Secondary | ICD-10-CM | POA: Diagnosis not present

## 2016-05-20 DIAGNOSIS — L821 Other seborrheic keratosis: Secondary | ICD-10-CM | POA: Diagnosis not present

## 2016-05-20 DIAGNOSIS — D2271 Melanocytic nevi of right lower limb, including hip: Secondary | ICD-10-CM | POA: Diagnosis not present

## 2016-05-20 DIAGNOSIS — D225 Melanocytic nevi of trunk: Secondary | ICD-10-CM | POA: Diagnosis not present

## 2016-05-22 ENCOUNTER — Other Ambulatory Visit (INDEPENDENT_AMBULATORY_CARE_PROVIDER_SITE_OTHER): Payer: Medicare Other

## 2016-05-22 DIAGNOSIS — E7800 Pure hypercholesterolemia, unspecified: Secondary | ICD-10-CM | POA: Diagnosis not present

## 2016-05-22 DIAGNOSIS — I10 Essential (primary) hypertension: Secondary | ICD-10-CM

## 2016-05-22 LAB — LIPID PANEL
CHOLESTEROL: 219 mg/dL — AB (ref 0–200)
HDL: 60.4 mg/dL (ref >39.00)
LDL Cholesterol: 128 mg/dL — ABNORMAL HIGH (ref 0–99)
NonHDL: 158.57
TRIGLYCERIDES: 153 mg/dL — AB (ref 0.0–149.0)
Total CHOL/HDL Ratio: 4
VLDL: 30.6 mg/dL (ref 0.0–40.0)

## 2016-05-22 LAB — BASIC METABOLIC PANEL
BUN: 19 mg/dL (ref 6–23)
CHLORIDE: 105 meq/L (ref 96–112)
CO2: 30 mEq/L (ref 19–32)
Calcium: 10.1 mg/dL (ref 8.4–10.5)
Creatinine, Ser: 0.87 mg/dL (ref 0.40–1.20)
GFR: 68.35 mL/min (ref >60.00)
GLUCOSE: 84 mg/dL (ref 70–99)
POTASSIUM: 4 meq/L (ref 3.5–5.1)
SODIUM: 143 meq/L (ref 135–145)

## 2016-05-22 LAB — HEPATIC FUNCTION PANEL
ALT: 37 U/L — ABNORMAL HIGH (ref 0–35)
AST: 32 U/L (ref 0–37)
Albumin: 4.2 g/dL (ref 3.5–5.2)
Alkaline Phosphatase: 89 U/L (ref 39–117)
BILIRUBIN DIRECT: 0.1 mg/dL (ref 0.0–0.3)
BILIRUBIN TOTAL: 0.5 mg/dL (ref 0.2–1.2)
Total Protein: 6.6 g/dL (ref 6.0–8.3)

## 2016-05-29 ENCOUNTER — Encounter: Payer: Self-pay | Admitting: Internal Medicine

## 2016-05-29 ENCOUNTER — Ambulatory Visit (INDEPENDENT_AMBULATORY_CARE_PROVIDER_SITE_OTHER): Payer: Medicare Other | Admitting: Internal Medicine

## 2016-05-29 VITALS — BP 110/80 | HR 71 | Temp 98.1°F | Resp 18 | Ht 61.0 in | Wt 117.2 lb

## 2016-05-29 DIAGNOSIS — R945 Abnormal results of liver function studies: Secondary | ICD-10-CM

## 2016-05-29 DIAGNOSIS — R7989 Other specified abnormal findings of blood chemistry: Secondary | ICD-10-CM | POA: Diagnosis not present

## 2016-05-29 DIAGNOSIS — Z0000 Encounter for general adult medical examination without abnormal findings: Secondary | ICD-10-CM

## 2016-05-29 DIAGNOSIS — Z91048 Other nonmedicinal substance allergy status: Secondary | ICD-10-CM

## 2016-05-29 DIAGNOSIS — M25511 Pain in right shoulder: Secondary | ICD-10-CM

## 2016-05-29 DIAGNOSIS — I10 Essential (primary) hypertension: Secondary | ICD-10-CM

## 2016-05-29 DIAGNOSIS — Z9109 Other allergy status, other than to drugs and biological substances: Secondary | ICD-10-CM

## 2016-05-29 DIAGNOSIS — E7800 Pure hypercholesterolemia, unspecified: Secondary | ICD-10-CM | POA: Diagnosis not present

## 2016-05-29 DIAGNOSIS — M810 Age-related osteoporosis without current pathological fracture: Secondary | ICD-10-CM

## 2016-05-29 LAB — HEPATIC FUNCTION PANEL
ALBUMIN: 4.3 g/dL (ref 3.5–5.2)
ALK PHOS: 76 U/L (ref 39–117)
ALT: 18 U/L (ref 0–35)
AST: 18 U/L (ref 0–37)
BILIRUBIN DIRECT: 0.1 mg/dL (ref 0.0–0.3)
TOTAL PROTEIN: 6.8 g/dL (ref 6.0–8.3)
Total Bilirubin: 0.4 mg/dL (ref 0.2–1.2)

## 2016-05-29 NOTE — Progress Notes (Signed)
Patient ID: Jamie Elliott, female   DOB: [birth year], 70 y.o.   MRN: [id number]   Subjective:    Patient ID: Jamie Elliott, female    DOB: Nov 25, 1946, 70 y.o.   MRN: [id number]  HPI  Patient here for a physical exam.  She recently had right shoulder arthroscopy with mini-open rotator cuff repair (12/05/15).  Doing well.  Good rom.  No significant pain.  Was on meloxicam.  Blood pressure elevated on the medication.  When stopped, blood pressure returned to normal.  Tries to stay active.  No cardiac symptoms with increased activity or exertion.  No sob.  No acid reflux.  No abdominal pain or cramping.  Bowels stable.  Overall she feels she is doing well.    Past Medical History  Diagnosis Date  . Hypertension   . Hypercholesterolemia   . Migraines   . Environmental allergies    Past Surgical History  Procedure Laterality Date  . Septoplasty    . Abdominal hysterectomy  2000  . Appendectomy  2000  . Nose surgery  1967  . Shoulder arthroscopy with open rotator cuff repair Right 12/05/2015    Procedure: SHOULDER ARTHROSCOPY WITH MINI OPEN ROTATOR CUFF REPAIR. DISTAL CLAVICLE EXCISION;  Surgeon: Thornton Park, MD;  Location: [facility] ORS;  Service: Orthopedics;  Laterality: Right;   Family History  Problem Relation Age of Onset  . Heart disease Mother   . Hyperlipidemia Mother   . Hypertension Mother   . Cancer Father     lung, prostate  . Hyperlipidemia Father   . Hypertension Father    Social History   Social History  . Marital Status: Married    Spouse Name: N/A  . Number of Children: 2  . Years of Education: N/A   Social History Main Topics  . Smoking status: Never Smoker   . Smokeless tobacco: Never Used  . Alcohol Use: No  . Drug Use: No  . Sexual Activity: Yes   Other Topics Concern  . None   Social History Narrative    Outpatient Encounter Prescriptions as of 05/29/2016  Medication Sig  . atorvastatin (LIPITOR) 10 MG tablet Take 1 tablet (10 mg total) by mouth  daily. (Patient taking differently: Take 1 tablet (10 mg total) by mouth 3 times weekly)  . Calcium Carbonate-Vitamin D (CALCIUM 600+D) 600-200 MG-UNIT TABS Take by mouth 2 (two) times daily.  . cetirizine (ZYRTEC) 10 MG tablet Take 1 tablet (10 mg total) by mouth daily.  . Coenzyme Q10 (CO Q 10 PO) Take by mouth 2 (two) times daily.  . diphenhydrAMINE (BENADRYL) 25 mg capsule Take 25 mg by mouth at bedtime as needed. Reported on 12/31/2015  . Hypertonic Nasal Wash (SINUS RINSE NA) Place into the nose daily.  . Omega-3 Fatty Acids (FISH OIL) 1000 MG CAPS Take by mouth 2 (two) times daily.  . Probiotic Product (ALIGN PO) Take by mouth as needed.  . triamcinolone (NASACORT) 55 MCG/ACT nasal inhaler Place 2 sprays into the nose as needed.  . valsartan-hydrochlorothiazide (DIOVAN-HCT) 160-12.5 MG tablet Take 1 tablet by mouth daily.  . [DISCONTINUED] valACYclovir (VALTREX) 1000 MG tablet Take 1 tablet (1,000 mg total) by mouth 3 (three) times daily.   No facility-administered encounter medications on file as of 05/29/2016.    Review of Systems  Constitutional: Negative for appetite change and unexpected weight change.  HENT: Negative for congestion and sinus pressure.   Eyes: Negative for pain and visual disturbance.  Respiratory: Negative for cough, chest  tightness and shortness of breath.   Cardiovascular: Negative for chest pain, palpitations and leg swelling.  Gastrointestinal: Negative for nausea, vomiting, abdominal pain and diarrhea.  Genitourinary: Negative for dysuria and difficulty urinating.  Musculoskeletal: Negative for back pain and joint swelling.  Skin: Negative for color change and rash.  Neurological: Negative for dizziness and headaches.  Hematological: Negative for adenopathy. Does not bruise/bleed easily.  Psychiatric/Behavioral: Negative for dysphoric mood and agitation.       Objective:     Blood pressure rechecked by me:  128/72  Physical Exam  Constitutional: She  is oriented to person, place, and time. She appears well-developed and well-nourished. No distress.  HENT:  Nose: Nose normal.  Mouth/Throat: Oropharynx is clear and moist.  Eyes: Right eye exhibits no discharge. Left eye exhibits no discharge. No scleral icterus.  Neck: Neck supple. No thyromegaly present.  Cardiovascular: Normal rate and regular rhythm.   Pulmonary/Chest: Breath sounds normal. No accessory muscle usage. No tachypnea. No respiratory distress. She has no decreased breath sounds. She has no wheezes. She has no rhonchi. Right breast exhibits no inverted nipple, no mass, no nipple discharge and no tenderness (no axillary adenopathy). Left breast exhibits no inverted nipple, no mass, no nipple discharge and no tenderness (no axilarry adenopathy).  Abdominal: Soft. Bowel sounds are normal. There is no tenderness.  Musculoskeletal: She exhibits no edema or tenderness.  Lymphadenopathy:    She has no cervical adenopathy.  Neurological: She is alert and oriented to person, place, and time.  Skin: Skin is warm. No rash noted. No erythema.  Psychiatric: She has a normal mood and affect. Her behavior is normal.    BP 110/80 mmHg  Pulse 71  Temp(Src) 98.1 F (36.7 C) (Oral)  Resp 18  Ht 5\' 1"  (1.549 m)  Wt 117 lb 4 oz (53.184 kg)  BMI 22.17 kg/m2  SpO2 99%  LMP 11/28/1998 Wt Readings from Last 3 Encounters:  05/29/16 117 lb 4 oz (53.184 kg)  02/05/16 117 lb 8 oz (53.298 kg)  01/23/16 117 lb 4 oz (53.184 kg)     Lab Results  Component Value Date   WBC 6.9 11/12/2015   HGB 13.6 11/12/2015   HCT 41.7 11/12/2015   PLT 237.0 11/12/2015   GLUCOSE 84 05/22/2016   CHOL 219* 05/22/2016   TRIG 153.0* 05/22/2016   HDL 60.40 05/22/2016   LDLDIRECT 103.0 05/21/2015   LDLCALC 128* 05/22/2016   ALT 18 05/29/2016   AST 18 05/29/2016   NA 143 05/22/2016   K 4.0 05/22/2016   CL 105 05/22/2016   CREATININE 0.87 05/22/2016   BUN 19 05/22/2016   CO2 30 05/22/2016   TSH 2.14  11/12/2015   INR 1.00 11/25/2015    Dg Chest 2 View  11/25/2015  CLINICAL DATA:  Preop rotator cuff surgery EXAM: CHEST  2 VIEW COMPARISON:  08/05/2007 FINDINGS: The heart size and mediastinal contours are within normal limits. Both lungs are clear. The visualized skeletal structures are unremarkable. IMPRESSION: No active cardiopulmonary disease. Electronically Signed   By: Franchot Gallo M.D.   On: 11/25/2015 09:10       Assessment & Plan:   Problem List Items Addressed This Visit    Abnormal liver function tests    Recent liver function tests wnl.       Environmental allergies    Controlled on current regimen.       Health care maintenance    Physical today 05/29/16.  PAP 04/05/13 - ok.  Colonoscopy 12.21/12 - one polyp in the rectosigmoid colon and diverticulosis.        Hypercholesterolemia    On lipitor.  Low cholesterol diet and exercise.  Follow lipid panel and liver function tests.       Relevant Orders   Lipid panel   Hypertension    Blood pressure under good control.  Continue same medication regimen.  Follow pressures.  Follow metabolic panel.        Relevant Orders   CBC with Differential/Platelet   TSH   Basic metabolic panel   Osteoporosis   Relevant Orders   VITAMIN D 25 Hydroxy (Vit-D Deficiency, Fractures)   Pain in joint, shoulder region    S/p surgery as outlined.  Doing well.  Seeing ortho.        Other Visit Diagnoses    Routine general medical examination at a health care facility    -  Primary    Abnormal liver function test        Relevant Orders    Hepatic function panel (Completed)    Hepatic function panel        Einar Pheasant, MD

## 2016-05-29 NOTE — Progress Notes (Signed)
Pre-visit discussion using our clinic review tool. No additional management support is needed unless otherwise documented below in the visit note.  

## 2016-05-29 NOTE — Assessment & Plan Note (Signed)
Physical today 05/29/16.  PAP 04/05/13 - ok.  Colonoscopy 12.21/12 - one polyp in the rectosigmoid colon and diverticulosis.

## 2016-05-31 ENCOUNTER — Encounter: Payer: Self-pay | Admitting: Internal Medicine

## 2016-05-31 NOTE — Assessment & Plan Note (Signed)
On lipitor.  Low cholesterol diet and exercise.  Follow lipid panel and liver function tests.   

## 2016-05-31 NOTE — Assessment & Plan Note (Signed)
Controlled on current regimen.   

## 2016-05-31 NOTE — Assessment & Plan Note (Signed)
Blood pressure under good control.  Continue same medication regimen.  Follow pressures.  Follow metabolic panel.   

## 2016-05-31 NOTE — Assessment & Plan Note (Signed)
S/p surgery as outlined.  Doing well.  Seeing ortho.

## 2016-05-31 NOTE — Assessment & Plan Note (Signed)
Recent liver function tests wnl.  

## 2016-06-01 DIAGNOSIS — M75121 Complete rotator cuff tear or rupture of right shoulder, not specified as traumatic: Secondary | ICD-10-CM | POA: Diagnosis not present

## 2016-07-24 ENCOUNTER — Other Ambulatory Visit: Payer: Self-pay

## 2016-07-24 MED ORDER — ATORVASTATIN CALCIUM 10 MG PO TABS: ORAL_TABLET | ORAL | 5 refills | Status: DC

## 2016-08-13 ENCOUNTER — Encounter: Payer: Self-pay | Admitting: Family Medicine

## 2016-08-13 ENCOUNTER — Ambulatory Visit (INDEPENDENT_AMBULATORY_CARE_PROVIDER_SITE_OTHER): Payer: Medicare Other | Admitting: Family Medicine

## 2016-08-13 VITALS — BP 139/78 | HR 73 | Temp 98.3°F | Wt 115.0 lb

## 2016-08-13 DIAGNOSIS — J3489 Other specified disorders of nose and nasal sinuses: Secondary | ICD-10-CM | POA: Diagnosis not present

## 2016-08-13 DIAGNOSIS — R300 Dysuria: Secondary | ICD-10-CM | POA: Diagnosis not present

## 2016-08-13 LAB — POCT URINALYSIS DIPSTICK
BILIRUBIN UA: NEGATIVE
Glucose, UA: NEGATIVE
KETONES UA: NEGATIVE
Leukocytes, UA: NEGATIVE
Nitrite, UA: NEGATIVE
PH UA: 6
PROTEIN UA: NEGATIVE
RBC UA: NEGATIVE
Urobilinogen, UA: 0.2

## 2016-08-13 NOTE — Progress Notes (Signed)
   Subjective:  Patient ID: LESHIA ROG, female    DOB: 1946/05/19  Age: 70 y.o. MRN: [id number]  CC: Dysuria  HPI:  70 year old female with a history of UTI presents with complaints of dysuria. Patient states that she's also had some sinus pressure.  Patient states that for the past 2 days she's had burning with urination. This is improved as of today. No associated fevers or chills. She does report some suprapubic pressure. No reports of back pain or flank pain. No exacerbating or relieving factors. Patient states that she "just doesn't feel good". Patient also states that she's had some ongoing sinus pressure. He reports a purulent nasal discharge. No medications or interventions tried. No known exacerbating or relieving factors.  Social Hx   Social History   Social History  . Marital status: Married    Spouse name: N/A  . Number of children: 2  . Years of education: N/A   Social History Main Topics  . Smoking status: Never Smoker  . Smokeless tobacco: Never Used  . Alcohol use No  . Drug use: No  . Sexual activity: Yes   Other Topics Concern  . None   Social History Narrative  . None   Review of Systems  Constitutional: Positive for fatigue.  HENT: Positive for sinus pressure.   Genitourinary: Positive for dysuria.   Objective:  BP 139/78 (BP Location: Right Arm, Patient Position: Sitting, Cuff Size: Normal)   Pulse 73   Temp 98.3 F (36.8 C) (Oral)   Wt 115 lb (52.2 kg)   LMP 11/28/1998   SpO2 96%   BMI 21.73 kg/m   BP/Weight 08/13/2016 05/29/2016 [id number]  Systolic BP [id number]  Diastolic BP 78 80 70  Wt. (Lbs) 115 117.25 117.5  BMI 21.73 22.17 22.21   Physical Exam  Constitutional: She appears well-developed. No distress.  Cardiovascular: Normal rate and regular rhythm.   Pulmonary/Chest: Effort normal. She has no wheezes. She has no rales.  Abdominal: Soft. She exhibits no distension. There is no tenderness. There is no rebound and no guarding.    Neurological: She is alert.  Psychiatric: She has a normal mood and affect.  Vitals reviewed.  Lab Results  Component Value Date   WBC 6.9 11/12/2015   HGB 13.6 11/12/2015   HCT 41.7 11/12/2015   PLT 237.0 11/12/2015   GLUCOSE 84 05/22/2016   CHOL 219 (H) 05/22/2016   TRIG 153.0 (H) 05/22/2016   HDL 60.40 05/22/2016   LDLDIRECT 103.0 05/21/2015   LDLCALC 128 (H) 05/22/2016   ALT 18 05/29/2016   AST 18 05/29/2016   NA 143 05/22/2016   K 4.0 05/22/2016   CL 105 05/22/2016   CREATININE 0.87 05/22/2016   BUN 19 05/22/2016   CO2 30 05/22/2016   TSH 2.14 11/12/2015   INR 1.00 11/25/2015   Assessment & Plan:   Problem List Items Addressed This Visit    Dysuria - Primary    New acute problem. Urinalysis normal today. No indication of infection. Sending for culture. Advised supportive care.      Relevant Orders   POCT Urinalysis Dipstick (Completed)   Sinus pressure    New acute issue. No evidence of bacterial infection at this time. No indication for antibiotics. Advised supportive care and over-the-counter remedies.       Other Visit Diagnoses   None.    Follow-up: PRN  [place]

## 2016-08-13 NOTE — Patient Instructions (Signed)
Urine was normal.  Use OTC medications for your sinuses.  Follow up as needed  Take care  Dr. Lacinda Axon

## 2016-08-13 NOTE — Assessment & Plan Note (Signed)
New acute problem. Urinalysis normal today. No indication of infection. Sending for culture. Advised supportive care.

## 2016-08-13 NOTE — Assessment & Plan Note (Signed)
New acute issue. No evidence of bacterial infection at this time. No indication for antibiotics. Advised supportive care and over-the-counter remedies.

## 2016-08-15 DIAGNOSIS — R300 Dysuria: Secondary | ICD-10-CM | POA: Diagnosis not present

## 2016-08-31 ENCOUNTER — Other Ambulatory Visit: Payer: Medicare Other

## 2016-09-01 ENCOUNTER — Other Ambulatory Visit (INDEPENDENT_AMBULATORY_CARE_PROVIDER_SITE_OTHER): Payer: Medicare Other

## 2016-09-01 DIAGNOSIS — I10 Essential (primary) hypertension: Secondary | ICD-10-CM | POA: Diagnosis not present

## 2016-09-01 DIAGNOSIS — M810 Age-related osteoporosis without current pathological fracture: Secondary | ICD-10-CM | POA: Diagnosis not present

## 2016-09-01 DIAGNOSIS — R945 Abnormal results of liver function studies: Secondary | ICD-10-CM

## 2016-09-01 DIAGNOSIS — R7989 Other specified abnormal findings of blood chemistry: Secondary | ICD-10-CM | POA: Diagnosis not present

## 2016-09-01 DIAGNOSIS — E7800 Pure hypercholesterolemia, unspecified: Secondary | ICD-10-CM

## 2016-09-01 LAB — CBC WITH DIFFERENTIAL/PLATELET
Basophils Absolute: 0 10*3/uL (ref 0.0–0.1)
Basophils Relative: 0.4 % (ref 0.0–3.0)
EOS PCT: 3.4 % (ref 0.0–5.0)
Eosinophils Absolute: 0.2 10*3/uL (ref 0.0–0.7)
HCT: 39.4 % (ref 36.0–46.0)
Hemoglobin: 13.5 g/dL (ref 12.0–15.0)
LYMPHS ABS: 1.2 10*3/uL (ref 0.7–4.0)
Lymphocytes Relative: 23 % (ref 12.0–46.0)
MCHC: 34.2 g/dL (ref 30.0–36.0)
MCV: 90.4 fl (ref 78.0–100.0)
MONO ABS: 0.2 10*3/uL (ref 0.1–1.0)
MONOS PCT: 4.5 % (ref 3.0–12.0)
NEUTROS ABS: 3.5 10*3/uL (ref 1.4–7.7)
NEUTROS PCT: 68.7 % (ref 43.0–77.0)
PLATELETS: 203 10*3/uL (ref 150.0–400.0)
RBC: 4.36 Mil/uL (ref 3.87–5.11)
RDW: 13.2 % (ref 11.5–15.5)
WBC: 5.1 10*3/uL (ref 4.0–10.5)

## 2016-09-01 LAB — HEPATIC FUNCTION PANEL
ALK PHOS: 64 U/L (ref 39–117)
ALT: 22 U/L (ref 0–35)
AST: 22 U/L (ref 0–37)
Albumin: 4 g/dL (ref 3.5–5.2)
BILIRUBIN DIRECT: 0.1 mg/dL (ref 0.0–0.3)
BILIRUBIN TOTAL: 0.5 mg/dL (ref 0.2–1.2)
Total Protein: 6.8 g/dL (ref 6.0–8.3)

## 2016-09-01 LAB — LIPID PANEL
CHOL/HDL RATIO: 4
Cholesterol: 210 mg/dL — ABNORMAL HIGH (ref 0–200)
HDL: 54 mg/dL (ref >39.00)
LDL Cholesterol: 129 mg/dL — ABNORMAL HIGH (ref 0–99)
NONHDL: 155.88
Triglycerides: 136 mg/dL (ref 0.0–149.0)
VLDL: 27.2 mg/dL (ref 0.0–40.0)

## 2016-09-01 LAB — BASIC METABOLIC PANEL
BUN: 19 mg/dL (ref 6–23)
CHLORIDE: 104 meq/L (ref 96–112)
CO2: 32 meq/L (ref 19–32)
CREATININE: 0.8 mg/dL (ref 0.40–1.20)
Calcium: 9.8 mg/dL (ref 8.4–10.5)
GFR: 75.24 mL/min (ref >60.00)
GLUCOSE: 88 mg/dL (ref 70–99)
Potassium: 3.9 mEq/L (ref 3.5–5.1)
Sodium: 140 mEq/L (ref 135–145)

## 2016-09-01 LAB — VITAMIN D 25 HYDROXY (VIT D DEFICIENCY, FRACTURES): VITD: 37.21 ng/mL (ref 30.00–100.00)

## 2016-09-01 LAB — TSH: TSH: 1.46 u[IU]/mL (ref 0.35–4.50)

## 2016-09-02 ENCOUNTER — Other Ambulatory Visit: Payer: Medicare Other

## 2016-09-03 ENCOUNTER — Ambulatory Visit (INDEPENDENT_AMBULATORY_CARE_PROVIDER_SITE_OTHER): Payer: Medicare Other | Admitting: Internal Medicine

## 2016-09-03 ENCOUNTER — Encounter: Payer: Self-pay | Admitting: Internal Medicine

## 2016-09-03 VITALS — BP 120/82 | HR 80 | Temp 98.0°F | Resp 18 | Ht 61.0 in | Wt 116.2 lb

## 2016-09-03 DIAGNOSIS — Z9189 Other specified personal risk factors, not elsewhere classified: Secondary | ICD-10-CM

## 2016-09-03 DIAGNOSIS — R634 Abnormal weight loss: Secondary | ICD-10-CM | POA: Diagnosis not present

## 2016-09-03 DIAGNOSIS — Z87898 Personal history of other specified conditions: Secondary | ICD-10-CM

## 2016-09-03 DIAGNOSIS — R945 Abnormal results of liver function studies: Secondary | ICD-10-CM

## 2016-09-03 DIAGNOSIS — N760 Acute vaginitis: Secondary | ICD-10-CM

## 2016-09-03 DIAGNOSIS — R3915 Urgency of urination: Secondary | ICD-10-CM

## 2016-09-03 DIAGNOSIS — Z87440 Personal history of urinary (tract) infections: Secondary | ICD-10-CM

## 2016-09-03 DIAGNOSIS — R350 Frequency of micturition: Secondary | ICD-10-CM

## 2016-09-03 DIAGNOSIS — I10 Essential (primary) hypertension: Secondary | ICD-10-CM

## 2016-09-03 DIAGNOSIS — R7989 Other specified abnormal findings of blood chemistry: Secondary | ICD-10-CM

## 2016-09-03 DIAGNOSIS — R300 Dysuria: Secondary | ICD-10-CM

## 2016-09-03 DIAGNOSIS — E7800 Pure hypercholesterolemia, unspecified: Secondary | ICD-10-CM

## 2016-09-03 LAB — POCT URINALYSIS DIP (MANUAL ENTRY)
Bilirubin, UA: NEGATIVE
GLUCOSE UA: NEGATIVE
Ketones, POC UA: NEGATIVE
Leukocytes, UA: NEGATIVE
NITRITE UA: NEGATIVE
PH UA: 7
Protein Ur, POC: NEGATIVE
UROBILINOGEN UA: 0.2

## 2016-09-03 NOTE — Progress Notes (Signed)
Patient ID: Jamie Elliott, female   DOB: 04-29-1946, 70 y.o.   MRN: [id number]   Subjective:    Patient ID: Jamie Elliott, female    DOB: 1946-10-03, 70 y.o.   MRN: [id number]  HPI  Patient here for a scheduled follow up.  Was initially evaluated here on 08/13/16 for dysuria.  See Dr Jonathon Jordan note.  Felt no infection.  Had persistent symptoms.  Evaluated 08/15/16 at acute care.  Diagnosed with uti.  Placed on macrobid.  Persistent symptoms, so called back to acute care.  abx changed to keflex.  Symptoms improved.  Culture did not reveal definite infection.  Finished abx last week.  States she feels she is having some return of symptoms since being off abx.  Has taken AZO.  She reports noticing some pressure.  Some burning.  No vaginal discharge.  Question of some vaginal burning.  Bowels stable.  She is eating and drinking ok.  No nausea or vomiting.  Breathing stable.     Past Medical History:  Diagnosis Date  . Environmental allergies   . Hypercholesterolemia   . Hypertension   . Migraines    Past Surgical History:  Procedure Laterality Date  . ABDOMINAL HYSTERECTOMY  2000  . APPENDECTOMY  2000  . NOSE SURGERY  1967  . SEPTOPLASTY    . SHOULDER ARTHROSCOPY WITH OPEN ROTATOR CUFF REPAIR Right 12/05/2015   Procedure: SHOULDER ARTHROSCOPY WITH MINI OPEN ROTATOR CUFF REPAIR. DISTAL CLAVICLE EXCISION;  Surgeon: Thornton Park, MD;  Location: [facility] ORS;  Service: Orthopedics;  Laterality: Right;   Family History  Problem Relation Age of Onset  . Heart disease Mother   . Hyperlipidemia Mother   . Hypertension Mother   . Cancer Father     lung, prostate  . Hyperlipidemia Father   . Hypertension Father    Social History   Social History  . Marital status: Married    Spouse name: N/A  . Number of children: 2  . Years of education: N/A   Social History Main Topics  . Smoking status: Never Smoker  . Smokeless tobacco: Never Used  . Alcohol use No  . Drug use: No  . Sexual activity:  Yes   Other Topics Concern  . None   Social History Narrative  . None    Outpatient Encounter Prescriptions as of 09/03/2016  Medication Sig  . atorvastatin (LIPITOR) 10 MG tablet Take 1 tablet (10 mg total) by mouth daily. (Patient taking differently: Take 1 tablet (10 mg total) by mouth daily.)  . Calcium Carbonate-Vitamin D (CALCIUM 600+D) 600-200 MG-UNIT TABS Take by mouth 2 (two) times daily.  . cetirizine (ZYRTEC) 10 MG tablet Take 1 tablet (10 mg total) by mouth daily.  . Coenzyme Q10 (CO Q 10 PO) Take by mouth 2 (two) times daily.  . Hypertonic Nasal Wash (SINUS RINSE NA) Place into the nose daily.  . Omega-3 Fatty Acids (FISH OIL) 1000 MG CAPS Take by mouth 2 (two) times daily.  . Probiotic Product (ALIGN PO) Take by mouth as needed.  . triamcinolone (NASACORT) 55 MCG/ACT nasal inhaler Place 2 sprays into the nose as needed.  . valsartan-hydrochlorothiazide (DIOVAN-HCT) 160-12.5 MG tablet Take 1 tablet by mouth daily.  . [DISCONTINUED] diphenhydrAMINE (BENADRYL) 25 mg capsule Take 25 mg by mouth at bedtime as needed. Reported on 12/31/2015   No facility-administered encounter medications on file as of 09/03/2016.     Review of Systems  Constitutional: Negative for appetite change and unexpected weight  change.  HENT: Negative for congestion and sinus pressure.   Respiratory: Negative for cough, chest tightness and shortness of breath.   Cardiovascular: Negative for chest pain, palpitations and leg swelling.  Gastrointestinal: Negative for abdominal pain, diarrhea, nausea and vomiting.  Genitourinary: Positive for dysuria.       Question of vaginal burning.    Musculoskeletal: Negative for back pain and joint swelling.  Skin: Negative for color change and rash.  Neurological: Negative for dizziness, light-headedness and headaches.  Psychiatric/Behavioral: Negative for agitation and dysphoric mood.       Objective:    Physical Exam  Constitutional: She appears  well-developed and well-nourished. No distress.  HENT:  Nose: Nose normal.  Mouth/Throat: Oropharynx is clear and moist.  Neck: Neck supple. No thyromegaly present.  Cardiovascular: Normal rate and regular rhythm.   Pulmonary/Chest: Breath sounds normal. No respiratory distress. She has no wheezes.  Abdominal: Soft. Bowel sounds are normal. There is no tenderness.  Genitourinary:  Genitourinary Comments: Normal external genitalia.  Vaginal vault without lesions.  Some discharge present.  Could not appreciate any adnexal masses or tenderness.  KOH/wet prep sent.    Musculoskeletal: She exhibits no edema or tenderness.  Lymphadenopathy:    She has no cervical adenopathy.  Skin: No rash noted. No erythema.  Psychiatric: She has a normal mood and affect. Her behavior is normal.    BP 120/82   Pulse 80   Temp 98 F (36.7 C) (Oral)   Resp 18   Ht 5\' 1"  (1.549 m)   Wt 116 lb 4 oz (52.7 kg)   LMP 11/28/1998   SpO2 98%   BMI 21.97 kg/m  Wt Readings from Last 3 Encounters:  09/03/16 116 lb 4 oz (52.7 kg)  08/13/16 115 lb (52.2 kg)  05/29/16 117 lb 4 oz (53.2 kg)     Lab Results  Component Value Date   WBC 5.1 09/01/2016   HGB 13.5 09/01/2016   HCT 39.4 09/01/2016   PLT 203.0 09/01/2016   GLUCOSE 88 09/01/2016   CHOL 210 (H) 09/01/2016   TRIG 136.0 09/01/2016   HDL 54.00 09/01/2016   LDLDIRECT 103.0 05/21/2015   LDLCALC 129 (H) 09/01/2016   ALT 22 09/01/2016   AST 22 09/01/2016   NA 140 09/01/2016   K 3.9 09/01/2016   CL 104 09/01/2016   CREATININE 0.80 09/01/2016   BUN 19 09/01/2016   CO2 32 09/01/2016   TSH 1.46 09/01/2016   INR 1.00 11/25/2015    Dg Chest 2 View  Result Date: 11/25/2015 CLINICAL DATA:  Preop rotator cuff surgery EXAM: CHEST  2 VIEW COMPARISON:  08/05/2007 FINDINGS: The heart size and mediastinal contours are within normal limits. Both lungs are clear. The visualized skeletal structures are unremarkable. IMPRESSION: No active cardiopulmonary  disease. Electronically Signed   By: Franchot Gallo M.D.   On: 11/25/2015 09:10       Assessment & Plan:   Problem List Items Addressed This Visit    Abnormal liver function tests    Liver panel 09/01/16 - wnl.        Dysuria    Recheck urinalysis and culture today.  Hold abx.       History of abnormal mammogram    Mammogram 05/04/16 - ok.  Follow.       History of frequent urinary tract infections    Recent concerns as outlined.  Culture not revealing of infection.  Had macrobid and keflex.  Vaginal exam today as outlined.  Hold on abx.  Await results.  If persistent symptoms and no evidence of infection, will refer back to urology.        Hypercholesterolemia    Cholesterol has improved overall.  On lipitor.  Tolerating.  Will increase lipitor to 4 days/week.  She is hesitant to increase.  Agreed to increase to 4 days per week.  Follow lipid panel and liver function tests.        Hypertension    Blood pressure under good control.  Continue same medication regimen.  Follow pressures.  Follow metabolic panel.        Loss of weight    Weight is stable on the last few checks.  Eating well.  Follow.        Other Visit Diagnoses    Urinary frequency    -  Primary   Relevant Orders   POCT urinalysis dipstick (Completed)   Urine Culture (Completed)   Urinary urgency       Relevant Orders   POCT urinalysis dipstick (Completed)   Urine Culture (Completed)   Vaginitis       Relevant Orders   WET PREP BY MOLECULAR PROBE (Completed)       Einar Pheasant, MD

## 2016-09-03 NOTE — Progress Notes (Signed)
Pre visit review using our clinic review tool, if applicable. No additional management support is needed unless otherwise documented below in the visit note. 

## 2016-09-04 LAB — WET PREP BY MOLECULAR PROBE
CANDIDA SPECIES: NEGATIVE
GARDNERELLA VAGINALIS: NEGATIVE
TRICHOMONAS VAG: NEGATIVE

## 2016-09-05 LAB — URINE CULTURE

## 2016-09-07 ENCOUNTER — Encounter: Payer: Self-pay | Admitting: Internal Medicine

## 2016-09-07 NOTE — Assessment & Plan Note (Signed)
Recheck urinalysis and culture today.  Hold abx.

## 2016-09-07 NOTE — Assessment & Plan Note (Signed)
Recent concerns as outlined.  Culture not revealing of infection.  Had macrobid and keflex.  Vaginal exam today as outlined.  Hold on abx.  Await results.  If persistent symptoms and no evidence of infection, will refer back to urology.

## 2016-09-07 NOTE — Assessment & Plan Note (Signed)
Liver panel 09/01/16 - wnl.

## 2016-09-07 NOTE — Assessment & Plan Note (Signed)
Weight is stable on the last few checks.  Eating well.  Follow.

## 2016-09-07 NOTE — Assessment & Plan Note (Signed)
Mammogram 05/04/16 - ok.  Follow.

## 2016-09-07 NOTE — Assessment & Plan Note (Signed)
Cholesterol has improved overall.  On lipitor.  Tolerating.  Will increase lipitor to 4 days/week.  She is hesitant to increase.  Agreed to increase to 4 days per week.  Follow lipid panel and liver function tests.

## 2016-09-07 NOTE — Assessment & Plan Note (Signed)
Blood pressure under good control.  Continue same medication regimen.  Follow pressures.  Follow metabolic panel.   

## 2016-09-30 ENCOUNTER — Ambulatory Visit (INDEPENDENT_AMBULATORY_CARE_PROVIDER_SITE_OTHER): Payer: Medicare Other

## 2016-09-30 VITALS — BP 118/78 | HR 79 | Temp 97.9°F | Resp 12 | Ht 61.0 in | Wt 117.0 lb

## 2016-09-30 DIAGNOSIS — Z23 Encounter for immunization: Secondary | ICD-10-CM

## 2016-09-30 DIAGNOSIS — Z0000 Encounter for general adult medical examination without abnormal findings: Secondary | ICD-10-CM

## 2016-09-30 NOTE — Progress Notes (Signed)
Subjective:   Jamie Elliott is a 70 y.o. female who presents for Medicare Annual (Subsequent) preventive examination.  Review of Systems:  No ROS.  Medicare Wellness Visit.  Cardiac Risk Factors include: advanced age (>13men, >106 women);hypertension     Objective:     Vitals: BP 118/78 (BP Location: Left Arm, Patient Position: Sitting, Cuff Size: Normal)   Pulse 79   Temp 97.9 F (36.6 C) (Oral)   Resp 12   Ht 5\' 1"  (1.549 m)   Wt 117 lb (53.1 kg)   LMP 11/28/1998   SpO2 98%   BMI 22.11 kg/m   Body mass index is 22.11 kg/m.   Tobacco History  Smoking Status  . Never Smoker  Smokeless Tobacco  . Never Used     Counseling given: Not Answered   Past Medical History:  Diagnosis Date  . Environmental allergies   . Hypercholesterolemia   . Hypertension   . Migraines    Past Surgical History:  Procedure Laterality Date  . ABDOMINAL HYSTERECTOMY  2000  . APPENDECTOMY  2000  . NOSE SURGERY  1967  . SEPTOPLASTY    . SHOULDER ARTHROSCOPY WITH OPEN ROTATOR CUFF REPAIR Right 12/05/2015   Procedure: SHOULDER ARTHROSCOPY WITH MINI OPEN ROTATOR CUFF REPAIR. DISTAL CLAVICLE EXCISION;  Surgeon: Thornton Park, MD;  Location: [facility] ORS;  Service: Orthopedics;  Laterality: Right;   Family History  Problem Relation Age of Onset  . Heart disease Mother   . Hyperlipidemia Mother   . Hypertension Mother   . Cancer Father     lung, prostate  . Hyperlipidemia Father   . Hypertension Father    History  Sexual Activity  . Sexual activity: Not Currently    Outpatient Encounter Prescriptions as of 09/30/2016  Medication Sig  . atorvastatin (LIPITOR) 10 MG tablet Take 1 tablet (10 mg total) by mouth daily. (Patient taking differently: Take 1 tablet (10 mg total) by mouth daily.)  . Calcium Carbonate-Vitamin D (CALCIUM 600+D) 600-200 MG-UNIT TABS Take by mouth 2 (two) times daily.  . cetirizine (ZYRTEC) 10 MG tablet Take 1 tablet (10 mg total) by mouth daily.  . Coenzyme  Q10 (CO Q 10 PO) Take by mouth 2 (two) times daily.  . Hypertonic Nasal Wash (SINUS RINSE NA) Place into the nose daily.  . Omega-3 Fatty Acids (FISH OIL) 1000 MG CAPS Take by mouth 2 (two) times daily.  . Probiotic Product (ALIGN PO) Take by mouth as needed.  . triamcinolone (NASACORT) 55 MCG/ACT nasal inhaler Place 2 sprays into the nose as needed.  . valsartan-hydrochlorothiazide (DIOVAN-HCT) 160-12.5 MG tablet Take 1 tablet by mouth daily.   No facility-administered encounter medications on file as of 09/30/2016.     Activities of Daily Living In your present state of health, do you have any difficulty performing the following activities: 09/30/2016 12/31/2015  Hearing? N N  Vision? N N  Difficulty concentrating or making decisions? N N  Walking or climbing stairs? N N  Dressing or bathing? N N  Doing errands, shopping? N N  Preparing Food and eating ? N -  Using the Toilet? N -  In the past six months, have you accidently leaked urine? N -  Do you have problems with loss of bowel control? N -  Managing your Medications? N -  Managing your Finances? N -  Housekeeping or managing your Housekeeping? N -  Some recent data might be hidden    Patient Care Team: Einar Pheasant, MD as  PCP - General (Internal Medicine)    Assessment:    This is a routine wellness examination for [facility]. The goal of the wellness visit is to assist the patient how to close the gaps in care and create a preventative care plan for the patient.   Taking calcium VIT D as appropriate/Osteoporosis reviewed.  Medications reviewed; taking without issues or barriers.  HTN; well controlled with medication.  Stable and followed by PCP.  Safety issues reviewed;  lives with husband.  Smoke detectors in the home. Firearms locked in a safe in the home. Wears seatbelts when driving or riding with others. No violence in the home.  No identified risk were noted; The patient was oriented x 3; appropriate in  dress and manner and no objective failures at ADL's or IADL's.   Body mass index; normal.  Discussed the importance of a healthy diet, water intake and exercise. She has a healthy diet and adequate water intake.  Her exercise regimen has been challenged by keeping her grandchildren and other activities but she will try to start walking more.  Educational material provided.  Dental; she has seen her dentist (Dr. Rudi Heap) within the  Last 6 months.  Last OV 08/2016.  High dose influenza administered today, L deltoid.  Tolerated well.    Prevnar 13 vaccine postponed until next follow up with PCP.  She would like to discuss previous reaction to the pneumovax 23 vaccine before administration.  Patient Concerns: None at this time. Follow up with PCP as needed.  Exercise Activities and Dietary recommendations Current Exercise Habits: Home exercise routine, Type of exercise: walking, Time (Minutes): 30, Frequency (Times/Week): 1, Weekly Exercise (Minutes/Week): 30, Intensity: Moderate  Goals    . Increase physical activity          Walk 3 times a week for 30-45 minutes a week.   Maintain current weight.    . Increase water intake          Continue to increase water intake 6-8 cups per day.        Fall Risk Fall Risk  09/30/2016 09/30/2015 05/15/2015 04/16/2014 01/15/2013  Falls in the past year? No Yes No No No  Number falls in past yr: - 1 - - -  Injury with Fall? - Yes - - -  Follow up - Education provided;Falls prevention discussed - - -   Depression Screen PHQ 2/9 Scores 09/30/2016 12/31/2015 09/30/2015 05/15/2015  PHQ - 2 Score 0 0 0 0     Cognitive Testing MMSE - Mini Mental State Exam 09/30/2016 09/30/2015  Orientation to time 5 5  Orientation to Place 5 5  Registration 3 3  Attention/ Calculation 5 5  Recall 3 3  Language- name 2 objects 2 2  Language- repeat 1 1  Language- follow 3 step command 3 3  Language- read & follow direction 1 1  Write a sentence 1 1  Copy  design 1 1  Total score 30 30    Immunization History  Administered Date(s) Administered  . Influenza Split 10/27/2012  . Influenza, High Dose Seasonal PF 09/30/2016  . Influenza,inj,Quad PF,36+ Mos 08/16/2014, 08/20/2015  . Pneumococcal Polysaccharide-23 06/05/2013  . Tdap 01/11/2013  . Zoster 06/04/2011   Screening Tests Health Maintenance  Topic Date Due  . PNA vac Low Risk Adult (2 of 2 - PCV13) 06/05/2014  . MAMMOGRAM  05/04/2017  . COLONOSCOPY  12/10/2021  . TETANUS/TDAP  01/11/2023  . INFLUENZA VACCINE  Completed  . DEXA SCAN  Completed  . ZOSTAVAX  Completed  . Hepatitis C Screening  Completed      Plan:    End of life planning; Advance aging; Advanced directives discussed. Copy of current HCPOA/Living Will requested.  Medicare Attestation I have personally reviewed: The patient's medical and social history Their use of alcohol, tobacco or illicit drugs Their current medications and supplements The patient's functional ability including ADLs,fall risks, home safety risks, cognitive, and hearing and visual impairment Diet and physical activities Evidence for depression   The patient's weight, height, BMI, and visual acuity have been recorded in the chart.  I have made referrals and provided education to the patient based on review of the above and I have provided the patient with a written personalized care plan for preventive services.    During the course of the visit the patient was educated and counseled about the following appropriate screening and preventive services:   Vaccines to include Pneumoccal, Influenza, Hepatitis B, Td, Zostavax, HCV  Electrocardiogram  Cardiovascular Disease  Colorectal cancer screening  Bone density screening  Diabetes screening  Glaucoma screening  Mammography/PAP  Nutrition counseling   Patient Instructions (the written plan) was given to the patient.   Varney Biles, LPN  [id number]   Reviewed  above.  Agree with plan.  Dr Nicki Reaper

## 2016-09-30 NOTE — Patient Instructions (Addendum)
[name] , Thank you for taking time to come for your Medicare Wellness Visit. I appreciate your ongoing commitment to your health goals. Please review the following plan we discussed and let me know if I can assist you in the future.   CONSIDER PREVNAR 13 VACCINE AT UPCOMING FOLLOW UP.  FOLLOW UP WITH DR. Nicki Elliott AS NEEDED.  These are the goals we discussed: Goals    . Increase physical activity          Walk 3 times a week for 30-45 minutes a week.   Maintain current weight.    . Increase water intake          Continue to increase water intake 6-8 cups per day.         This is a list of the screening recommended for you and due dates:  Health Maintenance  Topic Date Due  . Pneumonia vaccines (2 of 2 - PCV13) 06/05/2014  . Mammogram  05/04/2017  . Colon Cancer Screening  12/10/2021  . Tetanus Vaccine  01/11/2023  . Flu Shot  Completed  . DEXA scan (bone density measurement)  Completed  . Shingles Vaccine  Completed  .  Hepatitis C: One time screening is recommended by Center for Disease Control  (CDC) for  adults born from [birth year] through 1965.   Completed    Health Maintenance, Female Adopting a healthy lifestyle and getting preventive care can go a long way to promote health and wellness. Talk with your health care provider about what schedule of regular examinations is right for you. This is a good chance for you to check in with your provider about disease prevention and staying healthy. In between checkups, there are plenty of things you can do on your own. Experts have done a lot of research about which lifestyle changes and preventive measures are most likely to keep you healthy. Ask your health care provider for more information. WEIGHT AND DIET  Eat a healthy diet  Be sure to include plenty of vegetables, fruits, low-fat dairy products, and lean protein.  Do not eat a lot of foods high in solid fats, added sugars, or salt.  Get regular exercise. This is one of the  most important things you can do for your health.  Most adults should exercise for at least 150 minutes each week. The exercise should increase your heart rate and make you sweat (moderate-intensity exercise).  Most adults should also do strengthening exercises at least twice a week. This is in addition to the moderate-intensity exercise.  Maintain a healthy weight  Body mass index (BMI) is a measurement that can be used to identify possible weight problems. It estimates body fat based on height and weight. Your health care provider can help determine your BMI and help you achieve or maintain a healthy weight.  For females 76 years of age and older:   A BMI below 18.5 is considered underweight.  A BMI of 18.5 to 24.9 is normal.  A BMI of 25 to 29.9 is considered overweight.  A BMI of 30 and above is considered obese.  Watch levels of cholesterol and blood lipids  You should start having your blood tested for lipids and cholesterol at 70 years of age, then have this test every 5 years.  You may need to have your cholesterol levels checked more often if:  Your lipid or cholesterol levels are high.  You are older than 70 years of age.  You are at high risk  for heart disease.  CANCER SCREENING   Lung Cancer  Lung cancer screening is recommended for adults 22-21 years old who are at high risk for lung cancer because of a history of smoking.  A yearly low-dose CT scan of the lungs is recommended for people who:  Currently smoke.  Have quit within the past 15 years.  Have at least a 30-pack-year history of smoking. A pack year is smoking an average of one pack of cigarettes a day for 1 year.  Yearly screening should continue until it has been 15 years since you quit.  Yearly screening should stop if you develop a health problem that would prevent you from having lung cancer treatment.  Breast Cancer  Practice breast self-awareness. This means understanding how your  breasts normally appear and feel.  It also means doing regular breast self-exams. Let your health care provider know about any changes, no matter how small.  If you are in your 20s or 30s, you should have a clinical breast exam (CBE) by a health care provider every 1-3 years as part of a regular health exam.  If you are 72 or older, have a CBE every year. Also consider having a breast X-ray (mammogram) every year.  If you have a family history of breast cancer, talk to your health care provider about genetic screening.  If you are at high risk for breast cancer, talk to your health care provider about having an MRI and a mammogram every year.  Breast cancer gene (BRCA) assessment is recommended for women who have family members with BRCA-related cancers. BRCA-related cancers include:  Breast.  Ovarian.  Tubal.  Peritoneal cancers.  Results of the assessment will determine the need for genetic counseling and BRCA1 and BRCA2 testing. Cervical Cancer Your health care provider may recommend that you be screened regularly for cancer of the pelvic organs (ovaries, uterus, and vagina). This screening involves a pelvic examination, including checking for microscopic changes to the surface of your cervix (Pap test). You may be encouraged to have this screening done every 3 years, beginning at age 61.  For women ages 53-65, health care providers may recommend pelvic exams and Pap testing every 3 years, or they may recommend the Pap and pelvic exam, combined with testing for human papilloma virus (HPV), every 5 years. Some types of HPV increase your risk of cervical cancer. Testing for HPV may also be done on women of any age with unclear Pap test results.  Other health care providers may not recommend any screening for nonpregnant women who are considered low risk for pelvic cancer and who do not have symptoms. Ask your health care provider if a screening pelvic exam is right for you.  If you  have had past treatment for cervical cancer or a condition that could lead to cancer, you need Pap tests and screening for cancer for at least 20 years after your treatment. If Pap tests have been discontinued, your risk factors (such as having a new sexual partner) need to be reassessed to determine if screening should resume. Some women have medical problems that increase the chance of getting cervical cancer. In these cases, your health care provider may recommend more frequent screening and Pap tests. Colorectal Cancer  This type of cancer can be detected and often prevented.  Routine colorectal cancer screening usually begins at 70 years of age and continues through 70 years of age.  Your health care provider may recommend screening at an earlier age if  you have risk factors for colon cancer.  Your health care provider may also recommend using home test kits to check for hidden blood in the stool.  A small camera at the end of a tube can be used to examine your colon directly (sigmoidoscopy or colonoscopy). This is done to check for the earliest forms of colorectal cancer.  Routine screening usually begins at age 6.  Direct examination of the colon should be repeated every 5-10 years through 70 years of age. However, you may need to be screened more often if early forms of precancerous polyps or small growths are found. Skin Cancer  Check your skin from head to toe regularly.  Tell your health care provider about any new moles or changes in moles, especially if there is a change in a mole's shape or color.  Also tell your health care provider if you have a mole that is larger than the size of a pencil eraser.  Always use sunscreen. Apply sunscreen liberally and repeatedly throughout the day.  Protect yourself by wearing long sleeves, pants, a wide-brimmed hat, and sunglasses whenever you are outside. HEART DISEASE, DIABETES, AND HIGH BLOOD PRESSURE   High blood pressure causes  heart disease and increases the risk of stroke. High blood pressure is more likely to develop in:  People who have blood pressure in the high end of the normal range (130-139/85-89 mm Hg).  People who are overweight or obese.  People who are African American.  If you are 43-64 years of age, have your blood pressure checked every 3-5 years. If you are 17 years of age or older, have your blood pressure checked every year. You should have your blood pressure measured twice--once when you are at a hospital or clinic, and once when you are not at a hospital or clinic. Record the average of the two measurements. To check your blood pressure when you are not at a hospital or clinic, you can use:  An automated blood pressure machine at a pharmacy.  A home blood pressure monitor.  If you are between 27 years and 25 years old, ask your health care provider if you should take aspirin to prevent strokes.  Have regular diabetes screenings. This involves taking a blood sample to check your fasting blood sugar level.  If you are at a normal weight and have a low risk for diabetes, have this test once every three years after 70 years of age.  If you are overweight and have a high risk for diabetes, consider being tested at a younger age or more often. PREVENTING INFECTION  Hepatitis B  If you have a higher risk for hepatitis B, you should be screened for this virus. You are considered at high risk for hepatitis B if:  You were born in a country where hepatitis B is common. Ask your health care provider which countries are considered high risk.  Your parents were born in a high-risk country, and you have not been immunized against hepatitis B (hepatitis B vaccine).  You have HIV or AIDS.  You use needles to inject street drugs.  You live with someone who has hepatitis B.  You have had sex with someone who has hepatitis B.  You get hemodialysis treatment.  You take certain medicines for  conditions, including cancer, organ transplantation, and autoimmune conditions. Hepatitis C  Blood testing is recommended for:  Everyone born from [birth year] through 1965.  Anyone with known risk factors for hepatitis C. Sexually transmitted infections (  STIs)  You should be screened for sexually transmitted infections (STIs) including gonorrhea and chlamydia if:  You are sexually active and are younger than 70 years of age.  You are older than 70 years of age and your health care provider tells you that you are at risk for this type of infection.  Your sexual activity has changed since you were last screened and you are at an increased risk for chlamydia or gonorrhea. Ask your health care provider if you are at risk.  If you do not have HIV, but are at risk, it may be recommended that you take a prescription medicine daily to prevent HIV infection. This is called pre-exposure prophylaxis (PrEP). You are considered at risk if:  You are sexually active and do not regularly use condoms or know the HIV status of your partner(s).  You take drugs by injection.  You are sexually active with a partner who has HIV. Talk with your health care provider about whether you are at high risk of being infected with HIV. If you choose to begin PrEP, you should first be tested for HIV. You should then be tested every 3 months for as long as you are taking PrEP.  PREGNANCY   If you are premenopausal and you may become pregnant, ask your health care provider about preconception counseling.  If you may become pregnant, take 400 to 800 micrograms (mcg) of folic acid every day.  If you want to prevent pregnancy, talk to your health care provider about birth control (contraception). OSTEOPOROSIS AND MENOPAUSE   Osteoporosis is a disease in which the bones lose minerals and strength with aging. This can result in serious bone fractures. Your risk for osteoporosis can be identified using a bone density scan.  If  you are 80 years of age or older, or if you are at risk for osteoporosis and fractures, ask your health care provider if you should be screened.  Ask your health care provider whether you should take a calcium or vitamin D supplement to lower your risk for osteoporosis.  Menopause may have certain physical symptoms and risks.  Hormone replacement therapy may reduce some of these symptoms and risks. Talk to your health care provider about whether hormone replacement therapy is right for you.  HOME CARE INSTRUCTIONS   Schedule regular health, dental, and eye exams.  Stay current with your immunizations.   Do not use any tobacco products including cigarettes, chewing tobacco, or electronic cigarettes.  If you are pregnant, do not drink alcohol.  If you are breastfeeding, limit how much and how often you drink alcohol.  Limit alcohol intake to no more than 1 drink per day for nonpregnant women. One drink equals 12 ounces of beer, 5 ounces of wine, or 1 ounces of hard liquor.  Do not use street drugs.  Do not share needles.  Ask your health care provider for help if you need support or information about quitting drugs.  Tell your health care provider if you often feel depressed.  Tell your health care provider if you have ever been abused or do not feel safe at home.   This information is not intended to replace advice given to you by your health care provider. Make sure you discuss any questions you have with your health care provider.   Document Released: 06/22/2011 Document Revised: 12/28/2014 Document Reviewed: 11/08/2013 Elsevier Interactive Patient Education [employer].

## 2016-10-28 ENCOUNTER — Ambulatory Visit (INDEPENDENT_AMBULATORY_CARE_PROVIDER_SITE_OTHER): Payer: Medicare Other | Admitting: Internal Medicine

## 2016-10-28 ENCOUNTER — Encounter: Payer: Self-pay | Admitting: Internal Medicine

## 2016-10-28 DIAGNOSIS — I10 Essential (primary) hypertension: Secondary | ICD-10-CM

## 2016-10-28 DIAGNOSIS — R7989 Other specified abnormal findings of blood chemistry: Secondary | ICD-10-CM | POA: Diagnosis not present

## 2016-10-28 DIAGNOSIS — E7800 Pure hypercholesterolemia, unspecified: Secondary | ICD-10-CM | POA: Diagnosis not present

## 2016-10-28 DIAGNOSIS — Z9109 Other allergy status, other than to drugs and biological substances: Secondary | ICD-10-CM

## 2016-10-28 DIAGNOSIS — R945 Abnormal results of liver function studies: Secondary | ICD-10-CM

## 2016-10-28 NOTE — Progress Notes (Signed)
Patient ID: Jamie Elliott, female   DOB: 02/10/1946, 70 y.o.   MRN: [id number]   Subjective:    Patient ID: Jamie Elliott, female    DOB: July 06, 1946, 70 y.o.   MRN: [id number]  HPI  Patient here for a scheduled follow up.  States she feels better.  No significant sinus or allergy symptoms.  Minimal congestion.  Sinus worse previously.  No chest pain.  No sob.  No acid reflux.  No abdominal pain.  No nausea or vomiting.  Bowels stable.  No urine change.  Leg pain is better.     Past Medical History:  Diagnosis Date  . Environmental allergies   . Hypercholesterolemia   . Hypertension   . Migraines    Past Surgical History:  Procedure Laterality Date  . ABDOMINAL HYSTERECTOMY  2000  . APPENDECTOMY  2000  . NOSE SURGERY  1967  . SEPTOPLASTY    . SHOULDER ARTHROSCOPY WITH OPEN ROTATOR CUFF REPAIR Right 12/05/2015   Procedure: SHOULDER ARTHROSCOPY WITH MINI OPEN ROTATOR CUFF REPAIR. DISTAL CLAVICLE EXCISION;  Surgeon: Thornton Park, MD;  Location: [facility];  Service: Orthopedics;  Laterality: Right;   Family History  Problem Relation Age of Onset  . Heart disease Mother   . Hyperlipidemia Mother   . Hypertension Mother   . Cancer Father     lung, prostate  . Hyperlipidemia Father   . Hypertension Father    Social History   Social History  . Marital status: Married    Spouse name: N/A  . Number of children: 2  . Years of education: N/A   Social History Main Topics  . Smoking status: Never Smoker  . Smokeless tobacco: Never Used  . Alcohol use No  . Drug use: No  . Sexual activity: Not Currently   Other Topics Concern  . None   Social History Narrative  . None    Outpatient Encounter Prescriptions as of 10/28/2016  Medication Sig  . atorvastatin (LIPITOR) 10 MG tablet Take 1 tablet (10 mg total) by mouth daily. (Patient taking differently: Take 1 tablet (10 mg total) by mouth daily.)  . Calcium Carbonate-Vitamin D (CALCIUM 600+D) 600-200 MG-UNIT TABS Take by mouth  2 (two) times daily.  . cetirizine (ZYRTEC) 10 MG tablet Take 1 tablet (10 mg total) by mouth daily.  . Coenzyme Q10 (CO Q 10 PO) Take by mouth 2 (two) times daily.  . Hypertonic Nasal Wash (SINUS RINSE NA) Place into the nose daily.  . Omega-3 Fatty Acids (FISH OIL) 1000 MG CAPS Take by mouth 2 (two) times daily.  . Probiotic Product (ALIGN PO) Take by mouth as needed.  . triamcinolone (NASACORT) 55 MCG/ACT nasal inhaler Place 2 sprays into the nose as needed.  . valsartan-hydrochlorothiazide (DIOVAN-HCT) 160-12.5 MG tablet Take 1 tablet by mouth daily.   No facility-administered encounter medications on file as of 10/28/2016.     Review of Systems  Constitutional: Negative for appetite change and unexpected weight change.  HENT: Negative for sinus pressure.        No significant congestion.    Respiratory: Negative for cough, chest tightness and shortness of breath.   Cardiovascular: Negative for chest pain, palpitations and leg swelling.  Gastrointestinal: Negative for abdominal pain, diarrhea, nausea and vomiting.  Genitourinary: Negative for difficulty urinating and dysuria.  Musculoskeletal: Negative for joint swelling and myalgias.  Skin: Negative for color change and rash.  Neurological: Negative for dizziness, light-headedness and headaches.  Psychiatric/Behavioral: Negative for agitation and  decreased concentration.       Objective:    Physical Exam  Constitutional: She appears well-developed and well-nourished. No distress.  HENT:  Nose: Nose normal.  Mouth/Throat: Oropharynx is clear and moist.  Neck: Neck supple. No thyromegaly present.  Cardiovascular: Normal rate and regular rhythm.   Pulmonary/Chest: Breath sounds normal. No respiratory distress. She has no wheezes.  Abdominal: Soft. Bowel sounds are normal. There is no tenderness.  Musculoskeletal: She exhibits no edema or tenderness.  Lymphadenopathy:    She has no cervical adenopathy.  Skin: No rash noted.  No erythema.  Psychiatric: She has a normal mood and affect. Her behavior is normal.    BP 140/80   Pulse 75   Temp 98.4 F (36.9 C) (Oral)   Ht 5\' 1"  (1.549 m)   Wt 118 lb 9.6 oz (53.8 kg)   LMP 11/28/1998   SpO2 97%   BMI 22.41 kg/m  Wt Readings from Last 3 Encounters:  10/28/16 118 lb 9.6 oz (53.8 kg)  09/30/16 117 lb (53.1 kg)  09/03/16 116 lb 4 oz (52.7 kg)     Lab Results  Component Value Date   WBC 5.1 09/01/2016   HGB 13.5 09/01/2016   HCT 39.4 09/01/2016   PLT 203.0 09/01/2016   GLUCOSE 88 09/01/2016   CHOL 210 (H) 09/01/2016   TRIG 136.0 09/01/2016   HDL 54.00 09/01/2016   LDLDIRECT 103.0 05/21/2015   LDLCALC 129 (H) 09/01/2016   ALT 22 09/01/2016   AST 22 09/01/2016   NA 140 09/01/2016   K 3.9 09/01/2016   CL 104 09/01/2016   CREATININE 0.80 09/01/2016   BUN 19 09/01/2016   CO2 32 09/01/2016   TSH 1.46 09/01/2016   INR 1.00 11/25/2015    Dg Chest 2 View  Result Date: 11/25/2015 CLINICAL DATA:  Preop rotator cuff surgery EXAM: CHEST  2 VIEW COMPARISON:  08/05/2007 FINDINGS: The heart size and mediastinal contours are within normal limits. Both lungs are clear. The visualized skeletal structures are unremarkable. IMPRESSION: No active cardiopulmonary disease. Electronically Signed   By: Franchot Gallo M.D.   On: 11/25/2015 09:10       Assessment & Plan:   Problem List Items Addressed This Visit    Abnormal liver function tests    Liver panel 09/01/16 - wnl.        Environmental allergies    No significant symptoms now.  Zyrtec.  Follow.       Hypercholesterolemia    Low cholesterol diet and exercise.  On lipitor.  Follow lipid panel and liver function tests.        Relevant Orders   Lipid panel   Hepatic function panel   Hypertension    Blood pressure on recheck 136/78.  Continue same medication regimen.  Follow pressures.  Follow metabolic panel.        Relevant Orders   Basic metabolic panel       Einar Pheasant, MD

## 2016-10-28 NOTE — Progress Notes (Signed)
Pre visit review using our clinic review tool, if applicable. No additional management support is needed unless otherwise documented below in the visit note. 

## 2016-11-08 ENCOUNTER — Encounter: Payer: Self-pay | Admitting: Internal Medicine

## 2016-11-08 NOTE — Assessment & Plan Note (Signed)
Liver panel 09/01/16 - wnl.

## 2016-11-08 NOTE — Assessment & Plan Note (Signed)
Low cholesterol diet and exercise.  On lipitor.  Follow lipid panel and liver function tests.   

## 2016-11-08 NOTE — Assessment & Plan Note (Signed)
Blood pressure on recheck 136/78.  Continue same medication regimen.  Follow pressures.  Follow metabolic panel.

## 2016-11-08 NOTE — Assessment & Plan Note (Signed)
No significant symptoms now.  Zyrtec.  Follow.

## 2016-12-02 ENCOUNTER — Ambulatory Visit (INDEPENDENT_AMBULATORY_CARE_PROVIDER_SITE_OTHER): Payer: Medicare Other | Admitting: Family

## 2016-12-02 ENCOUNTER — Encounter: Payer: Self-pay | Admitting: Family

## 2016-12-02 VITALS — BP 140/60 | HR 80 | Temp 98.2°F | Ht 61.0 in | Wt 120.0 lb

## 2016-12-02 DIAGNOSIS — J0120 Acute ethmoidal sinusitis, unspecified: Secondary | ICD-10-CM

## 2016-12-02 MED ORDER — DOXYCYCLINE HYCLATE 100 MG PO TABS: 100.0000 mg | ORAL_TABLET | Freq: Two times a day (BID) | ORAL | 0 refills | Status: DC

## 2016-12-02 NOTE — Progress Notes (Signed)
Subjective:    Patient ID: Jamie Elliott, female    DOB: 11/08/1946, 70 y.o.   MRN: [id number]  CC: Jamie Elliott is a 70 y.o. female who presents today for an acute visit.    HPI: CC: sinus congestion x 1.5 week, worsening. Endorses sinus pressure, sore throat (resolved). Thick yellow nasal congestion, and HA. No fever, cough , wheezing. No severe HA.   Tried benadryl and tyleonol with no relief.   HTN- Compliant with medications. This morning was 165/ 85 and then came down 120. Denies exertional chest pain or pressure, numbness or tingling radiating to left arm or jaw, palpitations, dizziness, frequent headaches, changes in vision, or shortness of breath.   H/o seasonal allergies      HISTORY:  Past Medical History:  Diagnosis Date  . Environmental allergies   . Hypercholesterolemia   . Hypertension   . Migraines    Past Surgical History:  Procedure Laterality Date  . ABDOMINAL HYSTERECTOMY  2000  . APPENDECTOMY  2000  . NOSE SURGERY  1967  . SEPTOPLASTY    . SHOULDER ARTHROSCOPY WITH OPEN ROTATOR CUFF REPAIR Right 12/05/2015   Procedure: SHOULDER ARTHROSCOPY WITH MINI OPEN ROTATOR CUFF REPAIR. DISTAL CLAVICLE EXCISION;  Surgeon: Thornton Park, MD;  Location: [facility];  Service: Orthopedics;  Laterality: Right;   Family History  Problem Relation Age of Onset  . Heart disease Mother   . Hyperlipidemia Mother   . Hypertension Mother   . Cancer Father     lung, prostate  . Hyperlipidemia Father   . Hypertension Father     Allergies: Ciprofloxacin; Zithromax [azithromycin]; and Augmentin [amoxicillin-pot clavulanate] Current Outpatient Prescriptions on File Prior to Visit  Medication Sig Dispense Refill  . atorvastatin (LIPITOR) 10 MG tablet Take 1 tablet (10 mg total) by mouth daily. (Patient taking differently: Take 1 tablet (10 mg total) by mouth daily.) 30 tablet 5  . Calcium Carbonate-Vitamin D (CALCIUM 600+D) 600-200 MG-UNIT TABS Take by mouth 2 (two) times  daily.    . cetirizine (ZYRTEC) 10 MG tablet Take 1 tablet (10 mg total) by mouth daily. 30 tablet 11  . Coenzyme Q10 (CO Q 10 PO) Take by mouth 2 (two) times daily.    . Hypertonic Nasal Wash (SINUS RINSE NA) Place into the nose daily.    . Omega-3 Fatty Acids (FISH OIL) 1000 MG CAPS Take by mouth 2 (two) times daily.    . Probiotic Product (ALIGN PO) Take by mouth as needed.    . triamcinolone (NASACORT) 55 MCG/ACT nasal inhaler Place 2 sprays into the nose as needed. 1 Inhaler 5  . valsartan-hydrochlorothiazide (DIOVAN-HCT) 160-12.5 MG tablet Take 1 tablet by mouth daily. 30 tablet 9   No current facility-administered medications on file prior to visit.     Social History  Substance Use Topics  . Smoking status: Never Smoker  . Smokeless tobacco: Never Used  . Alcohol use No    Review of Systems  Constitutional: Negative for chills and fever.  HENT: Positive for congestion, rhinorrhea and sinus pressure. Negative for ear pain and sore throat.   Eyes: Negative for visual disturbance.  Respiratory: Negative for cough, shortness of breath and wheezing.   Cardiovascular: Negative for chest pain and palpitations.  Gastrointestinal: Negative for nausea and vomiting.  Neurological: Positive for headaches.      Objective:    BP 140/60   Pulse 80   Temp 98.2 F (36.8 C) (Oral)   Ht 5\' 1"  (1.549  m)   Wt 120 lb (54.4 kg)   LMP 11/28/1998   SpO2 98%   BMI 22.67 kg/m    Physical Exam  Constitutional: She appears well-developed and well-nourished.  HENT:  Head: Normocephalic and atraumatic.  Right Ear: Hearing, tympanic membrane, external ear and ear canal normal. No drainage, swelling or tenderness. No foreign bodies. Tympanic membrane is not erythematous and not bulging. No middle ear effusion. No decreased hearing is noted.  Left Ear: Hearing, tympanic membrane, external ear and ear canal normal. No drainage, swelling or tenderness. No foreign bodies. Tympanic membrane is not  erythematous and not bulging.  No middle ear effusion. No decreased hearing is noted.  Nose: No rhinorrhea. Right sinus exhibits maxillary sinus tenderness and frontal sinus tenderness. Left sinus exhibits maxillary sinus tenderness and frontal sinus tenderness.  Mouth/Throat: Uvula is midline, oropharynx is clear and moist and mucous membranes are normal. No oropharyngeal exudate, posterior oropharyngeal edema, posterior oropharyngeal erythema or tonsillar abscesses.  Eyes: Conjunctivae are normal.  Cardiovascular: Regular rhythm, normal heart sounds and normal pulses.   Pulmonary/Chest: Effort normal and breath sounds normal. She has no wheezes. She has no rhonchi. She has no rales.  Lymphadenopathy:       Head (right side): No submental, no submandibular, no tonsillar, no preauricular, no posterior auricular and no occipital adenopathy present.       Head (left side): No submental, no submandibular, no tonsillar, no preauricular, no posterior auricular and no occipital adenopathy present.    She has no cervical adenopathy.  Neurological: She is alert.  Skin: Skin is warm and dry.  Psychiatric: She has a normal mood and affect. Her speech is normal and behavior is normal. Thought content normal.  Vitals reviewed.      Assessment & Plan:   1. Acute non-recurrent ethmoidal sinusitis Afebrile. Due to duration of symptoms, patient and I jointly agreed to start antibiotic. Encouraged Mucinex. - doxycycline (VIBRA-TABS) 100 MG tablet; Take 1 tablet (100 mg total) by mouth 2 (two) times daily.  Dispense: 10 tablet; Refill: 0    I am having [name] start on doxycycline. I am also having her maintain her Calcium Carbonate-Vitamin D, triamcinolone, Fish Oil, Coenzyme Q10 (CO Q 10 PO), Probiotic Product (ALIGN PO), Hypertonic Nasal Wash (SINUS RINSE NA), cetirizine, valsartan-hydrochlorothiazide, and atorvastatin.   Meds ordered this encounter  Medications  . doxycycline (VIBRA-TABS) 100 MG  tablet    Sig: Take 1 tablet (100 mg total) by mouth 2 (two) times daily.    Dispense:  10 tablet    Refill:  0    Order Specific Question:   Supervising Provider    Answer:   Crecencio Mc [[id number]]    Return precautions given.   Risks, benefits, and alternatives of the medications and treatment plan prescribed today were discussed, and patient expressed understanding.   Education regarding symptom management and diagnosis given to patient on AVS.  Continue to follow with Einar Pheasant, MD for routine health maintenance.   Caroline More and I agreed with plan.   Mable Paris, FNP

## 2016-12-02 NOTE — Patient Instructions (Signed)
Plain mucinex  Increase intake of clear fluids. Congestion is best treated by hydration, when mucus is wetter, it is thinner, less sticky, and easier to expel from the body, either through coughing up drainage, or by blowing your nose.   Get plenty of rest.   Use saline nasal drops and blow your nose frequently. Run a humidifier at night and elevate the head of the bed. Vicks Vapor rub will help with congestion and cough. Steam showers and sinus massage for congestion.   Use Acetaminophen or Ibuprofen as needed for fever or pain. Avoid second hand smoke. Even the smallest exposure will worsen symptoms.   You can also try a teaspoon of honey to see if this will help reduce cough. Throat lozenges can sometimes be beneficial as well.    This illness will typically last 7 - 10 days.   Please follow up with our clinic if you develop a fever greater than 101 F, symptoms worsen, or do not resolve in the next week.

## 2016-12-02 NOTE — Progress Notes (Signed)
Pre visit review using our clinic review tool, if applicable. No additional management support is needed unless otherwise documented below in the visit note. 

## 2016-12-07 ENCOUNTER — Other Ambulatory Visit: Payer: Self-pay | Admitting: Internal Medicine

## 2016-12-29 ENCOUNTER — Other Ambulatory Visit (INDEPENDENT_AMBULATORY_CARE_PROVIDER_SITE_OTHER): Payer: Medicare Other

## 2016-12-29 DIAGNOSIS — E7800 Pure hypercholesterolemia, unspecified: Secondary | ICD-10-CM

## 2016-12-29 DIAGNOSIS — I10 Essential (primary) hypertension: Secondary | ICD-10-CM | POA: Diagnosis not present

## 2016-12-29 LAB — BASIC METABOLIC PANEL
BUN: 16 mg/dL (ref 6–23)
CO2: 31 mEq/L (ref 19–32)
Calcium: 10.5 mg/dL (ref 8.4–10.5)
Chloride: 103 mEq/L (ref 96–112)
Creatinine, Ser: 0.89 mg/dL (ref 0.40–1.20)
GFR: 66.47 mL/min (ref >60.00)
GLUCOSE: 90 mg/dL (ref 70–99)
Potassium: 3.9 mEq/L (ref 3.5–5.1)
SODIUM: 141 meq/L (ref 135–145)

## 2016-12-29 LAB — LIPID PANEL
Cholesterol: 219 mg/dL — ABNORMAL HIGH (ref 0–200)
HDL: 71.4 mg/dL (ref >39.00)
LDL CALC: 125 mg/dL — AB (ref 0–99)
NonHDL: 147.38
Total CHOL/HDL Ratio: 3
Triglycerides: 113 mg/dL (ref 0.0–149.0)
VLDL: 22.6 mg/dL (ref 0.0–40.0)

## 2016-12-29 LAB — HEPATIC FUNCTION PANEL
ALT: 16 U/L (ref 0–35)
AST: 17 U/L (ref 0–37)
Albumin: 4.2 g/dL (ref 3.5–5.2)
Alkaline Phosphatase: 70 U/L (ref 39–117)
BILIRUBIN DIRECT: 0.1 mg/dL (ref 0.0–0.3)
BILIRUBIN TOTAL: 0.5 mg/dL (ref 0.2–1.2)
Total Protein: 7.2 g/dL (ref 6.0–8.3)

## 2016-12-31 ENCOUNTER — Encounter: Payer: Self-pay | Admitting: Internal Medicine

## 2016-12-31 ENCOUNTER — Ambulatory Visit (INDEPENDENT_AMBULATORY_CARE_PROVIDER_SITE_OTHER): Payer: Medicare Other | Admitting: Internal Medicine

## 2016-12-31 DIAGNOSIS — R945 Abnormal results of liver function studies: Secondary | ICD-10-CM

## 2016-12-31 DIAGNOSIS — I10 Essential (primary) hypertension: Secondary | ICD-10-CM

## 2016-12-31 DIAGNOSIS — Z9109 Other allergy status, other than to drugs and biological substances: Secondary | ICD-10-CM

## 2016-12-31 DIAGNOSIS — E7800 Pure hypercholesterolemia, unspecified: Secondary | ICD-10-CM

## 2016-12-31 DIAGNOSIS — R634 Abnormal weight loss: Secondary | ICD-10-CM | POA: Diagnosis not present

## 2016-12-31 DIAGNOSIS — R7989 Other specified abnormal findings of blood chemistry: Secondary | ICD-10-CM

## 2016-12-31 NOTE — Progress Notes (Signed)
Patient ID: Jamie Elliott, female   DOB: 1946-06-13, 71 y.o.   MRN: [id number]   Subjective:    Patient ID: Jamie Elliott, female    DOB: 1946/05/16, 71 y.o.   MRN: [id number]  HPI  Patient here for a scheduled follow up.  She reports she is doing well.  Feels good.  No sinus issues now.  She is walking.  No chest pain.  No sob.  No acid reflux.  No abdominal pain.  No acid reflux.  No abdominal pain or cramping.  Bowels stable.     Past Medical History:  Diagnosis Date  . Environmental allergies   . Hypercholesterolemia   . Hypertension   . Migraines    Past Surgical History:  Procedure Laterality Date  . ABDOMINAL HYSTERECTOMY  2000  . APPENDECTOMY  2000  . NOSE SURGERY  1967  . SEPTOPLASTY    . SHOULDER ARTHROSCOPY WITH OPEN ROTATOR CUFF REPAIR Right 12/05/2015   Procedure: SHOULDER ARTHROSCOPY WITH MINI OPEN ROTATOR CUFF REPAIR. DISTAL CLAVICLE EXCISION;  Surgeon: Thornton Park, MD;  Location: [facility];  Service: Orthopedics;  Laterality: Right;   Family History  Problem Relation Age of Onset  . Heart disease Mother   . Hyperlipidemia Mother   . Hypertension Mother   . Cancer Father     lung, prostate  . Hyperlipidemia Father   . Hypertension Father    Social History   Social History  . Marital status: Married    Spouse name: N/A  . Number of children: 2  . Years of education: N/A   Social History Main Topics  . Smoking status: Never Smoker  . Smokeless tobacco: Never Used  . Alcohol use No  . Drug use: No  . Sexual activity: Not Currently   Other Topics Concern  . None   Social History Narrative  . None    Outpatient Encounter Prescriptions as of 12/31/2016  Medication Sig  . atorvastatin (LIPITOR) 10 MG tablet Take 1 tablet (10 mg total) by mouth daily. (Patient taking differently: Take 1 tablet (10 mg total) by mouth daily.)  . Calcium Carbonate-Vitamin D (CALCIUM 600+D) 600-200 MG-UNIT TABS Take by mouth 2 (two) times daily.  . cetirizine (ZYRTEC)  10 MG tablet Take 1 tablet (10 mg total) by mouth daily.  . Coenzyme Q10 (CO Q 10 PO) Take by mouth 2 (two) times daily.  . Hypertonic Nasal Wash (SINUS RINSE NA) Place into the nose daily.  . Omega-3 Fatty Acids (FISH OIL) 1000 MG CAPS Take by mouth 2 (two) times daily.  . Probiotic Product (ALIGN PO) Take by mouth as needed.  . triamcinolone (NASACORT) 55 MCG/ACT nasal inhaler Place 2 sprays into the nose as needed.  . valsartan-hydrochlorothiazide (DIOVAN-HCT) 160-12.5 MG tablet Take 1 tablet by mouth daily.  . [DISCONTINUED] doxycycline (VIBRA-TABS) 100 MG tablet Take 1 tablet (100 mg total) by mouth 2 (two) times daily.   No facility-administered encounter medications on file as of 12/31/2016.     Review of Systems  Constitutional: Negative for appetite change and unexpected weight change.  HENT: Negative for congestion and sinus pressure.   Respiratory: Negative for cough, chest tightness and shortness of breath.   Cardiovascular: Negative for chest pain, palpitations and leg swelling.  Gastrointestinal: Negative for abdominal pain, diarrhea, nausea and vomiting.  Genitourinary: Negative for difficulty urinating and dysuria.  Musculoskeletal: Negative for back pain and joint swelling.  Skin: Negative for color change and rash.  Neurological: Negative for dizziness, light-headedness  and headaches.  Psychiatric/Behavioral: Negative for agitation and dysphoric mood.       Objective:    Physical Exam  Constitutional: She appears well-developed and well-nourished. No distress.  HENT:  Nose: Nose normal.  Mouth/Throat: Oropharynx is clear and moist.  Neck: Neck supple. No thyromegaly present.  Cardiovascular: Normal rate and regular rhythm.   Pulmonary/Chest: Breath sounds normal. No respiratory distress. She has no wheezes.  Abdominal: Soft. Bowel sounds are normal. There is no tenderness.  Musculoskeletal: She exhibits no edema or tenderness.  Lymphadenopathy:    She has no  cervical adenopathy.  Skin: No rash noted. No erythema.  Psychiatric: She has a normal mood and affect. Her behavior is normal.    BP 112/70   Pulse 70   Temp 97.9 F (36.6 C) (Oral)   Ht 5\' 1"  (1.549 m)   Wt 116 lb 6.4 oz (52.8 kg)   LMP 11/28/1998   SpO2 97%   BMI 21.99 kg/m  Wt Readings from Last 3 Encounters:  12/31/16 116 lb 6.4 oz (52.8 kg)  12/02/16 120 lb (54.4 kg)  10/28/16 118 lb 9.6 oz (53.8 kg)     Lab Results  Component Value Date   WBC 5.1 09/01/2016   HGB 13.5 09/01/2016   HCT 39.4 09/01/2016   PLT 203.0 09/01/2016   GLUCOSE 90 12/29/2016   CHOL 219 (H) 12/29/2016   TRIG 113.0 12/29/2016   HDL 71.40 12/29/2016   LDLDIRECT 103.0 05/21/2015   LDLCALC 125 (H) 12/29/2016   ALT 16 12/29/2016   AST 17 12/29/2016   NA 141 12/29/2016   K 3.9 12/29/2016   CL 103 12/29/2016   CREATININE 0.89 12/29/2016   BUN 16 12/29/2016   CO2 31 12/29/2016   TSH 1.46 09/01/2016   INR 1.00 11/25/2015    Dg Chest 2 View  Result Date: 11/25/2015 CLINICAL DATA:  Preop rotator cuff surgery EXAM: CHEST  2 VIEW COMPARISON:  08/05/2007 FINDINGS: The heart size and mediastinal contours are within normal limits. Both lungs are clear. The visualized skeletal structures are unremarkable. IMPRESSION: No active cardiopulmonary disease. Electronically Signed   By: Franchot Gallo M.D.   On: 11/25/2015 09:10       Assessment & Plan:   Problem List Items Addressed This Visit    Abnormal liver function tests    Last liver panel wnl last check.  Follow.        Environmental allergies    Controlled currently.  Follow.        Hypercholesterolemia    Low cholesterol diet and exercise.  On lipitor.  Follow lipid panel and liver function tests.        Relevant Orders   Hepatic function panel   Lipid panel   Hypertension    Blood pressure under good control.  Continue same medication regimen.  Follow pressures.  Follow metabolic panel.        Relevant Orders   Basic metabolic  panel   Loss of weight    Some weight loss since the last check.  Eating well.  No nausea or vomiting.  Bowels doing well.  Follow.            Einar Pheasant, MD

## 2016-12-31 NOTE — Progress Notes (Signed)
Pre visit review using our clinic review tool, if applicable. No additional management support is needed unless otherwise documented below in the visit note. 

## 2017-01-03 ENCOUNTER — Encounter: Payer: Self-pay | Admitting: Internal Medicine

## 2017-01-03 NOTE — Assessment & Plan Note (Signed)
Low cholesterol diet and exercise.  On lipitor.  Follow lipid panel and liver function tests.   

## 2017-01-03 NOTE — Assessment & Plan Note (Signed)
Last liver panel wnl last check.  Follow.

## 2017-01-03 NOTE — Assessment & Plan Note (Signed)
Blood pressure under good control.  Continue same medication regimen.  Follow pressures.  Follow metabolic panel.   

## 2017-01-03 NOTE — Assessment & Plan Note (Signed)
Controlled currently.  Follow.   

## 2017-01-03 NOTE — Assessment & Plan Note (Signed)
Some weight loss since the last check.  Eating well.  No nausea or vomiting.  Bowels doing well.  Follow.

## 2017-04-15 DIAGNOSIS — R300 Dysuria: Secondary | ICD-10-CM | POA: Diagnosis not present

## 2017-04-15 DIAGNOSIS — N3090 Cystitis, unspecified without hematuria: Secondary | ICD-10-CM | POA: Diagnosis not present

## 2017-04-22 ENCOUNTER — Telehealth: Payer: Self-pay | Admitting: Internal Medicine

## 2017-04-22 NOTE — Telephone Encounter (Signed)
If she is having 7/10 back pain, then she needs to be seen.  I am not sure that this is a side effect of the abx.  Needs evaluation today.

## 2017-04-22 NOTE — Telephone Encounter (Signed)
Pt called about having lower back pain from having the UTI. No appt avail until next week. Pt was transferred to [facility]. Thank you!

## 2017-04-22 NOTE — Telephone Encounter (Signed)
Called patient gave all information she did not want to go to walk in transferred to reception to see if another provider had opening tomorrow. She states pain is better now.

## 2017-04-22 NOTE — Telephone Encounter (Signed)
The nurse line call back stating that pt refused to talk to [facility]. She only wants to speak to Dr Nicki Reaper office. Thank you! Please advise? Thank you!

## 2017-04-22 NOTE — Telephone Encounter (Signed)
Called patient she was seen by Dr. Sherilyn Cooter on 04-15-17 at walk in was told she had UTI. Started on abx. On 04-19-17 was called back when culture came back and abx were changed to ceftin. She started that night has had improvement of symptoms of UTI. Decreased frequency. Did start having some loose stools about 23 days ago on average 2x day. She c/o of back pain 7/10 that started yesterday around lunch. She is not having any N&V or fever. She wanted to know if this could be side effect of the abx or if she needs to b

## 2017-04-23 ENCOUNTER — Ambulatory Visit: Payer: Medicare Other | Admitting: Primary Care

## 2017-04-26 DIAGNOSIS — R399 Unspecified symptoms and signs involving the genitourinary system: Secondary | ICD-10-CM | POA: Diagnosis not present

## 2017-05-05 ENCOUNTER — Other Ambulatory Visit: Payer: Self-pay | Admitting: Internal Medicine

## 2017-05-12 ENCOUNTER — Ambulatory Visit (INDEPENDENT_AMBULATORY_CARE_PROVIDER_SITE_OTHER): Payer: Medicare Other | Admitting: Family Medicine

## 2017-05-12 ENCOUNTER — Ambulatory Visit: Payer: Medicare Other | Admitting: Family

## 2017-05-12 ENCOUNTER — Encounter: Payer: Self-pay | Admitting: Family Medicine

## 2017-05-12 VITALS — BP 148/82 | HR 73 | Temp 98.3°F | Wt 120.2 lb

## 2017-05-12 DIAGNOSIS — R350 Frequency of micturition: Secondary | ICD-10-CM | POA: Diagnosis not present

## 2017-05-12 DIAGNOSIS — N390 Urinary tract infection, site not specified: Secondary | ICD-10-CM | POA: Diagnosis not present

## 2017-05-12 LAB — POC URINALSYSI DIPSTICK (AUTOMATED)
Bilirubin, UA: NEGATIVE
Glucose, UA: NEGATIVE
KETONES UA: NEGATIVE
Nitrite, UA: NEGATIVE
PH UA: 7 (ref 5.0–8.0)
Protein, UA: NEGATIVE
SPEC GRAV UA: 1.01 (ref 1.010–1.025)
UROBILINOGEN UA: 0.2 U/dL

## 2017-05-12 MED ORDER — SULFAMETHOXAZOLE-TRIMETHOPRIM 800-160 MG PO TABS: 1.0000 | ORAL_TABLET | Freq: Two times a day (BID) | ORAL | 0 refills | Status: DC

## 2017-05-12 NOTE — Assessment & Plan Note (Signed)
Symptoms consistent with acute UTI. Urinalysis consistent with UTI. Treating with Bactrim based on most recent culture. Obtaining culture as well.

## 2017-05-12 NOTE — Patient Instructions (Signed)
Medication as prescribed.  Stop the clindamycin.  Take care  Dr. Lacinda Axon

## 2017-05-12 NOTE — Progress Notes (Signed)
Pre visit review using our clinic review tool, if applicable. No additional management support is needed unless otherwise documented below in the visit note. 

## 2017-05-12 NOTE — Progress Notes (Signed)
Subjective:  Patient ID: Jamie Elliott, female    DOB: [birth year]  Age: 71 y.o. MRN: [id number]  CC: UTI  HPI:  71 year old female with a history of recurrent UTI presents with concerns for UTI.  Patient reports that she had a UTI on 4/26. Was seen in the walk-in clinic for this. Treated with Keflex and then this was changed to cefuroxime. She has completed the course. She states that on Monday developed urinary frequency, suprapubic pressure, and mild dysuria. No fever. No reports of flank pain. No known exacerbating or relieving factors. No other associated symptoms. No other complaints at this time.  Social Hx   Social History   Social History  . Marital status: Married    Spouse name: N/A  . Number of children: 2  . Years of education: N/A   Social History Main Topics  . Smoking status: Never Smoker  . Smokeless tobacco: Never Used  . Alcohol use No  . Drug use: No  . Sexual activity: Not Currently   Other Topics Concern  . None   Social History Narrative  . None    Review of Systems  Constitutional: Negative.   Genitourinary: Positive for dysuria and frequency.       Suprapubic pressure.   Objective:  BP (!) 148/82 (BP Location: Left Arm, Patient Position: Sitting, Cuff Size: Normal)   Pulse 73   Temp 98.3 F (36.8 C) (Oral)   Wt 120 lb 4 oz (54.5 kg)   LMP 11/28/1998   SpO2 98%   BMI 22.72 kg/m   BP/Weight 05/12/2017 12/31/2016 17/61/6073  Systolic BP 710 626 948  Diastolic BP 82 70 60  Wt. (Lbs) 120.25 116.4 120  BMI 22.72 21.99 22.67    Physical Exam  Constitutional: She is oriented to person, place, and time. She appears well-developed. No distress.  Cardiovascular: Normal rate and regular rhythm.   Pulmonary/Chest: Effort normal and breath sounds normal.  Abdominal: Soft. She exhibits no distension. There is no tenderness. There is no rebound and no guarding.  Neurological: She is alert and oriented to person, place, and time.  Psychiatric: She  has a normal mood and affect.  Vitals reviewed.   Lab Results  Component Value Date   WBC 5.1 09/01/2016   HGB 13.5 09/01/2016   HCT 39.4 09/01/2016   PLT 203.0 09/01/2016   GLUCOSE 90 12/29/2016   CHOL 219 (H) 12/29/2016   TRIG 113.0 12/29/2016   HDL 71.40 12/29/2016   LDLDIRECT 103.0 05/21/2015   LDLCALC 125 (H) 12/29/2016   ALT 16 12/29/2016   AST 17 12/29/2016   NA 141 12/29/2016   K 3.9 12/29/2016   CL 103 12/29/2016   CREATININE 0.89 12/29/2016   BUN 16 12/29/2016   CO2 31 12/29/2016   TSH 1.46 09/01/2016   INR 1.00 11/25/2015   Results for orders placed or performed in visit on 05/12/17 (from the past 24 hour(s))  POCT Urinalysis Dipstick (Automated)     Status: Abnormal   Collection Time: 05/12/17 10:10 AM  Result Value Ref Range   Color, UA yellow    Clarity, UA clear    Glucose, UA negative    Bilirubin, UA negative    Ketones, UA negative    Spec Grav, UA 1.010 1.010 - 1.025   Blood, UA trace-intact    pH, UA 7.0 5.0 - 8.0   Protein, UA negative    Urobilinogen, UA 0.2 0.2 or 1.0 E.U./dL   Nitrite, UA negative  Leukocytes, UA Large (3+) (A) Negative   Assessment & Plan:   Problem List Items Addressed This Visit    Recurrent UTI - Primary    Symptoms consistent with acute UTI. Urinalysis consistent with UTI. Treating with Bactrim based on most recent culture. Obtaining culture as well.      Relevant Medications   clindamycin (CLEOCIN) 300 MG capsule   sulfamethoxazole-trimethoprim (BACTRIM DS,SEPTRA DS) 800-160 MG tablet   Other Relevant Orders   POCT Urinalysis Dipstick (Automated) (Completed)   Urine Culture      Meds ordered this encounter  Medications  . clindamycin (CLEOCIN) 300 MG capsule  . ibuprofen (ADVIL,MOTRIN) 200 MG tablet    Sig: Take 200 mg by mouth every 6 (six) hours as needed.   Marland Kitchen acetaminophen (TYLENOL) 500 MG tablet    Sig: Take 500 mg by mouth every 6 (six) hours as needed.  . sulfamethoxazole-trimethoprim (BACTRIM  DS,SEPTRA DS) 800-160 MG tablet    Sig: Take 1 tablet by mouth 2 (two) times daily.    Dispense:  10 tablet    Refill:  0    Follow-up: PRN  [place]

## 2017-05-13 LAB — URINE CULTURE: Organism ID, Bacteria: NO GROWTH

## 2017-05-14 ENCOUNTER — Telehealth: Payer: Self-pay

## 2017-05-14 DIAGNOSIS — H43813 Vitreous degeneration, bilateral: Secondary | ICD-10-CM | POA: Diagnosis not present

## 2017-05-14 NOTE — Telephone Encounter (Signed)
Patient was informed of Dr.Sonnenberg statement , she stated that that her symptom are getting better and the clindamycin is from a previous UTi.

## 2017-05-14 NOTE — Telephone Encounter (Signed)
Left message to notify

## 2017-05-14 NOTE — Telephone Encounter (Signed)
Left message to return call 

## 2017-05-14 NOTE — Telephone Encounter (Signed)
Noted. Given that she was on clindamycin previously when the culture was collected she should finish the course of Bactrim. If symptoms do not continue to improve she should be reevaluated.

## 2017-05-14 NOTE — Telephone Encounter (Signed)
-----   Message from Leone Haven, MD sent at 05/14/2017  8:33 AM EDT ----- Patient seen by Dr. Lacinda Axon. Covering for Dr. Lacinda Axon. Please let the patient know her urine culture did not reveal a UTI. Please see if her symptoms have improved. Please also determine why she was on the clindamycin previously. Thanks.

## 2017-05-14 NOTE — Telephone Encounter (Signed)
fyi

## 2017-05-19 ENCOUNTER — Encounter: Payer: Self-pay | Admitting: Family Medicine

## 2017-05-19 ENCOUNTER — Ambulatory Visit (INDEPENDENT_AMBULATORY_CARE_PROVIDER_SITE_OTHER): Payer: Medicare Other | Admitting: Family Medicine

## 2017-05-19 DIAGNOSIS — R21 Rash and other nonspecific skin eruption: Secondary | ICD-10-CM

## 2017-05-19 MED ORDER — TRIAMCINOLONE ACETONIDE 0.1 % EX CREA: 1.0000 "application " | TOPICAL_CREAM | Freq: Two times a day (BID) | CUTANEOUS | 0 refills | Status: DC

## 2017-05-19 NOTE — Assessment & Plan Note (Signed)
New problem. Etiology unclear at this time. I doubt allergic reaction to the medication. Treating empirically with topical triamcinolone. Patient has a dermatologist and she will see them if fails to improve or worsens.

## 2017-05-19 NOTE — Progress Notes (Signed)
Subjective:  Patient ID: Jamie Elliott, female    DOB: 15-Sep-1946  Age: 71 y.o. MRN: [id number]  CC: Rash  HPI:  71 year old female presents with complaints of rash.  Patient reports her rash began Sunday. Uncertain etiology. She thinks it may be from her recent use of Bactrim for UTI. Rashes located on her feet and extends proximally. It is quite pruritic. Associated erythema. She's been using Benadryl and hydrocortisone cream with some improvement. No other associated symptoms. No known exacerbating factors. No other complaints or concerns at this time.  Social Hx   Social History   Social History  . Marital status: Married    Spouse name: N/A  . Number of children: 2  . Years of education: N/A   Social History Main Topics  . Smoking status: Never Smoker  . Smokeless tobacco: Never Used  . Alcohol use No  . Drug use: No  . Sexual activity: Not Currently   Other Topics Concern  . None   Social History Narrative  . None    Review of Systems  Constitutional: Negative.   Skin: Positive for rash.   Objective:  BP 118/78 (BP Location: Left Arm, Patient Position: Sitting, Cuff Size: Normal)   Pulse (!) 102   Temp 98.3 F (36.8 C) (Oral)   Resp 16   Wt 117 lb (53.1 kg)   LMP 11/28/1998   SpO2 99%   BMI 22.11 kg/m   BP/Weight 05/19/2017 05/12/2017 8/67/6195  Systolic BP 093 267 124  Diastolic BP 78 82 70  Wt. (Lbs) 117 120.25 116.4  BMI 22.11 22.72 21.99   Physical Exam  Constitutional: She is oriented to person, place, and time. She appears well-developed. No distress.  Pulmonary/Chest: Effort normal. No respiratory distress.  Neurological: She is alert and oriented to person, place, and time.  Skin:  Feet - erythematous rash noted on the dorsum of both feet and extends proximally to the ankle. Involves toes as well.   Psychiatric: She has a normal mood and affect.  Vitals reviewed.   Lab Results  Component Value Date   WBC 5.1 09/01/2016   HGB 13.5  09/01/2016   HCT 39.4 09/01/2016   PLT 203.0 09/01/2016   GLUCOSE 90 12/29/2016   CHOL 219 (H) 12/29/2016   TRIG 113.0 12/29/2016   HDL 71.40 12/29/2016   LDLDIRECT 103.0 05/21/2015   LDLCALC 125 (H) 12/29/2016   ALT 16 12/29/2016   AST 17 12/29/2016   NA 141 12/29/2016   K 3.9 12/29/2016   CL 103 12/29/2016   CREATININE 0.89 12/29/2016   BUN 16 12/29/2016   CO2 31 12/29/2016   TSH 1.46 09/01/2016   INR 1.00 11/25/2015    Assessment & Plan:   Problem List Items Addressed This Visit      Musculoskeletal and Integument   Rash    New problem. Etiology unclear at this time. I doubt allergic reaction to the medication. Treating empirically with topical triamcinolone. Patient has a dermatologist and she will see them if fails to improve or worsens.         Meds ordered this encounter  Medications  . aspirin 325 MG EC tablet    Sig: Take 325 mg by mouth daily.  Marland Kitchen triamcinolone cream (KENALOG) 0.1 %    Sig: Apply 1 application topically 2 (two) times daily.    Dispense:  30 g    Refill:  0    Follow-up: PRN  [place]  [facility]  

## 2017-05-19 NOTE — Patient Instructions (Signed)
Use the cream.  If you fail to improve, let us know.  Take care  Dr. Lacinda Axon

## 2017-05-20 DIAGNOSIS — L309 Dermatitis, unspecified: Secondary | ICD-10-CM | POA: Diagnosis not present

## 2017-05-20 DIAGNOSIS — D225 Melanocytic nevi of trunk: Secondary | ICD-10-CM | POA: Diagnosis not present

## 2017-05-20 DIAGNOSIS — L821 Other seborrheic keratosis: Secondary | ICD-10-CM | POA: Diagnosis not present

## 2017-05-20 DIAGNOSIS — D2262 Melanocytic nevi of left upper limb, including shoulder: Secondary | ICD-10-CM | POA: Diagnosis not present

## 2017-06-01 ENCOUNTER — Other Ambulatory Visit: Payer: Self-pay | Admitting: Internal Medicine

## 2017-06-07 ENCOUNTER — Other Ambulatory Visit (INDEPENDENT_AMBULATORY_CARE_PROVIDER_SITE_OTHER): Payer: Medicare Other

## 2017-06-07 DIAGNOSIS — E7800 Pure hypercholesterolemia, unspecified: Secondary | ICD-10-CM

## 2017-06-07 DIAGNOSIS — I10 Essential (primary) hypertension: Secondary | ICD-10-CM | POA: Diagnosis not present

## 2017-06-07 LAB — BASIC METABOLIC PANEL
BUN: 22 mg/dL (ref 6–23)
CALCIUM: 9.9 mg/dL (ref 8.4–10.5)
CO2: 30 meq/L (ref 19–32)
Chloride: 105 mEq/L (ref 96–112)
Creatinine, Ser: 0.92 mg/dL (ref 0.40–1.20)
GFR: 63.89 mL/min (ref >60.00)
Glucose, Bld: 89 mg/dL (ref 70–99)
Potassium: 3.7 mEq/L (ref 3.5–5.1)
SODIUM: 140 meq/L (ref 135–145)

## 2017-06-07 LAB — HEPATIC FUNCTION PANEL
ALBUMIN: 4 g/dL (ref 3.5–5.2)
ALT: 18 U/L (ref 0–35)
AST: 17 U/L (ref 0–37)
Alkaline Phosphatase: 69 U/L (ref 39–117)
BILIRUBIN TOTAL: 0.4 mg/dL (ref 0.2–1.2)
Bilirubin, Direct: 0.1 mg/dL (ref 0.0–0.3)
Total Protein: 6.4 g/dL (ref 6.0–8.3)

## 2017-06-07 LAB — LIPID PANEL
CHOLESTEROL: 193 mg/dL (ref 0–200)
HDL: 59.8 mg/dL (ref >39.00)
LDL Cholesterol: 110 mg/dL — ABNORMAL HIGH (ref 0–99)
NonHDL: 133.04
TRIGLYCERIDES: 117 mg/dL (ref 0.0–149.0)
Total CHOL/HDL Ratio: 3
VLDL: 23.4 mg/dL (ref 0.0–40.0)

## 2017-06-09 ENCOUNTER — Encounter: Payer: Medicare Other | Admitting: Internal Medicine

## 2017-06-14 ENCOUNTER — Ambulatory Visit (INDEPENDENT_AMBULATORY_CARE_PROVIDER_SITE_OTHER): Payer: Medicare Other | Admitting: Internal Medicine

## 2017-06-14 ENCOUNTER — Encounter: Payer: Self-pay | Admitting: Internal Medicine

## 2017-06-14 VITALS — BP 130/80 | HR 80 | Temp 97.5°F | Resp 12 | Ht 61.81 in | Wt 119.2 lb

## 2017-06-14 DIAGNOSIS — M810 Age-related osteoporosis without current pathological fracture: Secondary | ICD-10-CM | POA: Diagnosis not present

## 2017-06-14 DIAGNOSIS — Z1239 Encounter for other screening for malignant neoplasm of breast: Secondary | ICD-10-CM

## 2017-06-14 DIAGNOSIS — R200 Anesthesia of skin: Secondary | ICD-10-CM | POA: Diagnosis not present

## 2017-06-14 DIAGNOSIS — N390 Urinary tract infection, site not specified: Secondary | ICD-10-CM

## 2017-06-14 DIAGNOSIS — R21 Rash and other nonspecific skin eruption: Secondary | ICD-10-CM | POA: Diagnosis not present

## 2017-06-14 DIAGNOSIS — R0789 Other chest pain: Secondary | ICD-10-CM | POA: Diagnosis not present

## 2017-06-14 DIAGNOSIS — E7800 Pure hypercholesterolemia, unspecified: Secondary | ICD-10-CM | POA: Diagnosis not present

## 2017-06-14 DIAGNOSIS — I10 Essential (primary) hypertension: Secondary | ICD-10-CM

## 2017-06-14 DIAGNOSIS — E2839 Other primary ovarian failure: Secondary | ICD-10-CM

## 2017-06-14 DIAGNOSIS — R209 Unspecified disturbances of skin sensation: Secondary | ICD-10-CM

## 2017-06-14 DIAGNOSIS — Z0000 Encounter for general adult medical examination without abnormal findings: Secondary | ICD-10-CM | POA: Diagnosis not present

## 2017-06-14 LAB — VITAMIN B12: VITAMIN B 12: 665 pg/mL (ref 211–911)

## 2017-06-14 LAB — CBC WITH DIFFERENTIAL/PLATELET
BASOS PCT: 0.8 % (ref 0.0–3.0)
Basophils Absolute: 0.1 10*3/uL (ref 0.0–0.1)
EOS ABS: 0.2 10*3/uL (ref 0.0–0.7)
EOS PCT: 3 % (ref 0.0–5.0)
HCT: 39.7 % (ref 36.0–46.0)
Hemoglobin: 13.4 g/dL (ref 12.0–15.0)
LYMPHS ABS: 1.6 10*3/uL (ref 0.7–4.0)
Lymphocytes Relative: 19.8 % (ref 12.0–46.0)
MCHC: 33.7 g/dL (ref 30.0–36.0)
MCV: 92.2 fl (ref 78.0–100.0)
MONO ABS: 0.4 10*3/uL (ref 0.1–1.0)
Monocytes Relative: 5.4 % (ref 3.0–12.0)
NEUTROS ABS: 5.8 10*3/uL (ref 1.4–7.7)
Neutrophils Relative %: 71 % (ref 43.0–77.0)
PLATELETS: 231 10*3/uL (ref 150.0–400.0)
RBC: 4.3 Mil/uL (ref 3.87–5.11)
RDW: 13.6 % (ref 11.5–15.5)
WBC: 8.2 10*3/uL (ref 4.0–10.5)

## 2017-06-14 LAB — TSH: TSH: 1.41 u[IU]/mL (ref 0.35–4.50)

## 2017-06-14 MED ORDER — VALSARTAN-HYDROCHLOROTHIAZIDE 160-12.5 MG PO TABS: 1.0000 | ORAL_TABLET | Freq: Every day | ORAL | 2 refills | Status: DC

## 2017-06-14 NOTE — Progress Notes (Signed)
Patient ID: Jamie Elliott, female   DOB: January 08, 1946, 71 y.o.   MRN: [id number]   Subjective:    Patient ID: Jamie Elliott, female    DOB: 1946-01-19, 71 y.o.   MRN: [id number]  HPI  Patient here for her physical exam.  Also has multiple concerns.  She reports she fell in 03/2017.  States she was walking up stairs and looked away and missed the step.  No residual issues from the fall.  She was also evaluated recently for uti.  Treated initially with keflex and then changed to ceftin.  Symptoms have resolved.  No urinary or vaginal symptoms now.  Has been having issues with her teeth.  S/p root canal.  Better.  Some TMJ issues recently.  Is better.  Seeing her dentist.  Noticed a rash on her feet.  Saw Dr Lacinda Axon.  Prescribed topical steroids.  Rash resolved.  She still reports occasionally noticing a different sensation in her feet. May feel a little tight or tingling.  Is not constant.  No weakness.  No rash now.  Also reports noticing some occasional chest pressure.  Does not necessarily correlate with exercise or exertion.  Tries to stay active.  Breathing overall appears to be stable.  She has been under an increased amount of stress recently.  Discussed with her today.  She was wondering if stress could be triggering her symptoms.  No acid reflux.  No abdominal pain.  Bowels moving.     Past Medical History:  Diagnosis Date  . Environmental allergies   . Hypercholesterolemia   . Hypertension   . Migraines    Past Surgical History:  Procedure Laterality Date  . ABDOMINAL HYSTERECTOMY  2000  . APPENDECTOMY  2000  . NOSE SURGERY  1967  . SEPTOPLASTY    . SHOULDER ARTHROSCOPY WITH OPEN ROTATOR CUFF REPAIR Right 12/05/2015   Procedure: SHOULDER ARTHROSCOPY WITH MINI OPEN ROTATOR CUFF REPAIR. DISTAL CLAVICLE EXCISION;  Surgeon: Thornton Park, MD;  Location: [facility] ORS;  Service: Orthopedics;  Laterality: Right;   Family History  Problem Relation Age of Onset  . Heart disease Mother   .  Hyperlipidemia Mother   . Hypertension Mother   . Cancer Father        lung, prostate  . Hyperlipidemia Father   . Hypertension Father    Social History   Social History  . Marital status: Married    Spouse name: N/A  . Number of children: 2  . Years of education: N/A   Social History Main Topics  . Smoking status: Never Smoker  . Smokeless tobacco: Never Used  . Alcohol use No  . Drug use: No  . Sexual activity: Not Currently   Other Topics Concern  . None   Social History Narrative  . None    Outpatient Encounter Prescriptions as of 06/14/2017  Medication Sig  . acetaminophen (TYLENOL) 500 MG tablet Take 500 mg by mouth every 6 (six) hours as needed.  Marland Kitchen aspirin 325 MG EC tablet Take 325 mg by mouth daily.  Marland Kitchen atorvastatin (LIPITOR) 10 MG tablet Take 1 tablet (10 mg total) by mouth daily.  . Calcium Carbonate-Vitamin D (CALCIUM 600+D) 600-200 MG-UNIT TABS Take by mouth 2 (two) times daily.  . cetirizine (ZYRTEC) 10 MG tablet Take 1 tablet (10 mg total) by mouth daily.  . Coenzyme Q10 (CO Q 10 PO) Take by mouth 2 (two) times daily.  . Hypertonic Nasal Wash (SINUS RINSE NA) Place into the nose daily.  Marland Kitchen  ibuprofen (ADVIL,MOTRIN) 200 MG tablet Take 200 mg by mouth every 6 (six) hours as needed.   . Omega-3 Fatty Acids (FISH OIL) 1000 MG CAPS Take by mouth 2 (two) times daily.  . Probiotic Product (ALIGN PO) Take by mouth as needed.  . triamcinolone (NASACORT) 55 MCG/ACT nasal inhaler Place 2 sprays into the nose as needed.  . valsartan-hydrochlorothiazide (DIOVAN-HCT) 160-12.5 MG tablet Take 1 tablet by mouth daily.  . [DISCONTINUED] clindamycin (CLEOCIN) 300 MG capsule   . [DISCONTINUED] sulfamethoxazole-trimethoprim (BACTRIM DS,SEPTRA DS) 800-160 MG tablet Take 1 tablet by mouth 2 (two) times daily.  . [DISCONTINUED] valsartan-hydrochlorothiazide (DIOVAN-HCT) 160-12.5 MG tablet Take 1 tablet by mouth daily.  Marland Kitchen triamcinolone cream (KENALOG) 0.1 % Apply 1 application  topically 2 (two) times daily. (Patient not taking: Reported on 06/14/2017)   No facility-administered encounter medications on file as of 06/14/2017.     Review of Systems  Constitutional: Negative for appetite change and unexpected weight change.  HENT: Negative for congestion, sinus pressure and sore throat.   Eyes: Negative for pain and visual disturbance.  Respiratory: Negative for cough and shortness of breath.   Cardiovascular: Negative for palpitations and leg swelling.       Describes intermittent chest pressure as outlined.    Gastrointestinal: Negative for abdominal pain, diarrhea, nausea and vomiting.  Genitourinary: Negative for difficulty urinating, dysuria, frequency and vaginal discharge.       No urinary or vaginal symptoms now.    Musculoskeletal: Negative for back pain and joint swelling.  Skin: Negative for color change and rash.  Neurological: Negative for dizziness, light-headedness and headaches.  Hematological: Negative for adenopathy. Does not bruise/bleed easily.  Psychiatric/Behavioral: Negative for agitation and dysphoric mood.       Increased stress as outlined.         Objective:    Physical Exam  Constitutional: She is oriented to person, place, and time. She appears well-developed and well-nourished. No distress.  HENT:  Nose: Nose normal.  Mouth/Throat: Oropharynx is clear and moist.  Eyes: Right eye exhibits no discharge. Left eye exhibits no discharge. No scleral icterus.  Neck: Neck supple. No thyromegaly present.  Cardiovascular: Normal rate and regular rhythm.   Pulmonary/Chest: Breath sounds normal. No accessory muscle usage. No tachypnea. No respiratory distress. She has no decreased breath sounds. She has no wheezes. She has no rhonchi. Right breast exhibits no inverted nipple, no mass, no nipple discharge and no tenderness (no axillary adenopathy). Left breast exhibits no inverted nipple, no mass, no nipple discharge and no tenderness (no  axilarry adenopathy).  Abdominal: Soft. Bowel sounds are normal. There is no tenderness.  Musculoskeletal: She exhibits no edema or tenderness.  Lymphadenopathy:    She has no cervical adenopathy.  Neurological: She is alert and oriented to person, place, and time.  Skin: Skin is warm. No rash noted. No erythema.  Psychiatric: She has a normal mood and affect. Her behavior is normal.    BP 130/80 (BP Location: Left Arm, Patient Position: Sitting, Cuff Size: Normal)   Pulse 80   Temp 97.5 F (36.4 C) (Oral)   Resp 12   Ht 5' 1.81" (1.57 m)   Wt 119 lb 3.2 oz (54.1 kg)   LMP 11/28/1998   SpO2 98%   BMI 21.94 kg/m  Wt Readings from Last 3 Encounters:  06/14/17 119 lb 3.2 oz (54.1 kg)  05/19/17 117 lb (53.1 kg)  05/12/17 120 lb 4 oz (54.5 kg)     Lab Results  Component Value Date   WBC 8.2 06/14/2017   HGB 13.4 06/14/2017   HCT 39.7 06/14/2017   PLT 231.0 06/14/2017   GLUCOSE 89 06/07/2017   CHOL 193 06/07/2017   TRIG 117.0 06/07/2017   HDL 59.80 06/07/2017   LDLDIRECT 103.0 05/21/2015   LDLCALC 110 (H) 06/07/2017   ALT 18 06/07/2017   AST 17 06/07/2017   NA 140 06/07/2017   K 3.7 06/07/2017   CL 105 06/07/2017   CREATININE 0.92 06/07/2017   BUN 22 06/07/2017   CO2 30 06/07/2017   TSH 1.41 06/14/2017   INR 1.00 11/25/2015    Dg Chest 2 View  Result Date: 11/25/2015 CLINICAL DATA:  Preop rotator cuff surgery EXAM: CHEST  2 VIEW COMPARISON:  08/05/2007 FINDINGS: The heart size and mediastinal contours are within normal limits. Both lungs are clear. The visualized skeletal structures are unremarkable. IMPRESSION: No active cardiopulmonary disease. Electronically Signed   By: Franchot Gallo M.D.   On: 11/25/2015 09:10       Assessment & Plan:   Problem List Items Addressed This Visit    Altered sensation of foot    Symptoms as outlined.  Sensation intact to pin prick and light touch.  DP pulses palpable and equal bilaterally.  Will check tsh and B12.  Discussed  other w/up, including NCS.  Will hold at this point.  Agreed to labs.  Follow.        Chest pressure - Primary   Relevant Orders   EKG 12-Lead (Completed)   Ambulatory referral to Cardiology   Health care maintenance    Physical today 06/14/17.  PAP 04/05/13 - ok.  She deferred pap today.  Colonoscopy 12/11/11 - one polyp in the rectosigmoid colon and diverticulosis.  Schedule bone density.  Schedule f/u mammogram.  [facility] at [facility].        Hypercholesterolemia    On lipitor.  Low cholesterol diet and exercise.  Follow lipid panel and liver function tests.        Relevant Medications   valsartan-hydrochlorothiazide (DIOVAN-HCT) 160-12.5 MG tablet   Hypertension    Blood pressure under good control.  Continue same medication regimen.  Follow pressures.  Follow metabolic panel.        Relevant Medications   valsartan-hydrochlorothiazide (DIOVAN-HCT) 160-12.5 MG tablet   Osteoporosis    Previously treated with bisphosphonate.  Off now.  See previous notes.  Schedule for f/u bone density.        Rash    Rash resolved.  No rash on exam today.        Recurrent UTI    Recently evaluated.  Symptoms resolved.  Follow.         Other Visit Diagnoses    Numbness in feet       Relevant Orders   CBC with Differential/Platelet (Completed)   TSH (Completed)   Vitamin B12 (Completed)   Breast cancer screening       Relevant Orders   MM DIGITAL SCREENING BILATERAL   Estrogen deficiency       Relevant Orders   DG Bone Density   Routine general medical examination at a health care facility         I spent over 30 minutes with the patient and more than 50% of the time was spent in consultation regarding the above.  Time spent discussing her current issues and concerns and plan for treatment and further w/up.    Einar Pheasant, MD

## 2017-06-14 NOTE — Progress Notes (Signed)
Pre-visit discussion using our clinic review tool. No additional management support is needed unless otherwise documented below in the visit note.  

## 2017-06-15 ENCOUNTER — Telehealth: Payer: Self-pay | Admitting: *Deleted

## 2017-06-15 NOTE — Telephone Encounter (Signed)
See lab notes

## 2017-06-15 NOTE — Telephone Encounter (Signed)
Patient requested lab Pt Contact: [phone]

## 2017-06-16 ENCOUNTER — Encounter: Payer: Self-pay | Admitting: Internal Medicine

## 2017-06-16 DIAGNOSIS — R209 Unspecified disturbances of skin sensation: Secondary | ICD-10-CM | POA: Insufficient documentation

## 2017-06-16 DIAGNOSIS — R0789 Other chest pain: Secondary | ICD-10-CM | POA: Insufficient documentation

## 2017-06-16 NOTE — Assessment & Plan Note (Signed)
Previously treated with bisphosphonate.  Off now.  See previous notes.  Schedule for f/u bone density.

## 2017-06-16 NOTE — Assessment & Plan Note (Signed)
Recently evaluated.  Symptoms resolved.  Follow.

## 2017-06-16 NOTE — Assessment & Plan Note (Signed)
Rash resolved.  No rash on exam today.

## 2017-06-16 NOTE — Assessment & Plan Note (Signed)
Symptoms as outlined.  Sensation intact to pin prick and light touch.  DP pulses palpable and equal bilaterally.  Will check tsh and B12.  Discussed other w/up, including NCS.  Will hold at this point.  Agreed to labs.  Follow.

## 2017-06-16 NOTE — Assessment & Plan Note (Signed)
On lipitor.  Low cholesterol diet and exercise.  Follow lipid panel and liver function tests.   

## 2017-06-16 NOTE — Assessment & Plan Note (Signed)
Physical today 06/14/17.  PAP 04/05/13 - ok.  She deferred pap today.  Colonoscopy 12/11/11 - one polyp in the rectosigmoid colon and diverticulosis.  Schedule bone density.  Schedule f/u mammogram.  Wants at [facility].

## 2017-06-16 NOTE — Assessment & Plan Note (Signed)
Blood pressure under good control.  Continue same medication regimen.  Follow pressures.  Follow metabolic panel.   

## 2017-07-06 ENCOUNTER — Telehealth: Payer: Self-pay | Admitting: Internal Medicine

## 2017-07-06 NOTE — Telephone Encounter (Signed)
Please call her pharmacy and confirm that her medication has been recalled.  If so, then we can change to medication in same family.  Confirm she has not taken any other blood pressure medication.  If no, then have her stop diovan/hctz 160/12.5 and have her start losartan/hctz 100/12.5 q day.

## 2017-07-06 NOTE — Telephone Encounter (Signed)
Pt wanted to know what bp medication she now needs to be on since valsartan-hydrochlorothiazide (DIOVAN-HCT) 160-12.5 MG has been recalled. Please advise

## 2017-07-06 NOTE — Telephone Encounter (Signed)
Please advise 

## 2017-07-07 MED ORDER — LOSARTAN POTASSIUM-HCTZ 100-12.5 MG PO TABS: 1.0000 | ORAL_TABLET | Freq: Every day | ORAL | 3 refills | Status: DC

## 2017-07-07 NOTE — Telephone Encounter (Signed)
Spoke with Pharmacist at [facility] and he stated that diovan was recalled due to [place]. Will call in [place].

## 2017-07-08 NOTE — Telephone Encounter (Signed)
After reviewing chart looks like it was sent in last night

## 2017-07-28 DIAGNOSIS — Z1231 Encounter for screening mammogram for malignant neoplasm of breast: Secondary | ICD-10-CM | POA: Diagnosis not present

## 2017-07-28 DIAGNOSIS — E2839 Other primary ovarian failure: Secondary | ICD-10-CM | POA: Diagnosis not present

## 2017-07-28 LAB — HM MAMMOGRAPHY

## 2017-07-28 LAB — HM DEXA SCAN

## 2017-08-02 ENCOUNTER — Ambulatory Visit (INDEPENDENT_AMBULATORY_CARE_PROVIDER_SITE_OTHER): Payer: Medicare Other | Admitting: Internal Medicine

## 2017-08-02 ENCOUNTER — Encounter: Payer: Self-pay | Admitting: Internal Medicine

## 2017-08-02 DIAGNOSIS — E7800 Pure hypercholesterolemia, unspecified: Secondary | ICD-10-CM

## 2017-08-02 DIAGNOSIS — R209 Unspecified disturbances of skin sensation: Secondary | ICD-10-CM

## 2017-08-02 DIAGNOSIS — R945 Abnormal results of liver function studies: Secondary | ICD-10-CM

## 2017-08-02 DIAGNOSIS — I10 Essential (primary) hypertension: Secondary | ICD-10-CM

## 2017-08-02 DIAGNOSIS — R634 Abnormal weight loss: Secondary | ICD-10-CM

## 2017-08-02 DIAGNOSIS — M810 Age-related osteoporosis without current pathological fracture: Secondary | ICD-10-CM | POA: Diagnosis not present

## 2017-08-02 DIAGNOSIS — R0789 Other chest pain: Secondary | ICD-10-CM | POA: Diagnosis not present

## 2017-08-02 DIAGNOSIS — R7989 Other specified abnormal findings of blood chemistry: Secondary | ICD-10-CM

## 2017-08-02 NOTE — Assessment & Plan Note (Signed)
Previous chest pressure as outlined.  Intermittent chest pain and back pain as outlined.  Question if msk in origin.  Had EKG last visit.  Declined f/u EKG.  She desires to go to [facility] for cardiac evaluation.  Discussed with referral coordinator.  No pain now.

## 2017-08-02 NOTE — Progress Notes (Signed)
Patient ID: Jamie Elliott, female   DOB: 02-Jun-1946, 71 y.o.   MRN: [id number]   Subjective:    Patient ID: Jamie Elliott, female    DOB: [birth year], 71 y.o.   MRN: [id number]  HPI  Patient here for a scheduled follow up.  She reports she is doing relatively well.  Was having chest discomfort previously.  Was scheduled an appt with cardiology.  appt scheduled for 09/16/17.  She reports that she has noticed some intermittent discomfort in her chest and between her shoulder blades.  Does not feel like previous chest pain.  May be more msk.  After discussion, states she would like earlier appt and would like to go to [facility].  No chest pain now.  No sob.  No acid reflux.  No abdominal pain.  Bowels moving.  Just had mammogram.  Ok.  Had bone density.  Need results.     Past Medical History:  Diagnosis Date  . Environmental allergies   . Hypercholesterolemia   . Hypertension   . Migraines    Past Surgical History:  Procedure Laterality Date  . ABDOMINAL HYSTERECTOMY  2000  . APPENDECTOMY  2000  . NOSE SURGERY  1967  . SEPTOPLASTY    . SHOULDER ARTHROSCOPY WITH OPEN ROTATOR CUFF REPAIR Right 12/05/2015   Procedure: SHOULDER ARTHROSCOPY WITH MINI OPEN ROTATOR CUFF REPAIR. DISTAL CLAVICLE EXCISION;  Surgeon: Thornton Park, MD;  Location: [facility] ORS;  Service: Orthopedics;  Laterality: Right;   Family History  Problem Relation Age of Onset  . Heart disease Mother   . Hyperlipidemia Mother   . Hypertension Mother   . Cancer Father        lung, prostate  . Hyperlipidemia Father   . Hypertension Father    Social History   Social History  . Marital status: Married    Spouse name: N/A  . Number of children: 2  . Years of education: N/A   Social History Main Topics  . Smoking status: Never Smoker  . Smokeless tobacco: Never Used  . Alcohol use No  . Drug use: No  . Sexual activity: Not Currently   Other Topics Concern  . None   Social History Narrative  . None    Outpatient  Encounter Prescriptions as of 08/02/2017  Medication Sig  . acetaminophen (TYLENOL) 500 MG tablet Take 500 mg by mouth every 6 (six) hours as needed.  Marland Kitchen aspirin 325 MG EC tablet Take 325 mg by mouth daily.  Marland Kitchen atorvastatin (LIPITOR) 10 MG tablet Take 1 tablet (10 mg total) by mouth daily.  . Calcium Carbonate-Vitamin D (CALCIUM 600+D) 600-200 MG-UNIT TABS Take by mouth 2 (two) times daily.  . cetirizine (ZYRTEC) 10 MG tablet Take 1 tablet (10 mg total) by mouth daily.  . Coenzyme Q10 (CO Q 10 PO) Take by mouth 2 (two) times daily.  . Hypertonic Nasal Wash (SINUS RINSE NA) Place into the nose daily.  Marland Kitchen ibuprofen (ADVIL,MOTRIN) 200 MG tablet Take 200 mg by mouth every 6 (six) hours as needed.   Marland Kitchen losartan-hydrochlorothiazide (HYZAAR) 100-12.5 MG tablet Take 1 tablet by mouth daily.  . Omega-3 Fatty Acids (FISH OIL) 1000 MG CAPS Take by mouth 2 (two) times daily.  . Probiotic Product (ALIGN PO) Take by mouth as needed.  . triamcinolone (NASACORT) 55 MCG/ACT nasal inhaler Place 2 sprays into the nose as needed.  . [DISCONTINUED] triamcinolone cream (KENALOG) 0.1 % Apply 1 application topically 2 (two) times daily. (Patient not taking: Reported on  06/14/2017)  . [DISCONTINUED] valsartan-hydrochlorothiazide (DIOVAN-HCT) 160-12.5 MG tablet Take 1 tablet by mouth daily.   No facility-administered encounter medications on file as of 08/02/2017.     Review of Systems  Constitutional: Negative for appetite change and unexpected weight change.  HENT: Negative for congestion and sinus pressure.   Respiratory: Negative for cough, chest tightness and shortness of breath.   Cardiovascular: Positive for chest pain. Negative for palpitations and leg swelling.  Gastrointestinal: Negative for abdominal pain, diarrhea, nausea and vomiting.  Genitourinary: Negative for difficulty urinating and dysuria.  Musculoskeletal: Positive for back pain. Negative for joint swelling.  Skin: Negative for color change and rash.   Neurological: Negative for dizziness, light-headedness and headaches.  Psychiatric/Behavioral: Negative for agitation and dysphoric mood.       Objective:    Physical Exam  Constitutional: She appears well-developed and well-nourished. No distress.  HENT:  Nose: Nose normal.  Mouth/Throat: Oropharynx is clear and moist.  Neck: Neck supple. No thyromegaly present.  Cardiovascular: Normal rate and regular rhythm.   Pulmonary/Chest: Breath sounds normal. No respiratory distress. She has no wheezes.  Abdominal: Soft. Bowel sounds are normal. There is no tenderness.  Musculoskeletal: She exhibits no edema or tenderness.  Lymphadenopathy:    She has no cervical adenopathy.  Skin: No rash noted. No erythema.  Psychiatric: She has a normal mood and affect. Her behavior is normal.    BP 130/82 (BP Location: Left Arm, Patient Position: Sitting, Cuff Size: Normal)   Pulse 76   Temp 98.6 F (37 C) (Oral)   Resp 12   Ht 5\' 2"  (1.575 m)   Wt 118 lb 3.2 oz (53.6 kg)   LMP 11/28/1998   SpO2 98%   BMI 21.62 kg/m  Wt Readings from Last 3 Encounters:  08/02/17 118 lb 3.2 oz (53.6 kg)  06/14/17 119 lb 3.2 oz (54.1 kg)  05/19/17 117 lb (53.1 kg)     Lab Results  Component Value Date   WBC 8.2 06/14/2017   HGB 13.4 06/14/2017   HCT 39.7 06/14/2017   PLT 231.0 06/14/2017   GLUCOSE 89 06/07/2017   CHOL 193 06/07/2017   TRIG 117.0 06/07/2017   HDL 59.80 06/07/2017   LDLDIRECT 103.0 05/21/2015   LDLCALC 110 (H) 06/07/2017   ALT 18 06/07/2017   AST 17 06/07/2017   NA 140 06/07/2017   K 3.7 06/07/2017   CL 105 06/07/2017   CREATININE 0.92 06/07/2017   BUN 22 06/07/2017   CO2 30 06/07/2017   TSH 1.41 06/14/2017   INR 1.00 11/25/2015    Dg Chest 2 View  Result Date: 11/25/2015 CLINICAL DATA:  Preop rotator cuff surgery EXAM: CHEST  2 VIEW COMPARISON:  08/05/2007 FINDINGS: The heart size and mediastinal contours are within normal limits. Both lungs are clear. The visualized  skeletal structures are unremarkable. IMPRESSION: No active cardiopulmonary disease. Electronically Signed   By: Franchot Gallo M.D.   On: 11/25/2015 09:10       Assessment & Plan:   Problem List Items Addressed This Visit    Abnormal liver function tests    Recheck liver panel.        Altered sensation of foot    Feet - better.        Chest pressure    Previous chest pressure as outlined.  Intermittent chest pain and back pain as outlined.  Question if msk in origin.  Had EKG last visit.  Declined f/u EKG.  She desires to go to  [facility] for cardiac evaluation.  Discussed with referral coordinator.  No pain now.        Hypercholesterolemia    On lipitor.  Low cholesterol diet and exercise.  Follow lipid panel and liver function tests.        Relevant Orders   Hepatic function panel   Lipid panel   Hypertension    Blood pressure under good control.  Continue same medication regimen.  Follow pressures.  Follow metabolic panel.        Relevant Orders   Basic metabolic panel   Loss of weight    Weight stable.  Follow.  Eating well.        Osteoporosis    Previously treated with bisphosphonate.  Off now.  Just had bone density.  Need results.            Einar Pheasant, MD

## 2017-08-02 NOTE — Progress Notes (Signed)
Pre-visit discussion using our clinic review tool. No additional management support is needed unless otherwise documented below in the visit note.  

## 2017-08-02 NOTE — Assessment & Plan Note (Signed)
Feet - better.

## 2017-08-02 NOTE — Assessment & Plan Note (Signed)
Recheck liver panel.  

## 2017-08-02 NOTE — Assessment & Plan Note (Signed)
Previously treated with bisphosphonate.  Off now.  Just had bone density.  Need results.

## 2017-08-02 NOTE — Assessment & Plan Note (Signed)
Weight stable.  Follow.  Eating well.

## 2017-08-02 NOTE — Assessment & Plan Note (Signed)
On lipitor.  Low cholesterol diet and exercise.  Follow lipid panel and liver function tests.   

## 2017-08-02 NOTE — Assessment & Plan Note (Signed)
Blood pressure under good control.  Continue same medication regimen.  Follow pressures.  Follow metabolic panel.   

## 2017-08-04 ENCOUNTER — Telehealth: Payer: Self-pay

## 2017-08-04 DIAGNOSIS — E7800 Pure hypercholesterolemia, unspecified: Secondary | ICD-10-CM | POA: Diagnosis not present

## 2017-08-04 DIAGNOSIS — I208 Other forms of angina pectoris: Secondary | ICD-10-CM | POA: Diagnosis not present

## 2017-08-04 DIAGNOSIS — I10 Essential (primary) hypertension: Secondary | ICD-10-CM | POA: Diagnosis not present

## 2017-08-04 NOTE — Telephone Encounter (Signed)
Results received per Dr. Nicki Reaper called patent and informed that showed osteopenia will start wait bearing exercises and I have mailed diet information to insure that she is increasing dietary calcium. She will call if any questions. Results abstracted.   Also patient states she went to cardiology today and has stress test coming up soon with them wanted to let you know.

## 2017-08-30 DIAGNOSIS — I208 Other forms of angina pectoris: Secondary | ICD-10-CM | POA: Diagnosis not present

## 2017-09-02 ENCOUNTER — Telehealth: Payer: Self-pay | Admitting: Internal Medicine

## 2017-09-02 NOTE — Telephone Encounter (Signed)
Pt called about her left leg pain swollen and pulling. Pt has a appt for tomorrow. Pt was transferred to team health. Thank you!

## 2017-09-03 ENCOUNTER — Ambulatory Visit
Admission: RE | Admit: 2017-09-03 | Discharge: 2017-09-03 | Disposition: A | Discharge status: Home / Self Care | Payer: Medicare Other | Source: Ambulatory Visit | Attending: Family Medicine | Admitting: Family Medicine

## 2017-09-03 ENCOUNTER — Ambulatory Visit (INDEPENDENT_AMBULATORY_CARE_PROVIDER_SITE_OTHER): Payer: Medicare Other | Admitting: Family Medicine

## 2017-09-03 ENCOUNTER — Encounter: Payer: Self-pay | Admitting: Family Medicine

## 2017-09-03 VITALS — BP 150/84 | HR 74 | Temp 98.1°F | Resp 12 | Wt 119.0 lb

## 2017-09-03 DIAGNOSIS — M7989 Other specified soft tissue disorders: Secondary | ICD-10-CM | POA: Insufficient documentation

## 2017-09-03 DIAGNOSIS — M7122 Synovial cyst of popliteal space [Baker], left knee: Secondary | ICD-10-CM | POA: Insufficient documentation

## 2017-09-03 NOTE — Patient Instructions (Addendum)
Rest.  We will call with the results.  Take care  Dr. Lacinda Axon

## 2017-09-03 NOTE — Telephone Encounter (Signed)
Received and review message today.  Pt already seen by Dr Lacinda Axon.

## 2017-09-03 NOTE — Telephone Encounter (Signed)
fyi

## 2017-09-03 NOTE — Progress Notes (Signed)
Subjective:  Patient ID: Jamie Elliott, female    DOB: Jul 26, 1946  Age: 71 y.o. MRN: [id number]  CC: Calf pain, Left leg sweling  HPI:  71 year old female presents with the above complaint.  Patient states that she has recently been increasing her physical activity. She has been walking about an hour a day. Last weekend she noted some pain in her calves bilaterally. She felt that this was due to her increased physical activity. She continued to exercise. She also recently had a stress test and ambulated for that. She states that her pain has been more prominent since that time. The left calf is worse than the right. Additionally, on Wednesday she noted some swelling of her proximal calf and around the knee. No shortness of breath. Seems to be worse with activity. No relieving factors. No other associated symptoms. No other complaints or concerns at this time.  Social Hx   Social History   Social History  . Marital status: Married    Spouse name: N/A  . Number of children: 2  . Years of education: N/A   Social History Main Topics  . Smoking status: Never Smoker  . Smokeless tobacco: Never Used  . Alcohol use No  . Drug use: No  . Sexual activity: Not Currently   Other Topics Concern  . None   Social History Narrative  . None    Review of Systems  Constitutional: Negative.   Respiratory: Negative for shortness of breath.   Cardiovascular: Positive for leg swelling.  Musculoskeletal:       Calf pain.   Objective:  BP (!) 150/84 (BP Location: Left Arm, Patient Position: Sitting, Cuff Size: Normal)   Pulse 74   Temp 98.1 F (36.7 C) (Oral)   Resp 12   Wt 119 lb (54 kg)   LMP 11/28/1998   SpO2 97%   BMI 21.77 kg/m   BP/Weight 09/03/2017 08/02/2017 2/70/3500  Systolic BP 938 182 993  Diastolic BP 84 82 80  Wt. (Lbs) 119 118.2 119.2  BMI 21.77 21.62 21.94   Physical Exam  Constitutional: She is oriented to person, place, and time. She appears well-developed. No  distress.  Cardiovascular: Normal rate and regular rhythm.   No murmur heard. Pulmonary/Chest: Effort normal. She has no wheezes. She has no rales.  Musculoskeletal:  Left leg - no calf swelling. No erythema. Negative Homans sign. Patient does have some tenderness of the knee, particularly the medial joint line. Mild swelling around the knee and the proximal tibia.  Neurological: She is alert and oriented to person, place, and time.  Psychiatric: She has a normal mood and affect.  Vitals reviewed.   Lab Results  Component Value Date   WBC 8.2 06/14/2017   HGB 13.4 06/14/2017   HCT 39.7 06/14/2017   PLT 231.0 06/14/2017   GLUCOSE 89 06/07/2017   CHOL 193 06/07/2017   TRIG 117.0 06/07/2017   HDL 59.80 06/07/2017   LDLDIRECT 103.0 05/21/2015   LDLCALC 110 (H) 06/07/2017   ALT 18 06/07/2017   AST 17 06/07/2017   NA 140 06/07/2017   K 3.7 06/07/2017   CL 105 06/07/2017   CREATININE 0.92 06/07/2017   BUN 22 06/07/2017   CO2 30 06/07/2017   TSH 1.41 06/14/2017   INR 1.00 11/25/2015    Assessment & Plan:   Problem List Items Addressed This Visit    Left leg swelling - Primary    New problem. Exact etiology uncertain at this time. I  suspect that this is musculoskeletal in nature due to recent increase in activity. However, given age and swelling noted, obtaining ultrasound to rule out DVT.      Relevant Orders   US Venous Img Lower Unilateral Left     Follow-up: PRN  [place]

## 2017-09-03 NOTE — Assessment & Plan Note (Signed)
New problem. Exact etiology uncertain at this time. I suspect that this is musculoskeletal in nature due to recent increase in activity. However, given age and swelling noted, obtaining ultrasound to rule out DVT.

## 2017-09-06 DIAGNOSIS — E7800 Pure hypercholesterolemia, unspecified: Secondary | ICD-10-CM | POA: Diagnosis not present

## 2017-09-06 DIAGNOSIS — I208 Other forms of angina pectoris: Secondary | ICD-10-CM | POA: Diagnosis not present

## 2017-09-06 DIAGNOSIS — I10 Essential (primary) hypertension: Secondary | ICD-10-CM | POA: Diagnosis not present

## 2017-09-06 DIAGNOSIS — R0602 Shortness of breath: Secondary | ICD-10-CM | POA: Diagnosis not present

## 2017-09-16 ENCOUNTER — Ambulatory Visit: Payer: Medicare Other | Admitting: Cardiovascular Disease

## 2017-09-27 ENCOUNTER — Ambulatory Visit (INDEPENDENT_AMBULATORY_CARE_PROVIDER_SITE_OTHER): Payer: Medicare Other | Admitting: Internal Medicine

## 2017-09-27 ENCOUNTER — Encounter: Payer: Self-pay | Admitting: Internal Medicine

## 2017-09-27 ENCOUNTER — Telehealth: Payer: Self-pay | Admitting: Internal Medicine

## 2017-09-27 VITALS — BP 112/64 | HR 78 | Temp 98.3°F | Wt 119.0 lb

## 2017-09-27 DIAGNOSIS — J302 Other seasonal allergic rhinitis: Secondary | ICD-10-CM

## 2017-09-27 MED ORDER — METHYLPREDNISOLONE ACETATE 80 MG/ML IJ SUSP
80.0000 mg | Freq: Once | INTRAMUSCULAR | Status: AC
  Administered 2017-09-27: 80 mg via INTRAMUSCULAR

## 2017-09-27 NOTE — Telephone Encounter (Signed)
Pt called c/o congestion, possible sinus infection, sore throat, and slight fever. Please advise, thank you!  Call pt @ [phone] or [phone]

## 2017-09-27 NOTE — Progress Notes (Signed)
Subjective:    Patient ID: Jamie Elliott, female    DOB: 27-Mar-1946, 71 y.o.   MRN: [id number]  HPI  Pt presents to the clinic today with c/o headache, nasal congestion and sore throat. She reports this started 2 days ago. Her headache is located in her temples. She describes the pain as pressure, throbbing. She denies associated dizziness, visual changes, sensitivity to light or sound. She is not able to blow anything out of her nose. She denies difficulty swallowing, cough, fever, chills or body aches. She has tried Zyrtec, Benadryl and Nasonex with some relief. She was working out in the yard prior to onset of symptoms. She has not had sick contacts that she is aware of.   Review of Systems      Past Medical History:  Diagnosis Date  . Environmental allergies   . Hypercholesterolemia   . Hypertension   . Migraines     Current Outpatient Prescriptions  Medication Sig Dispense Refill  . acetaminophen (TYLENOL) 500 MG tablet Take 500 mg by mouth every 6 (six) hours as needed.    Marland Kitchen aspirin 325 MG EC tablet Take 325 mg by mouth daily.    Marland Kitchen atorvastatin (LIPITOR) 10 MG tablet Take 1 tablet (10 mg total) by mouth daily. 30 tablet 0  . Calcium Carbonate-Vitamin D (CALCIUM 600+D) 600-200 MG-UNIT TABS Take by mouth 2 (two) times daily.    . cetirizine (ZYRTEC) 10 MG tablet Take 1 tablet (10 mg total) by mouth daily. 30 tablet 0  . Coenzyme Q10 (CO Q 10 PO) Take by mouth 2 (two) times daily.    . Hypertonic Nasal Wash (SINUS RINSE NA) Place into the nose daily.    Marland Kitchen ibuprofen (ADVIL,MOTRIN) 200 MG tablet Take 200 mg by mouth every 6 (six) hours as needed.     Marland Kitchen losartan-hydrochlorothiazide (HYZAAR) 100-12.5 MG tablet Take 1 tablet by mouth daily. 90 tablet 3  . Omega-3 Fatty Acids (FISH OIL) 1000 MG CAPS Take by mouth 2 (two) times daily.    . Probiotic Product (ALIGN PO) Take by mouth as needed.    . triamcinolone (NASACORT) 55 MCG/ACT nasal inhaler Place 2 sprays into the nose as needed.  1 Inhaler 5   No current facility-administered medications for this visit.     Allergies  Allergen Reactions  . Ciprofloxacin Other (See Comments)    Patient reports "it doesn't work for me". Last culture was sensitive, however.   . Zithromax [Azithromycin]   . Augmentin [Amoxicillin-Pot Clavulanate] Rash    Family History  Problem Relation Age of Onset  . Heart disease Mother   . Hyperlipidemia Mother   . Hypertension Mother   . Cancer Father        lung, prostate  . Hyperlipidemia Father   . Hypertension Father     Social History   Social History  . Marital status: Married    Spouse name: N/A  . Number of children: 2  . Years of education: N/A   Occupational History  . Not on file.   Social History Main Topics  . Smoking status: Never Smoker  . Smokeless tobacco: Never Used  . Alcohol use No  . Drug use: No  . Sexual activity: Not Currently   Other Topics Concern  . Not on file   Social History Narrative  . No narrative on file     Constitutional: Pt reports headache. Denies fever, malaise, fatigue, or abrupt weight changes.  HEENT: Pt reports nasal congestion,  sore throat. Denies eye pain, eye redness, ear pain, ringing in the ears, wax buildup, runny nose, bloody nose. Respiratory: Denies difficulty breathing, shortness of breath, cough or sputum production.    No other specific complaints in a complete review of systems (except as listed in HPI above).  Objective:   Physical Exam   BP 112/64   Pulse 78   Temp 98.3 F (36.8 C) (Oral)   Wt 119 lb (54 kg)   LMP 11/28/1998   SpO2 97%   BMI 21.77 kg/m  Wt Readings from Last 3 Encounters:  09/27/17 119 lb (54 kg)  09/03/17 119 lb (54 kg)  08/02/17 118 lb 3.2 oz (53.6 kg)    General: Appears her stated age, in NAD. HEENT: Head: normal shape and size, no sinus tenderness noted; Ears: Tm's gray and intact, normal light reflex; Nose: mucosa pink and moist, septum midline; Throat/Mouth: Teeth  present, mucosa pink and moist, + PND, no exudate, lesions or ulcerations noted.  Neck:  No adenopathy noted Pulmonary/Chest: Normal effort and positive vesicular breath sounds. No respiratory distress. No wheezes, rales or ronchi noted.    BMET    Component Value Date/Time   NA 140 06/07/2017 0824   K 3.7 06/07/2017 0824   CL 105 06/07/2017 0824   CO2 30 06/07/2017 0824   GLUCOSE 89 06/07/2017 0824   BUN 22 06/07/2017 0824   CREATININE 0.92 06/07/2017 0824   CALCIUM 9.9 06/07/2017 0824   GFRNONAA >60 09/19/2015 2016   GFRAA >60 09/19/2015 2016    Lipid Panel     Component Value Date/Time   CHOL 193 06/07/2017 0824   TRIG 117.0 06/07/2017 0824   HDL 59.80 06/07/2017 0824   CHOLHDL 3 06/07/2017 0824   VLDL 23.4 06/07/2017 0824   LDLCALC 110 (H) 06/07/2017 0824    CBC    Component Value Date/Time   WBC 8.2 06/14/2017 1002   RBC 4.30 06/14/2017 1002   HGB 13.4 06/14/2017 1002   HCT 39.7 06/14/2017 1002   PLT 231.0 06/14/2017 1002   MCV 92.2 06/14/2017 1002   MCH 30.2 09/19/2015 2016   MCHC 33.7 06/14/2017 1002   RDW 13.6 06/14/2017 1002   LYMPHSABS 1.6 06/14/2017 1002   MONOABS 0.4 06/14/2017 1002   EOSABS 0.2 06/14/2017 1002   BASOSABS 0.1 06/14/2017 1002    Hgb A1C No results found for: HGBA1C         Assessment & Plan:   Allergic Rhinitis:  Encouraged her to continue Zyrtec, Benadryl and Nasocort 80 mg Depo IM today  Return precautions discussed Webb Silversmith, NP

## 2017-09-27 NOTE — Patient Instructions (Signed)
Allergic Rhinitis Allergic rhinitis is when the mucous membranes in the nose respond to allergens. Allergens are particles in the air that cause your body to have an allergic reaction. This causes you to release allergic antibodies. Through a chain of events, these eventually cause you to release histamine into the blood stream. Although meant to protect the body, it is this release of histamine that causes your discomfort, such as frequent sneezing, congestion, and an itchy, runny nose. What are the causes? Seasonal allergic rhinitis (hay fever) is caused by pollen allergens that may come from grasses, trees, and weeds. Year-round allergic rhinitis (perennial allergic rhinitis) is caused by allergens such as house dust mites, pet dander, and mold spores. What are the signs or symptoms?  Nasal stuffiness (congestion).  Itchy, runny nose with sneezing and tearing of the eyes. How is this diagnosed? Your health care provider can help you determine the allergen or allergens that trigger your symptoms. If you and your health care provider are unable to determine the allergen, skin or blood testing may be used. Your health care provider will diagnose your condition after taking your health history and performing a physical exam. Your health care provider may assess you for other related conditions, such as asthma, pink eye, or an ear infection. How is this treated? Allergic rhinitis does not have a cure, but it can be controlled by:  Medicines that block allergy symptoms. These may include allergy shots, nasal sprays, and oral antihistamines.  Avoiding the allergen. Hay fever may often be treated with antihistamines in pill or nasal spray forms. Antihistamines block the effects of histamine. There are over-the-counter medicines that may help with nasal congestion and swelling around the eyes. Check with your health care provider before taking or giving this medicine. If avoiding the allergen or the  medicine prescribed do not work, there are many new medicines your health care provider can prescribe. Stronger medicine may be used if initial measures are ineffective. Desensitizing injections can be used if medicine and avoidance does not work. Desensitization is when a patient is given ongoing shots until the body becomes less sensitive to the allergen. Make sure you follow up with your health care provider if problems continue. Follow these instructions at home: It is not possible to completely avoid allergens, but you can reduce your symptoms by taking steps to limit your exposure to them. It helps to know exactly what you are allergic to so that you can avoid your specific triggers. Contact a health care provider if:  You have a fever.  You develop a cough that does not stop easily (persistent).  You have shortness of breath.  You start wheezing.  Symptoms interfere with normal daily activities. This information is not intended to replace advice given to you by your health care provider. Make sure you discuss any questions you have with your health care provider. Document Released: 09/01/2001 Document Revised: 08/07/2016 Document Reviewed: 08/14/2013 Elsevier Interactive Patient Education  2017 Elsevier Inc.  

## 2017-09-27 NOTE — Telephone Encounter (Signed)
Patient called complaining of congestion, sore throat, fever. She says she feels like she may have a sinus infection. I have scheduled patient at [facility] since we do not have any appointments in our office today.

## 2017-09-27 NOTE — Addendum Note (Signed)
Addended by: Emelia Salisbury C on: 09/27/2017 11:08 AM   Modules accepted: Orders

## 2017-09-30 ENCOUNTER — Ambulatory Visit (INDEPENDENT_AMBULATORY_CARE_PROVIDER_SITE_OTHER): Payer: Medicare Other

## 2017-09-30 VITALS — BP 118/62 | HR 92 | Temp 98.4°F | Resp 14 | Ht 61.75 in | Wt 118.4 lb

## 2017-09-30 DIAGNOSIS — Z0000 Encounter for general adult medical examination without abnormal findings: Secondary | ICD-10-CM

## 2017-09-30 DIAGNOSIS — Z1331 Encounter for screening for depression: Secondary | ICD-10-CM

## 2017-09-30 NOTE — Patient Instructions (Addendum)
  Ms. Hyder , Thank you for taking time to come for your Medicare Wellness Visit. I appreciate your ongoing commitment to your health goals. Please review the following plan we discussed and let me know if I can assist you in the future.   Follow up with Dr. Nicki Reaper as needed.    Bring a copy of your [place] and/or Living Will to be scanned into chart.  Have a great day!  These are the goals we discussed: Goals    . Increase physical activity          Walk 3 times a week for 30-45 minutes a week.   Maintain current weight.       This is a list of the screening recommended for you and due dates:  Health Maintenance  Topic Date Due  . Flu Shot  03/20/2018*  . Pneumonia vaccines (2 of 2 - PCV13) 10/20/2018*  . Mammogram  07/28/2018  . Colon Cancer Screening  12/10/2021  . Tetanus Vaccine  01/11/2023  . DEXA scan (bone density measurement)  Completed  .  Hepatitis C: One time screening is recommended by Center for Disease Control  (CDC) for  adults born from [birth year] through 1965.   Completed  *Topic was postponed. The date shown is not the original due date.

## 2017-09-30 NOTE — Progress Notes (Signed)
Subjective:   Jamie Elliott is a 71 y.o. female who presents for Medicare Annual (Subsequent) preventive examination.  Review of Systems:  No ROS.  Medicare Wellness Visit. Additional risk factors are reflected in the social history.  Cardiac Risk Factors include: advanced age (>53men, >45 women);hypertension     Objective:     Vitals: BP 118/62 (BP Location: Left Arm, Patient Position: Sitting, Cuff Size: Normal)   Pulse 92   Temp 98.4 F (36.9 C) (Oral)   Resp 14   Ht 5' 1.75" (1.568 m)   Wt 118 lb 6.4 oz (53.7 kg)   LMP 11/28/1998   SpO2 99%   BMI 21.83 kg/m   Body mass index is 21.83 kg/m.   Tobacco History  Smoking Status  . Never Smoker  Smokeless Tobacco  . Never Used     Counseling given: Not Answered   Past Medical History:  Diagnosis Date  . Environmental allergies   . Hypercholesterolemia   . Hypertension   . Migraines    Past Surgical History:  Procedure Laterality Date  . ABDOMINAL HYSTERECTOMY  2000  . APPENDECTOMY  2000  . NOSE SURGERY  1967  . SEPTOPLASTY    . SHOULDER ARTHROSCOPY WITH OPEN ROTATOR CUFF REPAIR Right 12/05/2015   Procedure: SHOULDER ARTHROSCOPY WITH MINI OPEN ROTATOR CUFF REPAIR. DISTAL CLAVICLE EXCISION;  Surgeon: Thornton Park, MD;  Location: [facility] ORS;  Service: Orthopedics;  Laterality: Right;   Family History  Problem Relation Age of Onset  . Heart disease Mother   . Hyperlipidemia Mother   . Hypertension Mother   . Cancer Father        lung, prostate  . Hyperlipidemia Father   . Hypertension Father    History  Sexual Activity  . Sexual activity: Not Currently    Outpatient Encounter Prescriptions as of 09/30/2017  Medication Sig  . acetaminophen (TYLENOL) 500 MG tablet Take 500 mg by mouth every 6 (six) hours as needed.  Marland Kitchen aspirin 325 MG EC tablet Take 325 mg by mouth daily.  Marland Kitchen atorvastatin (LIPITOR) 10 MG tablet Take 1 tablet (10 mg total) by mouth daily.  . Calcium Carbonate-Vitamin D (CALCIUM  600+D) 600-200 MG-UNIT TABS Take by mouth 2 (two) times daily.  . cetirizine (ZYRTEC) 10 MG tablet Take 1 tablet (10 mg total) by mouth daily.  . Coenzyme Q10 (CO Q 10 PO) Take by mouth 2 (two) times daily.  . Hypertonic Nasal Wash (SINUS RINSE NA) Place into the nose daily.  Marland Kitchen ibuprofen (ADVIL,MOTRIN) 200 MG tablet Take 200 mg by mouth every 6 (six) hours as needed.   Marland Kitchen losartan-hydrochlorothiazide (HYZAAR) 100-12.5 MG tablet Take 1 tablet by mouth daily.  . Omega-3 Fatty Acids (FISH OIL) 1000 MG CAPS Take by mouth 2 (two) times daily.  . Probiotic Product (ALIGN PO) Take by mouth as needed.  . triamcinolone (NASACORT) 55 MCG/ACT nasal inhaler Place 2 sprays into the nose as needed.   No facility-administered encounter medications on file as of 09/30/2017.     Activities of Daily Living In your present state of health, do you have any difficulty performing the following activities: 09/30/2017 09/30/2016  Hearing? N N  Vision? N N  Difficulty concentrating or making decisions? N N  Walking or climbing stairs? N N  Dressing or bathing? N N  Doing errands, shopping? N N  Preparing Food and eating ? N N  Using the Toilet? N N  In the past six months, have you accidently leaked  urine? N N  Do you have problems with loss of bowel control? N N  Managing your Medications? N N  Managing your Finances? N N  Housekeeping or managing your Housekeeping? N N  Some recent data might be hidden    Patient Care Team: Einar Pheasant, MD as PCP - General (Internal Medicine)    Assessment:    This is a routine wellness examination for [place]. The goal of the wellness visit is to assist the patient how to close the gaps in care and create a preventative care plan for the patient.   The roster of all physicians providing medical care to patient is listed in the Snapshot section of the chart.  Taking calcium VIT D as appropriate/Osteoporosis reviewed.    Safety issues reviewed; Smoke and carbon  monoxide detectors in the home. No firearms in the home.  Wears seatbelts when driving or riding with others. Patient does wear sunscreen or protective clothing when in direct sunlight. No violence in the home.  Depression- PHQ 2 &9 complete.  No signs/symptoms or verbal communication regarding little pleasure in doing things, feeling down, depressed or hopeless. No changes in sleeping, energy, eating, concentrating.  No thoughts of self harm or harm towards others.  Time spent on this topic is 8 minutes.   Patient is alert, normal appearance, oriented to person/place/and time.  Correctly identified the president of the [place], recall of 3/3 words, and performing simple calculations. Displays appropriate judgement and can read correct time from watch face.   No new identified risk were noted.  No failures at ADL's or IADL's.    BMI- discussed the importance of a healthy diet, water intake and the benefits of aerobic exercise. Educational material provided.   24 hour diet recall: Breakfast: Cereal, fruit Lunch: Chicken salad sandwich Dinner: soup and salad Snack:ice cream cone Daily fluid intake: 0 cups of caffeine, 5 cups of water  Dental- every 6 months.  Eye- Visual acuity not assessed per patient preference since they have regular follow up with the ophthalmologist.  Wears corrective lenses.  Sleep patterns- Sleeps 8-9 hours at night.  Wakes feeling rested.  Patient Concerns: Seen on 09/27/17 for seasonal allergic rhinitis; she is concerned symptoms are not resolving soon enough.  Encouraged to schedule a follow up with physician in office if symptoms worsen or persist.  Exercise Activities and Dietary recommendations Current Exercise Habits: The patient does not participate in regular exercise at present  Goals    . Increase physical activity          Walk 3 times a week for 30-45 minutes a week.   Maintain current weight.      Fall Risk Fall Risk  09/30/2017 10/28/2016  09/30/2016 09/30/2015 05/15/2015  Falls in the past year? No No No Yes No  Number falls in past yr: - - - 1 -  Injury with Fall? - - - Yes -  Comment - - - Reinjured R shoulder.  XRAY completed. -  Follow up - - - Education provided;Falls prevention discussed -   Depression Screen PHQ 2/9 Scores 09/30/2017 10/28/2016 09/30/2016 12/31/2015  PHQ - 2 Score 0 0 0 0  PHQ- 9 Score 0 - - -     Cognitive Function MMSE - Mini Mental State Exam 09/30/2017 09/30/2016 09/30/2015  Orientation to time 5 5 5   Orientation to Place 5 5 5   Registration 3 3 3   Attention/ Calculation 5 5 5   Recall 3 3 3   Language- name  2 objects 2 2 2   Language- repeat 1 1 1   Language- follow 3 step command 3 3 3   Language- read & follow direction 1 1 1   Write a sentence 1 1 1   Copy design 1 1 1   Total score 30 30 30         Immunization History  Administered Date(s) Administered  . Influenza Split 10/27/2012  . Influenza, High Dose Seasonal PF 09/30/2016  . Influenza,inj,Quad PF,6+ Mos 08/16/2014, 08/20/2015  . Pneumococcal Polysaccharide-23 06/05/2013  . Tdap 01/11/2013  . Zoster 06/04/2011   Screening Tests Health Maintenance  Topic Date Due  . INFLUENZA VACCINE  03/20/2018 (Originally 07/21/2017)  . PNA vac Low Risk Adult (2 of 2 - PCV13) 10/20/2018 (Originally 06/05/2014)  . MAMMOGRAM  07/28/2018  . COLONOSCOPY  12/10/2021  . TETANUS/TDAP  01/11/2023  . DEXA SCAN  Completed  . Hepatitis C Screening  Completed      Plan:    End of life planning; Advance aging; Advanced directives discussed. Copy of current HCPOA/Living Will requested.    I have personally reviewed and noted the following in the patient's chart:   . Medical and social history . Use of alcohol, tobacco or illicit drugs  . Current medications and supplements . Functional ability and status . Nutritional status . Physical activity . Advanced directives . List of other physicians . Hospitalizations, surgeries, and ER visits in  previous 12 months . Vitals . Screenings to include cognitive, depression, and falls . Referrals and appointments  In addition, I have reviewed and discussed with patient certain preventive protocols, quality metrics, and best practice recommendations. A written personalized care plan for preventive services as well as general preventive health recommendations were provided to patient.     Varney Biles, LPN  [id number]   Reviewed above information.  Agree with assessment and plan.  Pt was evaluated by me 10/01/17 for congestion and cough.    Dr Nicki Reaper

## 2017-10-01 ENCOUNTER — Encounter: Payer: Self-pay | Admitting: Internal Medicine

## 2017-10-01 ENCOUNTER — Ambulatory Visit (INDEPENDENT_AMBULATORY_CARE_PROVIDER_SITE_OTHER): Payer: Medicare Other | Admitting: Internal Medicine

## 2017-10-01 DIAGNOSIS — J329 Chronic sinusitis, unspecified: Secondary | ICD-10-CM | POA: Diagnosis not present

## 2017-10-01 MED ORDER — DOXYCYCLINE HYCLATE 100 MG PO TABS: 100.0000 mg | ORAL_TABLET | Freq: Two times a day (BID) | ORAL | 0 refills | Status: DC

## 2017-10-01 NOTE — Progress Notes (Signed)
Patient ID: Jamie Elliott, female   DOB: 08/04/1946, 71 y.o.   MRN: [id number]   Subjective:    Patient ID: Jamie Elliott, female    DOB: June 23, 1946, 71 y.o.   MRN: [id number]  HPI  Patient here as a work in with concerns regarding increased cough and congestion.  States symptoms started approximately one week ago.  Started with sore throat and hoarseness.  Was [facility].  Given steroid injection.  States may have minimally helped.  Increased drainage.  Increased cough.  Increased nasal congestion and yellow/clear/thick mucus.  Drainage makes her feel queezy.  No vomiting.  She is eating.  No chest congestion.  Feels like sinus infection she has had previously.  Progressing.  Has tried benadryl, nasacort, saline and gargling salt water.     Past Medical History:  Diagnosis Date  . Environmental allergies   . Hypercholesterolemia   . Hypertension   . Migraines    Past Surgical History:  Procedure Laterality Date  . ABDOMINAL HYSTERECTOMY  2000  . APPENDECTOMY  2000  . NOSE SURGERY  1967  . SEPTOPLASTY    . SHOULDER ARTHROSCOPY WITH OPEN ROTATOR CUFF REPAIR Right 12/05/2015   Procedure: SHOULDER ARTHROSCOPY WITH MINI OPEN ROTATOR CUFF REPAIR. DISTAL CLAVICLE EXCISION;  Surgeon: Thornton Park, MD;  Location: [facility] ORS;  Service: Orthopedics;  Laterality: Right;   Family History  Problem Relation Age of Onset  . Heart disease Mother   . Hyperlipidemia Mother   . Hypertension Mother   . Cancer Father        lung, prostate  . Hyperlipidemia Father   . Hypertension Father    Social History   Social History  . Marital status: Married    Spouse name: N/A  . Number of children: 2  . Years of education: N/A   Social History Main Topics  . Smoking status: Never Smoker  . Smokeless tobacco: Never Used  . Alcohol use No  . Drug use: No  . Sexual activity: Not Currently   Other Topics Concern  . None   Social History Narrative  . None    Outpatient  Encounter Prescriptions as of 10/01/2017  Medication Sig  . acetaminophen (TYLENOL) 500 MG tablet Take 500 mg by mouth every 6 (six) hours as needed.  Marland Kitchen aspirin 325 MG EC tablet Take 325 mg by mouth daily.  Marland Kitchen atorvastatin (LIPITOR) 10 MG tablet Take 1 tablet (10 mg total) by mouth daily.  . Calcium Carbonate-Vitamin D (CALCIUM 600+D) 600-200 MG-UNIT TABS Take by mouth 2 (two) times daily.  . cetirizine (ZYRTEC) 10 MG tablet Take 1 tablet (10 mg total) by mouth daily.  . Coenzyme Q10 (CO Q 10 PO) Take by mouth 2 (two) times daily.  . Hypertonic Nasal Wash (SINUS RINSE NA) Place into the nose daily.  Marland Kitchen ibuprofen (ADVIL,MOTRIN) 200 MG tablet Take 200 mg by mouth every 6 (six) hours as needed.   Marland Kitchen losartan-hydrochlorothiazide (HYZAAR) 100-12.5 MG tablet Take 1 tablet by mouth daily.  . Omega-3 Fatty Acids (FISH OIL) 1000 MG CAPS Take by mouth 2 (two) times daily.  . Probiotic Product (ALIGN PO) Take by mouth as needed.  . triamcinolone (NASACORT) 55 MCG/ACT nasal inhaler Place 2 sprays into the nose as needed.  . doxycycline (VIBRA-TABS) 100 MG tablet Take 1 tablet (100 mg total) by mouth 2 (two) times daily.   No facility-administered encounter medications on file as of 10/01/2017.     Review of Systems  Constitutional: Negative for appetite change and unexpected weight change.  HENT: Positive for congestion, postnasal drip, sinus pressure and sore throat.   Respiratory: Positive for cough. Negative for chest tightness and shortness of breath.   Cardiovascular: Negative for chest pain, palpitations and leg swelling.  Gastrointestinal: Negative for diarrhea, nausea and vomiting.  Skin: Negative for color change and rash.  Neurological: Negative for dizziness, light-headedness and headaches.       Objective:    Physical Exam  Constitutional: She appears well-developed and well-nourished. No distress.  HENT:  Mouth/Throat: Oropharynx is clear and moist.  TMs without erythema.  Nares -  slightly erythematous turbinates.  Minimal tenderness to palpation over the sinuses.    Neck: Neck supple.  Cardiovascular: Normal rate and regular rhythm.   Pulmonary/Chest: Breath sounds normal. No respiratory distress. She has no wheezes.  Lymphadenopathy:    She has no cervical adenopathy.  Skin: No rash noted.    BP 130/82 (BP Location: Left Arm, Patient Position: Sitting, Cuff Size: Normal)   Pulse 78   Temp 98.6 F (37 C) (Oral)   Resp 14   Wt 117 lb 3.2 oz (53.2 kg)   LMP 11/28/1998   SpO2 96%   BMI 21.61 kg/m  Wt Readings from Last 3 Encounters:  10/01/17 117 lb 3.2 oz (53.2 kg)  09/30/17 118 lb 6.4 oz (53.7 kg)  09/27/17 119 lb (54 kg)     Lab Results  Component Value Date   WBC 8.2 06/14/2017   HGB 13.4 06/14/2017   HCT 39.7 06/14/2017   PLT 231.0 06/14/2017   GLUCOSE 89 06/07/2017   CHOL 193 06/07/2017   TRIG 117.0 06/07/2017   HDL 59.80 06/07/2017   LDLDIRECT 103.0 05/21/2015   LDLCALC 110 (H) 06/07/2017   ALT 18 06/07/2017   AST 17 06/07/2017   NA 140 06/07/2017   K 3.7 06/07/2017   CL 105 06/07/2017   CREATININE 0.92 06/07/2017   BUN 22 06/07/2017   CO2 30 06/07/2017   TSH 1.41 06/14/2017   INR 1.00 11/25/2015    US Venous Img Lower Unilateral Left  Result Date: 09/03/2017 CLINICAL DATA:  Left leg swelling EXAM: LEFT LOWER EXTREMITY VENOUS DOPPLER ULTRASOUND TECHNIQUE: Gray-scale sonography with graded compression, as well as color Doppler and duplex ultrasound were performed to evaluate the lower extremity deep venous systems from the level of the common femoral vein and including the common femoral, femoral, profunda femoral, popliteal and calf veins including the posterior tibial, peroneal and gastrocnemius veins when visible. The superficial great saphenous vein was also interrogated. Spectral Doppler was utilized to evaluate flow at rest and with distal augmentation maneuvers in the common femoral, femoral and popliteal veins. COMPARISON:  None.  FINDINGS: Contralateral Common Femoral Vein: Respiratory phasicity is normal and symmetric with the symptomatic side. No evidence of thrombus. Normal compressibility. Common Femoral Vein: No evidence of thrombus. Normal compressibility, respiratory phasicity and response to augmentation. Saphenofemoral Junction: No evidence of thrombus. Normal compressibility and flow on color Doppler imaging. Profunda Femoral Vein: No evidence of thrombus. Normal compressibility and flow on color Doppler imaging. Femoral Vein: No evidence of thrombus. Normal compressibility, respiratory phasicity and response to augmentation. Popliteal Vein: No evidence of thrombus. Normal compressibility, respiratory phasicity and response to augmentation. Calf Veins: No evidence of thrombus. Normal compressibility and flow on color Doppler imaging. Superficial Great Saphenous Vein: No evidence of thrombus. Normal compressibility and flow on color Doppler imaging. Venous Reflux:  None. Other Findings:  4.3 x 2.7  x 4.2 cm cyst in the popliteal fossa IMPRESSION: No evidence of DVT within the left lower extremity. Left Baker's cyst. Electronically Signed   By: Rolm Baptise M.D.   On: 09/03/2017 09:48       Assessment & Plan:   Problem List Items Addressed This Visit    Sinusitis    Symptoms c/w sinus infection.  Treat with doxycycline as directed.  Continue nasacort nasal spray and saline nasal spray.  Robitussin DM as directed.  Follow.        Relevant Medications   doxycycline (VIBRA-TABS) 100 MG tablet       Einar Pheasant, MD

## 2017-10-03 ENCOUNTER — Encounter: Payer: Self-pay | Admitting: Internal Medicine

## 2017-10-03 DIAGNOSIS — J329 Chronic sinusitis, unspecified: Secondary | ICD-10-CM | POA: Insufficient documentation

## 2017-10-03 NOTE — Assessment & Plan Note (Signed)
Symptoms c/w sinus infection.  Treat with doxycycline as directed.  Continue nasacort nasal spray and saline nasal spray.  Robitussin DM as directed.  Follow.

## 2017-10-11 ENCOUNTER — Other Ambulatory Visit: Payer: Self-pay | Admitting: Internal Medicine

## 2017-10-20 DIAGNOSIS — Z23 Encounter for immunization: Secondary | ICD-10-CM | POA: Diagnosis not present

## 2017-11-02 ENCOUNTER — Other Ambulatory Visit (INDEPENDENT_AMBULATORY_CARE_PROVIDER_SITE_OTHER): Payer: Medicare Other

## 2017-11-02 DIAGNOSIS — I10 Essential (primary) hypertension: Secondary | ICD-10-CM | POA: Diagnosis not present

## 2017-11-02 DIAGNOSIS — E7800 Pure hypercholesterolemia, unspecified: Secondary | ICD-10-CM | POA: Diagnosis not present

## 2017-11-02 LAB — LIPID PANEL
CHOLESTEROL: 211 mg/dL — AB (ref 0–200)
HDL: 67.9 mg/dL (ref >39.00)
LDL Cholesterol: 112 mg/dL — ABNORMAL HIGH (ref 0–99)
NonHDL: 143.14
TRIGLYCERIDES: 157 mg/dL — AB (ref 0.0–149.0)
Total CHOL/HDL Ratio: 3
VLDL: 31.4 mg/dL (ref 0.0–40.0)

## 2017-11-02 LAB — BASIC METABOLIC PANEL
BUN: 17 mg/dL (ref 6–23)
CALCIUM: 10.3 mg/dL (ref 8.4–10.5)
CHLORIDE: 103 meq/L (ref 96–112)
CO2: 33 mEq/L — ABNORMAL HIGH (ref 19–32)
CREATININE: 0.82 mg/dL (ref 0.40–1.20)
GFR: 72.88 mL/min (ref >60.00)
Glucose, Bld: 90 mg/dL (ref 70–99)
Potassium: 3.7 mEq/L (ref 3.5–5.1)
Sodium: 140 mEq/L (ref 135–145)

## 2017-11-02 LAB — HEPATIC FUNCTION PANEL
ALT: 19 U/L (ref 0–35)
AST: 20 U/L (ref 0–37)
Albumin: 4.1 g/dL (ref 3.5–5.2)
Alkaline Phosphatase: 64 U/L (ref 39–117)
BILIRUBIN DIRECT: 0.1 mg/dL (ref 0.0–0.3)
BILIRUBIN TOTAL: 0.5 mg/dL (ref 0.2–1.2)
TOTAL PROTEIN: 7 g/dL (ref 6.0–8.3)

## 2017-11-04 ENCOUNTER — Ambulatory Visit (INDEPENDENT_AMBULATORY_CARE_PROVIDER_SITE_OTHER): Payer: Medicare Other | Admitting: Internal Medicine

## 2017-11-04 ENCOUNTER — Encounter: Payer: Self-pay | Admitting: Internal Medicine

## 2017-11-04 DIAGNOSIS — Z9109 Other allergy status, other than to drugs and biological substances: Secondary | ICD-10-CM | POA: Diagnosis not present

## 2017-11-04 DIAGNOSIS — R7989 Other specified abnormal findings of blood chemistry: Secondary | ICD-10-CM

## 2017-11-04 DIAGNOSIS — E7800 Pure hypercholesterolemia, unspecified: Secondary | ICD-10-CM

## 2017-11-04 DIAGNOSIS — R945 Abnormal results of liver function studies: Secondary | ICD-10-CM

## 2017-11-04 DIAGNOSIS — I10 Essential (primary) hypertension: Secondary | ICD-10-CM | POA: Diagnosis not present

## 2017-11-04 MED ORDER — ATORVASTATIN CALCIUM 20 MG PO TABS: 20.0000 mg | ORAL_TABLET | Freq: Every day | ORAL | 2 refills | Status: DC

## 2017-11-04 NOTE — Progress Notes (Signed)
Patient ID: Jamie Elliott, female   DOB: 1946/09/20, 71 y.o.   MRN: [id number]   Subjective:    Patient ID: Jamie Elliott, female    DOB: 1946-03-06, 71 y.o.   MRN: [id number]  HPI  Patient here for a scheduled follow up.   She reports she is feeling better.  Previous sinus symptoms resolved.  No increased cough or congestion.  No sob.  No acid reflux.  No abdominal pain.  Bowels moving.  Some pain left popliteal.  Not as bad.  Had previous ultrasound - Bakers cyst.  Discussed further w/up or evaluation.  Discussed referral to ortho.  She declines.  Wants to follow.  Saw cardiology recently.  Stress test negative.  Discussed labs.  Discussed increasing lipitor.    Past Medical History:  Diagnosis Date  . Environmental allergies   . Hypercholesterolemia   . Hypertension   . Migraines    Past Surgical History:  Procedure Laterality Date  . ABDOMINAL HYSTERECTOMY  2000  . APPENDECTOMY  2000  . NOSE SURGERY  1967  . SEPTOPLASTY    . SHOULDER ARTHROSCOPY WITH MINI OPEN ROTATOR CUFF REPAIR. DISTAL CLAVICLE EXCISION Right 12/05/2015   Performed by Thornton Park, MD at [facility] ORS   Family History  Problem Relation Age of Onset  . Heart disease Mother   . Hyperlipidemia Mother   . Hypertension Mother   . Cancer Father        lung, prostate  . Hyperlipidemia Father   . Hypertension Father    Social History   Socioeconomic History  . Marital status: Married    Spouse name: None  . Number of children: 2  . Years of education: None  . Highest education level: None  Social Needs  . Financial resource strain: None  . Food insecurity - worry: None  . Food insecurity - inability: None  . Transportation needs - medical: None  . Transportation needs - non-medical: None  Occupational History  . None  Tobacco Use  . Smoking status: Never Smoker  . Smokeless tobacco: Never Used  Substance and Sexual Activity  . Alcohol use: No    Alcohol/week: 0.0 oz  . Drug use: No  . Sexual  activity: Not Currently  Other Topics Concern  . None  Social History Narrative  . None    Outpatient Encounter Medications as of 11/04/2017  Medication Sig  . acetaminophen (TYLENOL) 500 MG tablet Take 500 mg by mouth every 6 (six) hours as needed.  Marland Kitchen aspirin 325 MG EC tablet Take 325 mg by mouth daily.  . Calcium Carbonate-Vitamin D (CALCIUM 600+D) 600-200 MG-UNIT TABS Take by mouth 2 (two) times daily.  . cetirizine (ZYRTEC) 10 MG tablet Take 1 tablet (10 mg total) by mouth daily.  . Coenzyme Q10 (CO Q 10 PO) Take by mouth 2 (two) times daily.  Marland Kitchen doxycycline (VIBRA-TABS) 100 MG tablet Take 1 tablet (100 mg total) by mouth 2 (two) times daily.  . Hypertonic Nasal Wash (SINUS RINSE NA) Place into the nose daily.  Marland Kitchen ibuprofen (ADVIL,MOTRIN) 200 MG tablet Take 200 mg by mouth every 6 (six) hours as needed.   Marland Kitchen losartan-hydrochlorothiazide (HYZAAR) 100-12.5 MG tablet Take 1 tablet by mouth daily.  . Omega-3 Fatty Acids (FISH OIL) 1000 MG CAPS Take by mouth 2 (two) times daily.  . Probiotic Product (ALIGN PO) Take by mouth as needed.  . triamcinolone (NASACORT) 55 MCG/ACT nasal inhaler Place 2 sprays into the nose as needed.  . [  DISCONTINUED] atorvastatin (LIPITOR) 10 MG tablet Take 1 tablet (10 mg total) by mouth daily.  Marland Kitchen atorvastatin (LIPITOR) 20 MG tablet Take 1 tablet (20 mg total) daily by mouth.   No facility-administered encounter medications on file as of 11/04/2017.     Review of Systems  Constitutional: Negative for appetite change and unexpected weight change.  HENT: Negative for congestion and sinus pressure.   Respiratory: Negative for cough, chest tightness and shortness of breath.   Cardiovascular: Negative for chest pain, palpitations and leg swelling.  Gastrointestinal: Negative for abdominal pain, diarrhea, nausea and vomiting.  Genitourinary: Negative for difficulty urinating and dysuria.  Musculoskeletal: Negative for back pain and joint swelling.  Skin: Negative  for color change and rash.  Neurological: Negative for dizziness, light-headedness and headaches.  Psychiatric/Behavioral: Negative for agitation and dysphoric mood.       Objective:    Physical Exam  Constitutional: She appears well-developed and well-nourished. No distress.  HENT:  Nose: Nose normal.  Mouth/Throat: Oropharynx is clear and moist.  Neck: Neck supple. No thyromegaly present.  Cardiovascular: Normal rate and regular rhythm.  Pulmonary/Chest: Breath sounds normal. No respiratory distress. She has no wheezes.  Abdominal: Soft. Bowel sounds are normal. There is no tenderness.  Musculoskeletal: She exhibits no edema or tenderness.  Lymphadenopathy:    She has no cervical adenopathy.  Skin: No rash noted. No erythema.  Psychiatric: She has a normal mood and affect. Her behavior is normal.    BP (!) 148/80 (BP Location: Left Arm, Patient Position: Sitting, Cuff Size: Normal)   Pulse 72   Temp 98 F (36.7 C) (Oral)   Resp 17   Ht 5' 1.81" (1.57 m)   Wt 119 lb (54 kg)   LMP 11/28/1998   SpO2 95%   BMI 21.90 kg/m  Wt Readings from Last 3 Encounters:  11/04/17 119 lb (54 kg)  10/01/17 117 lb 3.2 oz (53.2 kg)  09/30/17 118 lb 6.4 oz (53.7 kg)     Lab Results  Component Value Date   WBC 8.2 06/14/2017   HGB 13.4 06/14/2017   HCT 39.7 06/14/2017   PLT 231.0 06/14/2017   GLUCOSE 90 11/02/2017   CHOL 211 (H) 11/02/2017   TRIG 157.0 (H) 11/02/2017   HDL 67.90 11/02/2017   LDLDIRECT 103.0 05/21/2015   LDLCALC 112 (H) 11/02/2017   ALT 19 11/02/2017   AST 20 11/02/2017   NA 140 11/02/2017   K 3.7 11/02/2017   CL 103 11/02/2017   CREATININE 0.82 11/02/2017   BUN 17 11/02/2017   CO2 33 (H) 11/02/2017   TSH 1.41 06/14/2017   INR 1.00 11/25/2015    US Venous Img Lower Unilateral Left  Result Date: 09/03/2017 CLINICAL DATA:  Left leg swelling EXAM: LEFT LOWER EXTREMITY VENOUS DOPPLER ULTRASOUND TECHNIQUE: Gray-scale sonography with graded compression, as  well as color Doppler and duplex ultrasound were performed to evaluate the lower extremity deep venous systems from the level of the common femoral vein and including the common femoral, femoral, profunda femoral, popliteal and calf veins including the posterior tibial, peroneal and gastrocnemius veins when visible. The superficial great saphenous vein was also interrogated. Spectral Doppler was utilized to evaluate flow at rest and with distal augmentation maneuvers in the common femoral, femoral and popliteal veins. COMPARISON:  None. FINDINGS: Contralateral Common Femoral Vein: Respiratory phasicity is normal and symmetric with the symptomatic side. No evidence of thrombus. Normal compressibility. Common Femoral Vein: No evidence of thrombus. Normal compressibility, respiratory phasicity and  response to augmentation. Saphenofemoral Junction: No evidence of thrombus. Normal compressibility and flow on color Doppler imaging. Profunda Femoral Vein: No evidence of thrombus. Normal compressibility and flow on color Doppler imaging. Femoral Vein: No evidence of thrombus. Normal compressibility, respiratory phasicity and response to augmentation. Popliteal Vein: No evidence of thrombus. Normal compressibility, respiratory phasicity and response to augmentation. Calf Veins: No evidence of thrombus. Normal compressibility and flow on color Doppler imaging. Superficial Great Saphenous Vein: No evidence of thrombus. Normal compressibility and flow on color Doppler imaging. Venous Reflux:  None. Other Findings:  4.3 x 2.7 x 4.2 cm cyst in the popliteal fossa IMPRESSION: No evidence of DVT within the left lower extremity. Left Baker's cyst. Electronically Signed   By: Rolm Baptise M.D.   On: 09/03/2017 09:48       Assessment & Plan:   Problem List Items Addressed This Visit    Abnormal liver function tests    Recheck liver panel.        Environmental allergies    Appears to be doing better.  Follow.         Hypercholesterolemia    Discussed lab results.  Discussed diet and exercise.  Increase lipitor to 20mg  q day.  Follow lipid panel and liver function tests.        Relevant Medications   atorvastatin (LIPITOR) 20 MG tablet   Hypertension    Blood pressure slightly elevated today.  Have her spot check her pressure.  Now that she is feeling better, she plans to get back in her routine.  Exercise.  Discussed diet.  Decreased sodium intake.  Has been eating a lot of potato chips.  Hold on changing medication.  Follow.        Relevant Medications   atorvastatin (LIPITOR) 20 MG tablet       Einar Pheasant, MD

## 2017-11-07 ENCOUNTER — Encounter: Payer: Self-pay | Admitting: Internal Medicine

## 2017-11-07 NOTE — Assessment & Plan Note (Signed)
Appears to be doing better. Follow.  

## 2017-11-07 NOTE — Assessment & Plan Note (Signed)
Discussed lab results.  Discussed diet and exercise.  Increase lipitor to 20mg  q day.  Follow lipid panel and liver function tests.

## 2017-11-07 NOTE — Assessment & Plan Note (Signed)
Blood pressure slightly elevated today.  Have her spot check her pressure.  Now that she is feeling better, she plans to get back in her routine.  Exercise.  Discussed diet.  Decreased sodium intake.  Has been eating a lot of potato chips.  Hold on changing medication.  Follow.

## 2017-11-07 NOTE — Assessment & Plan Note (Signed)
Recheck liver panel.  

## 2017-11-24 DIAGNOSIS — N390 Urinary tract infection, site not specified: Secondary | ICD-10-CM | POA: Diagnosis not present

## 2017-11-24 DIAGNOSIS — R300 Dysuria: Secondary | ICD-10-CM | POA: Diagnosis not present

## 2017-11-30 ENCOUNTER — Other Ambulatory Visit: Payer: Self-pay | Admitting: Internal Medicine

## 2017-12-01 DIAGNOSIS — R300 Dysuria: Secondary | ICD-10-CM | POA: Diagnosis not present

## 2017-12-04 DIAGNOSIS — R399 Unspecified symptoms and signs involving the genitourinary system: Secondary | ICD-10-CM | POA: Diagnosis not present

## 2018-01-07 ENCOUNTER — Other Ambulatory Visit: Payer: Self-pay | Admitting: Internal Medicine

## 2018-01-11 ENCOUNTER — Ambulatory Visit
Admission: RE | Admit: 2018-01-11 | Discharge: 2018-01-11 | Disposition: A | Discharge status: Home / Self Care | Payer: Medicare Other | Source: Ambulatory Visit | Attending: Urology | Admitting: Urology

## 2018-01-11 ENCOUNTER — Ambulatory Visit (INDEPENDENT_AMBULATORY_CARE_PROVIDER_SITE_OTHER): Payer: Medicare Other | Admitting: Urology

## 2018-01-11 ENCOUNTER — Encounter: Payer: Self-pay | Admitting: Urology

## 2018-01-11 VITALS — BP 171/84 | HR 81 | Ht 61.8 in | Wt 120.8 lb

## 2018-01-11 DIAGNOSIS — Z87440 Personal history of urinary (tract) infections: Secondary | ICD-10-CM | POA: Diagnosis not present

## 2018-01-11 DIAGNOSIS — N390 Urinary tract infection, site not specified: Secondary | ICD-10-CM

## 2018-01-11 DIAGNOSIS — M545 Low back pain, unspecified: Secondary | ICD-10-CM

## 2018-01-11 DIAGNOSIS — M5136 Other intervertebral disc degeneration, lumbar region: Secondary | ICD-10-CM | POA: Diagnosis not present

## 2018-01-11 DIAGNOSIS — M5386 Other specified dorsopathies, lumbar region: Secondary | ICD-10-CM | POA: Insufficient documentation

## 2018-01-11 LAB — URINALYSIS, COMPLETE
Bilirubin, UA: NEGATIVE
Glucose, UA: NEGATIVE
Ketones, UA: NEGATIVE
LEUKOCYTES UA: NEGATIVE
Nitrite, UA: NEGATIVE
PROTEIN UA: NEGATIVE
Specific Gravity, UA: 1.015 (ref 1.005–1.030)
Urobilinogen, Ur: 0.2 mg/dL (ref 0.2–1.0)
pH, UA: 7 (ref 5.0–7.5)

## 2018-01-11 LAB — MICROSCOPIC EXAMINATION: BACTERIA UA: NONE SEEN

## 2018-01-11 NOTE — Progress Notes (Signed)
01/11/2018 1:15 PM   Jamie Elliott 1945/12/29 [id number]  Referring provider: Maryland Pink, MD [address] [facility] [place], [place] [address]  Chief Complaint  Patient presents with  . Hematuria    HPI: 72 year old female who presents today for further evaluation of possible urinary tract infections.  She has been seen multiple times at [facility] urgent care walk-in with complaints of lower back pain, bladder pressure and frequency including 3 independent occations in December on December 5, December 12, and December 15.  Urine was relatively bland on all occasions other than on 12/5 which showed 10-50 white blood cells with rare bacteria.  UCx negative x 2.   She did have a follow-up urine culture the following visit which was negative.   She was treated each time with a abx for presumed infection.   Lower back pain was a new symptom and not normally associated with UTIs.  She currently the symptoms were not consistent with her previous UTI symptoms she never previously had lower.  She has had no dysuria.  She did have lower abdominal pressure, and frequency.  Today, she is asymptomatic.    She did grow E. coli on 04/15/2017 at which time her urine was frankly positive for infection.  She denies a personal history of kidney stones.  She is status post abdominal hysterectomy.  She is postmenopausal.  She is sexually active.  Occasional vaginal dryness.     PMH: Past Medical History:  Diagnosis Date  . Environmental allergies   . Hypercholesterolemia   . Hypertension   . Migraines     Surgical History: Past Surgical History:  Procedure Laterality Date  . ABDOMINAL HYSTERECTOMY  2000  . APPENDECTOMY  2000  . NOSE SURGERY  1967  . SEPTOPLASTY    . SHOULDER ARTHROSCOPY WITH OPEN ROTATOR CUFF REPAIR Right 12/05/2015   Procedure: SHOULDER ARTHROSCOPY WITH MINI OPEN ROTATOR CUFF REPAIR. DISTAL CLAVICLE EXCISION;  Surgeon: Thornton Park, MD;  Location: [facility]  ORS;  Service: Orthopedics;  Laterality: Right;    Home Medications:  Allergies as of 01/11/2018      Reactions   Ciprofloxacin Other (See Comments)   Patient reports "it doesn't work for me". Last culture was sensitive, however.    Zithromax [azithromycin]    Augmentin [amoxicillin-pot Clavulanate] Rash      Medication List        Accurate as of 01/11/18  1:15 PM. Always use your most recent med list.          acetaminophen 500 MG tablet Commonly known as:  TYLENOL Take 500 mg by mouth every 6 (six) hours as needed.   ALIGN PO Take by mouth as needed.   aspirin 325 MG EC tablet Take 325 mg by mouth daily.   atorvastatin 20 MG tablet Commonly known as:  LIPITOR Take 1 tablet (20 mg total) daily by mouth.   CALCIUM 600+D 600-200 MG-UNIT Tabs Generic drug:  Calcium Carbonate-Vitamin D Take by mouth 2 (two) times daily.   cetirizine 10 MG tablet Commonly known as:  ZYRTEC Take 1 tablet (10 mg total) by mouth daily.   CO Q 10 PO Take by mouth 2 (two) times daily.   doxycycline 100 MG tablet Commonly known as:  VIBRA-TABS Take 1 tablet (100 mg total) by mouth 2 (two) times daily.   Fish Oil 1000 MG Caps Take by mouth 2 (two) times daily.   ibuprofen 200 MG tablet Commonly known as:  ADVIL,MOTRIN Take 200 mg  by mouth every 6 (six) hours as needed.   losartan-hydrochlorothiazide 100-12.5 MG tablet Commonly known as:  HYZAAR Take 1 tablet by mouth daily.   SINUS RINSE NA Place into the nose daily.   triamcinolone 55 MCG/ACT nasal inhaler Commonly known as:  NASACORT Place 2 sprays into the nose as needed.       Allergies:  Allergies  Allergen Reactions  . Ciprofloxacin Other (See Comments)    Patient reports "it doesn't work for me". Last culture was sensitive, however.   . Zithromax [Azithromycin]   . Augmentin [Amoxicillin-Pot Clavulanate] Rash    Family History: Family History  Problem Relation Age of Onset  . Heart disease Mother   .  Hyperlipidemia Mother   . Hypertension Mother   . Cancer Father        lung, prostate  . Hyperlipidemia Father   . Hypertension Father   . Prostate cancer Father   . Bladder Cancer Neg Hx   . Kidney cancer Neg Hx     Social History:  reports that  has never smoked. she has never used smokeless tobacco. She reports that she does not drink alcohol or use drugs.  ROS: UROLOGY Frequent Urination?: Yes Hard to postpone urination?: Yes Burning/pain with urination?: No Get up at night to urinate?: No Leakage of urine?: No Urine stream starts and stops?: Yes Trouble starting stream?: No Do you have to strain to urinate?: No Blood in urine?: No Urinary tract infection?: Yes Sexually transmitted disease?: No Injury to kidneys or bladder?: No Painful intercourse?: No Weak stream?: No Currently pregnant?: No Vaginal bleeding?: No Last menstrual period?: n  Gastrointestinal Nausea?: No Vomiting?: No Indigestion/heartburn?: No Diarrhea?: No Constipation?: No  Constitutional Fever: No Night sweats?: No Weight loss?: No Fatigue?: No  Skin Skin rash/lesions?: No Itching?: No  Eyes Blurred vision?: No Double vision?: No  Ears/Nose/Throat Sore throat?: No Sinus problems?: No  Hematologic/Lymphatic Swollen glands?: No Easy bruising?: No  Cardiovascular Leg swelling?: No Chest pain?: No  Respiratory Cough?: No Shortness of breath?: No  Endocrine Excessive thirst?: No  Musculoskeletal Back pain?: Yes Joint pain?: No  Neurological Headaches?: No Dizziness?: No  Psychologic Depression?: No Anxiety?: No  Physical Exam: BP (!) 171/84 (BP Location: Left Arm, Patient Position: Sitting, Cuff Size: Normal)   Pulse 81   Ht 5' 1.8" (1.57 m)   Wt 120 lb 12.8 oz (54.8 kg)   LMP 11/28/1998   BMI 22.24 kg/m   Constitutional:  Alert and oriented, No acute distress. HEENT: [place] AT, moist mucus membranes.  Trachea midline, no masses. Cardiovascular: No clubbing,  cyanosis, or edema. Respiratory: Normal respiratory effort, no increased work of breathing. GI: Abdomen is soft, nontender, nondistended, no abdominal masses GU: No CVA tenderness.  Skin: No rashes, bruises or suspicious lesions. Neurologic: Grossly intact, no focal deficits, moving all 4 extremities. Psychiatric: Normal mood and affect.  Laboratory Data: Lab Results  Component Value Date   WBC 8.2 06/14/2017   HGB 13.4 06/14/2017   HCT 39.7 06/14/2017   MCV 92.2 06/14/2017   PLT 231.0 06/14/2017    Lab Results  Component Value Date   CREATININE 0.82 11/02/2017    Urinalysis Results for orders placed or performed in visit on 01/11/18  Microscopic Examination  Result Value Ref Range   WBC, UA 0-5 0 - 5 /hpf   RBC, UA 0-2 0 - 2 /hpf   Epithelial Cells (non renal) 0-10 0 - 10 /hpf   Bacteria, UA None seen None  seen/Few  Urinalysis, Complete  Result Value Ref Range   Specific Gravity, UA 1.015 1.005 - 1.030   pH, UA 7.0 5.0 - 7.5   Color, UA Yellow Yellow   Appearance Ur Cloudy (A) Clear   Leukocytes, UA Negative Negative   Protein, UA Negative Negative/Trace   Glucose, UA Negative Negative   Ketones, UA Negative Negative   RBC, UA Trace (A) Negative   Bilirubin, UA Negative Negative   Urobilinogen, Ur 0.2 0.2 - 1.0 mg/dL   Nitrite, UA Negative Negative   Microscopic Examination See below:     Pertinent Imaging: N/a  Multiple previous UA/urine culture reviewed the epic in my prior chart past year.  No upper tract imaging for review.  Assessment & Plan:    1. Recurrent UTI Based on negative urine culture data and symptoms unlike her previous UTIs, I am suspicious whether or not all of her issues in December related to true UTIs We will obtain KUB to rule out stones given low back pain Reviewed general hygiene issues, probiotic use, and cranberry tabs twice daily as prevention techniques Cystoscopy to evaluate bladder in the setting of unusual symptoms and  negative urine culture Pelvic exam at the time of cystoscopy-may benefit from topical estrogen cream due to vaginal dryness and possible recurrent UTIs Additionally, I have encouraged her to follow-up in our office specifically during the week with any UTI type symptoms for UA/urine culture and triage - Urinalysis, Complete - DG Abd 1 View; Future  2. Low back pain without sciatica, unspecified back pain laterality, unspecified chronicity As above, etiology somewhat unclear and unlikely related to bladder  Return in about 2 weeks (around 01/25/2018) for cysto/ pelvic exam.  Hollice Espy, MD  [facility] [address], [place], [place] [address] [phone]

## 2018-01-12 ENCOUNTER — Telehealth: Payer: Self-pay

## 2018-01-12 NOTE — Telephone Encounter (Signed)
-----   Message from Hollice Espy, MD sent at 01/11/2018 12:24 PM EST ----- No obvious stones.  There is evidence of constipation.    Hollice Espy, MD

## 2018-01-12 NOTE — Telephone Encounter (Signed)
Letter sent.

## 2018-01-14 ENCOUNTER — Encounter: Payer: Self-pay | Admitting: Internal Medicine

## 2018-01-14 ENCOUNTER — Ambulatory Visit (INDEPENDENT_AMBULATORY_CARE_PROVIDER_SITE_OTHER): Payer: Medicare Other | Admitting: Internal Medicine

## 2018-01-14 DIAGNOSIS — I10 Essential (primary) hypertension: Secondary | ICD-10-CM

## 2018-01-14 DIAGNOSIS — E7800 Pure hypercholesterolemia, unspecified: Secondary | ICD-10-CM | POA: Diagnosis not present

## 2018-01-14 DIAGNOSIS — N390 Urinary tract infection, site not specified: Secondary | ICD-10-CM | POA: Diagnosis not present

## 2018-01-14 DIAGNOSIS — M810 Age-related osteoporosis without current pathological fracture: Secondary | ICD-10-CM

## 2018-01-14 DIAGNOSIS — R945 Abnormal results of liver function studies: Secondary | ICD-10-CM | POA: Diagnosis not present

## 2018-01-14 DIAGNOSIS — R7989 Other specified abnormal findings of blood chemistry: Secondary | ICD-10-CM

## 2018-01-14 MED ORDER — AMLODIPINE BESYLATE 2.5 MG PO TABS: 2.5000 mg | ORAL_TABLET | Freq: Every day | ORAL | 2 refills | Status: DC

## 2018-01-14 NOTE — Progress Notes (Signed)
Patient ID: Jamie Elliott, female   DOB: November 25, 1946, 72 y.o.   MRN: [id number]   Subjective:    Patient ID: Jamie Elliott, female    DOB: 1946/07/14, 72 y.o.   MRN: [id number]  HPI  Patient here for a scheduled follow up.  She reports she is doing better.  Feels better.  Previous urinary issues.  Seeing urology.  Just evaluated 01/11/18.  Planning for cystoscopy.  Taking cranberry tablets.  Stays active.  Blood pressure elevated.  No chest pain.  No sob.  No acid reflux.  No abdominal pain.  Bowels moving.     Past Medical History:  Diagnosis Date  . Environmental allergies   . Hypercholesterolemia   . Hypertension   . Migraines    Past Surgical History:  Procedure Laterality Date  . ABDOMINAL HYSTERECTOMY  2000  . APPENDECTOMY  2000  . NOSE SURGERY  1967  . SEPTOPLASTY    . SHOULDER ARTHROSCOPY WITH OPEN ROTATOR CUFF REPAIR Right 12/05/2015   Procedure: SHOULDER ARTHROSCOPY WITH MINI OPEN ROTATOR CUFF REPAIR. DISTAL CLAVICLE EXCISION;  Surgeon: Thornton Park, MD;  Location: [facility] ORS;  Service: Orthopedics;  Laterality: Right;   Family History  Problem Relation Age of Onset  . Heart disease Mother   . Hyperlipidemia Mother   . Hypertension Mother   . Cancer Father        lung, prostate  . Hyperlipidemia Father   . Hypertension Father   . Prostate cancer Father   . Bladder Cancer Neg Hx   . Kidney cancer Neg Hx    Social History   Socioeconomic History  . Marital status: Married    Spouse name: None  . Number of children: 2  . Years of education: None  . Highest education level: None  Social Needs  . Financial resource strain: None  . Food insecurity - worry: None  . Food insecurity - inability: None  . Transportation needs - medical: None  . Transportation needs - non-medical: None  Occupational History  . None  Tobacco Use  . Smoking status: Never Smoker  . Smokeless tobacco: Never Used  Substance and Sexual Activity  . Alcohol use: No    Alcohol/week: 0.0  oz  . Drug use: No  . Sexual activity: Not Currently  Other Topics Concern  . None  Social History Narrative  . None    Outpatient Encounter Medications as of 01/14/2018  Medication Sig  . acetaminophen (TYLENOL) 500 MG tablet Take 500 mg by mouth every 6 (six) hours as needed.  Marland Kitchen aspirin 325 MG EC tablet Take 325 mg by mouth daily.  Marland Kitchen atorvastatin (LIPITOR) 20 MG tablet Take 1 tablet (20 mg total) daily by mouth.  . Calcium Carbonate-Vitamin D (CALCIUM 600+D) 600-200 MG-UNIT TABS Take by mouth 2 (two) times daily.  . cetirizine (ZYRTEC) 10 MG tablet Take 1 tablet (10 mg total) by mouth daily.  . Coenzyme Q10 (CO Q 10 PO) Take by mouth 2 (two) times daily.  Marland Kitchen doxycycline (VIBRA-TABS) 100 MG tablet Take 1 tablet (100 mg total) by mouth 2 (two) times daily.  . Hypertonic Nasal Wash (SINUS RINSE NA) Place into the nose daily.  Marland Kitchen ibuprofen (ADVIL,MOTRIN) 200 MG tablet Take 200 mg by mouth every 6 (six) hours as needed.   Marland Kitchen losartan-hydrochlorothiazide (HYZAAR) 100-12.5 MG tablet Take 1 tablet by mouth daily.  . Omega-3 Fatty Acids (FISH OIL) 1000 MG CAPS Take by mouth 2 (two) times daily.  . Probiotic Product (ALIGN  PO) Take by mouth as needed.  . triamcinolone (NASACORT) 55 MCG/ACT nasal inhaler Place 2 sprays into the nose as needed.   No facility-administered encounter medications on file as of 01/14/2018.     Review of Systems  Constitutional: Negative for appetite change and unexpected weight change.  HENT: Negative for congestion and sinus pressure.   Respiratory: Negative for cough, chest tightness and shortness of breath.   Cardiovascular: Negative for chest pain, palpitations and leg swelling.  Gastrointestinal: Negative for abdominal pain, diarrhea, nausea and vomiting.  Genitourinary: Negative for difficulty urinating and dysuria.  Musculoskeletal: Negative for joint swelling and myalgias.  Skin: Negative for color change and rash.  Neurological: Negative for dizziness,  light-headedness and headaches.  Psychiatric/Behavioral: Negative for agitation and dysphoric mood.       Objective:    Physical Exam  Constitutional: She appears well-developed and well-nourished. No distress.  HENT:  Nose: Nose normal.  Mouth/Throat: Oropharynx is clear and moist.  Neck: Neck supple. No thyromegaly present.  Cardiovascular: Normal rate and regular rhythm.  Pulmonary/Chest: Breath sounds normal. No respiratory distress. She has no wheezes.  Abdominal: Soft. Bowel sounds are normal. There is no tenderness.  Musculoskeletal: She exhibits no edema or tenderness.  Lymphadenopathy:    She has no cervical adenopathy.  Skin: No rash noted. No erythema.  Psychiatric: She has a normal mood and affect. Her behavior is normal.    BP 138/82 (BP Location: Left Arm, Patient Position: Sitting, Cuff Size: Normal)   Pulse 80   Temp 98.5 F (36.9 C) (Oral)   Resp 16   Wt 120 lb 12.8 oz (54.8 kg)   LMP 11/28/1998   SpO2 98%   BMI 22.24 kg/m  Wt Readings from Last 3 Encounters:  01/14/18 120 lb 12.8 oz (54.8 kg)  01/11/18 120 lb 12.8 oz (54.8 kg)  11/04/17 119 lb (54 kg)     Lab Results  Component Value Date   WBC 8.2 06/14/2017   HGB 13.4 06/14/2017   HCT 39.7 06/14/2017   PLT 231.0 06/14/2017   GLUCOSE 90 11/02/2017   CHOL 211 (H) 11/02/2017   TRIG 157.0 (H) 11/02/2017   HDL 67.90 11/02/2017   LDLDIRECT 103.0 05/21/2015   LDLCALC 112 (H) 11/02/2017   ALT 19 11/02/2017   AST 20 11/02/2017   NA 140 11/02/2017   K 3.7 11/02/2017   CL 103 11/02/2017   CREATININE 0.82 11/02/2017   BUN 17 11/02/2017   CO2 33 (H) 11/02/2017   TSH 1.41 06/14/2017   INR 1.00 11/25/2015    Dg Abd 1 View  Result Date: 01/11/2018 CLINICAL DATA:  Low back pain and urinary tract infections for the past 2 months. No nausea, vomiting, or fever. EXAM: ABDOMEN - 1 VIEW COMPARISON:  None in PACs FINDINGS: The colonic stool burden is mildly increased. There no small or large bowel  obstructive pattern. There are no abnormal soft tissue calcifications. There are pelvic phleboliths. There is gentle levocurvature centered in the mid lumbar spine. Mild degenerative disc disease is noted in the upper and lower lumbar spine. IMPRESSION: Mildly increased colonic stool burden may reflect constipation in the appropriate clinical setting. No acute intra-abdominal abnormality is observed. Mild degenerative disc change with scoliosis of the lumbar spine. Electronically Signed   By: David  Martinique M.D.   On: 01/11/2018 11:31       Assessment & Plan:   Problem List Items Addressed This Visit    None  Einar Pheasant, MD

## 2018-01-16 ENCOUNTER — Encounter: Payer: Self-pay | Admitting: Internal Medicine

## 2018-01-16 NOTE — Assessment & Plan Note (Signed)
Blood pressure remains slightly elevated.  Add amlodipine 2.5mg  q day.  Follow pressures.  Follow metabolic panel.

## 2018-01-16 NOTE — Assessment & Plan Note (Signed)
Have discussed lab results.  Discussed diet and exercise.  On lipitor.  Follow lipid panel and liver function tests.   Lab Results  Component Value Date   CHOL 211 (H) 11/02/2017   HDL 67.90 11/02/2017   LDLCALC 112 (H) 11/02/2017   LDLDIRECT 103.0 05/21/2015   TRIG 157.0 (H) 11/02/2017   CHOLHDL 3 11/02/2017

## 2018-01-16 NOTE — Assessment & Plan Note (Signed)
Seeing urology.  Currently undergoing w/up.  Planning for cystoscopy.

## 2018-01-16 NOTE — Assessment & Plan Note (Signed)
Follow liver panel.  

## 2018-01-16 NOTE — Assessment & Plan Note (Signed)
Bone density - stable 07/2017.

## 2018-01-24 ENCOUNTER — Other Ambulatory Visit: Payer: Self-pay | Admitting: Internal Medicine

## 2018-02-02 ENCOUNTER — Ambulatory Visit: Payer: Medicare Other | Admitting: Urology

## 2018-02-02 ENCOUNTER — Encounter: Payer: Self-pay | Admitting: Urology

## 2018-02-02 VITALS — BP 134/66 | HR 80 | Ht 62.0 in | Wt 120.0 lb

## 2018-02-02 DIAGNOSIS — N390 Urinary tract infection, site not specified: Secondary | ICD-10-CM | POA: Diagnosis not present

## 2018-02-02 DIAGNOSIS — N952 Postmenopausal atrophic vaginitis: Secondary | ICD-10-CM

## 2018-02-02 DIAGNOSIS — K5909 Other constipation: Secondary | ICD-10-CM

## 2018-02-02 LAB — URINALYSIS, COMPLETE
Bilirubin, UA: NEGATIVE
Glucose, UA: NEGATIVE
Ketones, UA: NEGATIVE
Leukocytes, UA: NEGATIVE
Nitrite, UA: NEGATIVE
Protein, UA: NEGATIVE
Specific Gravity, UA: <1.005 — ABNORMAL LOW (ref 1.005–1.030)
Urobilinogen, Ur: 0.2 mg/dL (ref 0.2–1.0)
pH, UA: 6.5 (ref 5.0–7.5)

## 2018-02-02 MED ORDER — LIDOCAINE HCL 2 % EX GEL
1.0000 "application " | Freq: Once | CUTANEOUS | Status: AC
  Administered 2018-02-02: 1 via URETHRAL

## 2018-02-02 MED ORDER — ESTROGENS, CONJUGATED 0.625 MG/GM VA CREA: 1.0000 | TOPICAL_CREAM | Freq: Every day | VAGINAL | 12 refills | Status: DC

## 2018-02-02 MED ORDER — CIPROFLOXACIN HCL 500 MG PO TABS
500.0000 mg | ORAL_TABLET | Freq: Once | ORAL | Status: AC
  Administered 2018-02-02: 500 mg via ORAL

## 2018-02-02 NOTE — Progress Notes (Signed)
   02/02/18  CC:  Chief Complaint  Patient presents with  . Cysto    HPI: 72 year old female with recurrent urinary symptoms and occasional UTIs who returns the office today for cystoscopy, pelvic examination.  After last visit, she had a KUB which showed significant stool burden.  She has been taking MiraLAX every day and her urinary symptoms have improved dramatically.  She does complain of vaginal dryness at baseline.  Blood pressure 134/66, pulse 80, height 5\' 2"  (1.575 m), weight 120 lb (54.4 kg), last menstrual period 11/28/1998. NED. A&Ox3.   No respiratory distress   Abd soft, NT, ND Normal external genitalia with patent urethral meatus Atrophic vaginitis was appreciated without significant pelvic organ prolapse, fairly good vaginal support.  Cystoscopy Procedure Note  Patient identification was confirmed, informed consent was obtained, and patient was prepped using Betadine solution.  Lidocaine jelly was administered per urethral meatus.    Preoperative abx where received prior to procedure.    Procedure: - Flexible cystoscope introduced, without any difficulty.   - Thorough search of the bladder revealed:    normal urethral meatus    normal urothelium    no stones    no ulcers     no tumors    no urethral polyps    no trabeculation  - Ureteral orifices were normal in position and appearance.  There was evidence of subtle trigonitis but no significant pathology.  Post-Procedure: - Patient tolerated the procedure well  Assessment/ Plan:  1. Recurrent UTI Continue cranberry tablets Would benefit from topical estrogen Advised to return if she has any significant urinary symptoms for same-day UA/urine culture - Urinalysis, Complete - ciprofloxacin (CIPRO) tablet 500 mg - lidocaine (XYLOCAINE) 2 % jelly 1 application  2. Chronic constipation Symptoms improved with treatment of constipation Would recommend twice daily Colace for bowel regulation  3.  Atrophic vaginitis Discussed addition of topical estrogen cream, apply pea-sized amount per urethra/vagina 3 times per week, wash hands afterwards Risks of medication were discussed Samples were given today and Premarin called in pharmacy  F/u prn   Hollice Espy, MD

## 2018-02-21 DIAGNOSIS — J069 Acute upper respiratory infection, unspecified: Secondary | ICD-10-CM | POA: Diagnosis not present

## 2018-02-21 DIAGNOSIS — R6889 Other general symptoms and signs: Secondary | ICD-10-CM | POA: Diagnosis not present

## 2018-02-21 DIAGNOSIS — R509 Fever, unspecified: Secondary | ICD-10-CM | POA: Diagnosis not present

## 2018-02-21 DIAGNOSIS — Z20828 Contact with and (suspected) exposure to other viral communicable diseases: Secondary | ICD-10-CM | POA: Diagnosis not present

## 2018-02-28 DIAGNOSIS — I10 Essential (primary) hypertension: Secondary | ICD-10-CM | POA: Diagnosis not present

## 2018-02-28 DIAGNOSIS — E7800 Pure hypercholesterolemia, unspecified: Secondary | ICD-10-CM | POA: Diagnosis not present

## 2018-03-09 ENCOUNTER — Other Ambulatory Visit (INDEPENDENT_AMBULATORY_CARE_PROVIDER_SITE_OTHER): Payer: Medicare Other

## 2018-03-09 DIAGNOSIS — I10 Essential (primary) hypertension: Secondary | ICD-10-CM

## 2018-03-09 DIAGNOSIS — R7989 Other specified abnormal findings of blood chemistry: Secondary | ICD-10-CM

## 2018-03-09 DIAGNOSIS — M810 Age-related osteoporosis without current pathological fracture: Secondary | ICD-10-CM

## 2018-03-09 DIAGNOSIS — E7800 Pure hypercholesterolemia, unspecified: Secondary | ICD-10-CM

## 2018-03-09 DIAGNOSIS — R945 Abnormal results of liver function studies: Secondary | ICD-10-CM

## 2018-03-09 LAB — HEPATIC FUNCTION PANEL
ALBUMIN: 4.6 g/dL (ref 3.5–5.2)
ALK PHOS: 70 U/L (ref 39–117)
ALT: 17 U/L (ref 0–35)
AST: 19 U/L (ref 0–37)
Bilirubin, Direct: 0.1 mg/dL (ref 0.0–0.3)
TOTAL PROTEIN: 7.3 g/dL (ref 6.0–8.3)
Total Bilirubin: 0.3 mg/dL (ref 0.2–1.2)

## 2018-03-09 LAB — LIPID PANEL
CHOLESTEROL: 196 mg/dL (ref 0–200)
HDL: 66.7 mg/dL (ref >39.00)
LDL Cholesterol: 97 mg/dL (ref 0–99)
NONHDL: 129.23
Total CHOL/HDL Ratio: 3
Triglycerides: 159 mg/dL — ABNORMAL HIGH (ref 0.0–149.0)
VLDL: 31.8 mg/dL (ref 0.0–40.0)

## 2018-03-09 LAB — BASIC METABOLIC PANEL
BUN: 16 mg/dL (ref 6–23)
CALCIUM: 11 mg/dL — AB (ref 8.4–10.5)
CO2: 25 meq/L (ref 19–32)
Chloride: 105 mEq/L (ref 96–112)
Creatinine, Ser: 0.92 mg/dL (ref 0.40–1.20)
GFR: 63.76 mL/min (ref >60.00)
GLUCOSE: 100 mg/dL — AB (ref 70–99)
Potassium: 3.9 mEq/L (ref 3.5–5.1)
SODIUM: 144 meq/L (ref 135–145)

## 2018-03-09 LAB — VITAMIN D 25 HYDROXY (VIT D DEFICIENCY, FRACTURES): VITD: 40.32 ng/mL (ref 30.00–100.00)

## 2018-03-11 ENCOUNTER — Ambulatory Visit (INDEPENDENT_AMBULATORY_CARE_PROVIDER_SITE_OTHER): Payer: Medicare Other | Admitting: Internal Medicine

## 2018-03-11 ENCOUNTER — Encounter: Payer: Self-pay | Admitting: Internal Medicine

## 2018-03-11 DIAGNOSIS — Z9109 Other allergy status, other than to drugs and biological substances: Secondary | ICD-10-CM | POA: Diagnosis not present

## 2018-03-11 DIAGNOSIS — E7800 Pure hypercholesterolemia, unspecified: Secondary | ICD-10-CM | POA: Diagnosis not present

## 2018-03-11 DIAGNOSIS — R945 Abnormal results of liver function studies: Secondary | ICD-10-CM

## 2018-03-11 DIAGNOSIS — I10 Essential (primary) hypertension: Secondary | ICD-10-CM | POA: Diagnosis not present

## 2018-03-11 DIAGNOSIS — M810 Age-related osteoporosis without current pathological fracture: Secondary | ICD-10-CM

## 2018-03-11 DIAGNOSIS — N390 Urinary tract infection, site not specified: Secondary | ICD-10-CM

## 2018-03-11 DIAGNOSIS — R7989 Other specified abnormal findings of blood chemistry: Secondary | ICD-10-CM

## 2018-03-11 NOTE — Progress Notes (Signed)
Patient ID: Jamie Elliott, female   DOB: 1946/07/27, 72 y.o.   MRN: [id number]   Subjective:    Patient ID: Jamie Elliott, female    DOB: 01-Jul-1946, 72 y.o.   MRN: [id number]  HPI  Patient here for a scheduled follow up.  States she has had problems with increased sneezing and drainage.  Started over the last two days.  Some increased post nasal drainage.  Scratchy throat.  No chest congestion.  No cough.  No chest pain.  Breathing stable.  No acid reflux.  No abdominal pain.  Bowels moving.  Saw urology.  Not using estrogen cream.  Taking cranberry tablets.   States she does not want to use the medication.  Discussed lab results.  Increased calcium.  On amlodipine.  Blood pressure better.  Has f/u with cardiology in 08/2018.     Past Medical History:  Diagnosis Date  . Environmental allergies   . Hypercholesterolemia   . Hypertension   . Migraines    Past Surgical History:  Procedure Laterality Date  . ABDOMINAL HYSTERECTOMY  2000  . APPENDECTOMY  2000  . NOSE SURGERY  1967  . SEPTOPLASTY    . SHOULDER ARTHROSCOPY WITH OPEN ROTATOR CUFF REPAIR Right 12/05/2015   Procedure: SHOULDER ARTHROSCOPY WITH MINI OPEN ROTATOR CUFF REPAIR. DISTAL CLAVICLE EXCISION;  Surgeon: Thornton Park, MD;  Location: [facility] ORS;  Service: Orthopedics;  Laterality: Right;   Family History  Problem Relation Age of Onset  . Heart disease Mother   . Hyperlipidemia Mother   . Hypertension Mother   . Cancer Father        lung, prostate  . Hyperlipidemia Father   . Hypertension Father   . Prostate cancer Father   . Bladder Cancer Neg Hx   . Kidney cancer Neg Hx    Social History   Socioeconomic History  . Marital status: Married    Spouse name: Not on file  . Number of children: 2  . Years of education: Not on file  . Highest education level: Not on file  Occupational History  . Not on file  Social Needs  . Financial resource strain: Not on file  . Food insecurity:    Worry: Not on file   Inability: Not on file  . Transportation needs:    Medical: Not on file    Non-medical: Not on file  Tobacco Use  . Smoking status: Never Smoker  . Smokeless tobacco: Never Used  Substance and Sexual Activity  . Alcohol use: No    Alcohol/week: 0.0 oz  . Drug use: No  . Sexual activity: Not Currently  Lifestyle  . Physical activity:    Days per week: Not on file    Minutes per session: Not on file  . Stress: Not on file  Relationships  . Social connections:    Talks on phone: Not on file    Gets together: Not on file    Attends religious service: Not on file    Active member of club or organization: Not on file    Attends meetings of clubs or organizations: Not on file    Relationship status: Not on file  Other Topics Concern  . Not on file  Social History Narrative  . Not on file    Outpatient Encounter Medications as of 03/11/2018  Medication Sig  . acetaminophen (TYLENOL) 500 MG tablet Take 500 mg by mouth every 6 (six) hours as needed.  Marland Kitchen amLODipine (NORVASC) 2.5 MG tablet  Take 1 tablet (2.5 mg total) by mouth daily.  Marland Kitchen aspirin 325 MG EC tablet Take 325 mg by mouth daily.  Marland Kitchen atorvastatin (LIPITOR) 20 MG tablet Take 1 tablet (20 mg total) daily by mouth.  . cetirizine (ZYRTEC) 10 MG tablet Take 1 tablet (10 mg total) by mouth daily.  . Coenzyme Q10 (CO Q 10 PO) Take by mouth 2 (two) times daily.  Marland Kitchen conjugated estrogens (PREMARIN) vaginal cream Place 1 Applicatorful vaginally daily. Use pea sized amount M-W-Fr before bedtime  . doxycycline (VIBRA-TABS) 100 MG tablet Take 1 tablet (100 mg total) by mouth 2 (two) times daily.  . Hypertonic Nasal Wash (SINUS RINSE NA) Place into the nose daily.  Marland Kitchen losartan-hydrochlorothiazide (HYZAAR) 100-12.5 MG tablet Take 1 tablet by mouth daily.  . Probiotic Product (ALIGN PO) Take by mouth as needed.  . triamcinolone (NASACORT) 55 MCG/ACT nasal inhaler Place 2 sprays into the nose as needed.  . [DISCONTINUED] Calcium Carbonate-Vitamin  D (CALCIUM 600+D) 600-200 MG-UNIT TABS Take by mouth 2 (two) times daily.  . [DISCONTINUED] ibuprofen (ADVIL,MOTRIN) 200 MG tablet Take 200 mg by mouth every 6 (six) hours as needed.   . [DISCONTINUED] Omega-3 Fatty Acids (FISH OIL) 1000 MG CAPS Take by mouth 2 (two) times daily.   No facility-administered encounter medications on file as of 03/11/2018.     Review of Systems  Constitutional: Negative for appetite change and unexpected weight change.  HENT: Positive for congestion and postnasal drip. Negative for sinus pressure.        Sneezing.   Respiratory: Negative for cough, chest tightness and shortness of breath.   Cardiovascular: Negative for chest pain, palpitations and leg swelling.  Gastrointestinal: Negative for abdominal pain, diarrhea, nausea and vomiting.  Genitourinary: Negative for difficulty urinating and dysuria.  Musculoskeletal: Negative for joint swelling and myalgias.  Skin: Negative for color change and rash.  Neurological: Negative for dizziness, light-headedness and headaches.  Psychiatric/Behavioral: Negative for agitation and dysphoric mood.       Objective:    Physical Exam  Constitutional: She appears well-developed and well-nourished. No distress.  HENT:  Mouth/Throat: Oropharynx is clear and moist.  No sinus tenderness to palpation.  Nares - slightly erythematous turbinates.    Eyes: Conjunctivae are normal. Right eye exhibits no discharge. Left eye exhibits no discharge.  Neck: Neck supple. No thyromegaly present.  Cardiovascular: Normal rate and regular rhythm.  Pulmonary/Chest: Breath sounds normal. No respiratory distress. She has no wheezes.  Abdominal: Soft. Bowel sounds are normal. There is no tenderness.  Musculoskeletal: She exhibits no edema or tenderness.  Lymphadenopathy:    She has no cervical adenopathy.  Skin: No rash noted. No erythema.  Psychiatric: She has a normal mood and affect. Her behavior is normal.    BP 126/70 (BP  Location: Left Arm, Patient Position: Sitting, Cuff Size: Normal)   Pulse 75   Temp 98.4 F (36.9 C) (Oral)   Resp 18   Ht 5\' 2"  (1.575 m)   Wt 120 lb 9.6 oz (54.7 kg)   LMP 11/28/1998   SpO2 98%   BMI 22.06 kg/m  Wt Readings from Last 3 Encounters:  03/11/18 120 lb 9.6 oz (54.7 kg)  02/02/18 120 lb (54.4 kg)  01/14/18 120 lb 12.8 oz (54.8 kg)     Lab Results  Component Value Date   WBC 8.2 06/14/2017   HGB 13.4 06/14/2017   HCT 39.7 06/14/2017   PLT 231.0 06/14/2017   GLUCOSE 100 (H) 03/09/2018  CHOL 196 03/09/2018   TRIG 159.0 (H) 03/09/2018   HDL 66.70 03/09/2018   LDLDIRECT 103.0 05/21/2015   LDLCALC 97 03/09/2018   ALT 17 03/09/2018   AST 19 03/09/2018   NA 144 03/09/2018   K 3.9 03/09/2018   CL 105 03/09/2018   CREATININE 0.92 03/09/2018   BUN 16 03/09/2018   CO2 25 03/09/2018   TSH 1.41 06/14/2017   INR 1.00 11/25/2015    Dg Abd 1 View  Result Date: 01/11/2018 CLINICAL DATA:  Low back pain and urinary tract infections for the past 2 months. No nausea, vomiting, or fever. EXAM: ABDOMEN - 1 VIEW COMPARISON:  None in PACs FINDINGS: The colonic stool burden is mildly increased. There no small or large bowel obstructive pattern. There are no abnormal soft tissue calcifications. There are pelvic phleboliths. There is gentle levocurvature centered in the mid lumbar spine. Mild degenerative disc disease is noted in the upper and lower lumbar spine. IMPRESSION: Mildly increased colonic stool burden may reflect constipation in the appropriate clinical setting. No acute intra-abdominal abnormality is observed. Mild degenerative disc change with scoliosis of the lumbar spine. Electronically Signed   By: David  Martinique M.D.   On: 01/11/2018 11:31       Assessment & Plan:   Problem List Items Addressed This Visit    Abnormal liver function tests    Follow liver panel.        Environmental allergies    Started with sneezing two days ago. Some drainage.  Treat with  nasacort and saline nasal spray as directed.  Follow.  Hold abx.       Hypercholesterolemia    Discussed lab results.  Discussed diet and exercise.  On lipitor.  Follow lipid panel and liver function tests.   Lab Results  Component Value Date   CHOL 196 03/09/2018   HDL 66.70 03/09/2018   LDLCALC 97 03/09/2018   LDLDIRECT 103.0 05/21/2015   TRIG 159.0 (H) 03/09/2018   CHOLHDL 3 03/09/2018        Hypertension    Blood pressure under good control.  Continue same medication regimen.  Follow pressures.  Follow metabolic panel.        Osteoporosis    Bone density stable - 07/2017.        Recurrent UTI    Seeing urology.  Taking cranberry tablets.  No problems recently.  Follow.         Other Visit Diagnoses    Hypercalcemia    -  Primary   Relevant Orders   Calcium       Einar Pheasant, MD

## 2018-03-13 ENCOUNTER — Encounter: Payer: Self-pay | Admitting: Internal Medicine

## 2018-03-13 NOTE — Assessment & Plan Note (Signed)
Started with sneezing two days ago. Some drainage.  Treat with nasacort and saline nasal spray as directed.  Follow.  Hold abx.

## 2018-03-13 NOTE — Assessment & Plan Note (Signed)
Blood pressure under good control.  Continue same medication regimen.  Follow pressures.  Follow metabolic panel.   

## 2018-03-13 NOTE — Assessment & Plan Note (Signed)
Follow liver panel.  

## 2018-03-13 NOTE — Assessment & Plan Note (Signed)
Discussed lab results.  Discussed diet and exercise.  On lipitor.  Follow lipid panel and liver function tests.   Lab Results  Component Value Date   CHOL 196 03/09/2018   HDL 66.70 03/09/2018   LDLCALC 97 03/09/2018   LDLDIRECT 103.0 05/21/2015   TRIG 159.0 (H) 03/09/2018   CHOLHDL 3 03/09/2018

## 2018-03-13 NOTE — Assessment & Plan Note (Signed)
Seeing urology.  Taking cranberry tablets.  No problems recently.  Follow.

## 2018-03-13 NOTE — Assessment & Plan Note (Signed)
Bone density stable - 07/2017.

## 2018-03-29 ENCOUNTER — Other Ambulatory Visit (INDEPENDENT_AMBULATORY_CARE_PROVIDER_SITE_OTHER): Payer: Medicare Other

## 2018-03-29 LAB — CALCIUM: Calcium: 9.7 mg/dL (ref 8.4–10.5)

## 2018-03-30 ENCOUNTER — Encounter: Payer: Self-pay | Admitting: Internal Medicine

## 2018-04-01 ENCOUNTER — Other Ambulatory Visit: Payer: Medicare Other

## 2018-04-04 ENCOUNTER — Other Ambulatory Visit: Payer: Self-pay | Admitting: Internal Medicine

## 2018-04-06 ENCOUNTER — Other Ambulatory Visit: Payer: Self-pay | Admitting: Internal Medicine

## 2018-05-19 DIAGNOSIS — D2262 Melanocytic nevi of left upper limb, including shoulder: Secondary | ICD-10-CM | POA: Diagnosis not present

## 2018-05-19 DIAGNOSIS — D2261 Melanocytic nevi of right upper limb, including shoulder: Secondary | ICD-10-CM | POA: Diagnosis not present

## 2018-05-19 DIAGNOSIS — D2272 Melanocytic nevi of left lower limb, including hip: Secondary | ICD-10-CM | POA: Diagnosis not present

## 2018-05-19 DIAGNOSIS — D2271 Melanocytic nevi of right lower limb, including hip: Secondary | ICD-10-CM | POA: Diagnosis not present

## 2018-05-20 DIAGNOSIS — H2513 Age-related nuclear cataract, bilateral: Secondary | ICD-10-CM | POA: Diagnosis not present

## 2018-05-23 ENCOUNTER — Encounter: Payer: Self-pay | Admitting: Internal Medicine

## 2018-05-23 ENCOUNTER — Ambulatory Visit (INDEPENDENT_AMBULATORY_CARE_PROVIDER_SITE_OTHER): Payer: Medicare Other | Admitting: Internal Medicine

## 2018-05-23 VITALS — BP 128/72 | HR 71 | Temp 98.3°F | Resp 18 | Wt 120.6 lb

## 2018-05-23 DIAGNOSIS — Z9109 Other allergy status, other than to drugs and biological substances: Secondary | ICD-10-CM | POA: Diagnosis not present

## 2018-05-23 DIAGNOSIS — E7800 Pure hypercholesterolemia, unspecified: Secondary | ICD-10-CM

## 2018-05-23 DIAGNOSIS — R945 Abnormal results of liver function studies: Secondary | ICD-10-CM | POA: Diagnosis not present

## 2018-05-23 DIAGNOSIS — I10 Essential (primary) hypertension: Secondary | ICD-10-CM | POA: Diagnosis not present

## 2018-05-23 DIAGNOSIS — R319 Hematuria, unspecified: Secondary | ICD-10-CM

## 2018-05-23 DIAGNOSIS — R7989 Other specified abnormal findings of blood chemistry: Secondary | ICD-10-CM

## 2018-05-23 DIAGNOSIS — M25551 Pain in right hip: Secondary | ICD-10-CM | POA: Diagnosis not present

## 2018-05-23 NOTE — Progress Notes (Signed)
Patient ID: Jamie Elliott, female   DOB: [birth year], 72 y.o.   MRN: [id number]   Subjective:    Patient ID: Jamie Elliott, female    DOB: 24-Aug-1946, 72 y.o.   MRN: [id number]  HPI  Patient here for a scheduled follow up.  She reports she is doing relatively well.  Staying active.  No chest pain.  No sob.  No acid reflux.  No abdominal pain.  Bowels moving.  No urine change.  She does report that her right hip - occasionally feels like right hip catches.  No injury.  No significant pain.  No chest pain.  No sob.  No acid reflux.  No abdominal pain.     Past Medical History:  Diagnosis Date  . Environmental allergies   . Hypercholesterolemia   . Hypertension   . Migraines    Past Surgical History:  Procedure Laterality Date  . ABDOMINAL HYSTERECTOMY  2000  . APPENDECTOMY  2000  . NOSE SURGERY  1967  . SEPTOPLASTY    . SHOULDER ARTHROSCOPY WITH OPEN ROTATOR CUFF REPAIR Right 12/05/2015   Procedure: SHOULDER ARTHROSCOPY WITH MINI OPEN ROTATOR CUFF REPAIR. DISTAL CLAVICLE EXCISION;  Surgeon: Thornton Park, MD;  Location: [facility] ORS;  Service: Orthopedics;  Laterality: Right;   Family History  Problem Relation Age of Onset  . Heart disease Mother   . Hyperlipidemia Mother   . Hypertension Mother   . Cancer Father        lung, prostate  . Hyperlipidemia Father   . Hypertension Father   . Prostate cancer Father   . Bladder Cancer Neg Hx   . Kidney cancer Neg Hx    Social History   Socioeconomic History  . Marital status: Married    Spouse name: Not on file  . Number of children: 2  . Years of education: Not on file  . Highest education level: Not on file  Occupational History  . Not on file  Social Needs  . Financial resource strain: Not on file  . Food insecurity:    Worry: Not on file    Inability: Not on file  . Transportation needs:    Medical: Not on file    Non-medical: Not on file  Tobacco Use  . Smoking status: Never Smoker  . Smokeless tobacco: Never Used    Substance and Sexual Activity  . Alcohol use: No    Alcohol/week: 0.0 oz  . Drug use: No  . Sexual activity: Not Currently  Lifestyle  . Physical activity:    Days per week: Not on file    Minutes per session: Not on file  . Stress: Not on file  Relationships  . Social connections:    Talks on phone: Not on file    Gets together: Not on file    Attends religious service: Not on file    Active member of club or organization: Not on file    Attends meetings of clubs or organizations: Not on file    Relationship status: Not on file  Other Topics Concern  . Not on file  Social History Narrative  . Not on file    Outpatient Encounter Medications as of 05/23/2018  Medication Sig  . acetaminophen (TYLENOL) 500 MG tablet Take 500 mg by mouth every 6 (six) hours as needed.  Marland Kitchen amLODipine (NORVASC) 2.5 MG tablet Take 1 tablet (2.5 mg total) by mouth daily.  Marland Kitchen aspirin 325 MG EC tablet Take 325 mg by mouth daily.  Marland Kitchen  atorvastatin (LIPITOR) 20 MG tablet Take 1 tablet (20 mg total) daily by mouth.  . cetirizine (ZYRTEC) 10 MG tablet Take 1 tablet (10 mg total) by mouth daily.  . Coenzyme Q10 (CO Q 10 PO) Take by mouth 2 (two) times daily.  Marland Kitchen conjugated estrogens (PREMARIN) vaginal cream Place 1 Applicatorful vaginally daily. Use pea sized amount M-W-Fr before bedtime  . doxycycline (VIBRA-TABS) 100 MG tablet Take 1 tablet (100 mg total) by mouth 2 (two) times daily.  . Hypertonic Nasal Wash (SINUS RINSE NA) Place into the nose daily.  Marland Kitchen losartan-hydrochlorothiazide (HYZAAR) 100-12.5 MG tablet Take 1 tablet by mouth daily.  . Probiotic Product (ALIGN PO) Take by mouth as needed.  . triamcinolone (NASACORT) 55 MCG/ACT nasal inhaler Place 2 sprays into the nose as needed.   No facility-administered encounter medications on file as of 05/23/2018.     Review of Systems  Constitutional: Negative for appetite change and unexpected weight change.  HENT: Negative for congestion and sinus pressure.    Respiratory: Negative for cough, chest tightness and shortness of breath.   Cardiovascular: Negative for chest pain, palpitations and leg swelling.  Gastrointestinal: Negative for abdominal pain, diarrhea, nausea and vomiting.  Genitourinary: Negative for difficulty urinating and dysuria.  Musculoskeletal: Negative for joint swelling and myalgias.       Right hip catching as outlined.    Skin: Negative for color change and rash.  Neurological: Negative for dizziness, light-headedness and headaches.  Psychiatric/Behavioral: Negative for agitation and dysphoric mood.       Objective:    Physical Exam  Constitutional: She appears well-developed and well-nourished. No distress.  HENT:  Nose: Nose normal.  Mouth/Throat: Oropharynx is clear and moist.  Neck: Neck supple. No thyromegaly present.  Cardiovascular: Normal rate and regular rhythm.  Pulmonary/Chest: Breath sounds normal. No respiratory distress. She has no wheezes.  Abdominal: Soft. Bowel sounds are normal. There is no tenderness.  Musculoskeletal: She exhibits no edema or tenderness.  Lymphadenopathy:    She has no cervical adenopathy.  Skin: No rash noted. No erythema.  Psychiatric: She has a normal mood and affect. Her behavior is normal.    BP 128/72 (BP Location: Left Arm, Patient Position: Sitting, Cuff Size: Normal)   Pulse 71   Temp 98.3 F (36.8 C) (Oral)   Resp 18   Wt 120 lb 9.6 oz (54.7 kg)   LMP 11/28/1998   SpO2 98%   BMI 22.06 kg/m  Wt Readings from Last 3 Encounters:  05/23/18 120 lb 9.6 oz (54.7 kg)  03/11/18 120 lb 9.6 oz (54.7 kg)  02/02/18 120 lb (54.4 kg)     Lab Results  Component Value Date   WBC 8.2 06/14/2017   HGB 13.4 06/14/2017   HCT 39.7 06/14/2017   PLT 231.0 06/14/2017   GLUCOSE 100 (H) 03/09/2018   CHOL 196 03/09/2018   TRIG 159.0 (H) 03/09/2018   HDL 66.70 03/09/2018   LDLDIRECT 103.0 05/21/2015   LDLCALC 97 03/09/2018   ALT 17 03/09/2018   AST 19 03/09/2018   NA 144  03/09/2018   K 3.9 03/09/2018   CL 105 03/09/2018   CREATININE 0.92 03/09/2018   BUN 16 03/09/2018   CO2 25 03/09/2018   TSH 1.41 06/14/2017   INR 1.00 11/25/2015    Dg Abd 1 View  Result Date: 01/11/2018 CLINICAL DATA:  Low back pain and urinary tract infections for the past 2 months. No nausea, vomiting, or fever. EXAM: ABDOMEN - 1 VIEW COMPARISON:  None in PACs FINDINGS: The colonic stool burden is mildly increased. There no small or large bowel obstructive pattern. There are no abnormal soft tissue calcifications. There are pelvic phleboliths. There is gentle levocurvature centered in the mid lumbar spine. Mild degenerative disc disease is noted in the upper and lower lumbar spine. IMPRESSION: Mildly increased colonic stool burden may reflect constipation in the appropriate clinical setting. No acute intra-abdominal abnormality is observed. Mild degenerative disc change with scoliosis of the lumbar spine. Electronically Signed   By: David  Martinique M.D.   On: 01/11/2018 11:31       Assessment & Plan:   Problem List Items Addressed This Visit    Abnormal liver function tests    Follow liver panel.       Relevant Orders   Hepatic function panel   Environmental allergies    Controlled on current regimen.  Follow.       Hypercholesterolemia    On lipitor.  Low cholesterol diet and exercise.  Follow lipid panel and liver function tests.        Relevant Orders   Lipid panel   Hypertension    Blood pressure under good control.  Continue same medication regimen.  Follow pressures.  Follow metabolic panel.        Relevant Orders   CBC with Differential/Platelet   TSH   Basic metabolic panel   Right hip pain    Intermittent right hip catching as outlined.  Discussed xray.  She wants to hold at this time.  Follow.  Will notify me if persistent problems.         Other Visit Diagnoses    Hematuria, unspecified type    -  Primary   Relevant Orders   Urinalysis, Routine w reflex  microscopic       Einar Pheasant, MD

## 2018-05-24 ENCOUNTER — Encounter: Payer: Self-pay | Admitting: Internal Medicine

## 2018-05-24 DIAGNOSIS — M25551 Pain in right hip: Secondary | ICD-10-CM | POA: Insufficient documentation

## 2018-05-24 NOTE — Assessment & Plan Note (Signed)
Controlled on current regimen.  Follow.  

## 2018-05-24 NOTE — Assessment & Plan Note (Signed)
Blood pressure under good control.  Continue same medication regimen.  Follow pressures.  Follow metabolic panel.   

## 2018-05-24 NOTE — Assessment & Plan Note (Signed)
On lipitor.  Low cholesterol diet and exercise.  Follow lipid panel and liver function tests.   

## 2018-05-24 NOTE — Assessment & Plan Note (Signed)
Follow liver panel.  

## 2018-05-24 NOTE — Assessment & Plan Note (Signed)
Intermittent right hip catching as outlined.  Discussed xray.  She wants to hold at this time.  Follow.  Will notify me if persistent problems.

## 2018-05-30 ENCOUNTER — Other Ambulatory Visit: Payer: Self-pay | Admitting: Internal Medicine

## 2018-06-03 ENCOUNTER — Encounter: Payer: Self-pay | Admitting: Family Medicine

## 2018-06-03 ENCOUNTER — Ambulatory Visit (INDEPENDENT_AMBULATORY_CARE_PROVIDER_SITE_OTHER): Payer: Medicare Other | Admitting: Family Medicine

## 2018-06-03 VITALS — BP 122/82 | HR 78 | Temp 98.5°F | Wt 119.4 lb

## 2018-06-03 DIAGNOSIS — J029 Acute pharyngitis, unspecified: Secondary | ICD-10-CM | POA: Diagnosis not present

## 2018-06-03 DIAGNOSIS — Z9109 Other allergy status, other than to drugs and biological substances: Secondary | ICD-10-CM | POA: Diagnosis not present

## 2018-06-03 LAB — POCT RAPID STREP A (OFFICE): Rapid Strep A Screen: NEGATIVE

## 2018-06-03 NOTE — Patient Instructions (Signed)
Good to see you today  I think your sore throat is from allergies/drainage- can take 1 to 2 over the counter ibuprofen every 8 to 12 hours  Continue gargles   If not better in 5-7 days please let us known

## 2018-06-03 NOTE — Progress Notes (Signed)
Subjective:    Patient ID: Jamie Elliott, female    DOB: March 14, 1946, 72 y.o.   MRN: [id number]  HPI This is a 72 yo female who presents today with scratchy throat for several days, has headache, no runny nose, some popping ears last week, feels like drainage in back of throat. No aches, subjective fever. Around grandchildren with strep recently. No cough, no SOB. A little fatigued. Took acetaminophen last night and used vinegar/salt water gargle. Some relief with acetaminophen.  Uses saline spray, ceterizine, and nasacot daily for severe environmental allergies. Has been outside more in last week with grandchildren.   Past Medical History:  Diagnosis Date  . Environmental allergies   . Hypercholesterolemia   . Hypertension   . Migraines    Past Surgical History:  Procedure Laterality Date  . ABDOMINAL HYSTERECTOMY  2000  . APPENDECTOMY  2000  . NOSE SURGERY  1967  . SEPTOPLASTY    . SHOULDER ARTHROSCOPY WITH OPEN ROTATOR CUFF REPAIR Right 12/05/2015   Procedure: SHOULDER ARTHROSCOPY WITH MINI OPEN ROTATOR CUFF REPAIR. DISTAL CLAVICLE EXCISION;  Surgeon: Thornton Park, MD;  Location: [facility] ORS;  Service: Orthopedics;  Laterality: Right;   Family History  Problem Relation Age of Onset  . Heart disease Mother   . Hyperlipidemia Mother   . Hypertension Mother   . Cancer Father        lung, prostate  . Hyperlipidemia Father   . Hypertension Father   . Prostate cancer Father   . Bladder Cancer Neg Hx   . Kidney cancer Neg Hx    Social History   Tobacco Use  . Smoking status: Never Smoker  . Smokeless tobacco: Never Used  Substance Use Topics  . Alcohol use: No    Alcohol/week: 0.0 oz  . Drug use: No       Review of Systems Per HPI    Objective:   Physical Exam  Constitutional: She is oriented to person, place, and time. She appears well-developed and well-nourished.  Non-toxic appearance. She does not appear ill.  HENT:  Head: Normocephalic and atraumatic.    Right Ear: Tympanic membrane and ear canal normal.  Left Ear: Tympanic membrane and ear canal normal.  Mouth/Throat: Uvula is midline, oropharynx is clear and moist and mucous membranes are normal.  Sounds mildly congested.   Neck: Normal range of motion. Neck supple.  Cardiovascular: Normal rate, regular rhythm and normal heart sounds.  Pulmonary/Chest: Effort normal and breath sounds normal.  Lymphadenopathy:    She has no cervical adenopathy.  Neurological: She is alert and oriented to person, place, and time.  Skin: Skin is warm and dry.  Psychiatric: She has a normal mood and affect. Her behavior is normal.  Vitals reviewed.     BP 122/82   Pulse 78   Temp 98.5 F (36.9 C)   Wt 119 lb 6.4 oz (54.2 kg)   LMP 11/28/1998   SpO2 97%   BMI 21.84 kg/m  Wt Readings from Last 3 Encounters:  06/03/18 119 lb 6.4 oz (54.2 kg)  05/23/18 120 lb 9.6 oz (54.7 kg)  03/11/18 120 lb 9.6 oz (54.7 kg)   Results for orders placed or performed in visit on 06/03/18  Rapid Strep A  Result Value Ref Range   Rapid Strep A Screen Negative Negative       Assessment & Plan:  1. Sore throat - Rapid Strep A- negative - likely from post nasal drainage from allergies/viral illness - discussed symptomatic  relief measures, RTC precuations  2. Environmental allergies - continue current meds, encouraged her to use nasal rinse after being outside   Clarene Reamer, FNP-BC  [place] [facility]  06/03/2018 12:56 PM

## 2018-06-08 ENCOUNTER — Ambulatory Visit: Payer: Self-pay | Admitting: Urology

## 2018-06-10 ENCOUNTER — Encounter: Payer: Self-pay | Admitting: Internal Medicine

## 2018-06-10 ENCOUNTER — Ambulatory Visit (INDEPENDENT_AMBULATORY_CARE_PROVIDER_SITE_OTHER): Payer: Medicare Other | Admitting: Internal Medicine

## 2018-06-10 VITALS — BP 130/58 | HR 88 | Temp 98.7°F | Ht 62.0 in | Wt 121.2 lb

## 2018-06-10 DIAGNOSIS — J0121 Acute recurrent ethmoidal sinusitis: Secondary | ICD-10-CM | POA: Diagnosis not present

## 2018-06-10 DIAGNOSIS — R509 Fever, unspecified: Secondary | ICD-10-CM

## 2018-06-10 DIAGNOSIS — R112 Nausea with vomiting, unspecified: Secondary | ICD-10-CM | POA: Diagnosis not present

## 2018-06-10 LAB — POC INFLUENZA A&B (BINAX/QUICKVUE)
INFLUENZA B, POC: NEGATIVE
Influenza A, POC: NEGATIVE

## 2018-06-10 MED ORDER — DOXYCYCLINE HYCLATE 100 MG PO TABS
100.0000 mg | ORAL_TABLET | Freq: Two times a day (BID) | ORAL | 0 refills | Status: DC
Start: 2018-06-10 — End: 2018-07-22

## 2018-06-10 MED ORDER — ONDANSETRON HCL 4 MG PO TABS: 4.0000 mg | ORAL_TABLET | Freq: Three times a day (TID) | ORAL | 0 refills | Status: DC | PRN

## 2018-06-10 NOTE — Patient Instructions (Addendum)
Try Doxycycline with food 2x per day x 10 days  Try zofran as needed for nausea   Bland Diet A bland diet consists of foods that do not have a lot of fat or fiber. Foods without fat or fiber are easier for the body to digest. They are also less likely to irritate your mouth, throat, stomach, and other parts of your gastrointestinal tract. A bland diet is sometimes called a BRAT diet. What is my plan? Your health care provider or dietitian may recommend specific changes to your diet to prevent and treat your symptoms, such as:  Eating small meals often.  Cooking food until it is soft enough to chew easily.  Chewing your food well.  Drinking fluids slowly.  Not eating foods that are very spicy, sour, or fatty.  Not eating citrus fruits, such as oranges and grapefruit.  What do I need to know about this diet?  Eat a variety of foods from the bland diet food list.  Do not follow a bland diet longer than you have to.  Ask your health care provider whether you should take vitamins. What foods can I eat? Grains  Hot cereals, such as cream of wheat. Bread, crackers, or tortillas made from refined white flour. Rice. Vegetables Canned or cooked vegetables. Mashed or boiled potatoes. Fruits Bananas. Applesauce. Other types of cooked or canned fruit with the skin and seeds removed, such as canned peaches or pears. Meats and Other Protein Sources Scrambled eggs. Creamy peanut butter or other nut butters. Lean, well-cooked meats, such as chicken or fish. Tofu. Soups or broths. Dairy Low-fat dairy products, such as milk, cottage cheese, or yogurt. Beverages Water. Herbal tea. Apple juice. Sweets and Desserts Pudding. Custard. Fruit gelatin. Ice cream. Fats and Oils Mild salad dressings. Canola or olive oil. The items listed above may not be a complete list of allowed foods or beverages. Contact your dietitian for more options. What foods are not recommended? Foods and ingredients that  are often not recommended include:  Spicy foods, such as hot sauce or salsa.  Fried foods.  Sour foods, such as pickled or fermented foods.  Raw vegetables or fruits, especially citrus or berries.  Caffeinated drinks.  Alcohol.  Strongly flavored seasonings or condiments.  The items listed above may not be a complete list of foods and beverages that are not allowed. Contact your dietitian for more information. This information is not intended to replace advice given to you by your health care provider. Make sure you discuss any questions you have with your health care provider. Document Released: 03/30/2016 Document Revised: 05/14/2016 Document Reviewed: 12/19/2014 Elsevier Interactive Patient Education  2018 [employer].  Sinusitis, Adult Sinusitis is soreness and inflammation of your sinuses. Sinuses are hollow spaces in the bones around your face. Your sinuses are located:  Around your eyes.  In the middle of your forehead.  Behind your nose.  In your cheekbones.  Your sinuses and nasal passages are lined with a stringy fluid (mucus). Mucus normally drains out of your sinuses. When your nasal tissues become inflamed or swollen, the mucus can become trapped or blocked so air cannot flow through your sinuses. This allows bacteria, viruses, and funguses to grow, which leads to infection. Sinusitis can develop quickly and last for 7?10 days (acute) or for more than 12 weeks (chronic). Sinusitis often develops after a cold. What are the causes? This condition is caused by anything that creates swelling in the sinuses or stops mucus from draining, including:  Allergies.  Asthma.  Bacterial or viral infection.  Abnormally shaped bones between the nasal passages.  Nasal growths that contain mucus (nasal polyps).  Narrow sinus openings.  Pollutants, such as chemicals or irritants in the air.  A foreign object stuck in the nose.  A fungal infection. This is  rare.  What increases the risk? The following factors may make you more likely to develop this condition:  Having allergies or asthma.  Having had a recent cold or respiratory tract infection.  Having structural deformities or blockages in your nose or sinuses.  Having a weak immune system.  Doing a lot of swimming or diving.  Overusing nasal sprays.  Smoking.  What are the signs or symptoms? The main symptoms of this condition are pain and a feeling of pressure around the affected sinuses. Other symptoms include:  Upper toothache.  Earache.  Headache.  Bad breath.  Decreased sense of smell and taste.  A cough that may get worse at night.  Fatigue.  Fever.  Thick drainage from your nose. The drainage is often green and it may contain pus (purulent).  Stuffy nose or congestion.  Postnasal drip. This is when extra mucus collects in the throat or back of the nose.  Swelling and warmth over the affected sinuses.  Sore throat.  Sensitivity to light.  How is this diagnosed? This condition is diagnosed based on symptoms, a medical history, and a physical exam. To find out if your condition is acute or chronic, your health care provider may:  Look in your nose for signs of nasal polyps.  Tap over the affected sinus to check for signs of infection.  View the inside of your sinuses using an imaging device that has a light attached (endoscope).  If your health care provider suspects that you have chronic sinusitis, you may also:  Be tested for allergies.  Have a sample of mucus taken from your nose (nasal culture) and checked for bacteria.  Have a mucus sample examined to see if your sinusitis is related to an allergy.  If your sinusitis does not respond to treatment and it lasts longer than 8 weeks, you may have an MRI or CT scan to check your sinuses. These scans also help to determine how severe your infection is. In rare cases, a bone biopsy may be done to  rule out more serious types of fungal sinus disease. How is this treated? Treatment for sinusitis depends on the cause and whether your condition is chronic or acute. If a virus is causing your sinusitis, your symptoms will go away on their own within 10 days. You may be given medicines to relieve your symptoms, including:  Topical nasal decongestants. They shrink swollen nasal passages and let mucus drain from your sinuses.  Antihistamines. These drugs block inflammation that is triggered by allergies. This can help to ease swelling in your nose and sinuses.  Topical nasal corticosteroids. These are nasal sprays that ease inflammation and swelling in your nose and sinuses.  Nasal saline washes. These rinses can help to get rid of thick mucus in your nose.  If your condition is caused by bacteria, you will be given an antibiotic medicine. If your condition is caused by a fungus, you will be given an antifungal medicine. Surgery may be needed to correct underlying conditions, such as narrow nasal passages. Surgery may also be needed to remove polyps. Follow these instructions at home: Medicines  Take, use, or apply over-the-counter and prescription medicines only as  told by your health care provider. These may include nasal sprays.  If you were prescribed an antibiotic medicine, take it as told by your health care provider. Do not stop taking the antibiotic even if you start to feel better. Hydrate and Humidify  Drink enough water to keep your urine clear or pale yellow. Staying hydrated will help to thin your mucus.  Use a cool mist humidifier to keep the humidity level in your home above 50%.  Inhale steam for 10-15 minutes, 3-4 times a day or as told by your health care provider. You can do this in the bathroom while a hot shower is running.  Limit your exposure to cool or dry air. Rest  Rest as much as possible.  Sleep with your head raised (elevated).  Make sure to get enough  sleep each night. General instructions  Apply a warm, moist washcloth to your face 3-4 times a day or as told by your health care provider. This will help with discomfort.  Wash your hands often with soap and water to reduce your exposure to viruses and other germs. If soap and water are not available, use hand sanitizer.  Do not smoke. Avoid being around people who are smoking (secondhand smoke).  Keep all follow-up visits as told by your health care provider. This is important. Contact a health care provider if:  You have a fever.  Your symptoms get worse.  Your symptoms do not improve within 10 days. Get help right away if:  You have a severe headache.  You have persistent vomiting.  You have pain or swelling around your face or eyes.  You have vision problems.  You develop confusion.  Your neck is stiff.  You have trouble breathing. This information is not intended to replace advice given to you by your health care provider. Make sure you discuss any questions you have with your health care provider. Document Released: 12/07/2005 Document Revised: 08/02/2016 Document Reviewed: 10/02/2015 Elsevier Interactive Patient Education  [employer].

## 2018-06-10 NOTE — Progress Notes (Signed)
Chief Complaint  Patient presents with  . URI  . Sinusitis  . Fever  . Chills   Sick visit with husband  1. She has not been feeling well since 06/03/18 and around sick grandkids who had +strep and their father got strep later. She c/o fatigue, hot cold chills since 6/14. Tues. Thought she was getting better but weds chills and aches started and Thurs. Had fever 103.1 and also fever 100.7 at home. She went to [facility] 06/03/18 strep was negative and she was told to try ibuprofen bid which she did and tylenol. She has also been using nasal saline, clarinex, nasacort. She has h/o sinus surgery and allergies. She had sore throat which has improved with warm salt gargles. She denies cough but has had sinus pressure and nasal discharge. This am she had n/v  Review of Systems  Constitutional: Positive for chills, fever and malaise/fatigue.  HENT: Positive for congestion, sinus pain and sore throat.   Eyes: Negative for blurred vision.  Respiratory: Negative for cough and shortness of breath.   Cardiovascular: Negative for chest pain.  Gastrointestinal: Positive for nausea and vomiting.  Neurological: Positive for headaches.   Past Medical History:  Diagnosis Date  . Environmental allergies   . Hypercholesterolemia   . Hypertension   . Migraines    Past Surgical History:  Procedure Laterality Date  . ABDOMINAL HYSTERECTOMY  2000  . APPENDECTOMY  2000  . NOSE SURGERY  1967  . SEPTOPLASTY    . SHOULDER ARTHROSCOPY WITH OPEN ROTATOR CUFF REPAIR Right 12/05/2015   Procedure: SHOULDER ARTHROSCOPY WITH MINI OPEN ROTATOR CUFF REPAIR. DISTAL CLAVICLE EXCISION;  Surgeon: Thornton Park, MD;  Location: [facility] ORS;  Service: Orthopedics;  Laterality: Right;   Family History  Problem Relation Age of Onset  . Heart disease Mother   . Hyperlipidemia Mother   . Hypertension Mother   . Cancer Father        lung, prostate  . Hyperlipidemia Father   . Hypertension Father   . Prostate cancer  Father   . Bladder Cancer Neg Hx   . Kidney cancer Neg Hx    Social History   Socioeconomic History  . Marital status: Married    Spouse name: Not on file  . Number of children: 2  . Years of education: Not on file  . Highest education level: Not on file  Occupational History  . Not on file  Social Needs  . Financial resource strain: Not on file  . Food insecurity:    Worry: Not on file    Inability: Not on file  . Transportation needs:    Medical: Not on file    Non-medical: Not on file  Tobacco Use  . Smoking status: Never Smoker  . Smokeless tobacco: Never Used  Substance and Sexual Activity  . Alcohol use: No    Alcohol/week: 0.0 oz  . Drug use: No  . Sexual activity: Not Currently  Lifestyle  . Physical activity:    Days per week: Not on file    Minutes per session: Not on file  . Stress: Not on file  Relationships  . Social connections:    Talks on phone: Not on file    Gets together: Not on file    Attends religious service: Not on file    Active member of club or organization: Not on file    Attends meetings of clubs or organizations: Not on file    Relationship status: Not on file  .  Intimate partner violence:    Fear of current or ex partner: Not on file    Emotionally abused: Not on file    Physically abused: Not on file    Forced sexual activity: Not on file  Other Topics Concern  . Not on file  Social History Narrative  . Not on file   Current Meds  Medication Sig  . acetaminophen (TYLENOL) 500 MG tablet Take 500 mg by mouth every 6 (six) hours as needed.  Marland Kitchen amLODipine (NORVASC) 2.5 MG tablet Take 1 tablet (2.5 mg total) by mouth daily.  Marland Kitchen aspirin 325 MG EC tablet Take 325 mg by mouth daily.  Marland Kitchen atorvastatin (LIPITOR) 20 MG tablet Take 1 tablet (20 mg total) daily by mouth.  . cetirizine (ZYRTEC) 10 MG tablet Take 1 tablet (10 mg total) by mouth daily.  . Coenzyme Q10 (CO Q 10 PO) Take by mouth 2 (two) times daily.  Marland Kitchen conjugated estrogens  (PREMARIN) vaginal cream Place 1 Applicatorful vaginally daily. Use pea sized amount M-W-Fr before bedtime  . Hypertonic Nasal Wash (SINUS RINSE NA) Place into the nose daily.  Marland Kitchen losartan-hydrochlorothiazide (HYZAAR) 100-12.5 MG tablet Take 1 tablet by mouth daily.  . Probiotic Product (ALIGN PO) Take by mouth as needed.  . triamcinolone (NASACORT) 55 MCG/ACT nasal inhaler Place 2 sprays into the nose as needed.   Allergies  Allergen Reactions  . Ciprofloxacin Other (See Comments)    Patient reports "it doesn't work for me". Last culture was sensitive, however.   . Zithromax [Azithromycin]   . Augmentin [Amoxicillin-Pot Clavulanate] Rash   Recent Results (from the past 2160 hour(s))  Calcium     Status: None   Collection Time: 03/29/18 10:04 AM  Result Value Ref Range   Calcium 9.7 8.4 - 10.5 mg/dL  Rapid Strep A     Status: Normal   Collection Time: 06/03/18 12:37 PM  Result Value Ref Range   Rapid Strep A Screen Negative Negative  POC Influenza A&B (Binax test)     Status: Normal   Collection Time: 06/10/18 11:53 AM  Result Value Ref Range   Influenza A, POC Negative Negative   Influenza B, POC Negative Negative   Objective  Body mass index is 22.17 kg/m. Wt Readings from Last 3 Encounters:  06/10/18 121 lb 3.2 oz (55 kg)  06/03/18 119 lb 6.4 oz (54.2 kg)  05/23/18 120 lb 9.6 oz (54.7 kg)   Temp Readings from Last 3 Encounters:  06/10/18 98.7 F (37.1 C) (Oral)  06/03/18 98.5 F (36.9 C)  05/23/18 98.3 F (36.8 C) (Oral)   BP Readings from Last 3 Encounters:  06/10/18 (!) 130/58  06/03/18 122/82  05/23/18 128/72   Pulse Readings from Last 3 Encounters:  06/10/18 88  06/03/18 78  05/23/18 71    Physical Exam  Constitutional: She is oriented to person, place, and time. Vital signs are normal. She appears well-developed and well-nourished. She is cooperative.  HENT:  Head: Normocephalic and atraumatic.  Nose: Right sinus exhibits maxillary sinus tenderness.  Right sinus exhibits no frontal sinus tenderness. Left sinus exhibits maxillary sinus tenderness. Left sinus exhibits no frontal sinus tenderness.  Mouth/Throat: Oropharynx is clear and moist and mucous membranes are normal.  B/l ethmoid ttp   Eyes: Pupils are equal, round, and reactive to light. Conjunctivae are normal.  Cardiovascular: Normal rate, regular rhythm and normal heart sounds.  Pulmonary/Chest: Effort normal and breath sounds normal.  Neurological: She is alert and oriented to person, place,  and time. Gait normal.  Skin: Skin is warm, dry and intact.  Psychiatric: She has a normal mood and affect. Her speech is normal and behavior is normal. Judgment and thought content normal. Cognition and memory are normal.  Nursing note and vitals reviewed.   Assessment   1. Sinusitis with fever flu negative today  2. N/v  Plan   1. Doxy bid x 10 days  Prn Tylenol  NS, Nasacort  If not better consider CT sinus in future  Flu test negative today  2. Bland diet prn Zofran    Provider: Dr. Olivia Mackie McLean-Scocuzza-[facility] Medicine

## 2018-06-10 NOTE — Progress Notes (Signed)
Pre visit review using our clinic review tool, if applicable. No additional management support is needed unless otherwise documented below in the visit note. 

## 2018-06-16 ENCOUNTER — Telehealth: Payer: Self-pay | Admitting: *Deleted

## 2018-06-16 NOTE — Telephone Encounter (Signed)
Copied from [place]. Topic: Quick Communication - See Telephone Encounter >> Jun 15, 2018  9:52 AM Marja Kays F wrote: Pt saw the provider on Friday for a sinus infection and is feeling just a little better very tired all the time and now has developed a rash between her breast  Kinda blistery looking-her fever broke on Sunday and then the rash developed Tuesday night pt is needing to know what to do next   Best number [phone] or cell [phone]

## 2018-06-16 NOTE — Telephone Encounter (Signed)
Patient stated that the rash is starting to get better. It has not spread. She would like to continue to monitor and if it starts to spread or get worse she will call back.

## 2018-06-16 NOTE — Telephone Encounter (Signed)
Noted. We can work her in if feels needs to be seen.

## 2018-07-04 ENCOUNTER — Other Ambulatory Visit: Payer: Self-pay | Admitting: Internal Medicine

## 2018-07-20 ENCOUNTER — Other Ambulatory Visit (INDEPENDENT_AMBULATORY_CARE_PROVIDER_SITE_OTHER): Payer: Medicare Other

## 2018-07-20 DIAGNOSIS — I10 Essential (primary) hypertension: Secondary | ICD-10-CM

## 2018-07-20 DIAGNOSIS — R319 Hematuria, unspecified: Secondary | ICD-10-CM | POA: Diagnosis not present

## 2018-07-20 DIAGNOSIS — R7989 Other specified abnormal findings of blood chemistry: Secondary | ICD-10-CM

## 2018-07-20 DIAGNOSIS — R945 Abnormal results of liver function studies: Secondary | ICD-10-CM

## 2018-07-20 DIAGNOSIS — E7800 Pure hypercholesterolemia, unspecified: Secondary | ICD-10-CM

## 2018-07-20 LAB — CBC WITH DIFFERENTIAL/PLATELET
BASOS ABS: 0 10*3/uL (ref 0.0–0.1)
Basophils Relative: 0.6 % (ref 0.0–3.0)
EOS PCT: 4.2 % (ref 0.0–5.0)
Eosinophils Absolute: 0.3 10*3/uL (ref 0.0–0.7)
HEMATOCRIT: 38.2 % (ref 36.0–46.0)
Hemoglobin: 12.9 g/dL (ref 12.0–15.0)
LYMPHS PCT: 24.4 % (ref 12.0–46.0)
Lymphs Abs: 1.6 10*3/uL (ref 0.7–4.0)
MCHC: 33.8 g/dL (ref 30.0–36.0)
MCV: 91.9 fl (ref 78.0–100.0)
Monocytes Absolute: 0.4 10*3/uL (ref 0.1–1.0)
Monocytes Relative: 6.4 % (ref 3.0–12.0)
NEUTROS ABS: 4.3 10*3/uL (ref 1.4–7.7)
Neutrophils Relative %: 64.4 % (ref 43.0–77.0)
PLATELETS: 224 10*3/uL (ref 150.0–400.0)
RBC: 4.16 Mil/uL (ref 3.87–5.11)
RDW: 13.9 % (ref 11.5–15.5)
WBC: 6.6 10*3/uL (ref 4.0–10.5)

## 2018-07-20 LAB — HEPATIC FUNCTION PANEL
ALT: 19 U/L (ref 0–35)
AST: 19 U/L (ref 0–37)
Albumin: 3.9 g/dL (ref 3.5–5.2)
Alkaline Phosphatase: 69 U/L (ref 39–117)
BILIRUBIN DIRECT: 0.1 mg/dL (ref 0.0–0.3)
TOTAL PROTEIN: 6.5 g/dL (ref 6.0–8.3)
Total Bilirubin: 0.4 mg/dL (ref 0.2–1.2)

## 2018-07-20 LAB — URINALYSIS, ROUTINE W REFLEX MICROSCOPIC
Bilirubin Urine: NEGATIVE
HGB URINE DIPSTICK: NEGATIVE
Ketones, ur: NEGATIVE
Leukocytes, UA: NEGATIVE
Nitrite: NEGATIVE
PH: 5 (ref 5.0–8.0)
Specific Gravity, Urine: 1.02 (ref 1.000–1.030)
Total Protein, Urine: NEGATIVE
Urine Glucose: NEGATIVE
Urobilinogen, UA: 0.2 (ref 0.0–1.0)

## 2018-07-20 LAB — LIPID PANEL
CHOL/HDL RATIO: 3
Cholesterol: 161 mg/dL (ref 0–200)
HDL: 62.5 mg/dL (ref >39.00)
LDL CALC: 76 mg/dL (ref 0–99)
NonHDL: 98.37
TRIGLYCERIDES: 110 mg/dL (ref 0.0–149.0)
VLDL: 22 mg/dL (ref 0.0–40.0)

## 2018-07-20 LAB — BASIC METABOLIC PANEL
BUN: 17 mg/dL (ref 6–23)
CALCIUM: 9.7 mg/dL (ref 8.4–10.5)
CO2: 30 meq/L (ref 19–32)
Chloride: 105 mEq/L (ref 96–112)
Creatinine, Ser: 0.82 mg/dL (ref 0.40–1.20)
GFR: 72.74 mL/min (ref >60.00)
Glucose, Bld: 76 mg/dL (ref 70–99)
Potassium: 3.5 mEq/L (ref 3.5–5.1)
SODIUM: 141 meq/L (ref 135–145)

## 2018-07-20 LAB — TSH: TSH: 1.97 u[IU]/mL (ref 0.35–4.50)

## 2018-07-21 ENCOUNTER — Encounter: Payer: Self-pay | Admitting: Internal Medicine

## 2018-07-22 ENCOUNTER — Encounter: Payer: Self-pay | Admitting: Internal Medicine

## 2018-07-22 ENCOUNTER — Ambulatory Visit (INDEPENDENT_AMBULATORY_CARE_PROVIDER_SITE_OTHER): Payer: Medicare Other | Admitting: Internal Medicine

## 2018-07-22 VITALS — BP 120/70 | HR 73 | Temp 97.8°F | Resp 17 | Ht 62.0 in | Wt 120.1 lb

## 2018-07-22 DIAGNOSIS — I10 Essential (primary) hypertension: Secondary | ICD-10-CM

## 2018-07-22 DIAGNOSIS — R945 Abnormal results of liver function studies: Secondary | ICD-10-CM

## 2018-07-22 DIAGNOSIS — E7800 Pure hypercholesterolemia, unspecified: Secondary | ICD-10-CM

## 2018-07-22 DIAGNOSIS — Z9109 Other allergy status, other than to drugs and biological substances: Secondary | ICD-10-CM | POA: Diagnosis not present

## 2018-07-22 DIAGNOSIS — M79602 Pain in left arm: Secondary | ICD-10-CM

## 2018-07-22 DIAGNOSIS — R7989 Other specified abnormal findings of blood chemistry: Secondary | ICD-10-CM

## 2018-07-22 DIAGNOSIS — Z1231 Encounter for screening mammogram for malignant neoplasm of breast: Secondary | ICD-10-CM

## 2018-07-22 DIAGNOSIS — Z1239 Encounter for other screening for malignant neoplasm of breast: Secondary | ICD-10-CM

## 2018-07-22 DIAGNOSIS — J3489 Other specified disorders of nose and nasal sinuses: Secondary | ICD-10-CM

## 2018-07-22 NOTE — Progress Notes (Signed)
Patient ID: Jamie Elliott, female   DOB: 04-17-1946, 72 y.o.   MRN: [id number]   Subjective:    Patient ID: Jamie Elliott, female    DOB: August 04, 1946, 72 y.o.   MRN: [id number]  HPI  Patient here for a scheduled follow up.  She was recently treated for sinus infection.  Doing better.  Symptoms resolved.  Persistent lesion - bridge of nose.  Needs referral to dermatology.  Has noticed left arm pain.  Has had a couple of intermittent episodes.  No chest pain.  States the left arm pain is not related to movement.  Not sore to touch.  Breathing stable.  No acid reflux.  No abdominal pain.  Bowels moving.  Sees Dr Nehemiah Massed.     Past Medical History:  Diagnosis Date  . Environmental allergies   . Hypercholesterolemia   . Hypertension   . Migraines    Past Surgical History:  Procedure Laterality Date  . ABDOMINAL HYSTERECTOMY  2000  . APPENDECTOMY  2000  . NOSE SURGERY  1967  . SEPTOPLASTY    . SHOULDER ARTHROSCOPY WITH OPEN ROTATOR CUFF REPAIR Right 12/05/2015   Procedure: SHOULDER ARTHROSCOPY WITH MINI OPEN ROTATOR CUFF REPAIR. DISTAL CLAVICLE EXCISION;  Surgeon: Thornton Park, MD;  Location: [facility] ORS;  Service: Orthopedics;  Laterality: Right;   Family History  Problem Relation Age of Onset  . Heart disease Mother   . Hyperlipidemia Mother   . Hypertension Mother   . Cancer Father        lung, prostate  . Hyperlipidemia Father   . Hypertension Father   . Prostate cancer Father   . Bladder Cancer Neg Hx   . Kidney cancer Neg Hx    Social History   Socioeconomic History  . Marital status: Married    Spouse name: Not on file  . Number of children: 2  . Years of education: Not on file  . Highest education level: Not on file  Occupational History  . Not on file  Social Needs  . Financial resource strain: Not on file  . Food insecurity:    Worry: Not on file    Inability: Not on file  . Transportation needs:    Medical: Not on file    Non-medical: Not on file  Tobacco  Use  . Smoking status: Never Smoker  . Smokeless tobacco: Never Used  Substance and Sexual Activity  . Alcohol use: No    Alcohol/week: 0.0 oz  . Drug use: No  . Sexual activity: Not Currently  Lifestyle  . Physical activity:    Days per week: Not on file    Minutes per session: Not on file  . Stress: Not on file  Relationships  . Social connections:    Talks on phone: Not on file    Gets together: Not on file    Attends religious service: Not on file    Active member of club or organization: Not on file    Attends meetings of clubs or organizations: Not on file    Relationship status: Not on file  Other Topics Concern  . Not on file  Social History Narrative  . Not on file    Outpatient Encounter Medications as of 07/22/2018  Medication Sig  . acetaminophen (TYLENOL) 500 MG tablet Take 500 mg by mouth every 6 (six) hours as needed.  Marland Kitchen amLODipine (NORVASC) 2.5 MG tablet Take 1 tablet (2.5 mg total) by mouth daily.  Marland Kitchen aspirin 325 MG EC tablet  Take 325 mg by mouth daily.  Marland Kitchen atorvastatin (LIPITOR) 20 MG tablet Take 1 tablet (20 mg total) daily by mouth.  . cetirizine (ZYRTEC) 10 MG tablet Take 1 tablet (10 mg total) by mouth daily.  . Coenzyme Q10 (CO Q 10 PO) Take by mouth 2 (two) times daily.  Marland Kitchen losartan-hydrochlorothiazide (HYZAAR) 100-12.5 MG tablet Take 1 tablet by mouth daily.  . Probiotic Product (ALIGN PO) Take by mouth as needed.  . triamcinolone (NASACORT) 55 MCG/ACT nasal inhaler Place 2 sprays into the nose as needed.  . [DISCONTINUED] doxycycline (VIBRA-TABS) 100 MG tablet Take 1 tablet (100 mg total) by mouth 2 (two) times daily. With food  . [DISCONTINUED] Hypertonic Nasal Wash (SINUS RINSE NA) Place into the nose daily.  . [DISCONTINUED] ondansetron (ZOFRAN) 4 MG tablet Take 1 tablet (4 mg total) by mouth every 8 (eight) hours as needed for nausea or vomiting.  . conjugated estrogens (PREMARIN) vaginal cream Place 1 Applicatorful vaginally daily. Use pea sized  amount M-W-Fr before bedtime (Patient not taking: Reported on 07/22/2018)   No facility-administered encounter medications on file as of 07/22/2018.     Review of Systems  Constitutional: Negative for appetite change and unexpected weight change.  HENT: Negative for congestion and sinus pressure.   Respiratory: Negative for cough, chest tightness and shortness of breath.   Cardiovascular: Negative for chest pain, palpitations and leg swelling.       Left arm pain as outlined.    Gastrointestinal: Negative for abdominal pain, diarrhea, nausea and vomiting.  Genitourinary: Negative for difficulty urinating and dysuria.  Musculoskeletal: Negative for joint swelling and myalgias.  Skin: Negative for color change and rash.  Neurological: Negative for dizziness, light-headedness and headaches.  Psychiatric/Behavioral: Negative for agitation and dysphoric mood.       Objective:    Physical Exam  Constitutional: She appears well-developed and well-nourished. No distress.  HENT:  Nose: Nose normal.  Mouth/Throat: Oropharynx is clear and moist.  Neck: Neck supple. No thyromegaly present.  Cardiovascular: Normal rate and regular rhythm.  Pulmonary/Chest: Breath sounds normal. No respiratory distress. She has no wheezes.  Abdominal: Soft. Bowel sounds are normal. There is no tenderness.  Musculoskeletal: She exhibits no edema or tenderness.  Lymphadenopathy:    She has no cervical adenopathy.  Skin: No rash noted. No erythema.  Psychiatric: She has a normal mood and affect. Her behavior is normal.    BP 120/70   Pulse 73   Temp 97.8 F (36.6 C) (Oral)   Resp 17   Ht 5\' 2"  (1.575 m)   Wt 120 lb 2 oz (54.5 kg)   LMP 11/28/1998   SpO2 99%   BMI 21.97 kg/m  Wt Readings from Last 3 Encounters:  07/22/18 120 lb 2 oz (54.5 kg)  06/10/18 121 lb 3.2 oz (55 kg)  06/03/18 119 lb 6.4 oz (54.2 kg)     Lab Results  Component Value Date   WBC 6.6 07/20/2018   HGB 12.9 07/20/2018   HCT  38.2 07/20/2018   PLT 224.0 07/20/2018   GLUCOSE 76 07/20/2018   CHOL 161 07/20/2018   TRIG 110.0 07/20/2018   HDL 62.50 07/20/2018   LDLDIRECT 103.0 05/21/2015   LDLCALC 76 07/20/2018   ALT 19 07/20/2018   AST 19 07/20/2018   NA 141 07/20/2018   K 3.5 07/20/2018   CL 105 07/20/2018   CREATININE 0.82 07/20/2018   BUN 17 07/20/2018   CO2 30 07/20/2018   TSH 1.97 07/20/2018  INR 1.00 11/25/2015    Dg Abd 1 View  Result Date: 01/11/2018 CLINICAL DATA:  Low back pain and urinary tract infections for the past 2 months. No nausea, vomiting, or fever. EXAM: ABDOMEN - 1 VIEW COMPARISON:  None in PACs FINDINGS: The colonic stool burden is mildly increased. There no small or large bowel obstructive pattern. There are no abnormal soft tissue calcifications. There are pelvic phleboliths. There is gentle levocurvature centered in the mid lumbar spine. Mild degenerative disc disease is noted in the upper and lower lumbar spine. IMPRESSION: Mildly increased colonic stool burden may reflect constipation in the appropriate clinical setting. No acute intra-abdominal abnormality is observed. Mild degenerative disc change with scoliosis of the lumbar spine. Electronically Signed   By: David  Martinique M.D.   On: 01/11/2018 11:31       Assessment & Plan:   Problem List Items Addressed This Visit    Abnormal liver function tests    Follow liver panel.        Environmental allergies    Controlled on current regimen.  Previous sinus infection.  Resolved.        Hypercholesterolemia    On lipitor.  Low cholesterol diet and exercise.  Follow lipid panel and liver function tests.        Relevant Orders   Hepatic function panel   Lipid panel   Hypertension    Blood pressure under good control.  Continue same medication regimen.  Follow pressures.  Follow metabolic panel.        Relevant Orders   Basic metabolic panel   Left arm pain    Few intermittent episodes.  No chest pain.  No pain with  movement.  No pain with palpation.  EKG - SR with no acute ischemic changes.  Sees Dr Nehemiah Massed.  Will arrange earlier appt to confirm no further cardiac w/up warranted.  Continue risk factor modification.        Relevant Orders   EKG 12-Lead (Completed)    Other Visit Diagnoses    Breast cancer screening    -  Primary   Relevant Orders   MM 3D SCREEN BREAST BILATERAL   Nasal lesion       Persistent lesion.  Refer to Dr Evorn Gong.     Relevant Orders   Ambulatory referral to Dermatology       Einar Pheasant, MD

## 2018-07-22 NOTE — Telephone Encounter (Signed)
Unread mychart message mailed to patient 

## 2018-07-24 ENCOUNTER — Encounter: Payer: Self-pay | Admitting: Internal Medicine

## 2018-07-24 DIAGNOSIS — M79602 Pain in left arm: Secondary | ICD-10-CM | POA: Insufficient documentation

## 2018-07-24 NOTE — Assessment & Plan Note (Signed)
Blood pressure under good control.  Continue same medication regimen.  Follow pressures.  Follow metabolic panel.   

## 2018-07-24 NOTE — Assessment & Plan Note (Signed)
Controlled on current regimen.  Previous sinus infection.  Resolved.

## 2018-07-24 NOTE — Assessment & Plan Note (Signed)
Follow liver panel.  

## 2018-07-24 NOTE — Assessment & Plan Note (Signed)
On lipitor.  Low cholesterol diet and exercise.  Follow lipid panel and liver function tests.   

## 2018-07-24 NOTE — Assessment & Plan Note (Signed)
Few intermittent episodes.  No chest pain.  No pain with movement.  No pain with palpation.  EKG - SR with no acute ischemic changes.  Sees Dr Nehemiah Massed.  Will arrange earlier appt to confirm no further cardiac w/up warranted.  Continue risk factor modification.

## 2018-07-25 ENCOUNTER — Other Ambulatory Visit: Payer: Self-pay | Admitting: Internal Medicine

## 2018-07-25 ENCOUNTER — Encounter (INDEPENDENT_AMBULATORY_CARE_PROVIDER_SITE_OTHER): Payer: Self-pay

## 2018-08-04 DIAGNOSIS — E7800 Pure hypercholesterolemia, unspecified: Secondary | ICD-10-CM | POA: Diagnosis not present

## 2018-08-04 DIAGNOSIS — I10 Essential (primary) hypertension: Secondary | ICD-10-CM | POA: Diagnosis not present

## 2018-08-04 DIAGNOSIS — R0789 Other chest pain: Secondary | ICD-10-CM | POA: Diagnosis not present

## 2018-08-11 ENCOUNTER — Other Ambulatory Visit: Payer: Self-pay | Admitting: Internal Medicine

## 2018-09-01 DIAGNOSIS — Z1231 Encounter for screening mammogram for malignant neoplasm of breast: Secondary | ICD-10-CM | POA: Diagnosis not present

## 2018-09-01 LAB — HM MAMMOGRAPHY

## 2018-09-20 DIAGNOSIS — L720 Epidermal cyst: Secondary | ICD-10-CM | POA: Diagnosis not present

## 2018-09-20 DIAGNOSIS — L814 Other melanin hyperpigmentation: Secondary | ICD-10-CM | POA: Diagnosis not present

## 2018-09-20 DIAGNOSIS — L821 Other seborrheic keratosis: Secondary | ICD-10-CM | POA: Diagnosis not present

## 2018-10-03 ENCOUNTER — Ambulatory Visit: Payer: Medicare Other

## 2018-10-04 ENCOUNTER — Ambulatory Visit (INDEPENDENT_AMBULATORY_CARE_PROVIDER_SITE_OTHER): Payer: Medicare Other

## 2018-10-04 ENCOUNTER — Other Ambulatory Visit: Payer: Self-pay | Admitting: Internal Medicine

## 2018-10-04 VITALS — BP 122/78 | HR 68 | Temp 98.2°F | Resp 14 | Ht 61.5 in | Wt 120.8 lb

## 2018-10-04 DIAGNOSIS — Z23 Encounter for immunization: Secondary | ICD-10-CM

## 2018-10-04 DIAGNOSIS — Z0000 Encounter for general adult medical examination without abnormal findings: Secondary | ICD-10-CM | POA: Diagnosis not present

## 2018-10-04 NOTE — Patient Instructions (Addendum)
  Jamie Elliott , Thank you for taking time to come for your Medicare Wellness Visit. I appreciate your ongoing commitment to your health goals. Please review the following plan we discussed and let me know if I can assist you in the future.   These are the goals we discussed: Goals    . Increase physical activity     Stretch before and after exercising. Rest between activities.        This is a list of the screening recommended for you and due dates:  Health Maintenance  Topic Date Due  . Mammogram  07/28/2018  . Pneumonia vaccines (2 of 2 - PCV13) 10/20/2018*  . Colon Cancer Screening  12/10/2021  . Tetanus Vaccine  01/11/2023  . Flu Shot  Completed  . DEXA scan (bone density measurement)  Completed  .  Hepatitis C: One time screening is recommended by Center for Disease Control  (CDC) for  adults born from [birth year] through 1965.   Completed  *Topic was postponed. The date shown is not the original due date.

## 2018-10-04 NOTE — Progress Notes (Signed)
Subjective:   Jamie Elliott is a 72 y.o. female who presents for Medicare Annual (Subsequent) preventive examination.  Review of Systems:  No ROS.  Medicare Wellness Visit. Additional risk factors are reflected in the social history. Cardiac Risk Factors include: advanced age (>59men, >59 women);hypertension      Objective:     Vitals: BP 122/78 (BP Location: Left Arm, Patient Position: Sitting, Cuff Size: Normal)   Pulse 68   Temp 98.2 F (36.8 C) (Oral)   Resp 14   Ht 5' 1.5" (1.562 m)   Wt 120 lb 12.8 oz (54.8 kg)   LMP 11/28/1998   SpO2 99%   BMI 22.46 kg/m   Body mass index is 22.46 kg/m.  Advanced Directives 10/04/2018 09/30/2017 09/30/2016 12/31/2015 12/05/2015 11/25/2015 09/30/2015  Does Patient Have a Medical Advance Directive? Yes Yes Yes Yes Yes Yes Yes  Type of Advance Directive Living will;Healthcare Power of [place];Living will [place];Living will - [place];Living will [place];Living will [place];Living will  Does patient want to make changes to medical advance directive? No - Patient declined No - Patient declined - - No - Patient declined - No - Patient declined  Copy of [place] in Chart? No - copy requested No - copy requested No - copy requested No - copy requested No - copy requested - No - copy requested    Tobacco Social History   Tobacco Use  Smoking Status Never Smoker  Smokeless Tobacco Never Used     Counseling given: Not Answered   Clinical Intake:  Pre-visit preparation completed: Yes  Pain : 0-10 Pain Score: 5  Pain Location: Hip(Low back pain) Pain Orientation: Right Pain Radiating Towards: Leg Pain Descriptors / Indicators: Discomfort, Aching Pain Onset: More than a month ago Pain Frequency: Intermittent Pain Relieving Factors: Rest. Tylenol PRN.  Effect of Pain on Daily Activities: She paces  herself.  Pain Relieving Factors: Rest. Tylenol PRN.   Nutritional Status: BMI of 19-24  Normal Diabetes: No  How often do you need to have someone help you when you read instructions, pamphlets, or other written materials from your doctor or pharmacy?: 1 - Never  Interpreter Needed?: No     Past Medical History:  Diagnosis Date  . Environmental allergies   . Hypercholesterolemia   . Hypertension   . Migraines    Past Surgical History:  Procedure Laterality Date  . ABDOMINAL HYSTERECTOMY  2000  . APPENDECTOMY  2000  . NOSE SURGERY  1967  . SEPTOPLASTY    . SHOULDER ARTHROSCOPY WITH OPEN ROTATOR CUFF REPAIR Right 12/05/2015   Procedure: SHOULDER ARTHROSCOPY WITH MINI OPEN ROTATOR CUFF REPAIR. DISTAL CLAVICLE EXCISION;  Surgeon: Thornton Park, MD;  Location: [facility] ORS;  Service: Orthopedics;  Laterality: Right;   Family History  Problem Relation Age of Onset  . Heart disease Mother   . Hyperlipidemia Mother   . Hypertension Mother   . Cancer Father        lung, prostate  . Hyperlipidemia Father   . Hypertension Father   . Prostate cancer Father   . Bladder Cancer Neg Hx   . Kidney cancer Neg Hx    Social History   Socioeconomic History  . Marital status: Married    Spouse name: Not on file  . Number of children: 2  . Years of education: Not on file  . Highest education level: Not on file  Occupational History  . Not on file  Social Needs  . Financial resource strain: Not on file  . Food insecurity:    Worry: Not on file    Inability: Not on file  . Transportation needs:    Medical: Not on file    Non-medical: Not on file  Tobacco Use  . Smoking status: Never Smoker  . Smokeless tobacco: Never Used  Substance and Sexual Activity  . Alcohol use: No    Alcohol/week: 0.0 standard drinks  . Drug use: No  . Sexual activity: Not Currently  Lifestyle  . Physical activity:    Days per week: Not on file    Minutes per session: Not on file  . Stress: Not on  file  Relationships  . Social connections:    Talks on phone: Not on file    Gets together: Not on file    Attends religious service: Not on file    Active member of club or organization: Not on file    Attends meetings of clubs or organizations: Not on file    Relationship status: Not on file  Other Topics Concern  . Not on file  Social History Narrative  . Not on file    Outpatient Encounter Medications as of 10/04/2018  Medication Sig  . acetaminophen (TYLENOL) 500 MG tablet Take 500 mg by mouth every 6 (six) hours as needed.  Marland Kitchen amLODipine (NORVASC) 2.5 MG tablet Take 1 tablet (2.5 mg total) by mouth daily.  Marland Kitchen aspirin 325 MG EC tablet Take 325 mg by mouth daily.  Marland Kitchen atorvastatin (LIPITOR) 20 MG tablet Take 1 tablet (20 mg total) daily by mouth.  . cetirizine (ZYRTEC) 10 MG tablet Take 1 tablet (10 mg total) by mouth daily.  Marland Kitchen CRANBERRY EXTRACT PO Take 1 capsule by mouth 2 (two) times daily.  Marland Kitchen losartan-hydrochlorothiazide (HYZAAR) 100-12.5 MG tablet Take 1 tablet by mouth daily.  . Probiotic Product (ALIGN PO) Take by mouth as needed.  . triamcinolone (NASACORT) 55 MCG/ACT nasal inhaler Place 2 sprays into the nose as needed.  . Coenzyme Q10 (CO Q 10 PO) Take by mouth 2 (two) times daily.  . [DISCONTINUED] conjugated estrogens (PREMARIN) vaginal cream Place 1 Applicatorful vaginally daily. Use pea sized amount M-W-Fr before bedtime (Patient not taking: Reported on 07/22/2018)   No facility-administered encounter medications on file as of 10/04/2018.     Activities of Daily Living In your present state of health, do you have any difficulty performing the following activities: 10/04/2018  Hearing? N  Vision? N  Difficulty concentrating or making decisions? N  Walking or climbing stairs? N  Dressing or bathing? N  Doing errands, shopping? N  Preparing Food and eating ? N  Using the Toilet? N  In the past six months, have you accidently leaked urine? N  Do you have problems  with loss of bowel control? N  Managing your Medications? N  Managing your Finances? N  Housekeeping or managing your Housekeeping? N  Some recent data might be hidden    Patient Care Team: Einar Pheasant, MD as PCP - General (Internal Medicine)    Assessment:   This is a routine wellness examination for [facility]. The goal of the wellness visit is to assist the patient how to close the gaps in care and create a preventative care plan for the patient.   Over all feels good. States Lower back and R leg pain has increased. Seems to be more painful following exercise. Stiffens when  standing or sitting for a long period. Pain rated today 5 out of 10 on the pain scale, states some days as a 9.  Follow up appointment to be scheduled after visit.    Osteoporosis  reviewed.   Safety issues reviewed; Smoke and carbon monoxide detectors in the home. No firearms in the home. Wears seatbelts when driving or riding with others. No violence in the home.  They do not have excessive sun exposure.  Discussed the need for sun protection: hats, long sleeves and the use of sunscreen if there is significant sun exposure.  No new identified risk were noted.  No failures at ADL's or IADL's.    BMI- discussed the importance of a healthy diet, water intake and the benefits of aerobic exercise.   24 hour diet recall: Regular diet  Dental- every 6 months.  Eye- Visual acuity not assessed per patient preference since they have regular follow up with the ophthalmologist.  Wears corrective lenses.  Sleep patterns- Sleeps well through the night.   High dose influenza administered L deltoid, tolerated well. Educational material provided.  Health maintenance gaps- closed.  Patient Concerns: None at this time. Follow up with PCP as needed.  Exercise Activities and Dietary recommendations Current Exercise Habits: Home exercise routine, Type of exercise: walking, Time (Minutes): 35, Frequency (Times/Week):  3, Weekly Exercise (Minutes/Week): 105, Intensity: Moderate  Goals    . Increase physical activity     Stretch before and after exercising. Rest between activities.        Fall Risk Fall Risk  10/04/2018 06/10/2018 09/30/2017 10/28/2016 09/30/2016  Falls in the past year? No No No No No  Number falls in past yr: - - - - -  Injury with Fall? - - - - -  Comment - - - - -  Follow up - - - - -   Depression Screen PHQ 2/9 Scores 10/04/2018 06/10/2018 09/30/2017 10/28/2016  PHQ - 2 Score 0 0 0 0  PHQ- 9 Score - - 0 -     Cognitive Function MMSE - Mini Mental State Exam 10/04/2018 09/30/2017 09/30/2016 09/30/2015  Orientation to time 5 5 5 5   Orientation to Place 5 5 5 5   Registration 3 3 3 3   Attention/ Calculation 5 5 5 5   Recall 3 3 3 3   Language- name 2 objects 2 2 2 2   Language- repeat 1 1 1 1   Language- follow 3 step command 3 3 3 3   Language- read & follow direction 1 1 1 1   Write a sentence 1 1 1 1   Copy design 1 1 1 1   Total score 30 30 30 30         Immunization History  Administered Date(s) Administered  . Influenza Split 10/27/2012  . Influenza, High Dose Seasonal PF 09/30/2016, 10/04/2018  . Influenza,inj,Quad PF,6+ Mos 08/16/2014, 08/20/2015  . Influenza,inj,quad, With Preservative 10/20/2017  . Pneumococcal Polysaccharide-23 06/05/2013  . Tdap 01/11/2013  . Zoster 06/04/2011   Screening Tests Health Maintenance  Topic Date Due  . MAMMOGRAM  07/28/2018  . PNA vac Low Risk Adult (2 of 2 - PCV13) 10/20/2018 (Originally 06/05/2014)  . COLONOSCOPY  12/10/2021  . TETANUS/TDAP  01/11/2023  . INFLUENZA VACCINE  Completed  . DEXA SCAN  Completed  . Hepatitis C Screening  Completed       Plan:    End of life planning; Advance aging; Advanced directives discussed. Copy of current HCPOA/Living Will requested.    I have personally reviewed  and noted the following in the patient's chart:   . Medical and social history . Use of alcohol, tobacco or illicit  drugs  . Current medications and supplements . Functional ability and status . Nutritional status . Physical activity . Advanced directives . List of other physicians . Hospitalizations, surgeries, and ER visits in previous 12 months . Vitals . Screenings to include cognitive, depression, and falls . Referrals and appointments  In addition, I have reviewed and discussed with patient certain preventive protocols, quality metrics, and best practice recommendations. A written personalized care plan for preventive services as well as general preventive health recommendations were provided to patient.     Varney Biles, LPN  51/83/3582   Reviewed above information.  Agree with assessment and plan.    Dr Nicki Reaper

## 2018-10-06 ENCOUNTER — Ambulatory Visit (INDEPENDENT_AMBULATORY_CARE_PROVIDER_SITE_OTHER): Payer: Medicare Other | Admitting: Internal Medicine

## 2018-10-06 ENCOUNTER — Encounter: Payer: Self-pay | Admitting: Internal Medicine

## 2018-10-06 VITALS — BP 108/72 | HR 79 | Temp 98.2°F | Resp 18 | Wt 121.4 lb

## 2018-10-06 DIAGNOSIS — I10 Essential (primary) hypertension: Secondary | ICD-10-CM

## 2018-10-06 DIAGNOSIS — R945 Abnormal results of liver function studies: Secondary | ICD-10-CM

## 2018-10-06 DIAGNOSIS — Z9109 Other allergy status, other than to drugs and biological substances: Secondary | ICD-10-CM

## 2018-10-06 DIAGNOSIS — M545 Low back pain, unspecified: Secondary | ICD-10-CM

## 2018-10-06 DIAGNOSIS — E7800 Pure hypercholesterolemia, unspecified: Secondary | ICD-10-CM

## 2018-10-06 DIAGNOSIS — M25551 Pain in right hip: Secondary | ICD-10-CM

## 2018-10-06 DIAGNOSIS — R7989 Other specified abnormal findings of blood chemistry: Secondary | ICD-10-CM

## 2018-10-06 NOTE — Progress Notes (Signed)
Patient ID: Jamie Elliott, female   DOB: 1946/04/04, 72 y.o.   MRN: [id number]   Subjective:    Patient ID: Jamie Elliott, female    DOB: 12/22/1945, 72 y.o.   MRN: [id number]  HPI  Patient here for a work in with concerns regarding right hip and leg pain.   Lying on her right side aggravates.  When she has to sit for long periods and then gets up, some increased discomfort.  Once she gets moving is better.  Was exercising.  Stopped over the last week.  Feels is better.  Has been bothering her for the last several months.  Worsened some recently.  After walking, her right calf will feel tight.  No swelling.  No increased erythema or warmth.  No known injury.  Eating well.  No nausea or vomiting.  No abdominal pain.  No chest pain or tightness. No sob.     Past Medical History:  Diagnosis Date  . Environmental allergies   . Hypercholesterolemia   . Hypertension   . Migraines    Past Surgical History:  Procedure Laterality Date  . ABDOMINAL HYSTERECTOMY  2000  . APPENDECTOMY  2000  . NOSE SURGERY  1967  . SEPTOPLASTY    . SHOULDER ARTHROSCOPY WITH OPEN ROTATOR CUFF REPAIR Right 12/05/2015   Procedure: SHOULDER ARTHROSCOPY WITH MINI OPEN ROTATOR CUFF REPAIR. DISTAL CLAVICLE EXCISION;  Surgeon: Thornton Park, MD;  Location: [facility] ORS;  Service: Orthopedics;  Laterality: Right;   Family History  Problem Relation Age of Onset  . Heart disease Mother   . Hyperlipidemia Mother   . Hypertension Mother   . Cancer Father        lung, prostate  . Hyperlipidemia Father   . Hypertension Father   . Prostate cancer Father   . Bladder Cancer Neg Hx   . Kidney cancer Neg Hx    Social History   Socioeconomic History  . Marital status: Married    Spouse name: Not on file  . Number of children: 2  . Years of education: Not on file  . Highest education level: Not on file  Occupational History  . Not on file  Social Needs  . Financial resource strain: Not on file  . Food insecurity:   Worry: Not on file    Inability: Not on file  . Transportation needs:    Medical: Not on file    Non-medical: Not on file  Tobacco Use  . Smoking status: Never Smoker  . Smokeless tobacco: Never Used  Substance and Sexual Activity  . Alcohol use: No    Alcohol/week: 0.0 standard drinks  . Drug use: No  . Sexual activity: Not Currently  Lifestyle  . Physical activity:    Days per week: Not on file    Minutes per session: Not on file  . Stress: Not on file  Relationships  . Social connections:    Talks on phone: Not on file    Gets together: Not on file    Attends religious service: Not on file    Active member of club or organization: Not on file    Attends meetings of clubs or organizations: Not on file    Relationship status: Not on file  Other Topics Concern  . Not on file  Social History Narrative  . Not on file    Outpatient Encounter Medications as of 10/06/2018  Medication Sig  . acetaminophen (TYLENOL) 500 MG tablet Take 500 mg by mouth every  6 (six) hours as needed.  Marland Kitchen amLODipine (NORVASC) 2.5 MG tablet Take 1 tablet (2.5 mg total) by mouth daily.  Marland Kitchen aspirin 325 MG EC tablet Take 325 mg by mouth daily.  Marland Kitchen atorvastatin (LIPITOR) 20 MG tablet Take 1 tablet (20 mg total) daily by mouth.  . cetirizine (ZYRTEC) 10 MG tablet Take 1 tablet (10 mg total) by mouth daily.  . Coenzyme Q10 (CO Q 10 PO) Take by mouth 2 (two) times daily.  Marland Kitchen CRANBERRY EXTRACT PO Take 1 capsule by mouth 2 (two) times daily.  Marland Kitchen losartan-hydrochlorothiazide (HYZAAR) 100-12.5 MG tablet Take 1 tablet by mouth daily.  . Probiotic Product (ALIGN PO) Take by mouth as needed.  . triamcinolone (NASACORT) 55 MCG/ACT nasal inhaler Place 2 sprays into the nose as needed.   No facility-administered encounter medications on file as of 10/06/2018.     Review of Systems  Constitutional: Negative for appetite change and unexpected weight change.  Respiratory: Negative for cough, chest tightness and  shortness of breath.   Cardiovascular: Negative for chest pain and leg swelling.  Gastrointestinal: Negative for abdominal pain, nausea and vomiting.  Genitourinary: Negative for difficulty urinating and dysuria.  Musculoskeletal:       Right hip and leg pain as outlined.  Some left lower back pain at times.  Worse if sitting for a long period and first get up.  Better once she starts moving.  Better now since backed off exercise over the last week.    Skin: Negative for color change and rash.  Neurological: Negative for dizziness, light-headedness and headaches.  Psychiatric/Behavioral: Negative for agitation and dysphoric mood.       Objective:    Physical Exam  Constitutional: She appears well-developed and well-nourished. No distress.  HENT:  Nose: Nose normal.  Mouth/Throat: Oropharynx is clear and moist.  Neck: Neck supple. No thyromegaly present.  Cardiovascular: Normal rate and regular rhythm.  Pulmonary/Chest: Breath sounds normal. No respiratory distress. She has no wheezes.  Abdominal: Soft. Bowel sounds are normal. There is no tenderness.  Musculoskeletal: She exhibits no edema or tenderness.  Lymphadenopathy:    She has no cervical adenopathy.  Skin: No rash noted. No erythema.  Psychiatric: She has a normal mood and affect. Her behavior is normal.    BP 108/72 (BP Location: Left Arm, Patient Position: Sitting, Cuff Size: Normal)   Pulse 79   Temp 98.2 F (36.8 C) (Oral)   Resp 18   Wt 121 lb 6.4 oz (55.1 kg)   LMP 11/28/1998   SpO2 96%   BMI 22.57 kg/m  Wt Readings from Last 3 Encounters:  10/06/18 121 lb 6.4 oz (55.1 kg)  10/04/18 120 lb 12.8 oz (54.8 kg)  07/22/18 120 lb 2 oz (54.5 kg)     Lab Results  Component Value Date   WBC 6.6 07/20/2018   HGB 12.9 07/20/2018   HCT 38.2 07/20/2018   PLT 224.0 07/20/2018   GLUCOSE 76 07/20/2018   CHOL 161 07/20/2018   TRIG 110.0 07/20/2018   HDL 62.50 07/20/2018   LDLDIRECT 103.0 05/21/2015   LDLCALC 76  07/20/2018   ALT 19 07/20/2018   AST 19 07/20/2018   NA 141 07/20/2018   K 3.5 07/20/2018   CL 105 07/20/2018   CREATININE 0.82 07/20/2018   BUN 17 07/20/2018   CO2 30 07/20/2018   TSH 1.97 07/20/2018   INR 1.00 11/25/2015    Dg Abd 1 View  Result Date: 01/11/2018 CLINICAL DATA:  Low back  pain and urinary tract infections for the past 2 months. No nausea, vomiting, or fever. EXAM: ABDOMEN - 1 VIEW COMPARISON:  None in PACs FINDINGS: The colonic stool burden is mildly increased. There no small or large bowel obstructive pattern. There are no abnormal soft tissue calcifications. There are pelvic phleboliths. There is gentle levocurvature centered in the mid lumbar spine. Mild degenerative disc disease is noted in the upper and lower lumbar spine. IMPRESSION: Mildly increased colonic stool burden may reflect constipation in the appropriate clinical setting. No acute intra-abdominal abnormality is observed. Mild degenerative disc change with scoliosis of the lumbar spine. Electronically Signed   By: David  Martinique M.D.   On: 01/11/2018 11:31       Assessment & Plan:   Problem List Items Addressed This Visit    Abnormal liver function tests    Follow liver panel.        Environmental allergies    Controlled.        Hypercholesterolemia    On lipitor.  Low cholesterol diet and exercise.  Follow lipid panel and liver function tests.        Hypertension    Blood pressure under good control.  Continue same medication regimen.  Follow pressures.  Follow metabolic panel.        Right hip pain - Primary    Right hip and leg pain as outlined.  Is some better since she has cut back on exercise.  Refer to physical therapy for evaluation and treatment.  If persistent problems, will need xray.        Relevant Orders   Ambulatory referral to Physical Therapy    Other Visit Diagnoses    Right-sided low back pain without sciatica, unspecified chronicity       Relevant Orders   Ambulatory  referral to Physical Therapy       Einar Pheasant, MD

## 2018-10-09 ENCOUNTER — Encounter: Payer: Self-pay | Admitting: Internal Medicine

## 2018-10-09 NOTE — Assessment & Plan Note (Signed)
Controlled.  

## 2018-10-09 NOTE — Assessment & Plan Note (Signed)
Right hip and leg pain as outlined.  Is some better since she has cut back on exercise.  Refer to physical therapy for evaluation and treatment.  If persistent problems, will need xray.

## 2018-10-09 NOTE — Assessment & Plan Note (Signed)
Blood pressure under good control.  Continue same medication regimen.  Follow pressures.  Follow metabolic panel.   

## 2018-10-09 NOTE — Assessment & Plan Note (Signed)
On lipitor.  Low cholesterol diet and exercise.  Follow lipid panel and liver function tests.   

## 2018-10-09 NOTE — Assessment & Plan Note (Signed)
Follow liver panel.  

## 2018-10-24 ENCOUNTER — Encounter: Payer: Self-pay | Admitting: Physical Therapy

## 2018-10-24 ENCOUNTER — Ambulatory Visit: Payer: Medicare Other | Attending: Internal Medicine | Admitting: Physical Therapy

## 2018-10-24 DIAGNOSIS — M25551 Pain in right hip: Secondary | ICD-10-CM | POA: Insufficient documentation

## 2018-10-24 DIAGNOSIS — M6281 Muscle weakness (generalized): Secondary | ICD-10-CM | POA: Insufficient documentation

## 2018-10-24 DIAGNOSIS — G8929 Other chronic pain: Secondary | ICD-10-CM | POA: Diagnosis not present

## 2018-10-24 DIAGNOSIS — M5441 Lumbago with sciatica, right side: Secondary | ICD-10-CM | POA: Diagnosis not present

## 2018-10-24 NOTE — Therapy (Signed)
[facility] [address]. [place], [place], [address] Phone: [phone]   Fax:  [phone]  Physical Therapy Evaluation  Patient Details  Name: ADIANA SMELCER MRN: [id number] Date of Birth: [birth year] Referring Provider (PT): Dr. Nicki Reaper   Encounter Date: 10/24/2018  PT End of Session - 10/25/18 1741    Visit Number  1    Number of Visits  4    Date for PT Re-Evaluation  11/22/18    PT Start Time  0938    PT Stop Time  1039    PT Time Calculation (min)  61 min    Activity Tolerance  Patient tolerated treatment well    Behavior During Therapy  [facility] for tasks assessed/performed       Past Medical History:  Diagnosis Date  . Environmental allergies   . Hypercholesterolemia   . Hypertension   . Migraines     Past Surgical History:  Procedure Laterality Date  . ABDOMINAL HYSTERECTOMY  2000  . APPENDECTOMY  2000  . NOSE SURGERY  1967  . SEPTOPLASTY    . SHOULDER ARTHROSCOPY WITH OPEN ROTATOR CUFF REPAIR Right 12/05/2015   Procedure: SHOULDER ARTHROSCOPY WITH MINI OPEN ROTATOR CUFF REPAIR. DISTAL CLAVICLE EXCISION;  Surgeon: Thornton Park, MD;  Location: [facility] ORS;  Service: Orthopedics;  Laterality: Right;    There were no vitals filed for this visit.   Subjective Assessment - 10/25/18 0855    Subjective  Pt. is pleasant 72 y.o. female seeking therapy for LBP that is radiating towards her R hip.  Pt. notes this has been an ongoing issue since April 2019.  Pt. notes no direct mechanism of injury, however, she attributes it to exercising too much.  Pt. believe it may be some form of a muscle strain.  Pt. currently presents with no pain upon arrival to the clinic, however reports a worst pain level to be 5/10 when she is walking up stairs, an incline, or bending over to pick something up.  Pt. contributes it to either a muscle strain or arthritis.      Limitations  Sitting;Standing;Walking;House hold activities    Currently in  Pain?  No/denies    Pain Score  0-No pain    Pain Location  Hip    Pain Orientation  Right    Pain Onset  More than a month ago         [facility] PT Assessment - 10/25/18 0001      Assessment   Medical Diagnosis  LBP with sciatica    Referring Provider (PT)  Dr. Nicki Reaper    Onset Date/Surgical Date  03/21/18    Hand Dominance  Left    Next MD Visit  12/09/2018    Prior Therapy  No      Balance Screen   Has the patient fallen in the past 6 months  No    Has the patient had a decrease in activity level because of a fear of falling?   No      Home Environment   Living Environment  Private residence      Prior Function   Level of [place] Chief complaint:   Onset: 03/21/2018 Referring Dx: LBP with R Hip Pain MD: Dr. Nicki Reaper Pain: 0/10 Present, 0/10 Best, 5/10 Worst: Aggravating factors: Going up stairs, Bending over to pick up something, Walking on an incline Easing factors: Getting out of the position, Tylenol for pain  relief 24 hour pain behavior: Worse in the morning most of the time How long can you sit: N/A How long can you stand: 1 hr. How long can you walk: N/A on flat surfaces, inclines are difficult Recent back trauma: No Prior history of back injury or pain: No Pain quality: pain quality: pressure, aching Radiating pain: Yes, to R ITB area Numbness/Tingling: No Follow-up appointment with MD: Yes, 12/09/2018 Dominant hand: left  Imaging: No     OBJECTIVE  Mental Status Patient is oriented to person, place and time.  Recent memory is intact.  Remote memory is intact.  Attention span and concentration are intact.  Expressive speech is intact.  Patient's fund of knowledge is within normal limits for educational level.  SENSATION: Grossly intact to light touch bilateral L as determined by testing dermatomes L2-S2 Proprioception and hot/cold testing deferred on this date   MUSCULOSKELETAL: Tremor: None Bulk: Normal Tone:  Normal    Strength (out of 5) R/L 4/4- Hip flexion 5/5 Hip abduction 5/5 Hip adduction 4/4- Hip extension 5/5 Knee extension 4/4 Knee flexion 5/5 Ankle dorsiflexion *Indicates pain   Repeated Movements No centralization or peripheralization of symptoms with repeated lumbar extension or flexion.    Muscle Length Hamstrings: R: degrees L: degrees  Ely: R: degrees L: degrees Ober: R: Negative L: Negative   Passive Accessory Intervertebral Motion (PAIVM) Pt denies reproduction of posterior hip pain with CPA L1-L5 and UPA bilaterally L1-L5. Generally hypomobile throughout  Passive Physiological Intervertebral Motion (PPIVM) Normal flexion and extension with PPIVM testing   SPECIAL TESTS Lumbar quadrant: R: Not examined L: Not examined Slump: R: Negative L: Negative SLR: R: Negative L:  Negative Crossed SLR: R: Negative L: Negative FABER: R: Negative L: Negative FADIR: R: Negative L: Negative  Hip scour: R: Positive L: Negative Ely: R: Negative L: Negative Thomas: R: Negative L: Negative Ober: R: Negative L: Negative Forward Step-Down Test: R: Not examined L: Not examined Lateral Step-Down Test: R: Not examined L: Not examined Deep Squat: Not examined    ASSESSMENT Clinical Impression: Pt is a pleasant 72 year-old female referred for low back pain. PT examination reveals deficits with hip flexion strength (R/L:4/4-), hip extension strength (R/L:4/4-) and knee flexion strength (R/L:4/4).  Pt. Also demonstrates positive Scour's test on the R hip. Pt presents with deficits in strength, mobility, range of motion, and pain. Pt will benefit from skilled PT services to address deficits and return to pain-free function at home.      See HEP   PT Education - 10/25/18 1741    Education Details  Pt. instructed on importance of HEP and being active in the gym.    Person(s) Educated  Patient    Methods  Explanation;Demonstration;Tactile cues;Verbal cues;Handout    Comprehension   Verbalized understanding;Returned demonstration          PT Long Term Goals - 10/25/18 1754      PT LONG TERM GOAL #1   Title  Pt. will complete FOTO and improve to 70 to improve daily functional mobility.    Baseline  FOTO: 63    Time  4    Period  Weeks    Status  New    Target Date  11/21/18      PT LONG TERM GOAL #2   Title  Pt will decrease worst back pain as reported on NPRS by at least 2 points in order to demonstrate clinically significant reduction in back pain.     Baseline  Worst Back Pain: 5/10    Time  4    Period  Weeks    Status  New    Target Date  11/21/18      PT LONG TERM GOAL #3   Title  Pt will increase strength of by at least 1/2 MMT grade in order to demonstrate improvement in strength and function.    Baseline  Hip Flexion R/L: 4/4-, Extension R/L: 4/4-, Knee Flexion R/L: 4/4    Time  4    Period  Weeks    Status  New    Target Date  11/21/18         Plan - 10/25/18 1742    Clinical Impression Statement  Pt is a pleasant 73 year-old female referred for low back pain. PT examination reveals deficits with hip flexion strength (R/L:4/4-), hip extension strength (R/L:4/4-) and knee flexion strength (R/L:4/4).  Pt. Also demonstrates positive Scour's test on the R hip. Pt presents with deficits in strength, mobility, range of motion, and pain. Pt will benefit from skilled PT services to address deficits and return to pain-free function at home.     Clinical Presentation  Stable    Clinical Decision Making  Low    Rehab Potential  Good    PT Frequency  1x / week    PT Duration  4 weeks    PT Treatment/Interventions  Cryotherapy;Moist Heat;Gait training;Stair training;Functional mobility training;Therapeutic activities;Therapeutic exercise;Manual techniques;Neuromuscular re-education;Balance training;Passive range of motion;Dry needling;Taping;Vestibular;Joint Manipulations;Spinal Manipulations    PT Next Visit Plan  assess HEP compliance.    PT Home  Exercise Plan  see handouts.    Consulted and Agree with Plan of Care  Patient       Patient will benefit from skilled therapeutic intervention in order to improve the following deficits and impairments:  Hypomobility, Pain, Decreased strength, Decreased activity tolerance  Visit Diagnosis: Chronic bilateral low back pain with right-sided sciatica  Pain in right hip  Muscle weakness (generalized)     Problem List Patient Active Problem List   Diagnosis Date Noted  . Left arm pain 07/24/2018  . Right hip pain 05/24/2018  . Left leg swelling 09/03/2017  . Chest pressure 06/16/2017  . Altered sensation of foot 06/16/2017  . Recurrent UTI 05/12/2017  . Pain in joint, shoulder region 12/05/2015  . Loss of weight 11/25/2015  . History of abnormal mammogram 08/26/2015  . Health care maintenance 05/19/2015  . Osteoporosis 04/17/2014  . Abnormal liver function tests 12/17/2013  . Hypertension 12/04/2012  . Hypercholesterolemia 12/04/2012  . Environmental allergies 12/04/2012   Pura Spice, PT, DPT # [id number] Gwenlyn Saran, SPT 10/25/2018, 5:56 PM  [facility] [address] [place], [place], [address] Phone: [phone]   Fax:  [phone]  Name: CORNELLA EMMER MRN: [id number] Date of Birth: 10-Mar-1946

## 2018-11-01 ENCOUNTER — Encounter: Payer: Self-pay | Admitting: Physical Therapy

## 2018-11-01 ENCOUNTER — Ambulatory Visit: Payer: Medicare Other | Admitting: Physical Therapy

## 2018-11-01 DIAGNOSIS — M25551 Pain in right hip: Secondary | ICD-10-CM

## 2018-11-01 DIAGNOSIS — M5441 Lumbago with sciatica, right side: Principal | ICD-10-CM

## 2018-11-01 DIAGNOSIS — M6281 Muscle weakness (generalized): Secondary | ICD-10-CM

## 2018-11-01 DIAGNOSIS — G8929 Other chronic pain: Secondary | ICD-10-CM

## 2018-11-01 NOTE — Therapy (Signed)
[facility] [address]. [place], [place], [address] Phone: [phone]   Fax:  [phone]  Physical Therapy Treatment  Patient Details  Name: Jamie Elliott MRN: [id number] Date of Birth: Mar 21, 1946 Referring Provider (PT): Dr. Nicki Reaper   Encounter Date: 11/01/2018  PT End of Session - 11/01/18 0941    Visit Number  2    Number of Visits  4    Date for PT Re-Evaluation  11/22/18    PT Start Time  0858    PT Stop Time  0941    PT Time Calculation (min)  43 min    Activity Tolerance  Patient tolerated treatment well    Behavior During Therapy  [facility] for tasks assessed/performed       Past Medical History:  Diagnosis Date  . Environmental allergies   . Hypercholesterolemia   . Hypertension   . Migraines     Past Surgical History:  Procedure Laterality Date  . ABDOMINAL HYSTERECTOMY  2000  . APPENDECTOMY  2000  . NOSE SURGERY  1967  . SEPTOPLASTY    . SHOULDER ARTHROSCOPY WITH OPEN ROTATOR CUFF REPAIR Right 12/05/2015   Procedure: SHOULDER ARTHROSCOPY WITH MINI OPEN ROTATOR CUFF REPAIR. DISTAL CLAVICLE EXCISION;  Surgeon: Thornton Park, MD;  Location: [facility] ORS;  Service: Orthopedics;  Laterality: Right;    There were no vitals filed for this visit.  Subjective Assessment - 11/01/18 0940    Subjective  Pt. states she is not hurting at this time but has tenderness with palpation over R ITB region.  No anterior hip tenderness or pain with walking.      Limitations  Sitting;Standing;Walking;House hold activities    Currently in Pain?  No/denies        Treatment:  There.ex.: Reviewed HEP/ core progressive ex. Added supine ITB stretches on L/R 3x 20 sec. Each  Manual tx.:  Supine LE/lumbar generalized stretches (as tolerated): piriformis/ ITB/ hamstring/ gastroc (20 min.) STM to L anterior hip/ITB (release technique).    Discussed ice vs. heat    PT Long Term Goals - 10/25/18 1754      PT LONG TERM GOAL #1   Title  Pt. will complete FOTO and improve to 70 to improve daily functional mobility.    Baseline  FOTO: 63    Time  4    Period  Weeks    Status  New    Target Date  11/21/18      PT LONG TERM GOAL #2   Title  Pt will decrease worst back pain as reported on NPRS by at least 2 points in order to demonstrate clinically significant reduction in back pain.     Baseline  Worst Back Pain: 5/10    Time  4    Period  Weeks    Status  New    Target Date  11/21/18      PT LONG TERM GOAL #3   Title  Pt will increase strength of by at least 1/2 MMT grade in order to demonstrate improvement in strength and function.    Baseline  Hip Flexion R/L: 4/4-, Extension R/L: 4/4-, Knee Flexion R/L: 4/4    Time  4    Period  Weeks    Status  New    Target Date  11/21/18            Plan - 11/01/18 0942    Clinical Impression Statement  Palpable tenderness over R ITB during manual tx.  session.  Pt. able to tolerate STM/ release technique during stretches and manual tx.  Good technique/ understanding of TrA muscle activation during core progressive ex. program.      Clinical Presentation  Stable    Clinical Decision Making  Low    Rehab Potential  Good    PT Frequency  1x / week    PT Duration  4 weeks    PT Treatment/Interventions  Cryotherapy;Moist Heat;Gait training;Stair training;Functional mobility training;Therapeutic activities;Therapeutic exercise;Manual techniques;Neuromuscular re-education;Balance training;Passive range of motion;Dry needling;Taping;Vestibular;Joint Manipulations;Spinal Manipulations    PT Next Visit Plan  assess HEP compliance.    PT Home Exercise Plan  see handouts.    Consulted and Agree with Plan of Care  Patient       Patient will benefit from skilled therapeutic intervention in order to improve the following deficits and impairments:  Hypomobility, Pain, Decreased strength, Decreased activity tolerance  Visit Diagnosis: Chronic bilateral low back pain with  right-sided sciatica  Pain in right hip  Muscle weakness (generalized)     Problem List Patient Active Problem List   Diagnosis Date Noted  . Left arm pain 07/24/2018  . Right hip pain 05/24/2018  . Left leg swelling 09/03/2017  . Chest pressure 06/16/2017  . Altered sensation of foot 06/16/2017  . Recurrent UTI 05/12/2017  . Pain in joint, shoulder region 12/05/2015  . Loss of weight 11/25/2015  . History of abnormal mammogram 08/26/2015  . Health care maintenance 05/19/2015  . Osteoporosis 04/17/2014  . Abnormal liver function tests 12/17/2013  . Hypertension 12/04/2012  . Hypercholesterolemia 12/04/2012  . Environmental allergies 12/04/2012   Pura Spice, PT, DPT # [id number] 11/01/2018, 12:42 PM  [place] [facility] [address] [place], [place], [address] Phone: [phone]   Fax:  [phone]  Name: Jamie Elliott MRN: [id number] Date of Birth: [birth year]

## 2018-11-07 ENCOUNTER — Ambulatory Visit: Payer: Medicare Other | Admitting: Physical Therapy

## 2018-11-07 ENCOUNTER — Encounter: Payer: Self-pay | Admitting: Physical Therapy

## 2018-11-07 DIAGNOSIS — M5441 Lumbago with sciatica, right side: Principal | ICD-10-CM

## 2018-11-07 DIAGNOSIS — M6281 Muscle weakness (generalized): Secondary | ICD-10-CM

## 2018-11-07 DIAGNOSIS — G8929 Other chronic pain: Secondary | ICD-10-CM | POA: Diagnosis not present

## 2018-11-07 DIAGNOSIS — M25551 Pain in right hip: Secondary | ICD-10-CM

## 2018-11-07 NOTE — Therapy (Signed)
[facility] [address]. [place], [place], [address] Phone: [phone]   Fax:  [phone]  Physical Therapy Treatment  Patient Details  Name: Jamie Elliott MRN: [id number] Date of Birth: 12/01/1946 Referring Provider (PT): Dr. Nicki Reaper   Encounter Date: 11/07/2018  PT End of Session - 11/07/18 1755    Visit Number  3    Number of Visits  4    Date for PT Re-Evaluation  11/22/18    PT Start Time  0858    PT Stop Time  0949    PT Time Calculation (min)  51 min    Activity Tolerance  Patient tolerated treatment well    Behavior During Therapy  [facility] for tasks assessed/performed       Past Medical History:  Diagnosis Date  . Environmental allergies   . Hypercholesterolemia   . Hypertension   . Migraines     Past Surgical History:  Procedure Laterality Date  . ABDOMINAL HYSTERECTOMY  2000  . APPENDECTOMY  2000  . NOSE SURGERY  1967  . SEPTOPLASTY    . SHOULDER ARTHROSCOPY WITH OPEN ROTATOR CUFF REPAIR Right 12/05/2015   Procedure: SHOULDER ARTHROSCOPY WITH MINI OPEN ROTATOR CUFF REPAIR. DISTAL CLAVICLE EXCISION;  Surgeon: Thornton Park, MD;  Location: [facility] ORS;  Service: Orthopedics;  Laterality: Right;    There were no vitals filed for this visit.  Subjective Assessment - 11/07/18 1754    Subjective  Pt. states she has no pain in R hip at this time but was hurting the other night and unable to lie on R side.  Pt. c/o L UT discomfort and several trigger points/ muscle tightness noted as compared to R.      Limitations  Sitting;Standing;Walking;House hold activities    Currently in Pain?  No/denies         Treatment:  Scifit L5 10 min. B UE/LE (warm-up) Supine core ex.: SLR/ rotn. 10x2.  Sidelying hip abd. 10x2. Supine ITB stretches on L/R 3x 20 sec. Each Standing gastroc/ soleus stretches at stairs 3x20 sec. Each.    Manual tx.:  Supine LE/lumbar generalized stretches (as tolerated): piriformis/ ITB/  hamstring/ gastroc (9 min.) STM to L anterior hip/ITB (release technique).    No c/o pain after tx.  PT assessed L UT muscle tightness/ trigger points.  Discussed benefits of stretching/ massage.      PT Long Term Goals - 10/25/18 1754      PT LONG TERM GOAL #1   Title  Pt. will complete FOTO and improve to 70 to improve daily functional mobility.    Baseline  FOTO: 63    Time  4    Period  Weeks    Status  New    Target Date  11/21/18      PT LONG TERM GOAL #2   Title  Pt will decrease worst back pain as reported on NPRS by at least 2 points in order to demonstrate clinically significant reduction in back pain.     Baseline  Worst Back Pain: 5/10    Time  4    Period  Weeks    Status  New    Target Date  11/21/18      PT LONG TERM GOAL #3   Title  Pt will increase strength of by at least 1/2 MMT grade in order to demonstrate improvement in strength and function.    Baseline  Hip Flexion R/L: 4/4-, Extension R/L: 4/4-, Knee Flexion  R/L: 4/4    Time  4    Period  Weeks    Status  New    Target Date  11/21/18         Plan - 11/07/18 1756    Clinical Impression Statement  Good B hip/ hamstring/ quad muscle flexibility.  Pt. has tenderness over R hip/ ITB region and L UT musculature.  Pt. understands importance of core muscle strengthening and will continue to consistent HEP.  No new HEP issued.      Clinical Presentation  Stable    Clinical Decision Making  Low    Rehab Potential  Good    PT Frequency  1x / week    PT Duration  4 weeks    PT Treatment/Interventions  Cryotherapy;Moist Heat;Gait training;Stair training;Functional mobility training;Therapeutic activities;Therapeutic exercise;Manual techniques;Neuromuscular re-education;Balance training;Passive range of motion;Dry needling;Taping;Vestibular;Joint Manipulations;Spinal Manipulations    PT Next Visit Plan  assess HEP compliance.  Core ex.      PT Home Exercise Plan  see handouts.    Consulted and Agree with  Plan of Care  Patient       Patient will benefit from skilled therapeutic intervention in order to improve the following deficits and impairments:  Hypomobility, Pain, Decreased strength, Decreased activity tolerance  Visit Diagnosis: Chronic bilateral low back pain with right-sided sciatica  Pain in right hip  Muscle weakness (generalized)     Problem List Patient Active Problem List   Diagnosis Date Noted  . Left arm pain 07/24/2018  . Right hip pain 05/24/2018  . Left leg swelling 09/03/2017  . Chest pressure 06/16/2017  . Altered sensation of foot 06/16/2017  . Recurrent UTI 05/12/2017  . Pain in joint, shoulder region 12/05/2015  . Loss of weight 11/25/2015  . History of abnormal mammogram 08/26/2015  . Health care maintenance 05/19/2015  . Osteoporosis 04/17/2014  . Abnormal liver function tests 12/17/2013  . Hypertension 12/04/2012  . Hypercholesterolemia 12/04/2012  . Environmental allergies 12/04/2012   Pura Spice, PT, DPT # [phone] 11/07/2018, 6:07 PM  [place] [facility] [address] [place], [place], [address] Phone: [phone]   Fax:  [phone]  Name: Jamie Elliott MRN: [id number] Date of Birth: 1946/07/28

## 2018-11-15 ENCOUNTER — Encounter: Payer: Self-pay | Admitting: Physical Therapy

## 2018-11-15 ENCOUNTER — Ambulatory Visit: Payer: Medicare Other | Admitting: Physical Therapy

## 2018-11-15 DIAGNOSIS — M5441 Lumbago with sciatica, right side: Principal | ICD-10-CM

## 2018-11-15 DIAGNOSIS — G8929 Other chronic pain: Secondary | ICD-10-CM | POA: Diagnosis not present

## 2018-11-15 DIAGNOSIS — M25551 Pain in right hip: Secondary | ICD-10-CM

## 2018-11-15 DIAGNOSIS — M6281 Muscle weakness (generalized): Secondary | ICD-10-CM

## 2018-11-15 NOTE — Patient Instructions (Signed)
Issued partial squats/ lateral walking with RTB/ marching.   Discussed ITB syndrome.

## 2018-11-15 NOTE — Therapy (Signed)
[place] [facility] [address] [place], [place], [address] Phone: [phone]   Fax:  [phone]  Physical Therapy Treatment  Patient Details  Name: Jamie Elliott MRN: [id number] Date of Birth: [birth year] Referring Provider (PT): Dr. Nicki Reaper   Encounter Date: 11/15/2018  PT End of Session - 11/15/18 1812    Visit Number  4    Number of Visits  4    Date for PT Re-Evaluation  11/22/18    PT Start Time  0813    PT Stop Time  0908    PT Time Calculation (min)  55 min    Activity Tolerance  Patient tolerated treatment well    Behavior During Therapy  [facility] for tasks assessed/performed       Past Medical History:  Diagnosis Date  . Environmental allergies   . Hypercholesterolemia   . Hypertension   . Migraines     Past Surgical History:  Procedure Laterality Date  . ABDOMINAL HYSTERECTOMY  2000  . APPENDECTOMY  2000  . NOSE SURGERY  1967  . SEPTOPLASTY    . SHOULDER ARTHROSCOPY WITH OPEN ROTATOR CUFF REPAIR Right 12/05/2015   Procedure: SHOULDER ARTHROSCOPY WITH MINI OPEN ROTATOR CUFF REPAIR. DISTAL CLAVICLE EXCISION;  Surgeon: Thornton Park, MD;  Location: [facility] ORS;  Service: Orthopedics;  Laterality: Right;    There were no vitals filed for this visit.  Subjective Assessment - 11/15/18 1811    Subjective  Pt. states her L UT is feeling better and blames pain on stress/ muscle tension.  Pt. reports discomfort in R hip but no pain at this time.      Limitations  Sitting;Standing;Walking;House hold activities    Patient Stated Goals  Decrease hip/ back pain.      Currently in Pain?  No/denies         Treatment:  There.ex.:  Scifit L5 10 min. B UE/LE (warm-up)- no c/o hip pain.   Supine core ex.: marching/ SLR/ rotn./ hip abd./ bridging/ bicycle kicks (unilateral)/ hip adduction with ball 10x2.  Sidelying hip abd. 10x2. Walking marching in //-bars/ lateral walking with upright posture 10x.   Reviewed progressive HEP   Manual tx.:  Supine LE/lumbar generalized stretches (as tolerated): piriformis/ ITB/ hamstring/ gastroc (12 min.) Sidelying on L/R psoas stretches 3x20 sec.   STM to L anterior hip/ITB (release technique).   No c/o pain after tx.     PT Education - 11/15/18 1812    Education Details  See HEP    Person(s) Educated  Patient    Methods  Explanation;Demonstration;Handout    Comprehension  Verbalized understanding;Returned demonstration          PT Long Term Goals - 10/25/18 1754      PT LONG TERM GOAL #1   Title  Pt. will complete FOTO and improve to 70 to improve daily functional mobility.    Baseline  FOTO: 63    Time  4    Period  Weeks    Status  New    Target Date  11/21/18      PT LONG TERM GOAL #2   Title  Pt will decrease worst back pain as reported on NPRS by at least 2 points in order to demonstrate clinically significant reduction in back pain.     Baseline  Worst Back Pain: 5/10    Time  4    Period  Weeks    Status  New    Target Date  11/21/18  PT LONG TERM GOAL #3   Title  Pt will increase strength of by at least 1/2 MMT grade in order to demonstrate improvement in strength and function.    Baseline  Hip Flexion R/L: 4/4-, Extension R/L: 4/4-, Knee Flexion R/L: 4/4    Time  4    Period  Weeks    Status  New    Target Date  11/21/18            Plan - 11/15/18 1813    Clinical Impression Statement  Pt. has generalized R hip/ITB tenderness with palpation.  Pt. presents with good overall LE muscle flexibility, esp. with hamstring/ piriformis muscles.  (-) Ober but a stretch in ITB noted (R>L).  Pt. has continued weakness in core musculature and requires occasional cuing to activate TrA with supine ex. program.  Pt. returns to MD on 12/16 for f/u and will continue with skilled PT services at this time.      Clinical Presentation  Stable    Clinical Decision Making  Low    Rehab Potential  Good    PT Frequency  1x / week    PT Duration  4  weeks    PT Treatment/Interventions  Cryotherapy;Moist Heat;Gait training;Stair training;Functional mobility training;Therapeutic activities;Therapeutic exercise;Manual techniques;Neuromuscular re-education;Balance training;Passive range of motion;Dry needling;Taping;Vestibular;Joint Manipulations;Spinal Manipulations    PT Next Visit Plan  Reassess goals/ Recert for 2 weeks.      PT Home Exercise Plan  see handouts.    Consulted and Agree with Plan of Care  Patient       Patient will benefit from skilled therapeutic intervention in order to improve the following deficits and impairments:  Hypomobility, Pain, Decreased strength, Decreased activity tolerance  Visit Diagnosis: Chronic bilateral low back pain with right-sided sciatica  Pain in right hip  Muscle weakness (generalized)     Problem List Patient Active Problem List   Diagnosis Date Noted  . Left arm pain 07/24/2018  . Right hip pain 05/24/2018  . Left leg swelling 09/03/2017  . Chest pressure 06/16/2017  . Altered sensation of foot 06/16/2017  . Recurrent UTI 05/12/2017  . Pain in joint, shoulder region 12/05/2015  . Loss of weight 11/25/2015  . History of abnormal mammogram 08/26/2015  . Health care maintenance 05/19/2015  . Osteoporosis 04/17/2014  . Abnormal liver function tests 12/17/2013  . Hypertension 12/04/2012  . Hypercholesterolemia 12/04/2012  . Environmental allergies 12/04/2012   Pura Spice, PT, DPT # [id number] 11/15/2018, 6:16 PM  [place] [facility] [address] [place], [place], [address] Phone: [phone]   Fax:  [phone]  Name: Jamie Elliott MRN: [id number] Date of Birth: 1946-08-18

## 2018-11-23 ENCOUNTER — Encounter: Payer: Self-pay | Admitting: Physical Therapy

## 2018-11-23 ENCOUNTER — Ambulatory Visit: Payer: Medicare Other | Attending: Internal Medicine | Admitting: Physical Therapy

## 2018-11-23 DIAGNOSIS — M5441 Lumbago with sciatica, right side: Secondary | ICD-10-CM | POA: Diagnosis not present

## 2018-11-23 DIAGNOSIS — M25551 Pain in right hip: Secondary | ICD-10-CM | POA: Diagnosis not present

## 2018-11-23 DIAGNOSIS — M6281 Muscle weakness (generalized): Secondary | ICD-10-CM

## 2018-11-23 DIAGNOSIS — G8929 Other chronic pain: Secondary | ICD-10-CM | POA: Diagnosis not present

## 2018-11-23 NOTE — Patient Instructions (Signed)
Access Code: [email or web address]  URL: [email or web address]  Date: 11/23/2018  Prepared by: Dorcas Carrow   Exercises  Beginner Bridge - 20 reps - 1 sets - 2 hold - 1x daily - 7x weekly  Hooklying Clamshell with Resistance - 20 reps - 1 sets - 2 hold - 1x daily - 7x weekly  Supine March - 20 reps - 1 sets - 1x daily - 7x weekly  Supine ITB Stretch - 4 reps - 1 sets - 20 hold - 1x daily - 7x weekly  Standing Hamstring Stretch on Chair - 4 reps - 1 sets - 20 hold - 1x daily - 7x weekly  Standing Gastroc Stretch - 4 reps - 1 sets - 20 hold - 1x daily - 7x weekly  Standing Lumbar Extension - 4 reps - 1 sets - 1x daily - 7x weekly

## 2018-11-23 NOTE — Therapy (Signed)
[place] [facility] [address] [place], [place], [address] Phone: [phone]   Fax:  [phone]  Physical Therapy Treatment  Patient Details  Name: EMPRESS NEWMANN MRN: [id number] Date of Birth: Aug 21, 1946 Referring Provider (PT): Dr. Nicki Reaper   Encounter Date: 11/23/2018  PT End of Session - 11/23/18 1035    Visit Number  5    Number of Visits  8    Date for PT Re-Evaluation  12/21/18    PT Start Time  5361    PT Stop Time  1115    PT Time Calculation (min)  52 min    Activity Tolerance  Patient tolerated treatment well    Behavior During Therapy  [facility] for tasks assessed/performed       Past Medical History:  Diagnosis Date  . Environmental allergies   . Hypercholesterolemia   . Hypertension   . Migraines     Past Surgical History:  Procedure Laterality Date  . ABDOMINAL HYSTERECTOMY  2000  . APPENDECTOMY  2000  . NOSE SURGERY  1967  . SEPTOPLASTY    . SHOULDER ARTHROSCOPY WITH OPEN ROTATOR CUFF REPAIR Right 12/05/2015   Procedure: SHOULDER ARTHROSCOPY WITH MINI OPEN ROTATOR CUFF REPAIR. DISTAL CLAVICLE EXCISION;  Surgeon: Thornton Park, MD;  Location: [facility] ORS;  Service: Orthopedics;  Laterality: Right;    There were no vitals filed for this visit.  Subjective Assessment - 11/23/18 1030    Subjective  Pt. entered PT with no c/o pain but has several questions about current HEP.  Pt. states she is considering joining a gym ([employer]) or maybe will go to the mall in [place] to walk, esp. when it is cold outside.      Limitations  Sitting;Standing;Walking;House hold activities    Patient Stated Goals  Decrease hip/ back pain.      Currently in Pain?  No/denies         Treatment:  There.ex.:  Supine core ex.: marching/ SLR/ rotn./ hip abd./ bridging/ bicycle kicks (unilateral)/ hip adduction with ball 10x2. Sidelying hip abd. 10x2. Lateral walking/ hip marching with GTB in //-bars (cuing upright posture 10x) on  outside of //-bars.   See new HEP  Manual tx.:  Supine LE/lumbar generalized stretches (as tolerated): piriformis/ ITB/ hamstring/ gastroc (9 min.)   STM to B anterior hip/ITB (no tenderness today).   Discussed joining gym or walking program   PT Education - 11/23/18 1059    Education Details  See HEP    Person(s) Educated  Patient    Methods  Explanation;Demonstration;Handout    Comprehension  Verbalized understanding;Returned demonstration          PT Long Term Goals - 11/23/18 1254      PT LONG TERM GOAL #1   Title  Pt. will complete FOTO and improve to 70 to improve daily functional mobility.    Baseline  FOTO: 63    Time  4    Period  Weeks    Status  On-going    Target Date  12/21/18      PT LONG TERM GOAL #2   Title  Pt will decrease worst back pain as reported on NPRS by at least 2 points in order to demonstrate clinically significant reduction in back pain.     Baseline  Worst Back Pain: 5/10 during initial eval.  On 12/4: pt. reports 2/10 at worst.      Time  4    Period  Weeks  Status  Partially Met    Target Date  12/21/18      PT LONG TERM GOAL #3   Title  Pt will increase strength of by at least 1/2 MMT grade in order to demonstrate improvement in strength and function.    Baseline  Hip Flexion R/L: 4+/4+, Extension R/L: 4+/4+, Knee Flexion R/L: 4+/4+    Time  4    Period  Weeks    Status  Partially Met    Target Date  12/21/18         Plan - 11/23/18 1035    Clinical Impression Statement  No tenderness noted in B hips/ ITB region with palpation/ manual tx.  Pt. ambulates with normalized gait pattern and no c/o hip pain at this time.  Pt. has shown marked increase in hip strengthening since initial evaluation and will continue to benefit from core stability/ hip strengthening ex. program.  See updated goals.      Clinical Presentation  Stable    Clinical Decision Making  Low    Rehab Potential  Good    PT Frequency  1x / week    PT  Duration  4 weeks    PT Treatment/Interventions  Cryotherapy;Moist Heat;Gait training;Stair training;Functional mobility training;Therapeutic activities;Therapeutic exercise;Manual techniques;Neuromuscular re-education;Balance training;Passive range of motion;Dry needling;Taping;Vestibular;Joint Manipulations;Spinal Manipulations    PT Next Visit Plan  1 more tx. session scheduled prior to MD f/u appt.      PT Home Exercise Plan  see handouts.    Consulted and Agree with Plan of Care  Patient       Patient will benefit from skilled therapeutic intervention in order to improve the following deficits and impairments:  Hypomobility, Pain, Decreased strength, Decreased activity tolerance  Visit Diagnosis: Chronic bilateral low back pain with right-sided sciatica  Pain in right hip  Muscle weakness (generalized)     Problem List Patient Active Problem List   Diagnosis Date Noted  . Left arm pain 07/24/2018  . Right hip pain 05/24/2018  . Left leg swelling 09/03/2017  . Chest pressure 06/16/2017  . Altered sensation of foot 06/16/2017  . Recurrent UTI 05/12/2017  . Pain in joint, shoulder region 12/05/2015  . Loss of weight 11/25/2015  . History of abnormal mammogram 08/26/2015  . Health care maintenance 05/19/2015  . Osteoporosis 04/17/2014  . Abnormal liver function tests 12/17/2013  . Hypertension 12/04/2012  . Hypercholesterolemia 12/04/2012  . Environmental allergies 12/04/2012   Pura Spice, PT, DPT # [id number] 11/23/2018, 12:57 PM  [place] [facility] [address] [place], [place], [address] Phone: [phone]   Fax:  [phone]  Name: KNIYAH KHUN MRN: [id number] Date of Birth: 27-Mar-1946

## 2018-11-30 ENCOUNTER — Ambulatory Visit: Payer: Medicare Other | Admitting: Physical Therapy

## 2018-11-30 ENCOUNTER — Encounter: Payer: Self-pay | Admitting: Physical Therapy

## 2018-11-30 DIAGNOSIS — M6281 Muscle weakness (generalized): Secondary | ICD-10-CM | POA: Diagnosis not present

## 2018-11-30 DIAGNOSIS — G8929 Other chronic pain: Secondary | ICD-10-CM

## 2018-11-30 DIAGNOSIS — M25551 Pain in right hip: Secondary | ICD-10-CM

## 2018-11-30 DIAGNOSIS — M5441 Lumbago with sciatica, right side: Secondary | ICD-10-CM | POA: Diagnosis not present

## 2018-12-05 ENCOUNTER — Other Ambulatory Visit (INDEPENDENT_AMBULATORY_CARE_PROVIDER_SITE_OTHER): Payer: Medicare Other

## 2018-12-05 DIAGNOSIS — E7800 Pure hypercholesterolemia, unspecified: Secondary | ICD-10-CM | POA: Diagnosis not present

## 2018-12-05 DIAGNOSIS — I10 Essential (primary) hypertension: Secondary | ICD-10-CM | POA: Diagnosis not present

## 2018-12-05 LAB — LIPID PANEL
CHOLESTEROL: 181 mg/dL (ref 0–200)
HDL: 63.9 mg/dL (ref >39.00)
LDL Cholesterol: 90 mg/dL (ref 0–99)
NONHDL: 117.49
Total CHOL/HDL Ratio: 3
Triglycerides: 139 mg/dL (ref 0.0–149.0)
VLDL: 27.8 mg/dL (ref 0.0–40.0)

## 2018-12-05 LAB — BASIC METABOLIC PANEL
BUN: 14 mg/dL (ref 6–23)
CALCIUM: 10.1 mg/dL (ref 8.4–10.5)
CO2: 29 mEq/L (ref 19–32)
Chloride: 104 mEq/L (ref 96–112)
Creatinine, Ser: 0.71 mg/dL (ref 0.40–1.20)
GFR: 85.8 mL/min (ref >60.00)
Glucose, Bld: 89 mg/dL (ref 70–99)
POTASSIUM: 3.9 meq/L (ref 3.5–5.1)
Sodium: 141 mEq/L (ref 135–145)

## 2018-12-05 LAB — HEPATIC FUNCTION PANEL
ALBUMIN: 4.1 g/dL (ref 3.5–5.2)
ALK PHOS: 92 U/L (ref 39–117)
ALT: 45 U/L — AB (ref 0–35)
AST: 30 U/L (ref 0–37)
Bilirubin, Direct: 0.1 mg/dL (ref 0.0–0.3)
Total Bilirubin: 0.5 mg/dL (ref 0.2–1.2)
Total Protein: 6.8 g/dL (ref 6.0–8.3)

## 2018-12-07 ENCOUNTER — Ambulatory Visit (INDEPENDENT_AMBULATORY_CARE_PROVIDER_SITE_OTHER): Payer: Medicare Other | Admitting: Internal Medicine

## 2018-12-07 ENCOUNTER — Encounter: Payer: Self-pay | Admitting: Internal Medicine

## 2018-12-07 DIAGNOSIS — R945 Abnormal results of liver function studies: Secondary | ICD-10-CM | POA: Diagnosis not present

## 2018-12-07 DIAGNOSIS — E7800 Pure hypercholesterolemia, unspecified: Secondary | ICD-10-CM | POA: Diagnosis not present

## 2018-12-07 DIAGNOSIS — M545 Low back pain, unspecified: Secondary | ICD-10-CM

## 2018-12-07 DIAGNOSIS — Z9109 Other allergy status, other than to drugs and biological substances: Secondary | ICD-10-CM

## 2018-12-07 DIAGNOSIS — I10 Essential (primary) hypertension: Secondary | ICD-10-CM | POA: Diagnosis not present

## 2018-12-07 DIAGNOSIS — R7989 Other specified abnormal findings of blood chemistry: Secondary | ICD-10-CM

## 2018-12-07 NOTE — Progress Notes (Signed)
Patient ID: Jamie Elliott, female   DOB: 12-13-1946, 72 y.o.   MRN: [id number]   Subjective:    Patient ID: Jamie Elliott, female    DOB: 08-18-1946, 72 y.o.   MRN: [id number]  HPI  Patient here for a scheduled follow up.  She reports she has been doing relatively well.  Tries to stay active.  Has been having some back pain.  Persistent.  Affecting her with her daily activities.  Hurts after she has been sitting for a while.  Hurts when she stands to cook or wash dishes.  Increased pain.  No pain radiating down her legs.  No numbness or tingling.  No chest pain.  No sob.  No acid reflux.  No abdominal pain.  Bowels moving.  No urine change.     Past Medical History:  Diagnosis Date  . Environmental allergies   . Hypercholesterolemia   . Hypertension   . Migraines    Past Surgical History:  Procedure Laterality Date  . ABDOMINAL HYSTERECTOMY  2000  . APPENDECTOMY  2000  . NOSE SURGERY  1967  . SEPTOPLASTY    . SHOULDER ARTHROSCOPY WITH OPEN ROTATOR CUFF REPAIR Right 12/05/2015   Procedure: SHOULDER ARTHROSCOPY WITH MINI OPEN ROTATOR CUFF REPAIR. DISTAL CLAVICLE EXCISION;  Surgeon: Thornton Park, MD;  Location: [facility] ORS;  Service: Orthopedics;  Laterality: Right;   Family History  Problem Relation Age of Onset  . Heart disease Mother   . Hyperlipidemia Mother   . Hypertension Mother   . Cancer Father        lung, prostate  . Hyperlipidemia Father   . Hypertension Father   . Prostate cancer Father   . Bladder Cancer Neg Hx   . Kidney cancer Neg Hx    Social History   Socioeconomic History  . Marital status: Married    Spouse name: Not on file  . Number of children: 2  . Years of education: Not on file  . Highest education level: Not on file  Occupational History  . Not on file  Social Needs  . Financial resource strain: Not on file  . Food insecurity:    Worry: Not on file    Inability: Not on file  . Transportation needs:    Medical: Not on file    Non-medical:  Not on file  Tobacco Use  . Smoking status: Never Smoker  . Smokeless tobacco: Never Used  Substance and Sexual Activity  . Alcohol use: No    Alcohol/week: 0.0 standard drinks  . Drug use: No  . Sexual activity: Not Currently  Lifestyle  . Physical activity:    Days per week: Not on file    Minutes per session: Not on file  . Stress: Not on file  Relationships  . Social connections:    Talks on phone: Not on file    Gets together: Not on file    Attends religious service: Not on file    Active member of club or organization: Not on file    Attends meetings of clubs or organizations: Not on file    Relationship status: Not on file  Other Topics Concern  . Not on file  Social History Narrative  . Not on file    Outpatient Encounter Medications as of 12/07/2018  Medication Sig  . acetaminophen (TYLENOL) 500 MG tablet Take 500 mg by mouth every 6 (six) hours as needed.  Marland Kitchen amLODipine (NORVASC) 2.5 MG tablet Take 1 tablet (2.5 mg total)  by mouth daily.  Marland Kitchen aspirin 325 MG EC tablet Take 325 mg by mouth daily.  Marland Kitchen atorvastatin (LIPITOR) 20 MG tablet Take 1 tablet (20 mg total) daily by mouth.  . cetirizine (ZYRTEC) 10 MG tablet Take 1 tablet (10 mg total) by mouth daily.  Marland Kitchen CRANBERRY EXTRACT PO Take 1 capsule by mouth 2 (two) times daily.  Marland Kitchen losartan-hydrochlorothiazide (HYZAAR) 100-12.5 MG tablet Take 1 tablet by mouth daily.  . Probiotic Product (ALIGN PO) Take by mouth as needed.  . triamcinolone (NASACORT) 55 MCG/ACT nasal inhaler Place 2 sprays into the nose as needed.  . [DISCONTINUED] Coenzyme Q10 (CO Q 10 PO) Take by mouth 2 (two) times daily.   No facility-administered encounter medications on file as of 12/07/2018.     Review of Systems  Constitutional: Negative for appetite change and unexpected weight change.  HENT: Negative for congestion and sinus pressure.   Respiratory: Negative for cough, chest tightness and shortness of breath.   Cardiovascular: Negative for  chest pain, palpitations and leg swelling.  Gastrointestinal: Negative for abdominal pain, diarrhea, nausea and vomiting.  Genitourinary: Negative for difficulty urinating and dysuria.  Musculoskeletal: Positive for back pain. Negative for joint swelling and myalgias.  Skin: Negative for color change and rash.  Neurological: Negative for dizziness, light-headedness and headaches.  Psychiatric/Behavioral: Negative for agitation and dysphoric mood.       Objective:    Physical Exam Constitutional:      General: She is not in acute distress.    Appearance: Normal appearance.  HENT:     Nose: Nose normal. No congestion.     Mouth/Throat:     Pharynx: No oropharyngeal exudate or posterior oropharyngeal erythema.  Neck:     Musculoskeletal: Neck supple. No muscular tenderness.     Thyroid: No thyromegaly.  Cardiovascular:     Rate and Rhythm: Normal rate and regular rhythm.  Pulmonary:     Effort: No respiratory distress.     Breath sounds: Normal breath sounds. No wheezing.  Abdominal:     General: Bowel sounds are normal.     Palpations: Abdomen is soft.     Tenderness: There is no abdominal tenderness.  Musculoskeletal:        General: No swelling or tenderness.  Lymphadenopathy:     Cervical: No cervical adenopathy.  Skin:    Findings: No erythema or rash.  Neurological:     Mental Status: She is alert.  Psychiatric:        Mood and Affect: Mood normal.        Thought Content: Thought content normal.     BP 128/70 (BP Location: Left Arm, Patient Position: Sitting, Cuff Size: Normal)   Pulse 79   Temp 98 F (36.7 C) (Oral)   Resp 16   Wt 122 lb 3.2 oz (55.4 kg)   LMP 11/28/1998   SpO2 98%   BMI 22.72 kg/m  Wt Readings from Last 3 Encounters:  12/07/18 122 lb 3.2 oz (55.4 kg)  10/06/18 121 lb 6.4 oz (55.1 kg)  10/04/18 120 lb 12.8 oz (54.8 kg)     Lab Results  Component Value Date   WBC 6.6 07/20/2018   HGB 12.9 07/20/2018   HCT 38.2 07/20/2018   PLT  224.0 07/20/2018   GLUCOSE 89 12/05/2018   CHOL 181 12/05/2018   TRIG 139.0 12/05/2018   HDL 63.90 12/05/2018   LDLDIRECT 103.0 05/21/2015   LDLCALC 90 12/05/2018   ALT 45 (H) 12/05/2018   AST  30 12/05/2018   NA 141 12/05/2018   K 3.9 12/05/2018   CL 104 12/05/2018   CREATININE 0.71 12/05/2018   BUN 14 12/05/2018   CO2 29 12/05/2018   TSH 1.97 07/20/2018   INR 1.00 11/25/2015    Dg Abd 1 View  Result Date: 01/11/2018 CLINICAL DATA:  Low back pain and urinary tract infections for the past 2 months. No nausea, vomiting, or fever. EXAM: ABDOMEN - 1 VIEW COMPARISON:  None in PACs FINDINGS: The colonic stool burden is mildly increased. There no small or large bowel obstructive pattern. There are no abnormal soft tissue calcifications. There are pelvic phleboliths. There is gentle levocurvature centered in the mid lumbar spine. Mild degenerative disc disease is noted in the upper and lower lumbar spine. IMPRESSION: Mildly increased colonic stool burden may reflect constipation in the appropriate clinical setting. No acute intra-abdominal abnormality is observed. Mild degenerative disc change with scoliosis of the lumbar spine. Electronically Signed   By: David  Martinique M.D.   On: 01/11/2018 11:31       Assessment & Plan:   Problem List Items Addressed This Visit    Abnormal liver function tests    Followed liver panel.        Relevant Orders   Hepatic function panel   Environmental allergies    Controlled.        Hypercholesterolemia    Low cholesterol diet and exercise.  On lipitor.  Follow lipid panel and liver function tests.        Hypertension    Blood pressure under good control.  Continue same medication regimen.  Follow pressures.  Follow metabolic panel.        Low back pain    Persistent increased back pain affecting her daily activities.  Discussed further evaluation.  Request referral to ortho.        Relevant Orders   Ambulatory referral to Orthopedic Surgery         Einar Pheasant, MD

## 2018-12-09 NOTE — Therapy (Addendum)
[place] [facility] [address] [place], [place], [address] Phone: [phone]   Fax:  [phone]  Physical Therapy Treatment  Patient Details  Name: Jamie Elliott MRN: [id number] Date of Birth: 04/30/1946 Referring Provider (PT): Dr. Nicki Reaper   Encounter Date: 11/30/2018  PT End of Session - 12/18/18 1941    Visit Number  6    Number of Visits  8    Date for PT Re-Evaluation  12/21/18    PT Start Time  1031    PT Stop Time  1122    PT Time Calculation (min)  51 min    Activity Tolerance  Patient tolerated treatment well    Behavior During Therapy  [facility] for tasks assessed/performed       Past Medical History:  Diagnosis Date  . Environmental allergies   . Hypercholesterolemia   . Hypertension   . Migraines     Past Surgical History:  Procedure Laterality Date  . ABDOMINAL HYSTERECTOMY  2000  . APPENDECTOMY  2000  . NOSE SURGERY  1967  . SEPTOPLASTY    . SHOULDER ARTHROSCOPY WITH OPEN ROTATOR CUFF REPAIR Right 12/05/2015   Procedure: SHOULDER ARTHROSCOPY WITH MINI OPEN ROTATOR CUFF REPAIR. DISTAL CLAVICLE EXCISION;  Surgeon: Thornton Park, MD;  Location: [facility] ORS;  Service: Orthopedics;  Laterality: Right;    There were no vitals filed for this visit.    Pt. hasn't joined gym yet but understands that after PT ends today she will need to continue ex. program.        Treatment:  There.ex.:  Supine core ex.:marching/SLR/ rotn./ hip abd./ bridging/ bicycle kicks (unilateral)/ hip adduction with ball10x2. Sidelying hip abd. 10x2. Standing hip ex. (GTB)- 20x all planes of movement (cuing for posture).  Manual tx.:  Supine LE/lumbar generalized stretches (as tolerated): piriformis/ ITB/ hamstring/ gastroc (13 min.) STM to B anterior hip/ITB (no tenderness today).   Discussed joining gym or walking program  PT Long Term Goals - 12/18/18 1945      PT LONG TERM GOAL #1   Title  Pt. will complete FOTO and  improve to 70 to improve daily functional mobility.    Baseline  FOTO: 63 on initial eval.   12/11: 63 (no change)    Time  4    Period  Weeks    Status  Not Met    Target Date  11/30/18      PT LONG TERM GOAL #2   Title  Pt will decrease worst back pain as reported on NPRS by at least 2 points in order to demonstrate clinically significant reduction in back pain.     Baseline  Worst Back Pain: 5/10 during initial eval.  On 12/4: pt. reports 2/10 at worst.      Time  4    Period  Weeks    Status  Partially Met    Target Date  11/30/18      PT LONG TERM GOAL #3   Title  Pt will increase strength of by at least 1/2 MMT grade in order to demonstrate improvement in strength and function.    Baseline  Hip Flexion R/L: 4+/4+, Extension R/L: 4+/4+, Knee Flexion R/L: 4+/4+    Time  4    Period  Weeks    Status  Partially Met    Target Date  11/30/18            Plan - 12/18/18 1943    Clinical Impression Statement  Pt. has progressed with skilled PT services and currently showing signs of max. physical function at this time.  Pt. reports no pain at rest and presents with no tenderness.  Pt. discharged from PT at this time with encouragement to join gym.  PT recommends pt. f/u with ortho MD if pain persists.  Discharge from PT at this time.      Clinical Presentation  Stable    Clinical Decision Making  Low    Rehab Potential  Good    PT Frequency  1x / week    PT Duration  4 weeks    PT Treatment/Interventions  Cryotherapy;Moist Heat;Gait training;Stair training;Functional mobility training;Therapeutic activities;Therapeutic exercise;Manual techniques;Neuromuscular re-education;Balance training;Passive range of motion;Dry needling;Taping;Vestibular;Joint Manipulations;Spinal Manipulations    PT Next Visit Plan  Discharge    PT Home Exercise Plan  see handouts.    Consulted and Agree with Plan of Care  Patient       Patient will benefit from skilled therapeutic intervention in order  to improve the following deficits and impairments:  Hypomobility, Pain, Decreased strength, Decreased activity tolerance  Visit Diagnosis: Chronic bilateral low back pain with right-sided sciatica  Pain in right hip  Muscle weakness (generalized)     Problem List Patient Active Problem List   Diagnosis Date Noted  . Low back pain 12/11/2018  . Left arm pain 07/24/2018  . Right hip pain 05/24/2018  . Left leg swelling 09/03/2017  . Chest pressure 06/16/2017  . Altered sensation of foot 06/16/2017  . Recurrent UTI 05/12/2017  . Pain in joint, shoulder region 12/05/2015  . Loss of weight 11/25/2015  . History of abnormal mammogram 08/26/2015  . Health care maintenance 05/19/2015  . Osteoporosis 04/17/2014  . Abnormal liver function tests 12/17/2013  . Hypertension 12/04/2012  . Hypercholesterolemia 12/04/2012  . Environmental allergies 12/04/2012   Pura Spice, PT, DPT # [id number] 12/18/2018, 7:47 PM  [place] [facility] [address] [place], [place], [address] Phone: [phone]   Fax:  [phone]  Name: Jamie Elliott MRN: [id number] Date of Birth: Dec 22, 1945

## 2018-12-11 ENCOUNTER — Encounter: Payer: Self-pay | Admitting: Internal Medicine

## 2018-12-11 DIAGNOSIS — M545 Low back pain, unspecified: Secondary | ICD-10-CM | POA: Insufficient documentation

## 2018-12-11 NOTE — Assessment & Plan Note (Signed)
Controlled.  

## 2018-12-11 NOTE — Assessment & Plan Note (Signed)
Low cholesterol diet and exercise.  On lipitor.  Follow lipid panel and liver function tests.   

## 2018-12-11 NOTE — Assessment & Plan Note (Signed)
Blood pressure under good control.  Continue same medication regimen.  Follow pressures.  Follow metabolic panel.   

## 2018-12-11 NOTE — Assessment & Plan Note (Signed)
Persistent increased back pain affecting her daily activities.  Discussed further evaluation.  Request referral to ortho.

## 2018-12-11 NOTE — Assessment & Plan Note (Signed)
Followed liver panel.

## 2018-12-15 ENCOUNTER — Other Ambulatory Visit: Payer: Self-pay | Admitting: Internal Medicine

## 2018-12-26 ENCOUNTER — Other Ambulatory Visit (INDEPENDENT_AMBULATORY_CARE_PROVIDER_SITE_OTHER): Payer: Medicare Other

## 2018-12-26 ENCOUNTER — Encounter: Payer: Self-pay | Admitting: Family Medicine

## 2018-12-26 ENCOUNTER — Ambulatory Visit (INDEPENDENT_AMBULATORY_CARE_PROVIDER_SITE_OTHER): Payer: Medicare Other | Admitting: Family Medicine

## 2018-12-26 ENCOUNTER — Encounter: Payer: Self-pay | Admitting: Internal Medicine

## 2018-12-26 ENCOUNTER — Other Ambulatory Visit: Payer: Self-pay | Admitting: Internal Medicine

## 2018-12-26 VITALS — BP 125/73 | HR 92 | Ht 61.5 in

## 2018-12-26 DIAGNOSIS — R7989 Other specified abnormal findings of blood chemistry: Secondary | ICD-10-CM

## 2018-12-26 DIAGNOSIS — R945 Abnormal results of liver function studies: Secondary | ICD-10-CM

## 2018-12-26 DIAGNOSIS — N390 Urinary tract infection, site not specified: Secondary | ICD-10-CM

## 2018-12-26 LAB — HEPATIC FUNCTION PANEL
ALBUMIN: 3.8 g/dL (ref 3.5–5.2)
ALT: 20 U/L (ref 0–35)
AST: 22 U/L (ref 0–37)
Alkaline Phosphatase: 73 U/L (ref 39–117)
Bilirubin, Direct: 0 mg/dL (ref 0.0–0.3)
TOTAL PROTEIN: 6.2 g/dL (ref 6.0–8.3)
Total Bilirubin: 0.3 mg/dL (ref 0.2–1.2)

## 2018-12-26 LAB — MICROSCOPIC EXAMINATION: RBC, UA: NONE SEEN /hpf (ref 0–2)

## 2018-12-26 LAB — URINALYSIS, COMPLETE
Bilirubin, UA: NEGATIVE
Glucose, UA: NEGATIVE
Ketones, UA: NEGATIVE
Nitrite, UA: NEGATIVE
Protein, UA: NEGATIVE
SPEC GRAV UA: 1.01 (ref 1.005–1.030)
Urobilinogen, Ur: 0.2 mg/dL (ref 0.2–1.0)
pH, UA: 6.5 (ref 5.0–7.5)

## 2018-12-26 MED ORDER — NITROFURANTOIN MONOHYD MACRO 100 MG PO CAPS: 100.0000 mg | ORAL_CAPSULE | Freq: Two times a day (BID) | ORAL | 0 refills | Status: DC

## 2018-12-26 NOTE — Progress Notes (Signed)
Patient presents today with Urinary frequency and dysuria. Her symptoms began 4 days ago. Patient states she has not been on ABX or had any Urological surgeries in the last 30 days. A urine was collected for UA, UCX. Dr. Diamantina Providence reviewed the UA and Macrobid was sent to pharmacy.

## 2018-12-28 LAB — CULTURE, URINE COMPREHENSIVE

## 2019-01-10 DIAGNOSIS — M545 Low back pain: Secondary | ICD-10-CM | POA: Diagnosis not present

## 2019-01-10 DIAGNOSIS — M533 Sacrococcygeal disorders, not elsewhere classified: Secondary | ICD-10-CM | POA: Diagnosis not present

## 2019-02-01 ENCOUNTER — Other Ambulatory Visit: Payer: Self-pay | Admitting: Internal Medicine

## 2019-02-14 ENCOUNTER — Other Ambulatory Visit: Payer: Self-pay | Admitting: Internal Medicine

## 2019-03-27 ENCOUNTER — Encounter: Payer: Medicare Other | Admitting: Internal Medicine

## 2019-04-06 ENCOUNTER — Encounter: Payer: Self-pay | Admitting: Internal Medicine

## 2019-04-06 ENCOUNTER — Other Ambulatory Visit: Payer: Self-pay

## 2019-04-06 ENCOUNTER — Ambulatory Visit (INDEPENDENT_AMBULATORY_CARE_PROVIDER_SITE_OTHER): Payer: Medicare Other | Admitting: Internal Medicine

## 2019-04-06 DIAGNOSIS — M545 Low back pain, unspecified: Secondary | ICD-10-CM

## 2019-04-06 DIAGNOSIS — R945 Abnormal results of liver function studies: Secondary | ICD-10-CM

## 2019-04-06 DIAGNOSIS — I10 Essential (primary) hypertension: Secondary | ICD-10-CM | POA: Diagnosis not present

## 2019-04-06 DIAGNOSIS — E7800 Pure hypercholesterolemia, unspecified: Secondary | ICD-10-CM | POA: Diagnosis not present

## 2019-04-06 DIAGNOSIS — Z9109 Other allergy status, other than to drugs and biological substances: Secondary | ICD-10-CM | POA: Diagnosis not present

## 2019-04-06 DIAGNOSIS — R7989 Other specified abnormal findings of blood chemistry: Secondary | ICD-10-CM

## 2019-04-06 MED ORDER — AMLODIPINE BESYLATE 5 MG PO TABS: 5.0000 mg | ORAL_TABLET | Freq: Every day | ORAL | 3 refills | Status: DC

## 2019-04-06 NOTE — Progress Notes (Addendum)
Patient ID: Jamie Elliott, female   DOB: 1946-08-01, 73 y.o.   MRN: [id number] Virtual Visit via Video: Note  This visit type was conducted due to national recommendations for restrictions regarding the COVID-19 pandemic (e.g. social distancing).  This format is felt to be most appropriate for this patient at this time.  All issues noted in this document were discussed and addressed.  No physical exam was performed (except for noted visual exam findings with Video Visits).   I connected with Jamie Elliott on 04/06/19 at  9:30 AM EDT by a video enabled telemedicine application.  Verified that I am speaking with the correct person using two identifiers. Location patient: home Location provider: work  Persons participating in the virtual visit: patient, provider  I discussed the limitations, risks, security and privacy concerns of performing an evaluation and management service by video and the availability of in person appointments. The patient expressed understanding and agreed to proceed.   Reason for visit: scheduled follow up.   HPI: She reports she is doing relatively well.  Trying to stay in.  Her son works at [employer].  Discussed social distancing and importance of washing hands, etc.  She denies any fever.  No chest congestion or sob.  States her pressure is averaging around 315 systolic now.  Taking her medication regularly.  No chest pain.  No acid reflux.  No abdominal pain.  Bowels moving.  No urine change.  Has seen ortho for her back pain.  Started taking meloxicam.  Only taking one 7.5mg  q day.  States no pain.  She was questioning if needed to continue to take.  She is afraid to stop.  Discussed decreasing dose - to qod initially.  She is exercising. Walking 3.5 miles per day.  Overall she feels she is doing relatively well.     ROS: See pertinent positives and negatives per HPI.  Past Medical History:  Diagnosis Date  . Environmental allergies   . Hypercholesterolemia   .  Hypertension   . Migraines     Past Surgical History:  Procedure Laterality Date  . ABDOMINAL HYSTERECTOMY  2000  . APPENDECTOMY  2000  . NOSE SURGERY  1967  . SEPTOPLASTY    . SHOULDER ARTHROSCOPY WITH OPEN ROTATOR CUFF REPAIR Right 12/05/2015   Procedure: SHOULDER ARTHROSCOPY WITH MINI OPEN ROTATOR CUFF REPAIR. DISTAL CLAVICLE EXCISION;  Surgeon: Thornton Park, MD;  Location: [facility] ORS;  Service: Orthopedics;  Laterality: Right;    Family History  Problem Relation Age of Onset  . Heart disease Mother   . Hyperlipidemia Mother   . Hypertension Mother   . Cancer Father        lung, prostate  . Hyperlipidemia Father   . Hypertension Father   . Prostate cancer Father   . Bladder Cancer Neg Hx   . Kidney cancer Neg Hx     SOCIAL HX: reviewed.    Current Outpatient Medications:  .  acetaminophen (TYLENOL) 500 MG tablet, Take 500 mg by mouth every 6 (six) hours as needed., Disp: , Rfl:  .  amLODipine (NORVASC) 5 MG tablet, Take 1 tablet (5 mg total) by mouth daily., Disp: 30 tablet, Rfl: 3 .  aspirin 325 MG EC tablet, Take 325 mg by mouth daily., Disp: , Rfl:  .  atorvastatin (LIPITOR) 20 MG tablet, Take 1 tablet (20 mg total) daily by mouth., Disp: 30 tablet, Rfl: 0 .  cetirizine (ZYRTEC) 10 MG tablet, Take 1 tablet (10 mg total)  by mouth daily., Disp: 30 tablet, Rfl: 0 .  CRANBERRY EXTRACT PO, Take 1 capsule by mouth 2 (two) times daily., Disp: , Rfl:  .  hydrochlorothiazide (HYDRODIURIL) 12.5 MG tablet, Take 1 tablet by mouth daily., Disp: , Rfl:  .  losartan (COZAAR) 100 MG tablet, Take 1 tablet by mouth daily., Disp: , Rfl:  .  Probiotic Product (ALIGN PO), Take by mouth as needed., Disp: , Rfl:  .  triamcinolone (NASACORT) 55 MCG/ACT nasal inhaler, Place 2 sprays into the nose as needed., Disp: 1 Inhaler, Rfl: 5  EXAM:  GENERAL: alert, oriented, appears well and in no acute distress  HEENT: atraumatic, conjunttiva clear, no obvious abnormalities on inspection of  external nose and ears  NECK: normal movements of the head and neck  LUNGS: on inspection no signs of respiratory distress, breathing rate appears normal, no obvious gross SOB, gasping or wheezing  CV: no obvious cyanosis  PSYCH/NEURO: pleasant and cooperative, no obvious depression or anxiety, speech and thought processing grossly intact  ASSESSMENT AND PLAN:  Discussed the following assessment and plan:  Abnormal liver function tests  Environmental allergies  Hypercholesterolemia  Essential hypertension  Midline low back pain without sciatica, unspecified chronicity  Abnormal liver function tests Follow liver panel.    Environmental allergies Controlled.    Hypercholesterolemia On lipitor.  Low cholesterol diet and exercise.  Follow lipid panel and liver function tests.    Hypertension Blood pressure elevated as outlined.  Increase amlodipine to 5mg  q day.  Follow pressures.  Follow metabolic panel.   Low back pain Saw ortho.  Pain better.  On meloxicam.  Discussed possible risk and side effects of medication.  She will decrease to qod and see if can decrease to off.  Follow.      I discussed the assessment and treatment plan with the patient. The patient was provided an opportunity to ask questions and all were answered. The patient agreed with the plan and demonstrated an understanding of the instructions.   The patient was advised to call back or seek an in-person evaluation if the symptoms worsen or if the condition fails to improve as anticipated.    Einar Pheasant, MD

## 2019-04-08 ENCOUNTER — Encounter: Payer: Self-pay | Admitting: Internal Medicine

## 2019-04-08 NOTE — Assessment & Plan Note (Signed)
Follow liver panel.  

## 2019-04-08 NOTE — Assessment & Plan Note (Signed)
Controlled.  

## 2019-04-08 NOTE — Assessment & Plan Note (Signed)
Blood pressure elevated as outlined.  Increase amlodipine to 5mg  q day.  Follow pressures.  Follow metabolic panel.

## 2019-04-08 NOTE — Assessment & Plan Note (Signed)
Saw ortho.  Pain better.  On meloxicam.  Discussed possible risk and side effects of medication.  She will decrease to qod and see if can decrease to off.  Follow.

## 2019-04-08 NOTE — Assessment & Plan Note (Signed)
On lipitor.  Low cholesterol diet and exercise.  Follow lipid panel and liver function tests.   

## 2019-04-20 ENCOUNTER — Other Ambulatory Visit: Payer: Self-pay | Admitting: Internal Medicine

## 2019-05-10 ENCOUNTER — Telehealth: Payer: Self-pay

## 2019-05-10 DIAGNOSIS — R945 Abnormal results of liver function studies: Secondary | ICD-10-CM

## 2019-05-10 DIAGNOSIS — E7800 Pure hypercholesterolemia, unspecified: Secondary | ICD-10-CM

## 2019-05-10 DIAGNOSIS — R7989 Other specified abnormal findings of blood chemistry: Secondary | ICD-10-CM

## 2019-05-10 DIAGNOSIS — I10 Essential (primary) hypertension: Secondary | ICD-10-CM

## 2019-05-10 NOTE — Telephone Encounter (Signed)
Order placed for labs.  Please schedule fasting labs 1-2 days before 06/2019 appt.

## 2019-05-10 NOTE — Telephone Encounter (Signed)
Copied from [place] [phone] [phone]. Topic: General - Inquiry >> May 10, 2019 11:30 AM Jamie Elliott wrote: Reason for CRM: Patient is calling stating she is not sure if she needs to schedule labs before her appt 06/27/19. Please advise  Patient call back #  [phone]

## 2019-05-18 DIAGNOSIS — D225 Melanocytic nevi of trunk: Secondary | ICD-10-CM | POA: Diagnosis not present

## 2019-05-18 DIAGNOSIS — X32XXXA Exposure to sunlight, initial encounter: Secondary | ICD-10-CM | POA: Diagnosis not present

## 2019-05-18 DIAGNOSIS — L821 Other seborrheic keratosis: Secondary | ICD-10-CM | POA: Diagnosis not present

## 2019-05-18 DIAGNOSIS — L814 Other melanin hyperpigmentation: Secondary | ICD-10-CM | POA: Diagnosis not present

## 2019-05-18 NOTE — Telephone Encounter (Signed)
Pt scheduled  

## 2019-06-26 ENCOUNTER — Other Ambulatory Visit: Payer: Self-pay

## 2019-06-26 ENCOUNTER — Other Ambulatory Visit (INDEPENDENT_AMBULATORY_CARE_PROVIDER_SITE_OTHER): Payer: Medicare Other

## 2019-06-26 DIAGNOSIS — R7989 Other specified abnormal findings of blood chemistry: Secondary | ICD-10-CM

## 2019-06-26 DIAGNOSIS — I10 Essential (primary) hypertension: Secondary | ICD-10-CM

## 2019-06-26 DIAGNOSIS — E7800 Pure hypercholesterolemia, unspecified: Secondary | ICD-10-CM | POA: Diagnosis not present

## 2019-06-26 DIAGNOSIS — R945 Abnormal results of liver function studies: Secondary | ICD-10-CM | POA: Diagnosis not present

## 2019-06-26 LAB — HEPATIC FUNCTION PANEL
ALT: 19 U/L (ref 0–35)
AST: 20 U/L (ref 0–37)
Albumin: 4.2 g/dL (ref 3.5–5.2)
Alkaline Phosphatase: 88 U/L (ref 39–117)
Bilirubin, Direct: 0.1 mg/dL (ref 0.0–0.3)
Total Bilirubin: 0.4 mg/dL (ref 0.2–1.2)
Total Protein: 6.5 g/dL (ref 6.0–8.3)

## 2019-06-26 LAB — BASIC METABOLIC PANEL
BUN: 14 mg/dL (ref 6–23)
CO2: 29 mEq/L (ref 19–32)
Calcium: 9.9 mg/dL (ref 8.4–10.5)
Chloride: 105 mEq/L (ref 96–112)
Creatinine, Ser: 0.8 mg/dL (ref 0.40–1.20)
GFR: 70.23 mL/min (ref >60.00)
Glucose, Bld: 82 mg/dL (ref 70–99)
Potassium: 4.4 mEq/L (ref 3.5–5.1)
Sodium: 141 mEq/L (ref 135–145)

## 2019-06-26 LAB — LIPID PANEL
Cholesterol: 183 mg/dL (ref 0–200)
HDL: 61 mg/dL (ref >39.00)
LDL Cholesterol: 101 mg/dL — ABNORMAL HIGH (ref 0–99)
NonHDL: 121.7
Total CHOL/HDL Ratio: 3
Triglycerides: 106 mg/dL (ref 0.0–149.0)
VLDL: 21.2 mg/dL (ref 0.0–40.0)

## 2019-06-27 ENCOUNTER — Ambulatory Visit (INDEPENDENT_AMBULATORY_CARE_PROVIDER_SITE_OTHER): Payer: Medicare Other | Admitting: Internal Medicine

## 2019-06-27 ENCOUNTER — Encounter: Payer: Self-pay | Admitting: Internal Medicine

## 2019-06-27 VITALS — BP 112/58 | HR 82 | Temp 98.1°F | Resp 16 | Wt 120.0 lb

## 2019-06-27 DIAGNOSIS — T7840XD Allergy, unspecified, subsequent encounter: Secondary | ICD-10-CM

## 2019-06-27 DIAGNOSIS — E7800 Pure hypercholesterolemia, unspecified: Secondary | ICD-10-CM

## 2019-06-27 DIAGNOSIS — R945 Abnormal results of liver function studies: Secondary | ICD-10-CM

## 2019-06-27 DIAGNOSIS — Z9109 Other allergy status, other than to drugs and biological substances: Secondary | ICD-10-CM

## 2019-06-27 DIAGNOSIS — R7989 Other specified abnormal findings of blood chemistry: Secondary | ICD-10-CM

## 2019-06-27 DIAGNOSIS — I10 Essential (primary) hypertension: Secondary | ICD-10-CM | POA: Diagnosis not present

## 2019-06-27 DIAGNOSIS — Z0000 Encounter for general adult medical examination without abnormal findings: Secondary | ICD-10-CM | POA: Diagnosis not present

## 2019-06-27 DIAGNOSIS — Z1239 Encounter for other screening for malignant neoplasm of breast: Secondary | ICD-10-CM

## 2019-06-27 NOTE — Progress Notes (Signed)
Patient ID: Jamie Elliott, female   DOB: 07/08/1946, 73 y.o.   MRN: [id number]   Subjective:    Patient ID: Jamie Elliott, female    DOB: 1946-02-14, 73 y.o.   MRN: [id number]  HPI  Patient here for her physical exam.  She reports she is doing relatively well.  Trying to stay in due to covid restrictions. No fever.  No chest congestion, cough or sob.  Tries to stay active.  No chest pain.  No acid reflux.  No abdominal pain.  Bowels moving.  Discussed recent labs.  Taking lipitor.  Increased amlodipine to 5mg  at the 03/2019 visit.  Blood pressure doing better.  Discussed colonoscopy.  She will call with name if GI physician.  She had reaction to prevnar.  Had questions about pneumovax and other immunizations.  We discussed these were different vaccines.  Hesitant to proceed with other vaccines.  Request referral to allergist.     Past Medical History:  Diagnosis Date  . Environmental allergies   . Hypercholesterolemia   . Hypertension   . Migraines    Past Surgical History:  Procedure Laterality Date  . ABDOMINAL HYSTERECTOMY  2000  . APPENDECTOMY  2000  . NOSE SURGERY  1967  . SEPTOPLASTY    . SHOULDER ARTHROSCOPY WITH OPEN ROTATOR CUFF REPAIR Right 12/05/2015   Procedure: SHOULDER ARTHROSCOPY WITH MINI OPEN ROTATOR CUFF REPAIR. DISTAL CLAVICLE EXCISION;  Surgeon: Thornton Park, MD;  Location: [facility] ORS;  Service: Orthopedics;  Laterality: Right;   Family History  Problem Relation Age of Onset  . Heart disease Mother   . Hyperlipidemia Mother   . Hypertension Mother   . Cancer Father        lung, prostate  . Hyperlipidemia Father   . Hypertension Father   . Prostate cancer Father   . Bladder Cancer Neg Hx   . Kidney cancer Neg Hx    Social History   Socioeconomic History  . Marital status: Married    Spouse name: Not on file  . Number of children: 2  . Years of education: Not on file  . Highest education level: Not on file  Occupational History  . Not on file  Social  Needs  . Financial resource strain: Not on file  . Food insecurity    Worry: Not on file    Inability: Not on file  . Transportation needs    Medical: Not on file    Non-medical: Not on file  Tobacco Use  . Smoking status: Never Smoker  . Smokeless tobacco: Never Used  Substance and Sexual Activity  . Alcohol use: No    Alcohol/week: 0.0 standard drinks  . Drug use: No  . Sexual activity: Not Currently  Lifestyle  . Physical activity    Days per week: Not on file    Minutes per session: Not on file  . Stress: Not on file  Relationships  . Social Herbalist on phone: Not on file    Gets together: Not on file    Attends religious service: Not on file    Active member of club or organization: Not on file    Attends meetings of clubs or organizations: Not on file    Relationship status: Not on file  Other Topics Concern  . Not on file  Social History Narrative  . Not on file    Outpatient Encounter Medications as of 06/27/2019  Medication Sig  . acetaminophen (TYLENOL) 500 MG tablet  Take 500 mg by mouth every 6 (six) hours as needed.  Marland Kitchen amLODipine (NORVASC) 5 MG tablet Take 1 tablet (5 mg total) by mouth daily.  Marland Kitchen aspirin 325 MG EC tablet Take 325 mg by mouth daily.  Marland Kitchen atorvastatin (LIPITOR) 20 MG tablet Take 1 tablet (20 mg total) daily by mouth.  . cetirizine (ZYRTEC) 10 MG tablet Take 1 tablet (10 mg total) by mouth daily.  Marland Kitchen CRANBERRY EXTRACT PO Take 1 capsule by mouth 2 (two) times daily.  . Probiotic Product (ALIGN PO) Take by mouth as needed.  . triamcinolone (NASACORT) 55 MCG/ACT nasal inhaler Place 2 sprays into the nose as needed.  . [DISCONTINUED] hydrochlorothiazide (HYDRODIURIL) 12.5 MG tablet Take 1 tablet by mouth daily.  . [DISCONTINUED] losartan (COZAAR) 100 MG tablet Take 1 tablet by mouth daily.   No facility-administered encounter medications on file as of 06/27/2019.     Review of Systems  Constitutional: Negative for appetite change and  unexpected weight change.  HENT: Negative for congestion and sinus pressure.   Eyes: Negative for pain and visual disturbance.  Respiratory: Negative for cough, chest tightness and shortness of breath.   Cardiovascular: Negative for chest pain, palpitations and leg swelling.  Gastrointestinal: Negative for abdominal pain, diarrhea, nausea and vomiting.  Genitourinary: Negative for difficulty urinating and dysuria.  Musculoskeletal: Negative for joint swelling and myalgias.  Skin: Negative for color change and rash.  Neurological: Negative for dizziness, light-headedness and headaches.  Hematological: Negative for adenopathy. Does not bruise/bleed easily.  Psychiatric/Behavioral: Negative for agitation and dysphoric mood.       Objective:    Physical Exam Constitutional:      Appearance: Normal appearance. She is well-developed.  HENT:     Right Ear: External ear normal.     Left Ear: External ear normal.  Eyes:     General: No scleral icterus.       Right eye: No discharge.        Left eye: No discharge.     Conjunctiva/sclera: Conjunctivae normal.  Neck:     Musculoskeletal: Neck supple. No muscular tenderness.     Thyroid: No thyromegaly.  Cardiovascular:     Rate and Rhythm: Normal rate and regular rhythm.  Pulmonary:     Effort: No tachypnea, accessory muscle usage or respiratory distress.     Breath sounds: Normal breath sounds. No decreased breath sounds or wheezing.  Chest:     Breasts:        Right: No inverted nipple, mass, nipple discharge or tenderness (no axillary adenopathy).        Left: No inverted nipple, mass, nipple discharge or tenderness (no axilarry adenopathy).  Abdominal:     General: Bowel sounds are normal.     Palpations: Abdomen is soft.     Tenderness: There is no abdominal tenderness.  Musculoskeletal:        General: No swelling or tenderness.  Lymphadenopathy:     Cervical: No cervical adenopathy.  Skin:    Findings: No erythema or rash.   Neurological:     Mental Status: She is alert and oriented to person, place, and time.  Psychiatric:        Mood and Affect: Mood normal.        Behavior: Behavior normal.     BP (!) 112/58   Pulse 82   Temp 98.1 F (36.7 C) (Oral)   Resp 16   Wt 120 lb (54.4 kg)   LMP 11/28/1998  SpO2 97%   BMI 22.31 kg/m  Wt Readings from Last 3 Encounters:  06/27/19 120 lb (54.4 kg)  12/07/18 122 lb 3.2 oz (55.4 kg)  10/06/18 121 lb 6.4 oz (55.1 kg)     Lab Results  Component Value Date   WBC 6.6 07/20/2018   HGB 12.9 07/20/2018   HCT 38.2 07/20/2018   PLT 224.0 07/20/2018   GLUCOSE 82 06/26/2019   CHOL 183 06/26/2019   TRIG 106.0 06/26/2019   HDL 61.00 06/26/2019   LDLDIRECT 103.0 05/21/2015   LDLCALC 101 (H) 06/26/2019   ALT 19 06/26/2019   AST 20 06/26/2019   NA 141 06/26/2019   K 4.4 06/26/2019   CL 105 06/26/2019   CREATININE 0.80 06/26/2019   BUN 14 06/26/2019   CO2 29 06/26/2019   TSH 1.97 07/20/2018   INR 1.00 11/25/2015    Dg Abd 1 View  Result Date: 01/11/2018 CLINICAL DATA:  Low back pain and urinary tract infections for the past 2 months. No nausea, vomiting, or fever. EXAM: ABDOMEN - 1 VIEW COMPARISON:  None in PACs FINDINGS: The colonic stool burden is mildly increased. There no small or large bowel obstructive pattern. There are no abnormal soft tissue calcifications. There are pelvic phleboliths. There is gentle levocurvature centered in the mid lumbar spine. Mild degenerative disc disease is noted in the upper and lower lumbar spine. IMPRESSION: Mildly increased colonic stool burden may reflect constipation in the appropriate clinical setting. No acute intra-abdominal abnormality is observed. Mild degenerative disc change with scoliosis of the lumbar spine. Electronically Signed   By: David  Martinique M.D.   On: 01/11/2018 11:31       Assessment & Plan:   Problem List Items Addressed This Visit    Abnormal liver function tests    Follow liver panel.        Allergy    Had allergic reaction to prevnar.  Discussed other immunizations. She is hesitant to receive other immunizations.  Request referral to allergist for evaluation.        Relevant Orders   Ambulatory referral to Allergy   Environmental allergies    Appear to be reasonably controlled.       Health care maintenance    Physical today 06/27/19.  PAP 04/05/13 - ok.  Colonoscopy 11/2011      Hypercholesterolemia    On lipitor.  Low cholesterol diet and exercise.  Follow lipid panel and liver function tests.        Relevant Orders   Hepatic function panel   Lipid panel   Hypertension    Blood pressure doing better.  Continue current medication regimen.  Follow pressures.  Follow metabolic panel.        Relevant Orders   CBC with Differential/Platelet   TSH   Basic metabolic panel    Other Visit Diagnoses    Breast cancer screening       Relevant Orders   MM 3D SCREEN BREAST BILATERAL       Einar Pheasant, MD

## 2019-06-27 NOTE — Assessment & Plan Note (Signed)
Physical today 06/27/19.  PAP 04/05/13 - ok.  Colonoscopy 11/2011

## 2019-06-30 ENCOUNTER — Other Ambulatory Visit: Payer: Self-pay | Admitting: Internal Medicine

## 2019-07-02 ENCOUNTER — Encounter: Payer: Self-pay | Admitting: Internal Medicine

## 2019-07-02 DIAGNOSIS — T7840XA Allergy, unspecified, initial encounter: Secondary | ICD-10-CM | POA: Insufficient documentation

## 2019-07-02 NOTE — Assessment & Plan Note (Signed)
On lipitor.  Low cholesterol diet and exercise.  Follow lipid panel and liver function tests.   

## 2019-07-02 NOTE — Assessment & Plan Note (Signed)
Blood pressure doing better.  Continue current medication regimen.  Follow pressures.  Follow metabolic panel.

## 2019-07-02 NOTE — Assessment & Plan Note (Signed)
Follow liver panel.  

## 2019-07-02 NOTE — Assessment & Plan Note (Signed)
Appear to be reasonably controlled.

## 2019-07-02 NOTE — Assessment & Plan Note (Signed)
Had allergic reaction to prevnar.  Discussed other immunizations. She is hesitant to receive other immunizations.  Request referral to allergist for evaluation.

## 2019-07-17 ENCOUNTER — Other Ambulatory Visit: Payer: Self-pay | Admitting: Internal Medicine

## 2019-07-31 ENCOUNTER — Telehealth: Payer: Self-pay

## 2019-07-31 NOTE — Telephone Encounter (Signed)
Copied from [place] [id number]. Topic: General - Other >> Jul 31, 2019  4:02 PM Leward Quan A wrote: Reason for CRM: Patient called to request a call regarding an appointment letter she received in the mail. Asking for the nurse to call her at Ph# [phone]

## 2019-08-01 NOTE — Telephone Encounter (Signed)
Pt stated she was speaking with Dr. Bary Leriche nurse earlier today and she needs to speak with her again. Pt requests call back. Cb# [phone]

## 2019-08-01 NOTE — Telephone Encounter (Signed)
Patient was calling to discuss referral to allergist. Stated that she wanted to stay in town to be seen. Advised that per referral note, [facility] declined referral and patient had to be referred to [facility].

## 2019-08-03 NOTE — Telephone Encounter (Signed)
Pt called back after just speaking to nurse, now after hrs, has one more question to ask. Stated to pt that she would need to wait for return call on 8/14 FU [phone] pertaining to this CRM

## 2019-08-03 NOTE — Telephone Encounter (Signed)
Patient requesting call back from [place]. She states she has not heard back yet.

## 2019-08-03 NOTE — Telephone Encounter (Signed)
Patient needed clarification on referral. Stated [facility]. Pt is agreeable

## 2019-08-04 NOTE — Telephone Encounter (Signed)
noted 

## 2019-08-04 NOTE — Telephone Encounter (Signed)
Pt does not need a call back from the nurse. Pt states that she has had all her questions answered.

## 2019-08-21 ENCOUNTER — Other Ambulatory Visit: Payer: Self-pay | Admitting: Internal Medicine

## 2019-08-25 DIAGNOSIS — H2513 Age-related nuclear cataract, bilateral: Secondary | ICD-10-CM | POA: Diagnosis not present

## 2019-09-11 ENCOUNTER — Other Ambulatory Visit: Payer: Self-pay | Admitting: Internal Medicine

## 2019-09-12 ENCOUNTER — Other Ambulatory Visit: Payer: Self-pay

## 2019-09-12 ENCOUNTER — Ambulatory Visit (INDEPENDENT_AMBULATORY_CARE_PROVIDER_SITE_OTHER): Payer: Medicare Other | Admitting: Internal Medicine

## 2019-09-12 ENCOUNTER — Encounter: Payer: Self-pay | Admitting: Internal Medicine

## 2019-09-12 VITALS — Temp 99.2°F | Wt 120.0 lb

## 2019-09-12 DIAGNOSIS — R509 Fever, unspecified: Secondary | ICD-10-CM | POA: Diagnosis not present

## 2019-09-12 NOTE — Progress Notes (Signed)
Patient ID: Jamie Elliott, female   DOB: 07-05-1946, 73 y.o.   MRN: [id number]   Virtual Visit via video Note  This visit type was conducted due to national recommendations for restrictions regarding the COVID-19 pandemic (e.g. social distancing).  This format is felt to be most appropriate for this patient at this time.  All issues noted in this document were discussed and addressed.  No physical exam was performed (except for noted visual exam findings with Video Visits).   I connected with [name] by a video enabled telemedicine application and verified that I am speaking with the correct person using two identifiers. Location patient: home Location provider: work  Persons participating in the virtual visit: patient, provider  I discussed the limitations, risks, security and privacy concerns of performing an evaluation and management service by video and the availability of in person appointments. The patient expressed understanding and agreed to proceed.   Reason for visit: work in appt.   HPI: She reports noticed a dry throat two days ago.  Noticed minimal headache and chills the following day.  Fever.  Tmax 100.2.  99.2 this am.  Some sinus pressure. Increased drainage.  No nausea, vomiting or diarrhea. No chest pain, chest tightness or sob.  Had her flu shot 08/23/19.  Has been trying to stay in.  Her son (who lives with her) works at [employer].  No one else in house has been sick.    ROS: See pertinent positives and negatives per HPI.  Past Medical History:  Diagnosis Date  . Environmental allergies   . Hypercholesterolemia   . Hypertension   . Migraines     Past Surgical History:  Procedure Laterality Date  . ABDOMINAL HYSTERECTOMY  2000  . APPENDECTOMY  2000  . NOSE SURGERY  1967  . SEPTOPLASTY    . SHOULDER ARTHROSCOPY WITH OPEN ROTATOR CUFF REPAIR Right 12/05/2015   Procedure: SHOULDER ARTHROSCOPY WITH MINI OPEN ROTATOR CUFF REPAIR. DISTAL CLAVICLE EXCISION;   Surgeon: Thornton Park, MD;  Location: [facility] ORS;  Service: Orthopedics;  Laterality: Right;    Family History  Problem Relation Age of Onset  . Heart disease Mother   . Hyperlipidemia Mother   . Hypertension Mother   . Cancer Father        lung, prostate  . Hyperlipidemia Father   . Hypertension Father   . Prostate cancer Father   . Bladder Cancer Neg Hx   . Kidney cancer Neg Hx     SOCIAL HX: reviewed.    Current Outpatient Medications:  .  acetaminophen (TYLENOL) 500 MG tablet, Take 500 mg by mouth every 6 (six) hours as needed., Disp: , Rfl:  .  amLODipine (NORVASC) 5 MG tablet, Take 1 tablet (5 mg total) by mouth daily., Disp: 30 tablet, Rfl: 0 .  aspirin 325 MG EC tablet, Take 325 mg by mouth daily., Disp: , Rfl:  .  atorvastatin (LIPITOR) 20 MG tablet, Take 1 tablet (20 mg total) daily by mouth., Disp: 30 tablet, Rfl: 0 .  cetirizine (ZYRTEC) 10 MG tablet, Take 1 tablet (10 mg total) by mouth daily., Disp: 30 tablet, Rfl: 0 .  CRANBERRY EXTRACT PO, Take 1 capsule by mouth 2 (two) times daily., Disp: , Rfl:  .  hydrochlorothiazide (HYDRODIURIL) 12.5 MG tablet, TAKE ONE TABLET DAILY., Disp: 90 tablet, Rfl: 1 .  losartan (COZAAR) 100 MG tablet, Take 1 tablet by mouth daily., Disp: 90 tablet, Rfl: 1 .  Probiotic Product (ALIGN PO), Take  by mouth as needed., Disp: , Rfl:  .  triamcinolone (NASACORT) 55 MCG/ACT nasal inhaler, Place 2 sprays into the nose as needed., Disp: 1 Inhaler, Rfl: 5 .  doxycycline (VIBRA-TABS) 100 MG tablet, Take 1 tablet (100 mg total) by mouth 2 (two) times daily., Disp: 20 tablet, Rfl: 0  EXAM:  VITALS per patient if applicable: temp [id number]  GENERAL: alert, oriented, appears well and in no acute distress  HEENT: atraumatic, conjunttiva clear, no obvious abnormalities on inspection of external nose and ears  NECK: normal movements of the head and neck  LUNGS: on inspection no signs of respiratory distress, breathing rate appears normal, no obvious  gross SOB, gasping or wheezing  CV: no obvious cyanosis  PSYCH/NEURO: pleasant and cooperative, no obvious depression or anxiety, speech and thought processing grossly intact  ASSESSMENT AND PLAN:  Discussed the following assessment and plan:  Fever Low grade fever with some sinus pressure, congestion and drainage.  Hold on abx.  May be early sinus infection.  Given fever and congestion, will check covid.  nasacort nasal spray and robitussin as directed.  Follow symptoms closely.  Notify me if any change or worsening symptoms.  Discussed the need for self quarantine.     I discussed the assessment and treatment plan with the patient. The patient was provided an opportunity to ask questions and all were answered. The patient agreed with the plan and demonstrated an understanding of the instructions.   The patient was advised to call back or seek an in-person evaluation if the symptoms worsen or if the condition fails to improve as anticipated.   Einar Pheasant, MD

## 2019-09-14 ENCOUNTER — Other Ambulatory Visit: Payer: Self-pay

## 2019-09-14 DIAGNOSIS — R6889 Other general symptoms and signs: Secondary | ICD-10-CM | POA: Diagnosis not present

## 2019-09-14 DIAGNOSIS — Z20822 Contact with and (suspected) exposure to covid-19: Secondary | ICD-10-CM

## 2019-09-15 ENCOUNTER — Ambulatory Visit (INDEPENDENT_AMBULATORY_CARE_PROVIDER_SITE_OTHER): Payer: Medicare Other | Admitting: Internal Medicine

## 2019-09-15 ENCOUNTER — Other Ambulatory Visit: Payer: Self-pay

## 2019-09-15 ENCOUNTER — Encounter: Payer: Self-pay | Admitting: Internal Medicine

## 2019-09-15 DIAGNOSIS — R509 Fever, unspecified: Secondary | ICD-10-CM | POA: Diagnosis not present

## 2019-09-15 DIAGNOSIS — I10 Essential (primary) hypertension: Secondary | ICD-10-CM

## 2019-09-15 LAB — NOVEL CORONAVIRUS, NAA: SARS-CoV-2, NAA: NOT DETECTED

## 2019-09-15 MED ORDER — DOXYCYCLINE HYCLATE 100 MG PO TABS: 100.0000 mg | ORAL_TABLET | Freq: Two times a day (BID) | ORAL | 0 refills | Status: DC

## 2019-09-17 ENCOUNTER — Encounter: Payer: Self-pay | Admitting: Internal Medicine

## 2019-09-17 DIAGNOSIS — R509 Fever, unspecified: Secondary | ICD-10-CM | POA: Insufficient documentation

## 2019-09-17 NOTE — Assessment & Plan Note (Signed)
Low grade fever with some sinus pressure, congestion and drainage.  Hold on abx.  May be early sinus infection.  Given fever and congestion, will check covid.  nasacort nasal spray and robitussin as directed.  Follow symptoms closely.  Notify me if any change or worsening symptoms.  Discussed the need for self quarantine.

## 2019-09-17 NOTE — Assessment & Plan Note (Signed)
Low grade fever. Some minimal sinus pressure and congestion. Symptoms improved today.  Tmax 99.2.  Continue robitussin, saline nasal spray and steroid nasal spray.  Will send in rx for doxycycline to be taken only if symptoms worsen.  Rest.  Fluids.  Follow.  Continue self quarantine.  Call with update.

## 2019-09-17 NOTE — Assessment & Plan Note (Signed)
Blood pressure has been doing well.  Follow pressures.  Follow metabolic panel.   

## 2019-09-17 NOTE — Progress Notes (Signed)
Patient ID: Jamie Elliott, female   DOB: 12-20-1946, 73 y.o.   MRN: [id number]   Virtual Visit via video Note  This visit type was conducted due to national recommendations for restrictions regarding the COVID-19 pandemic (e.g. social distancing).  This format is felt to be most appropriate for this patient at this time.  All issues noted in this document were discussed and addressed.  No physical exam was performed (except for noted visual exam findings with Video Visits).   I connected with Jamie Elliott by a video enabled telemedicine application and verified that I am speaking with the correct person using two identifiers. Location patient: home Location provider: work Persons participating in the virtual visit: patient, provider  I discussed the limitations, risks, security and privacy concerns of performing an evaluation and management service by telephone and the availability of in person appointments.  The patient expressed understanding and agreed to proceed.   Reason for visit: work in appt.    HPI: Was seen recently for congestion and low grade fever.  Was concerned because of persistent symptoms.  Reports minimal ear fullness and minimal sinus pressure.  Some nasal congestion.  No sore throat.  Temperature today 99.3.  No chest congestion or sob.  No chest pain.  Feels some better today. Recent covid test negative.     ROS: See pertinent positives and negatives per HPI.  Past Medical History:  Diagnosis Date  . Environmental allergies   . Hypercholesterolemia   . Hypertension   . Migraines     Past Surgical History:  Procedure Laterality Date  . ABDOMINAL HYSTERECTOMY  2000  . APPENDECTOMY  2000  . NOSE SURGERY  1967  . SEPTOPLASTY    . SHOULDER ARTHROSCOPY WITH OPEN ROTATOR CUFF REPAIR Right 12/05/2015   Procedure: SHOULDER ARTHROSCOPY WITH MINI OPEN ROTATOR CUFF REPAIR. DISTAL CLAVICLE EXCISION;  Surgeon: Thornton Park, MD;  Location: [facility] OR[facility];  Service:  Orthopedics;  Laterality: Right;    Family History  Problem Relation Age of Onset  . Heart disease Mother   . Hyperlipidemia Mother   . Hypertension Mother   . Cancer Father        lung, prostate  . Hyperlipidemia Father   . Hypertension Father   . Prostate cancer Father   . Bladder Cancer Neg Hx   . Kidney cancer Neg Hx     SOCIAL HX: reviewed.    Current Outpatient Medications:  .  acetaminophen (TYLENOL) 500 MG tablet, Take 500 mg by mouth every 6 (six) hours as needed., Disp: , Rfl:  .  amLODipine (NORVASC) 5 MG tablet, Take 1 tablet (5 mg total) by mouth daily., Disp: 30 tablet, Rfl: 0 .  aspirin 325 MG EC tablet, Take 325 mg by mouth daily., Disp: , Rfl:  .  atorvastatin (LIPITOR) 20 MG tablet, Take 1 tablet (20 mg total) daily by mouth., Disp: 30 tablet, Rfl: 0 .  cetirizine (ZYRTEC) 10 MG tablet, Take 1 tablet (10 mg total) by mouth daily., Disp: 30 tablet, Rfl: 0 .  CRANBERRY EXTRACT PO, Take 1 capsule by mouth 2 (two) times daily., Disp: , Rfl:  .  doxycycline (VIBRA-TABS) 100 MG tablet, Take 1 tablet (100 mg total) by mouth 2 (two) times daily., Disp: 20 tablet, Rfl: 0 .  hydrochlorothiazide (HYDRODIURIL) 12.5 MG tablet, TAKE ONE TABLET DAILY., Disp: 90 tablet, Rfl: 1 .  losartan (COZAAR) 100 MG tablet, Take 1 tablet by mouth daily., Disp: 90 tablet, Rfl: 1 .  Probiotic Product (  ALIGN PO), Take by mouth as needed., Disp: , Rfl:  .  triamcinolone (NASACORT) 55 MCG/ACT nasal inhaler, Place 2 sprays into the nose as needed., Disp: 1 Inhaler, Rfl: 5  EXAM:  VITALS per patient if applicable: A999333.    GENERAL: alert, oriented, appears well and in no acute distress  HEENT: atraumatic, conjunttiva clear, no obvious abnormalities on inspection of external nose and ears  NECK: normal movements of the head and neck  LUNGS: on inspection no signs of respiratory distress, breathing rate appears normal, no obvious gross SOB, gasping or wheezing  CV: no obvious cyanosis   PSYCH/NEURO: pleasant and cooperative, no obvious depression or anxiety, speech and thought processing grossly intact  ASSESSMENT AND PLAN:  Discussed the following assessment and plan:  Fever Low grade fever. Some minimal sinus pressure and congestion. Symptoms improved today.  Tmax 99.2.  Continue robitussin, saline nasal spray and steroid nasal spray.  Will send in rx for doxycycline to be taken only if symptoms worsen.  Rest.  Fluids.  Follow.  Continue self quarantine.  Call with update.    Hypertension Blood pressure has been doing well.  Follow pressures.  Follow metabolic panel.      I discussed the assessment and treatment plan with the patient. The patient was provided an opportunity to ask questions and all were answered. The patient agreed with the plan and demonstrated an understanding of the instructions.   The patient was advised to call back or seek an in-person evaluation if the symptoms worsen or if the condition fails to improve as anticipated.   Einar Pheasant, MD

## 2019-09-21 ENCOUNTER — Encounter: Payer: Self-pay | Admitting: Internal Medicine

## 2019-10-02 ENCOUNTER — Encounter: Payer: Self-pay | Admitting: Internal Medicine

## 2019-10-02 NOTE — Telephone Encounter (Signed)
Please clarify with pt her reaction to previous pneumonia vaccine.  Per note, she had pneumovax.  If able, she would ger prevnar this time.  She was sick end of 08/2019.  Need to confirm no symptoms now.  Also, would need to coordinate with nurse - nurse visit.

## 2019-10-04 NOTE — Telephone Encounter (Signed)
Patients arm got red and swollen at the site of injection. No other acute symptoms. Patient cleared it with her allergy doctor. They said it would be fine but she will need to stay at the office for about 30 minutes after injection. She is not having any symptoms of being sick. Are you ok with her doing a nurse visit since she has to sit here or would you prefer to give it to her during a visit with you?

## 2019-10-04 NOTE — Telephone Encounter (Signed)
See if Jamie Elliott (or nurse doing nurse visit), ok with giving vaccine and then having pt stay for 30 minutes to monitor.

## 2019-10-05 NOTE — Telephone Encounter (Signed)
Jamie Elliott, are you okay with you or donna doing this as a nurse visit and monitoring for 30 minutes after? I know Dr Nicki Reaper will need to be in office when done.

## 2019-10-09 NOTE — Telephone Encounter (Signed)
Yes ok to schedule we put her in the procedure room.

## 2019-10-10 NOTE — Telephone Encounter (Signed)
Pt called and scheduled

## 2019-10-10 NOTE — Telephone Encounter (Signed)
Please schedule patient for a pneumonia shot. Cannot be done unless Dr Nicki Reaper is in office.

## 2019-10-17 ENCOUNTER — Other Ambulatory Visit: Payer: Self-pay

## 2019-10-17 ENCOUNTER — Ambulatory Visit (INDEPENDENT_AMBULATORY_CARE_PROVIDER_SITE_OTHER): Payer: Medicare Other | Admitting: *Deleted

## 2019-10-17 DIAGNOSIS — Z23 Encounter for immunization: Secondary | ICD-10-CM

## 2019-11-08 ENCOUNTER — Ambulatory Visit (INDEPENDENT_AMBULATORY_CARE_PROVIDER_SITE_OTHER): Payer: Medicare Other | Admitting: Internal Medicine

## 2019-11-08 ENCOUNTER — Telehealth: Payer: Self-pay | Admitting: Internal Medicine

## 2019-11-08 ENCOUNTER — Encounter: Payer: Self-pay | Admitting: Internal Medicine

## 2019-11-08 ENCOUNTER — Other Ambulatory Visit: Payer: Self-pay

## 2019-11-08 ENCOUNTER — Other Ambulatory Visit: Payer: Self-pay | Admitting: Internal Medicine

## 2019-11-08 DIAGNOSIS — Z20822 Contact with and (suspected) exposure to covid-19: Secondary | ICD-10-CM

## 2019-11-08 DIAGNOSIS — R0981 Nasal congestion: Secondary | ICD-10-CM | POA: Diagnosis not present

## 2019-11-08 NOTE — Progress Notes (Signed)
Patient ID: Jamie Elliott, female   DOB: 20-Jan-1946, 73 y.o.   MRN: [id number]   Virtual Visit via video Note  This visit type was conducted due to national recommendations for restrictions regarding the COVID-19 pandemic (e.g. social distancing).  This format is felt to be most appropriate for this patient at this time.  All issues noted in this document were discussed and addressed.  No physical exam was performed (except for noted visual exam findings with Video Visits).   I connected with Jamie Elliott by a video enabled telemedicine application and verified that I am speaking with the correct person using two identifiers. Location patient: home Location provider: work Persons participating in the virtual visit: patient, provider  I discussed the limitations, risks, security and privacy concerns of performing an evaluation and management service by video and the availability of in person appointments.  The patient expressed understanding and agreed to proceed.   Reason for visit: work in appt.   HPI: She reports noticing symptoms starting over this past week.  Felt tired.  Sore throat.  Increased post nasal drainage.  Upper mouth sore.  Has been gargling with salt water.  Taking some tylenol.  No fever.  Reports increased frontal and maxillary sinus pressure.  Some nasal congestion.  Feels like her typical sinus infection.  No chest congestion.  No sob or chest pain.  No earache.  Some cough.  Trying to stay in due to covid restrictions.  Son works in Production designer, theatre/television/film.     ROS: See pertinent positives and negatives per HPI.  Past Medical History:  Diagnosis Date  . Environmental allergies   . Hypercholesterolemia   . Hypertension   . Migraines     Past Surgical History:  Procedure Laterality Date  . ABDOMINAL HYSTERECTOMY  2000  . APPENDECTOMY  2000  . NOSE SURGERY  1967  . SEPTOPLASTY    . SHOULDER ARTHROSCOPY WITH OPEN ROTATOR CUFF REPAIR Right 12/05/2015   Procedure: SHOULDER  ARTHROSCOPY WITH MINI OPEN ROTATOR CUFF REPAIR. DISTAL CLAVICLE EXCISION;  Surgeon: Thornton Park, MD;  Location: [facility];  Service: Orthopedics;  Laterality: Right;    Family History  Problem Relation Age of Onset  . Heart disease Mother   . Hyperlipidemia Mother   . Hypertension Mother   . Cancer Father        lung, prostate  . Hyperlipidemia Father   . Hypertension Father   . Prostate cancer Father   . Bladder Cancer Neg Hx   . Kidney cancer Neg Hx     SOCIAL HX: reviewed.    Current Outpatient Medications:  .  acetaminophen (TYLENOL) 500 MG tablet, Take 500 mg by mouth every 6 (six) hours as needed., Disp: , Rfl:  .  amLODipine (NORVASC) 5 MG tablet, Take 1 tablet (5 mg total) by mouth daily., Disp: 30 tablet, Rfl: 0 .  aspirin 325 MG EC tablet, Take 325 mg by mouth daily., Disp: , Rfl:  .  atorvastatin (LIPITOR) 20 MG tablet, Take 1 tablet (20 mg total) daily by mouth., Disp: 30 tablet, Rfl: 0 .  cetirizine (ZYRTEC) 10 MG tablet, Take 1 tablet (10 mg total) by mouth daily., Disp: 30 tablet, Rfl: 0 .  CRANBERRY EXTRACT PO, Take 1 capsule by mouth 2 (two) times daily., Disp: , Rfl:  .  Diphenhyd-Hydrocort-Nystatin (FIRST-DUKES MOUTHWASH) SUSP, 5 cc's swish and spit tid, Disp: 237 mL, Rfl: 0 .  doxycycline (VIBRA-TABS) 100 MG tablet, Take 1 tablet (100 mg total) by  mouth 2 (two) times daily., Disp: 20 tablet, Rfl: 0 .  hydrochlorothiazide (HYDRODIURIL) 12.5 MG tablet, TAKE ONE TABLET DAILY., Disp: 90 tablet, Rfl: 1 .  losartan (COZAAR) 100 MG tablet, Take 1 tablet by mouth daily., Disp: 90 tablet, Rfl: 1 .  Probiotic Product (ALIGN PO), Take by mouth as needed., Disp: , Rfl:  .  triamcinolone (NASACORT) 55 MCG/ACT nasal inhaler, Place 2 sprays into the nose as needed., Disp: 1 Inhaler, Rfl: 5  EXAM:  GENERAL: alert, oriented, appears well and in no acute distress  HEENT: atraumatic, conjunttiva clear, no obvious abnormalities on inspection of external nose and ears  NECK:  normal movements of the head and neck  LUNGS: on inspection no signs of respiratory distress, breathing rate appears normal, no obvious gross SOB, gasping or wheezing  CV: no obvious cyanosis  PSYCH/NEURO: pleasant and cooperative, no obvious depression or anxiety, speech and thought processing grossly intact  ASSESSMENT AND PLAN:  Discussed the following assessment and plan:  Sinus congestion Increased sinus pressure and congestion.  Symptoms feel c/w her sinus infections.  Will treat for sinusitis.  Saline nasal spray and steroid nasal spray as directed.  Robitussin as directed.  Doxycycline.  Probiotic as directed.  Follow closely.  Will test for covid.      I discussed the assessment and treatment plan with the patient. The patient was provided an opportunity to ask questions and all were answered. The patient agreed with the plan and demonstrated an understanding of the instructions.   The patient was advised to call back or seek an in-person evaluation if the symptoms worsen or if the condition fails to improve as anticipated.   Einar Pheasant, MD

## 2019-11-08 NOTE — Telephone Encounter (Signed)
Pt says the roof of her mouth is sore. She is calling it a sore throat and tired, no other symptoms. We have no appointments available for today.

## 2019-11-08 NOTE — Telephone Encounter (Signed)
Pt scheduled for virtual visit 

## 2019-11-09 MED ORDER — FIRST-DUKES MOUTHWASH MT SUSP: OROMUCOSAL | 0 refills | Status: DC

## 2019-11-09 NOTE — Telephone Encounter (Signed)
rx sent in for Dukes mouthwash

## 2019-11-10 ENCOUNTER — Encounter: Payer: Self-pay | Admitting: Internal Medicine

## 2019-11-10 LAB — NOVEL CORONAVIRUS, NAA: SARS-CoV-2, NAA: NOT DETECTED

## 2019-11-10 NOTE — Telephone Encounter (Signed)
Patient called back about her covid results. She got the results through her my chart

## 2019-11-12 ENCOUNTER — Encounter: Payer: Self-pay | Admitting: Internal Medicine

## 2019-11-12 NOTE — Assessment & Plan Note (Signed)
Increased sinus pressure and congestion.  Symptoms feel c/w her sinus infections.  Will treat for sinusitis.  Saline nasal spray and steroid nasal spray as directed.  Robitussin as directed.  Doxycycline.  Probiotic as directed.  Follow closely.  Will test for covid.

## 2019-12-05 ENCOUNTER — Other Ambulatory Visit: Payer: Self-pay | Admitting: Internal Medicine

## 2019-12-05 ENCOUNTER — Other Ambulatory Visit (INDEPENDENT_AMBULATORY_CARE_PROVIDER_SITE_OTHER): Payer: Medicare Other

## 2019-12-05 ENCOUNTER — Other Ambulatory Visit: Payer: Self-pay

## 2019-12-05 DIAGNOSIS — E7800 Pure hypercholesterolemia, unspecified: Secondary | ICD-10-CM | POA: Diagnosis not present

## 2019-12-05 DIAGNOSIS — I10 Essential (primary) hypertension: Secondary | ICD-10-CM | POA: Diagnosis not present

## 2019-12-06 ENCOUNTER — Other Ambulatory Visit: Payer: Medicare Other

## 2019-12-06 LAB — BASIC METABOLIC PANEL
BUN: 14 mg/dL (ref 6–23)
CO2: 29 mEq/L (ref 19–32)
Calcium: 10.1 mg/dL (ref 8.4–10.5)
Chloride: 104 mEq/L (ref 96–112)
Creatinine, Ser: 0.7 mg/dL (ref 0.40–1.20)
GFR: 81.83 mL/min (ref >60.00)
Glucose, Bld: 81 mg/dL (ref 70–99)
Potassium: 3.9 mEq/L (ref 3.5–5.1)
Sodium: 139 mEq/L (ref 135–145)

## 2019-12-06 LAB — CBC WITH DIFFERENTIAL/PLATELET
Basophils Absolute: 0.1 10*3/uL (ref 0.0–0.1)
Basophils Relative: 1.4 % (ref 0.0–3.0)
Eosinophils Absolute: 0.2 10*3/uL (ref 0.0–0.7)
Eosinophils Relative: 2 % (ref 0.0–5.0)
HCT: 40.2 % (ref 36.0–46.0)
Hemoglobin: 13.6 g/dL (ref 12.0–15.0)
Lymphocytes Relative: 17.7 % (ref 12.0–46.0)
Lymphs Abs: 1.6 10*3/uL (ref 0.7–4.0)
MCHC: 33.7 g/dL (ref 30.0–36.0)
MCV: 93 fl (ref 78.0–100.0)
Monocytes Absolute: 0.4 10*3/uL (ref 0.1–1.0)
Monocytes Relative: 4.8 % (ref 3.0–12.0)
Neutro Abs: 6.5 10*3/uL (ref 1.4–7.7)
Neutrophils Relative %: 74.1 % (ref 43.0–77.0)
Platelets: 233 10*3/uL (ref 150.0–400.0)
RBC: 4.32 Mil/uL (ref 3.87–5.11)
RDW: 13.6 % (ref 11.5–15.5)
WBC: 8.8 10*3/uL (ref 4.0–10.5)

## 2019-12-06 LAB — HEPATIC FUNCTION PANEL
ALT: 18 U/L (ref 0–35)
AST: 19 U/L (ref 0–37)
Albumin: 4.2 g/dL (ref 3.5–5.2)
Alkaline Phosphatase: 88 U/L (ref 39–117)
Bilirubin, Direct: 0.1 mg/dL (ref 0.0–0.3)
Total Bilirubin: 0.5 mg/dL (ref 0.2–1.2)
Total Protein: 6.5 g/dL (ref 6.0–8.3)

## 2019-12-06 LAB — LIPID PANEL
Cholesterol: 187 mg/dL (ref 0–200)
HDL: 59.3 mg/dL (ref >39.00)
LDL Cholesterol: 104 mg/dL — ABNORMAL HIGH (ref 0–99)
NonHDL: 127.71
Total CHOL/HDL Ratio: 3
Triglycerides: 117 mg/dL (ref 0.0–149.0)
VLDL: 23.4 mg/dL (ref 0.0–40.0)

## 2019-12-06 LAB — TSH: TSH: 1.41 u[IU]/mL (ref 0.35–4.50)

## 2019-12-07 ENCOUNTER — Encounter: Payer: Self-pay | Admitting: Internal Medicine

## 2019-12-08 ENCOUNTER — Other Ambulatory Visit: Payer: Self-pay

## 2019-12-08 ENCOUNTER — Ambulatory Visit (INDEPENDENT_AMBULATORY_CARE_PROVIDER_SITE_OTHER): Payer: Medicare Other

## 2019-12-08 ENCOUNTER — Encounter: Payer: Self-pay | Admitting: Internal Medicine

## 2019-12-08 ENCOUNTER — Ambulatory Visit (INDEPENDENT_AMBULATORY_CARE_PROVIDER_SITE_OTHER): Payer: Medicare Other | Admitting: Internal Medicine

## 2019-12-08 VITALS — BP 147/69 | HR 76 | Ht 62.0 in | Wt 122.0 lb

## 2019-12-08 DIAGNOSIS — E7800 Pure hypercholesterolemia, unspecified: Secondary | ICD-10-CM | POA: Diagnosis not present

## 2019-12-08 DIAGNOSIS — Z87898 Personal history of other specified conditions: Secondary | ICD-10-CM

## 2019-12-08 DIAGNOSIS — I10 Essential (primary) hypertension: Secondary | ICD-10-CM

## 2019-12-08 DIAGNOSIS — Z9109 Other allergy status, other than to drugs and biological substances: Secondary | ICD-10-CM | POA: Diagnosis not present

## 2019-12-08 DIAGNOSIS — Z0000 Encounter for general adult medical examination without abnormal findings: Secondary | ICD-10-CM

## 2019-12-08 DIAGNOSIS — R945 Abnormal results of liver function studies: Secondary | ICD-10-CM | POA: Diagnosis not present

## 2019-12-08 DIAGNOSIS — R7989 Other specified abnormal findings of blood chemistry: Secondary | ICD-10-CM

## 2019-12-08 NOTE — Progress Notes (Signed)
Patient ID: Jamie Elliott, female   DOB: 17-Dec-1946, 73 y.o.   MRN: [id number]   Virtual Visit via video Note  This visit type was conducted due to national recommendations for restrictions regarding the COVID-19 pandemic (e.g. social distancing).  This format is felt to be most appropriate for this patient at this time.  All issues noted in this document were discussed and addressed.  No physical exam was performed (except for noted visual exam findings with Video Visits).   I connected with Jamie Elliott by a video enabled telemedicine application and verified that I am speaking with the correct person using two identifiers. Location patient: home Location provider: work Persons participating in the virtual visit: patient, provider  I discussed the limitations, risks, security and privacy concerns of performing an evaluation and management service by video and the availability of in person appointments. The patient expressed understanding and agreed to proceed.   Reason for visit: scheduled follow up.    HPI: She reports she is doing better. Feels better.  Sinus congestion improved.  Stays active.  Has been exercising.  Not as much now with weather change, but she is walking in her house. No chest pain or sob reported.  Eating.  No abdominal pain reported.  Does report minimal constipation, but has a bowel movement everyday.  Plans to adjust her diet.  Not a significant issue for her.  Discussed labs.  On lipitor.  Questions answered. She is scheduled for mammogram 01/11/20.  Blood pressure doing well.  On amlodipine.     ROS: See pertinent positives and negatives per HPI.  Past Medical History:  Diagnosis Date  . Environmental allergies   . Hypercholesterolemia   . Hypertension   . Migraines     Past Surgical History:  Procedure Laterality Date  . ABDOMINAL HYSTERECTOMY  2000  . APPENDECTOMY  2000  . NOSE SURGERY  1967  . SEPTOPLASTY    . SHOULDER ARTHROSCOPY WITH OPEN ROTATOR  CUFF REPAIR Right 12/05/2015   Procedure: SHOULDER ARTHROSCOPY WITH MINI OPEN ROTATOR CUFF REPAIR. DISTAL CLAVICLE EXCISION;  Surgeon: Thornton Park, MD;  Location: [facility] OR[facility];  Service: Orthopedics;  Laterality: Right;    Family History  Problem Relation Age of Onset  . Heart disease Mother   . Hyperlipidemia Mother   . Hypertension Mother   . Cancer Father        lung, prostate  . Hyperlipidemia Father   . Hypertension Father   . Prostate cancer Father   . Bladder Cancer Neg Hx   . Kidney cancer Neg Hx     SOCIAL HX: reviewed.    Current Outpatient Medications:  .  acetaminophen (TYLENOL) 500 MG tablet, Take 500 mg by mouth every 6 (six) hours as needed., Disp: , Rfl:  .  amLODipine (NORVASC) 5 MG tablet, Take 1 tablet (5 mg total) by mouth daily., Disp: 30 tablet, Rfl: 0 .  aspirin 325 MG EC tablet, Take 325 mg by mouth daily., Disp: , Rfl:  .  atorvastatin (LIPITOR) 20 MG tablet, Take 1 tablet (20 mg total) daily by mouth., Disp: 30 tablet, Rfl: 0 .  cetirizine (ZYRTEC) 10 MG tablet, Take 1 tablet (10 mg total) by mouth daily., Disp: 30 tablet, Rfl: 0 .  CRANBERRY EXTRACT PO, Take 1 capsule by mouth 2 (two) times daily., Disp: , Rfl:  .  Diphenhyd-Hydrocort-Nystatin (FIRST-DUKES MOUTHWASH) SUSP, 5 cc's swish and spit tid, Disp: 237 mL, Rfl: 0 .  hydrochlorothiazide (HYDRODIURIL) 12.5 MG tablet, TAKE  ONE TABLET DAILY., Disp: 90 tablet, Rfl: 1 .  losartan (COZAAR) 100 MG tablet, Take 1 tablet by mouth daily., Disp: 90 tablet, Rfl: 1 .  Probiotic Product (ALIGN PO), Take by mouth as needed., Disp: , Rfl:  .  triamcinolone (NASACORT) 55 MCG/ACT nasal inhaler, Place 2 sprays into the nose as needed., Disp: 1 Inhaler, Rfl: 5  EXAM:  VITALS per patient if applicable: blood pressure recheck:  121/64  GENERAL: alert, oriented, appears well and in no acute distress  HEENT: atraumatic, conjunttiva clear, no obvious abnormalities on inspection of external nose and ears  NECK: normal  movements of the head and neck  LUNGS: on inspection no signs of respiratory distress, breathing rate appears normal, no obvious gross SOB, gasping or wheezing  CV: no obvious cyanosis  PSYCH/NEURO: pleasant and cooperative, no obvious depression or anxiety, speech and thought processing grossly intact  ASSESSMENT AND PLAN:  Discussed the following assessment and plan:  Abnormal liver function tests Follow liver panel.   Environmental allergies Controlled.   History of abnormal mammogram Scheduled for f/u annual screening mammogram 01/11/20.   Hypercholesterolemia On lipitor.  Low cholesterol diet and exercise.  Follow lipid panel and liver function tests.    Hypertension Blood pressure under good control.  Continue same medication regimen.  Follow pressures.  Follow metabolic panel.      I discussed the assessment and treatment plan with the patient. The patient was provided an opportunity to ask questions and all were answered. The patient agreed with the plan and demonstrated an understanding of the instructions.   The patient was advised to call back or seek an in-person evaluation if the symptoms worsen or if the condition fails to improve as anticipated.   Einar Pheasant, MD

## 2019-12-08 NOTE — Progress Notes (Addendum)
Subjective:   Jamie Elliott is a 73 y.o. female who presents for Medicare Annual (Subsequent) preventive examination.  Review of Systems:  No ROS.  Medicare Wellness Virtual Visit.  Visual/audio telehealth visit, vital signs provided by patient.  See social history for additional risk factors.   Cardiac Risk Factors include: advanced age (>56men, >55 women);hypertension     Objective:     Vitals: BP (!) 147/69 (BP Location: Left Arm, Patient Position: Sitting, Cuff Size: Normal)   Pulse 76   Ht 5\' 2"  (1.575 m)   Wt 122 lb (55.3 kg)   LMP 11/28/1998   BMI 22.31 kg/m   Body mass index is 22.31 kg/m.  Advanced Directives 12/08/2019 10/04/2018 09/30/2017 09/30/2016 12/31/2015 12/05/2015 11/25/2015  Does Patient Have a Medical Advance Directive? Yes Yes Yes Yes Yes Yes Yes  Type of Paramedic of [place];Living will Living will;Healthcare Power of [place];Living will [place];Living will - [place];Living will [place];Living will  Does patient want to make changes to medical advance directive? No - Patient declined No - Patient declined No - Patient declined - - No - Patient declined -  Copy of [place] in Chart? Yes - validated most recent copy scanned in chart (See row information) No - copy requested No - copy requested No - copy requested No - copy requested No - copy requested -    Tobacco Social History   Tobacco Use  Smoking Status Never Smoker  Smokeless Tobacco Never Used     Counseling given: Not Answered   Clinical Intake:  Pre-visit preparation completed: Yes        Diabetes: No  How often do you need to have someone help you when you read instructions, pamphlets, or other written materials from your doctor or pharmacy?: 1 - Never  Interpreter Needed?: No     Past Medical History:  Diagnosis Date  . Environmental  allergies   . Hypercholesterolemia   . Hypertension   . Migraines    Past Surgical History:  Procedure Laterality Date  . ABDOMINAL HYSTERECTOMY  2000  . APPENDECTOMY  2000  . NOSE SURGERY  1967  . SEPTOPLASTY    . SHOULDER ARTHROSCOPY WITH OPEN ROTATOR CUFF REPAIR Right 12/05/2015   Procedure: SHOULDER ARTHROSCOPY WITH MINI OPEN ROTATOR CUFF REPAIR. DISTAL CLAVICLE EXCISION;  Surgeon: Thornton Park, MD;  Location: [facility] ORS;  Service: Orthopedics;  Laterality: Right;   Family History  Problem Relation Age of Onset  . Heart disease Mother   . Hyperlipidemia Mother   . Hypertension Mother   . Cancer Father        lung, prostate  . Hyperlipidemia Father   . Hypertension Father   . Prostate cancer Father   . Bladder Cancer Neg Hx   . Kidney cancer Neg Hx    Social History   Socioeconomic History  . Marital status: Married    Spouse name: Not on file  . Number of children: 2  . Years of education: Not on file  . Highest education level: Not on file  Occupational History  . Not on file  Tobacco Use  . Smoking status: Never Smoker  . Smokeless tobacco: Never Used  Substance and Sexual Activity  . Alcohol use: No    Alcohol/week: 0.0 standard drinks  . Drug use: No  . Sexual activity: Not Currently  Other Topics Concern  . Not on file  Social History Narrative  . Not on file   Social Determinants of Health   Financial Resource Strain:   . Difficulty of Paying Living Expenses: Not on file  Food Insecurity:   . Worried About Charity fundraiser in the Last Year: Not on file  . Ran Out of Food in the Last Year: Not on file  Transportation Needs:   . Lack of Transportation (Medical): Not on file  . Lack of Transportation (Non-Medical): Not on file  Physical Activity:   . Days of Exercise per Week: Not on file  . Minutes of Exercise per Session: Not on file  Stress:   . Feeling of Stress : Not on file  Social Connections:   . Frequency of Communication with  Friends and Family: Not on file  . Frequency of Social Gatherings with Friends and Family: Not on file  . Attends Religious Services: Not on file  . Active Member of Clubs or Organizations: Not on file  . Attends Archivist Meetings: Not on file  . Marital Status: Not on file    Outpatient Encounter Medications as of 12/08/2019  Medication Sig  . acetaminophen (TYLENOL) 500 MG tablet Take 500 mg by mouth every 6 (six) hours as needed.  Marland Kitchen amLODipine (NORVASC) 5 MG tablet Take 1 tablet (5 mg total) by mouth daily.  Marland Kitchen aspirin 325 MG EC tablet Take 325 mg by mouth daily.  Marland Kitchen atorvastatin (LIPITOR) 20 MG tablet Take 1 tablet (20 mg total) daily by mouth.  . cetirizine (ZYRTEC) 10 MG tablet Take 1 tablet (10 mg total) by mouth daily.  Marland Kitchen CRANBERRY EXTRACT PO Take 1 capsule by mouth 2 (two) times daily.  . Diphenhyd-Hydrocort-Nystatin (FIRST-DUKES MOUTHWASH) SUSP 5 cc's swish and spit tid  . hydrochlorothiazide (HYDRODIURIL) 12.5 MG tablet TAKE ONE TABLET DAILY.  Marland Kitchen losartan (COZAAR) 100 MG tablet Take 1 tablet by mouth daily.  . Probiotic Product (ALIGN PO) Take by mouth as needed.  . triamcinolone (NASACORT) 55 MCG/ACT nasal inhaler Place 2 sprays into the nose as needed.  . [DISCONTINUED] doxycycline (VIBRA-TABS) 100 MG tablet Take 1 tablet (100 mg total) by mouth 2 (two) times daily.   No facility-administered encounter medications on file as of 12/08/2019.    Activities of Daily Living In your present state of health, do you have any difficulty performing the following activities: 12/08/2019  Hearing? N  Vision? N  Difficulty concentrating or making decisions? N  Walking or climbing stairs? N  Dressing or bathing? N  Doing errands, shopping? N  Preparing Food and eating ? N  Using the Toilet? N  In the past six months, have you accidently leaked urine? N  Do you have problems with loss of bowel control? N  Managing your Medications? N  Managing your Finances? N   Housekeeping or managing your Housekeeping? N  Some recent data might be hidden    Patient Care Team: Einar Pheasant, MD as PCP - General (Internal Medicine)    Assessment:   This is a routine wellness examination for Jamie Elliott.  Nurse connected with patient 12/08/19 at  9:30 AM EST by a telephone enabled telemedicine application and verified that I am speaking with the correct person using two identifiers. Patient stated full name and DOB. Patient gave permission to continue with virtual visit. Patient's location was at home and Nurse's location was at [place] office.   Patient is alert and oriented x3. Patient denies difficulty focusing or concentrating. Patient likes to read  and talks on the phone for brain stimulation.   Health Maintenance Due: -Mammogram- scheduled 01/11/20 See completed HM at the end of note.   Eye: Visual acuity not assessed. Virtual visit. Followed by their ophthalmologist.  Dental: Visits every 6 months.    Hearing: Demonstrates normal hearing during visit.  Safety:  Patient feels safe at home- yes Patient does have smoke detectors at home- yes Patient does wear sunscreen or protective clothing when in direct sunlight - yes Patient does wear seat belt when in a moving vehicle - yes Patient drives- yes Adequate lighting in walkways free from debris- yes Grab bars and handrails used as appropriate- yes Ambulates with an assistive device- no Cell phone on person when ambulating outside of the home- yes  Social: Alcohol intake - no       Smoking history- never   Smokers in home? none Illicit drug use? none  Medication: Taking as directed and without issues.  Pill box in use -yes  Self managed - yes   Covid-19: Precautions and sickness symptoms discussed. Wears mask, social distancing, hand hygiene as appropriate.   Activities of Daily Living Patient denies needing assistance with: household chores, feeding themselves, getting from bed to  chair, getting to the toilet, bathing/showering, dressing, managing money, or preparing meals.   Discussed the importance of a healthy diet, water intake and the benefits of aerobic exercise.  Physical activity- walking  Diet:  Regular  Water: good intake  Caffeine: none  Other Providers Patient Care Team: Einar Pheasant, MD as PCP - General (Internal Medicine)  Exercise Activities and Dietary recommendations Current Exercise Habits: Home exercise routine, Type of exercise: walking, Intensity: Mild  Goals      Patient Stated   . I want to do more puzzles with my husband (pt-stated)     Brain engagement activities       Fall Risk Fall Risk  12/08/2019 09/12/2019 10/04/2018 06/10/2018 09/30/2017  Falls in the past year? 0 0 No No No  Number falls in past yr: - - - - -  Injury with Fall? - - - - -  Comment - - - - -  Follow up Falls prevention discussed Falls evaluation completed - - -   Timed Get Up and Go performed: no, virtual   Depression Screen PHQ 2/9 Scores 12/08/2019 10/04/2018 06/10/2018 09/30/2017  PHQ - 2 Score 0 0 0 0  PHQ- 9 Score - - - 0     Cognitive Function MMSE - Mini Mental State Exam 10/04/2018 09/30/2017 09/30/2016 09/30/2015  Orientation to time 5 5 5 5   Orientation to Place 5 5 5 5   Registration 3 3 3 3   Attention/ Calculation 5 5 5 5   Recall 3 3 3 3   Language- name 2 objects 2 2 2 2   Language- repeat 1 1 1 1   Language- follow 3 step command 3 3 3 3   Language- read & follow direction 1 1 1 1   Write a sentence 1 1 1 1   Copy design 1 1 1 1   Total score 30 30 30 30      6CIT Screen 12/08/2019  What Year? 0 points  What month? 0 points  What time? 0 points  Count back from 20 0 points  Months in reverse 0 points  Repeat phrase 2 points  Total Score 2    Immunization History  Administered Date(s) Administered  . Influenza Split 10/27/2012  . Influenza, High Dose Seasonal PF 09/30/2016, 10/04/2018  . Influenza,inj,Quad PF,6+  Mos  08/16/2014, 08/20/2015  . Influenza,inj,quad, With Preservative 10/20/2017, 10/17/2019  . Pneumococcal Conjugate-13 10/17/2019  . Pneumococcal Polysaccharide-23 06/05/2013  . Tdap 01/11/2013  . Zoster 06/04/2011   Screening Tests Health Maintenance  Topic Date Due  . MAMMOGRAM  09/02/2019  . COLONOSCOPY  12/10/2021  . TETANUS/TDAP  01/11/2023  . INFLUENZA VACCINE  Completed  . DEXA SCAN  Completed  . Hepatitis C Screening  Completed  . PNA vac Low Risk Adult  Completed      Plan:   Keep all routine maintenance appointments.   Follow up today @ 10:00  Medicare Attestation I have personally reviewed: The patient's medical and social history Their use of alcohol, tobacco or illicit drugs Their current medications and supplements The patient's functional ability including ADLs,fall risks, home safety risks, cognitive, and hearing and visual impairment Diet and physical activities Evidence for depression   I have reviewed and discussed with patient certain preventive protocols, quality metrics, and best practice recommendations.     Varney Biles, LPN  [id number]   Reviewed above information.  Recheck blood pressure 121/64.  Agree with assessment and plan.    Dr Nicki Reaper

## 2019-12-08 NOTE — Patient Instructions (Addendum)
  Ms. Senft , Thank you for taking time to come for your Medicare Wellness Visit. I appreciate your ongoing commitment to your health goals. Please review the following plan we discussed and let me know if I can assist you in the future.   These are the goals we discussed: Goals      Patient Stated   . I want to do more puzzles with my husband (pt-stated)     Brain engagement activities       This is a list of the screening recommended for you and due dates:  Health Maintenance  Topic Date Due  . Mammogram  09/02/2019  . Colon Cancer Screening  12/10/2021  . Tetanus Vaccine  01/11/2023  . Flu Shot  Completed  . DEXA scan (bone density measurement)  Completed  .  Hepatitis C: One time screening is recommended by Center for Disease Control  (CDC) for  adults born from [birth year] through 1965.   Completed  . Pneumonia vaccines  Completed

## 2019-12-09 ENCOUNTER — Encounter: Payer: Self-pay | Admitting: Internal Medicine

## 2019-12-09 NOTE — Assessment & Plan Note (Signed)
Blood pressure under good control.  Continue same medication regimen.  Follow pressures.  Follow metabolic panel.   

## 2019-12-09 NOTE — Assessment & Plan Note (Signed)
On lipitor.  Low cholesterol diet and exercise.  Follow lipid panel and liver function tests.   

## 2019-12-09 NOTE — Assessment & Plan Note (Signed)
Controlled.  

## 2019-12-09 NOTE — Assessment & Plan Note (Signed)
Scheduled for f/u annual screening mammogram 01/11/20.

## 2019-12-09 NOTE — Assessment & Plan Note (Signed)
Follow liver panel.  

## 2020-01-01 ENCOUNTER — Other Ambulatory Visit: Payer: Self-pay | Admitting: Internal Medicine

## 2020-01-11 DIAGNOSIS — Z1231 Encounter for screening mammogram for malignant neoplasm of breast: Secondary | ICD-10-CM | POA: Diagnosis not present

## 2020-03-05 ENCOUNTER — Ambulatory Visit (INDEPENDENT_AMBULATORY_CARE_PROVIDER_SITE_OTHER): Payer: Medicare Other | Admitting: Physician Assistant

## 2020-03-05 ENCOUNTER — Other Ambulatory Visit: Payer: Self-pay

## 2020-03-05 VITALS — BP 120/76 | HR 102 | Ht 62.0 in | Wt 125.0 lb

## 2020-03-05 DIAGNOSIS — R300 Dysuria: Secondary | ICD-10-CM

## 2020-03-05 DIAGNOSIS — R350 Frequency of micturition: Secondary | ICD-10-CM | POA: Diagnosis not present

## 2020-03-05 DIAGNOSIS — N390 Urinary tract infection, site not specified: Secondary | ICD-10-CM | POA: Diagnosis not present

## 2020-03-05 LAB — URINALYSIS, COMPLETE
Bilirubin, UA: NEGATIVE
Glucose, UA: NEGATIVE
Ketones, UA: NEGATIVE
Nitrite, UA: NEGATIVE
Protein,UA: NEGATIVE
RBC, UA: NEGATIVE
Specific Gravity, UA: <1.005 — ABNORMAL LOW (ref 1.005–1.030)
Urobilinogen, Ur: 0.2 mg/dL (ref 0.2–1.0)
pH, UA: 6 (ref 5.0–7.5)

## 2020-03-05 LAB — MICROSCOPIC EXAMINATION: RBC, Urine: NONE SEEN /hpf (ref 0–2)

## 2020-03-05 LAB — BLADDER SCAN AMB NON-IMAGING
Scan Result: 189
Scan Result: 49

## 2020-03-05 NOTE — Progress Notes (Signed)
03/05/2020 1:17 PM   Jamie Elliott 1946-07-23 [id number]  CC: Dysuria, frequency, lower abdominal pressure  HPI: Jamie Elliott is a 74 y.o. female with PMH recurrent UTI, chronic constipation, atrophic vaginitis who presents today for evaluation of possible UTI. She is an established [facility] patient who last saw Dr. Erlene Quan on 02/02/2018 for follow-up of recurrent UTI with cystoscopy with findings of atrophic vaginitis.  She was provided with Premarin samples at that time and counseled to use a pea-sized amount around the urethral meatus 3 times weekly.  Last positive urine culture 12/26/2018 with pansensitive E. coli.  Today, she reports an approximate 5-day history of terminal dysuria, low back pain, frequency, and lower abdominal pressure consistent with past UTIs.  She denies fever, chills, nausea, vomiting, and gross hematuria.  She has been taking cranberry supplements twice daily since we last saw her and she attributes these with her absence of urinary infections over the past year.  She states that she never used Premarin cream as recommended due to concerns for side effects of this medication.  In-office UA today positive for 1+ leukocyte esterase; urine microscopy with many bacteria. PVR originally 176mL; patient was prompted to void with subsequent PVR 49 mL.  PMH: Past Medical History:  Diagnosis Date  . Environmental allergies   . Hypercholesterolemia   . Hypertension   . Migraines     Surgical History: Past Surgical History:  Procedure Laterality Date  . ABDOMINAL HYSTERECTOMY  2000  . APPENDECTOMY  2000  . NOSE SURGERY  1967  . SEPTOPLASTY    . SHOULDER ARTHROSCOPY WITH OPEN ROTATOR CUFF REPAIR Right 12/05/2015   Procedure: SHOULDER ARTHROSCOPY WITH MINI OPEN ROTATOR CUFF REPAIR. DISTAL CLAVICLE EXCISION;  Surgeon: Thornton Park, MD;  Location: [facility] ORS;  Service: Orthopedics;  Laterality: Right;    Home Medications:  Allergies as of 03/05/2020      Reactions   Ciprofloxacin Other (See Comments)   Patient reports "it doesn't work for me". Last culture was sensitive, however.    Zithromax [azithromycin]    Augmentin [amoxicillin-pot Clavulanate] Rash      Medication List       Accurate as of March 05, 2020  1:17 PM. If you have any questions, ask your nurse or doctor.        acetaminophen 500 MG tablet Commonly known as: TYLENOL Take 500 mg by mouth every 6 (six) hours as needed.   ALIGN PO Take by mouth as needed.   amLODipine 5 MG tablet Commonly known as: NORVASC Take 1 tablet (5 mg total) by mouth daily.   aspirin 325 MG EC tablet Take 325 mg by mouth daily.   atorvastatin 20 MG tablet Commonly known as: LIPITOR Take 1 tablet (20 mg total) daily by mouth.   cetirizine 10 MG tablet Commonly known as: ZYRTEC Take 1 tablet (10 mg total) by mouth daily.   CRANBERRY EXTRACT PO Take 1 capsule by mouth 2 (two) times daily.   First-Dukes Mouthwash Susp 5 cc's swish and spit tid   hydrochlorothiazide 12.5 MG tablet Commonly known as: HYDRODIURIL TAKE ONE TABLET DAILY.   losartan 100 MG tablet Commonly known as: COZAAR Take 1 tablet by mouth daily.   triamcinolone 55 MCG/ACT nasal inhaler Commonly known as: NASACORT Place 2 sprays into the nose as needed.       Allergies:  Allergies  Allergen Reactions  . Ciprofloxacin Other (See Comments)    Patient reports "it doesn't work for me". Last culture was  sensitive, however.   . Zithromax [Azithromycin]   . Augmentin [Amoxicillin-Pot Clavulanate] Rash    Family History: Family History  Problem Relation Age of Onset  . Heart disease Mother   . Hyperlipidemia Mother   . Hypertension Mother   . Cancer Father        lung, prostate  . Hyperlipidemia Father   . Hypertension Father   . Prostate cancer Father   . Bladder Cancer Neg Hx   . Kidney cancer Neg Hx     Social History:   reports that she has never smoked. She has never used smokeless tobacco. She  reports that she does not drink alcohol or use drugs.  Physical Exam: BP 120/76   Pulse (!) 102   Ht 5\' 2"  (1.575 m)   Wt 125 lb (56.7 kg)   LMP 11/28/1998   BMI 22.86 kg/m   Constitutional:  Alert and oriented, no acute distress, nontoxic appearing HEENT: [place], AT Cardiovascular: No clubbing, cyanosis, or edema Respiratory: Normal respiratory effort, no increased work of breathing Skin: No rashes, bruises or suspicious lesions Neurologic: Grossly intact, no focal deficits, moving all 4 extremities Psychiatric: Normal mood and affect  Laboratory Data: Results for orders placed or performed in visit on 03/05/20  Microscopic Examination   URINE  Result Value Ref Range   WBC, UA 0-5 0 - 5 /hpf   RBC None seen 0 - 2 /hpf   Epithelial Cells (non renal) 0-10 0 - 10 /hpf   Bacteria, UA Many (A) None seen/Few  Urinalysis, Complete  Result Value Ref Range   Specific Gravity, UA <1.005 (L) 1.005 - 1.030   pH, UA 6.0 5.0 - 7.5   Color, UA Straw Yellow   Appearance Ur Clear Clear   Leukocytes,UA 1+ (A) Negative   Protein,UA Negative Negative/Trace   Glucose, UA Negative Negative   Ketones, UA Negative Negative   RBC, UA Negative Negative   Bilirubin, UA Negative Negative   Urobilinogen, Ur 0.2 0.2 - 1.0 mg/dL   Nitrite, UA Negative Negative   Microscopic Examination See below:   BLADDER SCAN AMB NON-IMAGING  Result Value Ref Range   Scan Result 189    Scan Result 49    Assessment & Plan:   1. Dysuria 74 year old female with a history of recurrent UTI on cranberry supplementation presents with her first possible urinary tract infection in over a year.  UA benign except for many bacteria, unclear if this is representative of infection versus contamination.  Will send for culture.  Given benign UA, will defer antibiotics today.  Counseled patient to take Azo as needed for symptom palliation in the interim.  I will call her with her results later this week. - Urinalysis, Complete -  CULTURE, URINE COMPREHENSIVE  2. Urinary frequency PVR initially elevated, however she was able to adequately empty upon prompted voiding.  - BLADDER SCAN AMB NON-IMAGING  3. Recurrent UTI Continue cranberry supplements twice daily.  Return if symptoms worsen or fail to improve.  Debroah Loop, PA-C  [facility] [address] [place], [place] [address] [phone]

## 2020-03-07 ENCOUNTER — Telehealth: Payer: Self-pay | Admitting: *Deleted

## 2020-03-07 DIAGNOSIS — N390 Urinary tract infection, site not specified: Secondary | ICD-10-CM

## 2020-03-07 MED ORDER — NITROFURANTOIN MONOHYD MACRO 100 MG PO CAPS: 100.0000 mg | ORAL_CAPSULE | Freq: Two times a day (BID) | ORAL | 0 refills | Status: AC

## 2020-03-07 NOTE — Telephone Encounter (Signed)
Please contact the patient and inform her that her urine culture is still in progress, however the preliminary reports indicate an infection.  I have sent Macrobid 100 mg twice daily x5 days to [facility].  I will follow up with her via telephone if I need to switch her to another antibiotic when we get the final results back.

## 2020-03-07 NOTE — Telephone Encounter (Signed)
Notified patient as instructed, patient pleased. Discussed follow-up appointments, patient agrees  

## 2020-03-07 NOTE — Telephone Encounter (Signed)
Patient called to find out about her ua . Preliminary report.

## 2020-03-08 LAB — CULTURE, URINE COMPREHENSIVE

## 2020-03-28 ENCOUNTER — Telehealth: Payer: Self-pay | Admitting: Internal Medicine

## 2020-03-28 MED ORDER — ATORVASTATIN CALCIUM 20 MG PO TABS: 20.0000 mg | ORAL_TABLET | Freq: Every day | ORAL | 0 refills | Status: DC

## 2020-03-28 NOTE — Telephone Encounter (Signed)
Pt states that she needs a refill of atorvastatin (LIPITOR) 20 MG tablet. Dr. Nehemiah Massed has released her but she states that she still needs to continue the medication. Please send to [facility] drug in [place].

## 2020-04-05 ENCOUNTER — Other Ambulatory Visit: Payer: Self-pay | Admitting: Internal Medicine

## 2020-04-16 ENCOUNTER — Other Ambulatory Visit: Payer: Self-pay

## 2020-04-16 ENCOUNTER — Other Ambulatory Visit (INDEPENDENT_AMBULATORY_CARE_PROVIDER_SITE_OTHER): Payer: Medicare Other

## 2020-04-16 DIAGNOSIS — R945 Abnormal results of liver function studies: Secondary | ICD-10-CM

## 2020-04-16 DIAGNOSIS — I10 Essential (primary) hypertension: Secondary | ICD-10-CM

## 2020-04-16 DIAGNOSIS — R7989 Other specified abnormal findings of blood chemistry: Secondary | ICD-10-CM

## 2020-04-16 DIAGNOSIS — E7800 Pure hypercholesterolemia, unspecified: Secondary | ICD-10-CM

## 2020-04-16 LAB — HEPATIC FUNCTION PANEL
ALT: 19 U/L (ref 0–35)
AST: 18 U/L (ref 0–37)
Albumin: 4.1 g/dL (ref 3.5–5.2)
Alkaline Phosphatase: 88 U/L (ref 39–117)
Bilirubin, Direct: 0.1 mg/dL (ref 0.0–0.3)
Total Bilirubin: 0.5 mg/dL (ref 0.2–1.2)
Total Protein: 6.6 g/dL (ref 6.0–8.3)

## 2020-04-16 LAB — LIPID PANEL
Cholesterol: 185 mg/dL (ref 0–200)
HDL: 59.1 mg/dL (ref >39.00)
LDL Cholesterol: 107 mg/dL — ABNORMAL HIGH (ref 0–99)
NonHDL: 125.93
Total CHOL/HDL Ratio: 3
Triglycerides: 93 mg/dL (ref 0.0–149.0)
VLDL: 18.6 mg/dL (ref 0.0–40.0)

## 2020-04-16 LAB — BASIC METABOLIC PANEL
BUN: 15 mg/dL (ref 6–23)
CO2: 31 mEq/L (ref 19–32)
Calcium: 9.7 mg/dL (ref 8.4–10.5)
Chloride: 103 mEq/L (ref 96–112)
Creatinine, Ser: 0.75 mg/dL (ref 0.40–1.20)
GFR: 75.49 mL/min (ref >60.00)
Glucose, Bld: 82 mg/dL (ref 70–99)
Potassium: 3.6 mEq/L (ref 3.5–5.1)
Sodium: 140 mEq/L (ref 135–145)

## 2020-04-18 ENCOUNTER — Other Ambulatory Visit: Payer: Self-pay

## 2020-04-18 ENCOUNTER — Encounter: Payer: Self-pay | Admitting: Internal Medicine

## 2020-04-18 ENCOUNTER — Ambulatory Visit (INDEPENDENT_AMBULATORY_CARE_PROVIDER_SITE_OTHER): Payer: Medicare Other | Admitting: Internal Medicine

## 2020-04-18 DIAGNOSIS — E7800 Pure hypercholesterolemia, unspecified: Secondary | ICD-10-CM | POA: Diagnosis not present

## 2020-04-18 DIAGNOSIS — R945 Abnormal results of liver function studies: Secondary | ICD-10-CM

## 2020-04-18 DIAGNOSIS — F439 Reaction to severe stress, unspecified: Secondary | ICD-10-CM

## 2020-04-18 DIAGNOSIS — K5900 Constipation, unspecified: Secondary | ICD-10-CM | POA: Diagnosis not present

## 2020-04-18 DIAGNOSIS — I10 Essential (primary) hypertension: Secondary | ICD-10-CM | POA: Diagnosis not present

## 2020-04-18 DIAGNOSIS — R7989 Other specified abnormal findings of blood chemistry: Secondary | ICD-10-CM

## 2020-04-18 MED ORDER — ATORVASTATIN CALCIUM 40 MG PO TABS: 40.0000 mg | ORAL_TABLET | Freq: Every day | ORAL | 2 refills | Status: DC

## 2020-04-18 NOTE — Progress Notes (Signed)
Patient ID: Jamie Elliott, female   DOB: 05-Feb-1946, 74 y.o.   MRN: [id number]   Subjective:    Patient ID: Jamie Elliott, female    DOB: 07/17/1946, 74 y.o.   MRN: [id number]  HPI This visit occurred during the SARS-CoV-2 public health emergency.  Safety protocols were in place, including screening questions prior to the visit, additional usage of staff PPE, and extensive cleaning of exam room while observing appropriate contact time as indicated for disinfecting solutions.  Patient here for a scheduled follow up. She reports she is doing relatively well.  Tries to stay active.  Walking inside.  Has not been getting out much.  No chest pain or sob reported.  No acid reflux or abdominal pain reported.  Minimal constipation.  Discussed taking miralax.  Increased stress.  Overall appears to be handling things relatively well.  Blood pressure doing well.     Past Medical History:  Diagnosis Date  . Environmental allergies   . Hypercholesterolemia   . Hypertension   . Migraines    Past Surgical History:  Procedure Laterality Date  . ABDOMINAL HYSTERECTOMY  2000  . APPENDECTOMY  2000  . NOSE SURGERY  1967  . SEPTOPLASTY    . SHOULDER ARTHROSCOPY WITH OPEN ROTATOR CUFF REPAIR Right 12/05/2015   Procedure: SHOULDER ARTHROSCOPY WITH MINI OPEN ROTATOR CUFF REPAIR. DISTAL CLAVICLE EXCISION;  Surgeon: Thornton Park, MD;  Location: [facility] ORS;  Service: Orthopedics;  Laterality: Right;   Family History  Problem Relation Age of Onset  . Heart disease Mother   . Hyperlipidemia Mother   . Hypertension Mother   . Cancer Father        lung, prostate  . Hyperlipidemia Father   . Hypertension Father   . Prostate cancer Father   . Bladder Cancer Neg Hx   . Kidney cancer Neg Hx    Social History   Socioeconomic History  . Marital status: Married    Spouse name: Not on file  . Number of children: 2  . Years of education: Not on file  . Highest education level: Not on file  Occupational History   . Not on file  Tobacco Use  . Smoking status: Never Smoker  . Smokeless tobacco: Never Used  Substance and Sexual Activity  . Alcohol use: No    Alcohol/week: 0.0 standard drinks  . Drug use: No  . Sexual activity: Not Currently  Other Topics Concern  . Not on file  Social History Narrative  . Not on file   Social Determinants of Health   Financial Resource Strain:   . Difficulty of Paying Living Expenses:   Food Insecurity:   . Worried About Charity fundraiser in the Last Year:   . Arboriculturist in the Last Year:   Transportation Needs:   . Film/video editor (Medical):   Marland Kitchen Lack of Transportation (Non-Medical):   Physical Activity:   . Days of Exercise per Week:   . Minutes of Exercise per Session:   Stress:   . Feeling of Stress :   Social Connections:   . Frequency of Communication with Friends and Family:   . Frequency of Social Gatherings with Friends and Family:   . Attends Religious Services:   . Active Member of Clubs or Organizations:   . Attends Archivist Meetings:   Marland Kitchen Marital Status:     Outpatient Encounter Medications as of 04/18/2020  Medication Sig  . acetaminophen (TYLENOL) 500 MG  tablet Take 500 mg by mouth every 6 (six) hours as needed.  Marland Kitchen amLODipine (NORVASC) 5 MG tablet Take 1 tablet (5 mg total) by mouth daily.  Marland Kitchen aspirin 325 MG EC tablet Take 325 mg by mouth daily.  . cetirizine (ZYRTEC) 10 MG tablet Take 1 tablet (10 mg total) by mouth daily.  Marland Kitchen CRANBERRY EXTRACT PO Take 1 capsule by mouth 2 (two) times daily.  . Diphenhyd-Hydrocort-Nystatin (FIRST-DUKES MOUTHWASH) SUSP 5 cc's swish and spit tid  . hydrochlorothiazide (HYDRODIURIL) 12.5 MG tablet TAKE ONE TABLET DAILY.  Marland Kitchen losartan (COZAAR) 100 MG tablet Take 1 tablet by mouth daily.  . Probiotic Product (ALIGN PO) Take by mouth as needed.  . triamcinolone (NASACORT) 55 MCG/ACT nasal inhaler Place 2 sprays into the nose as needed.  . [DISCONTINUED] atorvastatin (LIPITOR) 20 MG  tablet Take 1 tablet (20 mg total) by mouth daily.  Marland Kitchen atorvastatin (LIPITOR) 40 MG tablet Take 1 tablet (40 mg total) by mouth daily.   No facility-administered encounter medications on file as of 04/18/2020.    Review of Systems  Constitutional: Negative for appetite change and unexpected weight change.  HENT: Negative for congestion and sinus pressure.   Respiratory: Negative for cough, chest tightness and shortness of breath.   Cardiovascular: Negative for chest pain, palpitations and leg swelling.  Gastrointestinal: Negative for abdominal pain, diarrhea, nausea and vomiting.  Genitourinary: Negative for difficulty urinating.       No dysuria now.    Musculoskeletal: Negative for joint swelling and myalgias.  Skin: Negative for color change and rash.  Neurological: Negative for dizziness, light-headedness and headaches.  Psychiatric/Behavioral: Negative for agitation and dysphoric mood.       Objective:    Physical Exam Constitutional:      General: She is not in acute distress.    Appearance: Normal appearance.  HENT:     Head: Normocephalic and atraumatic.     Right Ear: External ear normal.     Left Ear: External ear normal.  Eyes:     General: No scleral icterus.       Right eye: No discharge.        Left eye: No discharge.     Conjunctiva/sclera: Conjunctivae normal.  Neck:     Thyroid: No thyromegaly.  Cardiovascular:     Rate and Rhythm: Normal rate and regular rhythm.  Pulmonary:     Effort: No respiratory distress.     Breath sounds: Normal breath sounds. No wheezing.  Abdominal:     General: Bowel sounds are normal.     Palpations: Abdomen is soft.     Tenderness: There is no abdominal tenderness.  Musculoskeletal:        General: No swelling or tenderness.     Cervical back: Neck supple. No tenderness.  Lymphadenopathy:     Cervical: No cervical adenopathy.  Skin:    Findings: No erythema or rash.  Neurological:     Mental Status: She is alert.   Psychiatric:        Mood and Affect: Mood normal.        Behavior: Behavior normal.     BP 130/60   Pulse 95   Temp (!) 97 F (36.1 C) (Temporal)   Ht 5\' 2"  (1.575 m)   Wt 123 lb 9.6 oz (56.1 kg)   LMP 11/28/1998   SpO2 97%   BMI 22.61 kg/m  Wt Readings from Last 3 Encounters:  04/18/20 123 lb 9.6 oz (56.1 kg)  03/05/20  125 lb (56.7 kg)  12/08/19 122 lb (55.3 kg)     Lab Results  Component Value Date   WBC 8.8 12/05/2019   HGB 13.6 12/05/2019   HCT 40.2 12/05/2019   PLT 233.0 12/05/2019   GLUCOSE 82 04/16/2020   CHOL 185 04/16/2020   TRIG 93.0 04/16/2020   HDL 59.10 04/16/2020   LDLDIRECT 103.0 05/21/2015   LDLCALC 107 (H) 04/16/2020   ALT 19 04/16/2020   AST 18 04/16/2020   NA 140 04/16/2020   K 3.6 04/16/2020   CL 103 04/16/2020   CREATININE 0.75 04/16/2020   BUN 15 04/16/2020   CO2 31 04/16/2020   TSH 1.41 12/05/2019   INR 1.00 11/25/2015    DG Abd 1 View  Result Date: 01/11/2018 CLINICAL DATA:  Low back pain and urinary tract infections for the past 2 months. No nausea, vomiting, or fever. EXAM: ABDOMEN - 1 VIEW COMPARISON:  None in PACs FINDINGS: The colonic stool burden is mildly increased. There no small or large bowel obstructive pattern. There are no abnormal soft tissue calcifications. There are pelvic phleboliths. There is gentle levocurvature centered in the mid lumbar spine. Mild degenerative disc disease is noted in the upper and lower lumbar spine. IMPRESSION: Mildly increased colonic stool burden may reflect constipation in the appropriate clinical setting. No acute intra-abdominal abnormality is observed. Mild degenerative disc change with scoliosis of the lumbar spine. Electronically Signed   By: David  Martinique M.D.   On: 01/11/2018 11:31       Assessment & Plan:   Problem List Items Addressed This Visit    Abnormal liver function tests    Follow liver function tests.        Constipation    Minimal constipation.  Discussed miralax.   Follow.        Hypercholesterolemia    On lipitor.  Follow lipid panel and liver function tests.        Relevant Medications   atorvastatin (LIPITOR) 40 MG tablet   Other Relevant Orders   Hepatic function panel   Lipid panel   Hypertension    Blood pressure as outlined.  Continue hctz, amlodipine and losartan.  Follow pressures.  Follow metabolic panel.        Relevant Medications   atorvastatin (LIPITOR) 40 MG tablet   Other Relevant Orders   Basic metabolic panel   Stress    Increased stress.  Overall handling things well.  Follow.            Einar Pheasant, MD

## 2020-04-22 ENCOUNTER — Encounter: Payer: Self-pay | Admitting: Internal Medicine

## 2020-04-22 DIAGNOSIS — F439 Reaction to severe stress, unspecified: Secondary | ICD-10-CM | POA: Insufficient documentation

## 2020-04-22 NOTE — Assessment & Plan Note (Signed)
Increased stress.  Overall handling things well.  Follow.

## 2020-04-22 NOTE — Assessment & Plan Note (Signed)
Blood pressure as outlined.  Continue hctz, amlodipine and losartan.  Follow pressures.  Follow metabolic panel.

## 2020-04-22 NOTE — Assessment & Plan Note (Signed)
On lipitor.  Follow lipid panel and liver function tests.   

## 2020-04-22 NOTE — Assessment & Plan Note (Signed)
Minimal constipation.  Discussed miralax.  Follow.

## 2020-04-22 NOTE — Assessment & Plan Note (Signed)
Follow liver function tests.   

## 2020-04-24 ENCOUNTER — Encounter: Payer: Self-pay | Admitting: Urology

## 2020-04-24 ENCOUNTER — Other Ambulatory Visit: Payer: Self-pay

## 2020-04-24 ENCOUNTER — Ambulatory Visit (INDEPENDENT_AMBULATORY_CARE_PROVIDER_SITE_OTHER): Payer: Medicare Other | Admitting: Urology

## 2020-04-24 VITALS — BP 122/77 | HR 96 | Ht 62.0 in | Wt 123.0 lb

## 2020-04-24 DIAGNOSIS — R300 Dysuria: Secondary | ICD-10-CM

## 2020-04-24 DIAGNOSIS — N952 Postmenopausal atrophic vaginitis: Secondary | ICD-10-CM | POA: Diagnosis not present

## 2020-04-24 DIAGNOSIS — N390 Urinary tract infection, site not specified: Secondary | ICD-10-CM

## 2020-04-24 LAB — BLADDER SCAN AMB NON-IMAGING: Scan Result: 27

## 2020-04-24 NOTE — Progress Notes (Signed)
04/24/2020 3:43 PM   Jamie Elliott 10-Mar-1946 [id number]  Referring provider: Einar Pheasant, [id number] [place],  [place] [address]  Chief Complaint  Patient presents with  . Recurrent UTI    HPI: Jamie Elliott is a 74 year old female with presumptive rUTI's, vaginal atrophy, chronic constipation who presents today for symptoms of pelvic pressure, frequency, urgency and dysuria.  She was seen initially with Dr. Erlene Quan for presumptive rUTI's on 01/11/2018.  At that visit, she was prescribed vaginal estrogen cream.  They also discussed general hygiene issues, probiotic use and cranberry tablets twice daily as preventative techniques.  A KUB was negative for nephrolithiasis, but it noted chronic constipation.  Cystoscopy performed on 02/02/2018 by Dr. Erlene Quan noted subtle trigonitis but no significant pathology.    She then presented to the office and was seen by Thomas Hoff, PA-C on 03/05/2020.  At that visit, she was having a 5-day history of terminal dysuria, low back pain, frequency and lower abdominal pressure.  Urine culture was positive for E. coli and she was treated with culture appropriate antibiotics.  Today, she states she has had a few day history of frequency and urgency with urination.  She developed lower back yesterday.  She started experiencing burning with urination this morning.  Patient denies any modifying or aggravating factors.  Patient denies any gross hematuria or suprapubic/flank pain.  Patient denies any fevers, chills, nausea or vomiting.   She is taking her cranberry tablets and drinking cranberry juice along with taking probiotics.  She is also taking MiraLAX to treat her chronic constipation.  She did not start the vaginal estrogen cream as she was concerned about the side effects.  She had taken oral Premarin after going through menopause and had a hysterectomy for growth.  She states that it was not cancerous, but she  cannot be more specific.  Her UA today is negative.   Her PVR is 27 mL.    PMH: Past Medical History:  Diagnosis Date  . Environmental allergies   . Hypercholesterolemia   . Hypertension   . Migraines     Surgical History: Past Surgical History:  Procedure Laterality Date  . ABDOMINAL HYSTERECTOMY  2000  . APPENDECTOMY  2000  . NOSE SURGERY  1967  . SEPTOPLASTY    . SHOULDER ARTHROSCOPY WITH OPEN ROTATOR CUFF REPAIR Right 12/05/2015   Procedure: SHOULDER ARTHROSCOPY WITH MINI OPEN ROTATOR CUFF REPAIR. DISTAL CLAVICLE EXCISION;  Surgeon: Thornton Park, MD;  Location: [facility] ORS;  Service: Orthopedics;  Laterality: Right;    Home Medications:  Current Outpatient Medications on File Prior to Visit  Medication Sig Dispense Refill  . acetaminophen (TYLENOL) 500 MG tablet Take 500 mg by mouth every 6 (six) hours as needed.    Marland Kitchen amLODipine (NORVASC) 5 MG tablet Take 1 tablet (5 mg total) by mouth daily. 30 tablet 11  . aspirin 325 MG EC tablet Take 325 mg by mouth daily.    Marland Kitchen atorvastatin (LIPITOR) 40 MG tablet Take 1 tablet (40 mg total) by mouth daily. 30 tablet 2  . cetirizine (ZYRTEC) 10 MG tablet Take 1 tablet (10 mg total) by mouth daily. 30 tablet 11  . CRANBERRY EXTRACT PO Take 1 capsule by mouth 2 (two) times daily.    . hydrochlorothiazide (HYDRODIURIL) 12.5 MG tablet TAKE ONE TABLET DAILY. 90 tablet 0  . losartan (COZAAR) 100 MG tablet Take 1 tablet by mouth daily. 90 tablet 0  . Probiotic Product (ALIGN  PO) Take by mouth as needed.    . triamcinolone (NASACORT) 55 MCG/ACT nasal inhaler Place 2 sprays into the nose as needed. 1 Inhaler 5   No current facility-administered medications on file prior to visit.    Allergies:  Allergies  Allergen Reactions  . Ciprofloxacin Other (See Comments)    Patient reports "it doesn't work for me". Last culture was sensitive, however.   . Zithromax [Azithromycin]   . Augmentin [Amoxicillin-Pot Clavulanate] Rash    Family  History: Family History  Problem Relation Age of Onset  . Heart disease Mother   . Hyperlipidemia Mother   . Hypertension Mother   . Cancer Father        lung, prostate  . Hyperlipidemia Father   . Hypertension Father   . Prostate cancer Father   . Bladder Cancer Neg Hx   . Kidney cancer Neg Hx     Social History:  reports that she has never smoked. She has never used smokeless tobacco. She reports that she does not drink alcohol or use drugs.  ROS: Pertinent ROS in HPI  Physical Exam: BP 122/77   Pulse 96   Ht 5\' 2"  (1.575 m)   Wt 123 lb (55.8 kg)   LMP 11/28/1998   BMI 22.50 kg/m   Constitutional:  Well nourished. Alert and oriented, No acute distress. HEENT: [place] AT, mask in place.  Trachea midline. Cardiovascular: No clubbing, cyanosis, or edema. Respiratory: Normal respiratory effort, no increased work of breathing. GI: Abdomen is soft, non tender, non distended, no abdominal masses.  GU: No CVA tenderness.  No bladder fullness or masses.  Atrophic external genitalia, sparse pubic hair distribution, no lesions.  Normal urethral meatus, no lesions, no prolapse, no discharge.   No urethral masses, tenderness and/or tenderness. No bladder fullness, tenderness or masses. Pale vagina mucosa, poor estrogen effect, no discharge, no lesions, fair pelvic support, grade I cystocele and no rectocele noted.  Cervix, uterus and adnexa are surgically absent.  Anus and perineum are without rashes or lesions.    Skin: No rashes, bruises or suspicious lesions. Lymph: No inguinal adenopathy. Neurologic: Grossly intact, no focal deficits, moving all 4 extremities. Psychiatric: Normal mood and affect.   Laboratory Data: Lab Results  Component Value Date   WBC 8.8 12/05/2019   HGB 13.6 12/05/2019   HCT 40.2 12/05/2019   MCV 93.0 12/05/2019   PLT 233.0 12/05/2019    Lab Results  Component Value Date   CREATININE 0.75 04/16/2020    Lab Results  Component Value Date   TSH 1.41  12/05/2019       Component Value Date/Time   CHOL 185 04/16/2020 0825   HDL 59.10 04/16/2020 0825   CHOLHDL 3 04/16/2020 0825   VLDL 18.6 04/16/2020 0825   LDLCALC 107 (H) 04/16/2020 0825    Lab Results  Component Value Date   AST 18 04/16/2020   Lab Results  Component Value Date   ALT 19 04/16/2020    Urinalysis Component     Latest Ref Rng & Units 04/24/2020          Specific Gravity, UA     1.005 - 1.030 1.015  pH, UA     5.0 - 7.5 6.0  Color, UA     Yellow Yellow  Appearance Ur     Clear Clear  Leukocytes,UA     Negative Negative  Protein,UA     Negative/Trace Negative  Glucose, UA     Negative Negative  Ketones,  UA     Negative Negative  RBC, UA     Negative Trace (A)  Bilirubin, UA     Negative Negative  Urobilinogen, Ur     0.2 - 1.0 mg/dL 0.2  Nitrite, UA     Negative Negative  Microscopic Examination      See below:   Component     Latest Ref Rng & Units 04/24/2020  WBC, UA     0 - 5 /hpf 0-5  RBC     0 - 2 /hpf None seen  Epithelial Cells (non renal)     0 - 10 /hpf 0-10  Bacteria, UA     None seen/Few None seen   I have reviewed the labs.   Pertinent Imaging: Results for CALLEE, DRUSCHEL (MRN [id number]) as of 04/24/2020 14:26  Ref. Range 04/24/2020 13:32  Scan Result Unknown 27   Assessment & Plan:    1. Recurrent dysuria - Urinalysis, Complete - CULTURE, URINE COMPREHENSIVE - Bladder Scan (Post Void Residual) in office -Encouraged patient to start her vaginal estrogen cream 3 nights weekly  2. Vaginal atrophy I explained to the patient that when women go through menopause and her estrogen levels are severely diminished, the normal vaginal flora will change.  This is due to an increase of the vaginal canal's pH. Because of this, the vaginal canal may be colonized by bacteria from the rectum instead of the protective lactobacillus.  This, accompanied by the loss of the mucus barrier with vaginal atrophy, is a cause of recurrent urinary  tract infections.  In some studies, the use of vaginal estrogen cream has been demonstrated to reduce  recurrent urinary tract infections to one a year.  Instructed to apply 0.5mg  (pea-sized amount)  just inside the vaginal introitus with a finger-tip on Monday, Wednesday and Friday nights.  I explained to the patient that vaginally administered estrogen, which causes only a slight increase in the blood estrogen levels, have fewer contraindications and adverse systemic effects that oral HT She still has the vaginal estrogen cream that was prescribed to her by Dr. Erlene Quan, so she is not in need of a prescription at this time   Return for Pending urine culture resutls .  These notes generated with voice recognition software. I apologize for typographical errors.  Zara Council, PA-C  [facility] [address]  [place], [place] [address] [phone]

## 2020-04-25 LAB — URINALYSIS, COMPLETE
Bilirubin, UA: NEGATIVE
Glucose, UA: NEGATIVE
Ketones, UA: NEGATIVE
Leukocytes,UA: NEGATIVE
Nitrite, UA: NEGATIVE
Protein,UA: NEGATIVE
Specific Gravity, UA: 1.015 (ref 1.005–1.030)
Urobilinogen, Ur: 0.2 mg/dL (ref 0.2–1.0)
pH, UA: 6 (ref 5.0–7.5)

## 2020-04-25 LAB — MICROSCOPIC EXAMINATION
Bacteria, UA: NONE SEEN
RBC, Urine: NONE SEEN /hpf (ref 0–2)

## 2020-04-26 LAB — CULTURE, URINE COMPREHENSIVE

## 2020-04-29 ENCOUNTER — Encounter: Payer: Self-pay | Admitting: Family Medicine

## 2020-04-29 ENCOUNTER — Other Ambulatory Visit: Payer: Self-pay

## 2020-04-29 ENCOUNTER — Ambulatory Visit (INDEPENDENT_AMBULATORY_CARE_PROVIDER_SITE_OTHER): Payer: Medicare Other | Admitting: Family Medicine

## 2020-04-29 VITALS — BP 135/80 | HR 79 | Ht 62.0 in | Wt 123.0 lb

## 2020-04-29 DIAGNOSIS — R300 Dysuria: Secondary | ICD-10-CM

## 2020-04-29 LAB — URINALYSIS, COMPLETE
Bilirubin, UA: NEGATIVE
Glucose, UA: NEGATIVE
Ketones, UA: NEGATIVE
Nitrite, UA: NEGATIVE
Protein,UA: NEGATIVE
Specific Gravity, UA: 1.01 (ref 1.005–1.030)
Urobilinogen, Ur: 0.2 mg/dL (ref 0.2–1.0)
pH, UA: 7 (ref 5.0–7.5)

## 2020-04-29 LAB — BLADDER SCAN AMB NON-IMAGING: Scan Result: 90

## 2020-04-29 LAB — MICROSCOPIC EXAMINATION
Bacteria, UA: NONE SEEN
WBC, UA: >30 /hpf — AB (ref 0–5)

## 2020-04-29 MED ORDER — NITROFURANTOIN MONOHYD MACRO 100 MG PO CAPS
100.0000 mg | ORAL_CAPSULE | Freq: Two times a day (BID) | ORAL | 0 refills | Status: DC
Start: 2020-04-29 — End: 2020-10-28

## 2020-04-29 NOTE — Telephone Encounter (Signed)
Spoke to patient and she is coming to office today for a UA and PVR.

## 2020-04-29 NOTE — Progress Notes (Signed)
Patient presents today with complaints of dysuria and urinary urgency. Patient also complains of difficulty urinating. A urine was collected for urinalysis and culture. A bladder scan was performed with a residual of 52ml. Jamie Elliott looked at UA  and based on the UA in office today Jamie Elliott was sent to pharmacy pending urine culture. We will call with culture results and change ABX if needed.

## 2020-04-29 NOTE — Telephone Encounter (Signed)
Patient called the office this morning.  She is still experiencing burning during urination, low back pain, and difficulty urinating.   She is requesting another appointment.  Please advise.

## 2020-05-01 ENCOUNTER — Other Ambulatory Visit: Payer: Self-pay | Admitting: Internal Medicine

## 2020-05-03 LAB — CULTURE, URINE COMPREHENSIVE

## 2020-05-16 DIAGNOSIS — D2261 Melanocytic nevi of right upper limb, including shoulder: Secondary | ICD-10-CM | POA: Diagnosis not present

## 2020-05-16 DIAGNOSIS — D225 Melanocytic nevi of trunk: Secondary | ICD-10-CM | POA: Diagnosis not present

## 2020-05-16 DIAGNOSIS — D2271 Melanocytic nevi of right lower limb, including hip: Secondary | ICD-10-CM | POA: Diagnosis not present

## 2020-05-16 DIAGNOSIS — D2262 Melanocytic nevi of left upper limb, including shoulder: Secondary | ICD-10-CM | POA: Diagnosis not present

## 2020-05-30 ENCOUNTER — Telehealth: Payer: Self-pay | Admitting: Internal Medicine

## 2020-05-30 DIAGNOSIS — Z1211 Encounter for screening for malignant neoplasm of colon: Secondary | ICD-10-CM

## 2020-05-30 NOTE — Telephone Encounter (Signed)
Pt would like to be referred to Dr Alice Reichert for her colonoscopy

## 2020-05-30 NOTE — Telephone Encounter (Signed)
Pt called needs a referral for colonoscopy and she would like to have  Dr.Keith Toliedo

## 2020-05-31 NOTE — Telephone Encounter (Signed)
Order placed for GI referral to Dr Alice Reichert.

## 2020-07-01 ENCOUNTER — Other Ambulatory Visit: Payer: Self-pay | Admitting: Internal Medicine

## 2020-07-02 ENCOUNTER — Other Ambulatory Visit: Payer: Self-pay | Admitting: Internal Medicine

## 2020-07-22 ENCOUNTER — Other Ambulatory Visit: Payer: Self-pay | Admitting: Internal Medicine

## 2020-07-31 DIAGNOSIS — Z01812 Encounter for preprocedural laboratory examination: Secondary | ICD-10-CM | POA: Diagnosis not present

## 2020-07-31 DIAGNOSIS — K5900 Constipation, unspecified: Secondary | ICD-10-CM | POA: Diagnosis not present

## 2020-07-31 DIAGNOSIS — Z86010 Personal history of colonic polyps: Secondary | ICD-10-CM | POA: Diagnosis not present

## 2020-08-05 ENCOUNTER — Other Ambulatory Visit (INDEPENDENT_AMBULATORY_CARE_PROVIDER_SITE_OTHER): Payer: Medicare Other

## 2020-08-05 ENCOUNTER — Other Ambulatory Visit: Payer: Self-pay

## 2020-08-05 DIAGNOSIS — I10 Essential (primary) hypertension: Secondary | ICD-10-CM

## 2020-08-05 DIAGNOSIS — E7800 Pure hypercholesterolemia, unspecified: Secondary | ICD-10-CM | POA: Diagnosis not present

## 2020-08-05 LAB — BASIC METABOLIC PANEL
BUN: 13 mg/dL (ref 6–23)
CO2: 30 mEq/L (ref 19–32)
Calcium: 10 mg/dL (ref 8.4–10.5)
Chloride: 105 mEq/L (ref 96–112)
Creatinine, Ser: 0.87 mg/dL (ref 0.40–1.20)
GFR: 63.56 mL/min (ref >60.00)
Glucose, Bld: 89 mg/dL (ref 70–99)
Potassium: 4 mEq/L (ref 3.5–5.1)
Sodium: 141 mEq/L (ref 135–145)

## 2020-08-05 LAB — HEPATIC FUNCTION PANEL
ALT: 16 U/L (ref 0–35)
AST: 18 U/L (ref 0–37)
Albumin: 4 g/dL (ref 3.5–5.2)
Alkaline Phosphatase: 79 U/L (ref 39–117)
Bilirubin, Direct: 0.1 mg/dL (ref 0.0–0.3)
Total Bilirubin: 0.5 mg/dL (ref 0.2–1.2)
Total Protein: 6.5 g/dL (ref 6.0–8.3)

## 2020-08-05 LAB — LIPID PANEL
Cholesterol: 164 mg/dL (ref 0–200)
HDL: 54.7 mg/dL (ref >39.00)
LDL Cholesterol: 82 mg/dL (ref 0–99)
NonHDL: 109.61
Total CHOL/HDL Ratio: 3
Triglycerides: 136 mg/dL (ref 0.0–149.0)
VLDL: 27.2 mg/dL (ref 0.0–40.0)

## 2020-08-06 ENCOUNTER — Ambulatory Visit: Payer: Medicare Other | Admitting: Internal Medicine

## 2020-08-06 ENCOUNTER — Telehealth (INDEPENDENT_AMBULATORY_CARE_PROVIDER_SITE_OTHER): Payer: Medicare Other | Admitting: Internal Medicine

## 2020-08-06 DIAGNOSIS — K5900 Constipation, unspecified: Secondary | ICD-10-CM

## 2020-08-06 DIAGNOSIS — E7800 Pure hypercholesterolemia, unspecified: Secondary | ICD-10-CM | POA: Diagnosis not present

## 2020-08-06 DIAGNOSIS — I10 Essential (primary) hypertension: Secondary | ICD-10-CM | POA: Diagnosis not present

## 2020-08-06 DIAGNOSIS — R7989 Other specified abnormal findings of blood chemistry: Secondary | ICD-10-CM

## 2020-08-06 DIAGNOSIS — R945 Abnormal results of liver function studies: Secondary | ICD-10-CM

## 2020-08-06 DIAGNOSIS — F439 Reaction to severe stress, unspecified: Secondary | ICD-10-CM | POA: Diagnosis not present

## 2020-08-06 DIAGNOSIS — J3489 Other specified disorders of nose and nasal sinuses: Secondary | ICD-10-CM

## 2020-08-06 NOTE — Progress Notes (Signed)
Patient ID: Jamie Elliott, female   DOB: 12-28-1945, 74 y.o.   MRN: [id number]   Virtual Visit via video Note  This visit type was conducted due to national recommendations for restrictions regarding the COVID-19 pandemic (e.g. social distancing).  This format is felt to be most appropriate for this patient at this time.  All issues noted in this document were discussed and addressed.  No physical exam was performed (except for noted visual exam findings with Video Visits).   I connected with [name] by a video enabled telemedicine application and verified that I am speaking with the correct person using two identifiers. Location patient: home Location provider: work  Persons participating in the virtual visit: patient, provider  The limitations, risks, security and privacy concerns of performing an evaluation and management service by video and the availability of in person appointments have been discussed.  It has also been discussed with the patient that there may be a patient responsible charge related to this service. The patient expressed understanding and agreed to proceed.   Reason for visit: follow up appt  HPI: She had been doing relatively well.  Increased stress - with covid, not seeing her grandchildren, etc.  Discussed with her today.  Overall she feels she is handling things relatively well.  Not walking now.  No chest pain or sob reported.  No increased cough or chest congestion.  Some increased drainage with increased pressure - ear pressure.  Some increased fatigue. No abdominal pain or nausea/vomiting reported.  Some constipation.  Has had covid vaccines. Blood pressure doing well.     ROS: See pertinent positives and negatives per HPI.  Past Medical History:  Diagnosis Date  . Environmental allergies   . Hypercholesterolemia   . Hypertension   . Migraines     Past Surgical History:  Procedure Laterality Date  . ABDOMINAL HYSTERECTOMY  2000  . APPENDECTOMY   2000  . NOSE SURGERY  1967  . SEPTOPLASTY    . SHOULDER ARTHROSCOPY WITH OPEN ROTATOR CUFF REPAIR Right 12/05/2015   Procedure: SHOULDER ARTHROSCOPY WITH MINI OPEN ROTATOR CUFF REPAIR. DISTAL CLAVICLE EXCISION;  Surgeon: Thornton Park, MD;  Location: [facility] ORS;  Service: Orthopedics;  Laterality: Right;    Family History  Problem Relation Age of Onset  . Heart disease Mother   . Hyperlipidemia Mother   . Hypertension Mother   . Cancer Father        lung, prostate  . Hyperlipidemia Father   . Hypertension Father   . Prostate cancer Father   . Bladder Cancer Neg Hx   . Kidney cancer Neg Hx     SOCIAL HX: reviewed.    Current Outpatient Medications:  .  acetaminophen (TYLENOL) 500 MG tablet, Take 500 mg by mouth every 6 (six) hours as needed., Disp: , Rfl:  .  amLODipine (NORVASC) 5 MG tablet, Take 1 tablet (5 mg total) by mouth daily., Disp: 30 tablet, Rfl: 11 .  aspirin 325 MG EC tablet, Take 325 mg by mouth daily., Disp: , Rfl:  .  atorvastatin (LIPITOR) 40 MG tablet, Take 1 tablet (40 mg total) by mouth daily., Disp: 30 tablet, Rfl: 0 .  cetirizine (ZYRTEC) 10 MG tablet, Take 1 tablet (10 mg total) by mouth daily., Disp: 30 tablet, Rfl: 11 .  CRANBERRY EXTRACT PO, Take 1 capsule by mouth 2 (two) times daily., Disp: , Rfl:  .  hydrochlorothiazide (HYDRODIURIL) 12.5 MG tablet, TAKE ONE TABLET DAILY., Disp: 90 tablet, Rfl: 0 .  losartan (COZAAR) 100 MG tablet, Take 1 tablet by mouth daily., Disp: 90 tablet, Rfl: 0 .  nitrofurantoin, macrocrystal-monohydrate, (MACROBID) 100 MG capsule, Take 1 capsule (100 mg total) by mouth every 12 (twelve) hours., Disp: 10 capsule, Rfl: 0 .  Probiotic Product (ALIGN PO), Take by mouth as needed., Disp: , Rfl:  .  triamcinolone (NASACORT) 55 MCG/ACT nasal inhaler, Place 2 sprays into the nose as needed., Disp: 1 Inhaler, Rfl: 5  EXAM:  VITALS per patient if applicable: 468/03  GENERAL: alert, oriented, appears well and in no acute  distress  HEENT: atraumatic, conjunttiva clear, no obvious abnormalities on inspection of external nose and ears  NECK: normal movements of the head and neck  LUNGS: on inspection no signs of respiratory distress, breathing rate appears normal, no obvious gross SOB, gasping or wheezing  CV: no obvious cyanosis  PSYCH/NEURO: pleasant and cooperative, no obvious depression or anxiety, speech and thought processing grossly intact  ASSESSMENT AND PLAN:  Discussed the following assessment and plan:  Stress Increased stress as outlined.  Discussed with her today.  Overall she feels she is handling things relatively well.  Follow.    Hypertension Blood pressure as outlined.  Continue amlodipine and hctz.  Follow pressures.  Follow metabolic panel.   Hypercholesterolemia On lipitor.  Low cholesterol diet and exercise.  Follow lipid panel and liver function tests.    Constipation Miralax.  Follow.    Abnormal liver function tests Follow liver function tests.    Nasal drainage Saline nasal spray and nasacort as directed.  Discussed afrin for three days.  Follow.  Notify me if symptoms progress.      I discussed the assessment and treatment plan with the patient. The patient was provided an opportunity to ask questions and all were answered. The patient agreed with the plan and demonstrated an understanding of the instructions.   The patient was advised to call back or seek an in-person evaluation if the symptoms worsen or if the condition fails to improve as anticipated.   Einar Pheasant, MD

## 2020-08-12 ENCOUNTER — Encounter: Payer: Self-pay | Admitting: Internal Medicine

## 2020-08-12 DIAGNOSIS — J3489 Other specified disorders of nose and nasal sinuses: Secondary | ICD-10-CM | POA: Insufficient documentation

## 2020-08-12 NOTE — Assessment & Plan Note (Signed)
Blood pressure as outlined.  Continue amlodipine and hctz.  Follow pressures.  Follow metabolic panel.

## 2020-08-12 NOTE — Assessment & Plan Note (Signed)
Follow liver function tests.   

## 2020-08-12 NOTE — Assessment & Plan Note (Signed)
On lipitor.  Low cholesterol diet and exercise.  Follow lipid panel and liver function tests.   

## 2020-08-12 NOTE — Assessment & Plan Note (Signed)
Miralax.  Follow.  

## 2020-08-12 NOTE — Assessment & Plan Note (Signed)
Increased stress as outlined.  Discussed with her today.  Overall she feels she is handling things relatively well.  Follow.

## 2020-08-12 NOTE — Assessment & Plan Note (Signed)
Saline nasal spray and nasacort as directed.  Discussed afrin for three days.  Follow.  Notify me if symptoms progress.

## 2020-08-21 DIAGNOSIS — Z01812 Encounter for preprocedural laboratory examination: Secondary | ICD-10-CM | POA: Diagnosis not present

## 2020-08-23 DIAGNOSIS — K5730 Diverticulosis of large intestine without perforation or abscess without bleeding: Secondary | ICD-10-CM | POA: Diagnosis not present

## 2020-08-23 DIAGNOSIS — Z86010 Personal history of colonic polyps: Secondary | ICD-10-CM | POA: Diagnosis not present

## 2020-08-23 DIAGNOSIS — K529 Noninfective gastroenteritis and colitis, unspecified: Secondary | ICD-10-CM | POA: Diagnosis not present

## 2020-08-23 DIAGNOSIS — K5289 Other specified noninfective gastroenteritis and colitis: Secondary | ICD-10-CM | POA: Diagnosis not present

## 2020-08-23 DIAGNOSIS — K640 First degree hemorrhoids: Secondary | ICD-10-CM | POA: Diagnosis not present

## 2020-08-23 DIAGNOSIS — K635 Polyp of colon: Secondary | ICD-10-CM | POA: Diagnosis not present

## 2020-08-23 LAB — HM COLONOSCOPY

## 2020-08-27 DIAGNOSIS — H40003 Preglaucoma, unspecified, bilateral: Secondary | ICD-10-CM | POA: Diagnosis not present

## 2020-09-26 ENCOUNTER — Other Ambulatory Visit: Payer: Self-pay | Admitting: Internal Medicine

## 2020-09-30 ENCOUNTER — Other Ambulatory Visit: Payer: Self-pay | Admitting: Internal Medicine

## 2020-10-27 NOTE — Progress Notes (Deleted)
10/28/2020 4:35 PM   Jamie Elliott [birth year] [id number]  Referring provider: Einar Pheasant, [address] [place],  [place] [address]  No chief complaint on file.   HPI: [name] is a 74 year old female with presumptive rUTI's, vaginal atrophy, chronic constipation who presents today for symptoms of frequency, dysuria and fatigue.    rUTI's Risk factors: age, vaginal atrophy, constipation, *** KUB 12/2017 constipation.  Cystoscopy performed on 02/02/2018 by Dr. Erlene Quan noted subtle trigonitis but no significant pathology.   + E.coli pan sensitive nitrofurantoin 100 mg x 5 days 03/05/2020 + E.coli resistant ampicillin, tetracycline nitrofurantoin 100 mg x 5 day 04/29/2020  Vaginal atrophy ***  Constipation ***   PMH: Past Medical History:  Diagnosis Date  . Environmental allergies   . Hypercholesterolemia   . Hypertension   . Migraines     Surgical History: Past Surgical History:  Procedure Laterality Date  . ABDOMINAL HYSTERECTOMY  2000  . APPENDECTOMY  2000  . NOSE SURGERY  1967  . SEPTOPLASTY    . SHOULDER ARTHROSCOPY WITH OPEN ROTATOR CUFF REPAIR Right 12/05/2015   Procedure: SHOULDER ARTHROSCOPY WITH MINI OPEN ROTATOR CUFF REPAIR. DISTAL CLAVICLE EXCISION;  Surgeon: Thornton Park, MD;  Location: [facility] ORS;  Service: Orthopedics;  Laterality: Right;    Home Medications:  Current Outpatient Medications on File Prior to Visit  Medication Sig Dispense Refill  . acetaminophen (TYLENOL) 500 MG tablet Take 500 mg by mouth every 6 (six) hours as needed.    Marland Kitchen amLODipine (NORVASC) 5 MG tablet Take 1 tablet (5 mg total) by mouth daily. 30 tablet 11  . aspirin 325 MG EC tablet Take 325 mg by mouth daily.    Marland Kitchen atorvastatin (LIPITOR) 40 MG tablet Take 1 tablet (40 mg total) by mouth daily. 30 tablet 0  . cetirizine (ZYRTEC) 10 MG tablet Take 1 tablet (10 mg total) by mouth daily. 30 tablet 11  . CRANBERRY EXTRACT PO Take 1 capsule by  mouth 2 (two) times daily.    . hydrochlorothiazide (HYDRODIURIL) 12.5 MG tablet TAKE ONE TABLET DAILY. 90 tablet 0  . losartan (COZAAR) 100 MG tablet Take 1 tablet by mouth daily. 90 tablet 0  . nitrofurantoin, macrocrystal-monohydrate, (MACROBID) 100 MG capsule Take 1 capsule (100 mg total) by mouth every 12 (twelve) hours. 10 capsule 0  . Probiotic Product (ALIGN PO) Take by mouth as needed.    . triamcinolone (NASACORT) 55 MCG/ACT nasal inhaler Place 2 sprays into the nose as needed. 1 Inhaler 5   No current facility-administered medications on file prior to visit.    Allergies:  Allergies  Allergen Reactions  . Ciprofloxacin Other (See Comments)    Patient reports "it doesn't work for me". Last culture was sensitive, however.   . Zithromax [Azithromycin]   . Augmentin [Amoxicillin-Pot Clavulanate] Rash    Family History: Family History  Problem Relation Age of Onset  . Heart disease Mother   . Hyperlipidemia Mother   . Hypertension Mother   . Cancer Father        lung, prostate  . Hyperlipidemia Father   . Hypertension Father   . Prostate cancer Father   . Bladder Cancer Neg Hx   . Kidney cancer Neg Hx     Social History:  reports that she has never smoked. She has never used smokeless tobacco. She reports that she does not drink alcohol and does not use drugs.  ROS: Pertinent ROS in HPI  Physical Exam:  LMP 11/28/1998   Constitutional:  Well nourished. Alert and oriented, No acute distress. HEENT: [place] AT, moist mucus membranes.  Trachea midline, no masses. Cardiovascular: No clubbing, cyanosis, or edema. Respiratory: Normal respiratory effort, no increased work of breathing. GI: Abdomen is soft, non tender, non distended, no abdominal masses. Liver and spleen not palpable.  No hernias appreciated.  Stool sample for occult testing is not indicated.   GU: No CVA tenderness.  No bladder fullness or masses.  *** external genitalia, *** pubic hair distribution, no  lesions.  Normal urethral meatus, no lesions, no prolapse, no discharge.   No urethral masses, tenderness and/or tenderness. No bladder fullness, tenderness or masses. *** vagina mucosa, *** estrogen effect, no discharge, no lesions, *** pelvic support, *** cystocele and *** rectocele noted.  No cervical motion tenderness.  Uterus is freely mobile and non-fixed.  No adnexal/parametria masses or tenderness noted.  Anus and perineum are without rashes or lesions.   ***  Skin: No rashes, bruises or suspicious lesions. Lymph: No cervical or inguinal adenopathy. Neurologic: Grossly intact, no focal deficits, moving all 4 extremities. Psychiatric: Normal mood and affect.   Laboratory Data: Lab Results  Component Value Date   WBC 8.8 12/05/2019   HGB 13.6 12/05/2019   HCT 40.2 12/05/2019   MCV 93.0 12/05/2019   PLT 233.0 12/05/2019    Lab Results  Component Value Date   CREATININE 0.87 08/05/2020    Lab Results  Component Value Date   TSH 1.41 12/05/2019       Component Value Date/Time   CHOL 164 08/05/2020 0802   HDL 54.70 08/05/2020 0802   CHOLHDL 3 08/05/2020 0802   VLDL 27.2 08/05/2020 0802   LDLCALC 82 08/05/2020 0802    Lab Results  Component Value Date   AST 18 08/05/2020   Lab Results  Component Value Date   ALT 16 08/05/2020    Urinalysis ***  I have reviewed the labs.   Pertinent Imaging: Results for DELAINE, HERNANDEZ (MRN [id number]) as of 04/24/2020 14:26  Ref. Range 04/24/2020 13:32  Scan Result Unknown 27   Assessment & Plan:    ***  No follow-ups on file.  These notes generated with voice recognition software. I apologize for typographical errors.  Zara Council, PA-C  [facility] [address]  [place], [place] [address] [phone]

## 2020-10-28 ENCOUNTER — Other Ambulatory Visit
Admission: RE | Admit: 2020-10-28 | Discharge: 2020-10-28 | Disposition: A | Discharge status: Home / Self Care | Payer: Medicare Other | Attending: Urology | Admitting: Urology

## 2020-10-28 ENCOUNTER — Ambulatory Visit (INDEPENDENT_AMBULATORY_CARE_PROVIDER_SITE_OTHER): Payer: Medicare Other | Admitting: Urology

## 2020-10-28 ENCOUNTER — Ambulatory Visit: Payer: Medicare Other | Admitting: Urology

## 2020-10-28 ENCOUNTER — Other Ambulatory Visit: Payer: Self-pay

## 2020-10-28 ENCOUNTER — Encounter: Payer: Self-pay | Admitting: Urology

## 2020-10-28 VITALS — BP 153/72 | HR 76 | Ht 62.0 in | Wt 123.0 lb

## 2020-10-28 DIAGNOSIS — N390 Urinary tract infection, site not specified: Secondary | ICD-10-CM

## 2020-10-28 DIAGNOSIS — N952 Postmenopausal atrophic vaginitis: Secondary | ICD-10-CM

## 2020-10-28 DIAGNOSIS — K5909 Other constipation: Secondary | ICD-10-CM

## 2020-10-28 LAB — URINALYSIS, COMPLETE (UACMP) WITH MICROSCOPIC
Bilirubin Urine: NEGATIVE
Glucose, UA: NEGATIVE mg/dL
Ketones, ur: NEGATIVE mg/dL
Nitrite: NEGATIVE
Specific Gravity, Urine: 1.025 (ref 1.005–1.030)
WBC, UA: >50 WBC/hpf (ref 0–5)
pH: 5.5 (ref 5.0–8.0)

## 2020-10-28 LAB — BLADDER SCAN AMB NON-IMAGING

## 2020-10-28 MED ORDER — SULFAMETHOXAZOLE-TRIMETHOPRIM 800-160 MG PO TABS: 1.0000 | ORAL_TABLET | Freq: Two times a day (BID) | ORAL | 0 refills | Status: DC

## 2020-10-28 NOTE — Progress Notes (Signed)
10/28/2020 11:35 AM   Jamie Elliott June 27, 1946 [id number]  Referring provider: Einar Pheasant, [address] [place],  [place] [address]  Chief Complaint  Patient presents with  . Recurrent UTI    follow up    HPI: Jamie Elliott is a 74 year old female with presumptive rUTI's, vaginal atrophy, chronic constipation who presents today for symptoms of frequency, dysuria and fatigue.    rUTI's Risk factors: age, vaginal atrophy and constipation KUB 12/2017 constipation.  Cystoscopy performed on 02/02/2018 by Dr. Erlene Quan noted subtle trigonitis but no significant pathology.   + E.coli pan sensitive nitrofurantoin 100 mg x 5 days 03/05/2020 + E.coli resistant ampicillin, tetracycline nitrofurantoin 100 mg x 5 day 04/29/2020  Today, she is experiencing dysuria and feeling of incomplete bladder emptying that occurred this morning.  Last week she noted that her urine is malodorous and she had increase of urinary frequency.  She states that her symptoms did abate on Friday, so she canceled her appointment this morning.  She called to reschedule her appointment for today when the dysuria and the feelings of incomplete bladder emptying started.  Patient denies any modifying or aggravating factors.  Patient denies any gross hematuria or suprapubic/flank pain.  Patient denies any fevers, chills, nausea or vomiting.   She states that she is taking cranberry tablets/juice, taking probiotics and increasing her fluids.    Her UA is grossly infected.    Vaginal atrophy Not applying the cream due to fear of cancer.    Constipation States it is under control    PMH: Past Medical History:  Diagnosis Date  . Environmental allergies   . Hypercholesterolemia   . Hypertension   . Migraines     Surgical History: Past Surgical History:  Procedure Laterality Date  . ABDOMINAL HYSTERECTOMY  2000  . APPENDECTOMY  2000  . NOSE SURGERY  1967  . SEPTOPLASTY    . SHOULDER  ARTHROSCOPY WITH OPEN ROTATOR CUFF REPAIR Right 12/05/2015   Procedure: SHOULDER ARTHROSCOPY WITH MINI OPEN ROTATOR CUFF REPAIR. DISTAL CLAVICLE EXCISION;  Surgeon: Thornton Park, MD;  Location: [facility] ORS;  Service: Orthopedics;  Laterality: Right;    Home Medications:  Current Outpatient Medications on File Prior to Visit  Medication Sig Dispense Refill  . acetaminophen (TYLENOL) 500 MG tablet Take 500 mg by mouth every 6 (six) hours as needed.    Marland Kitchen amLODipine (NORVASC) 5 MG tablet Take 1 tablet (5 mg total) by mouth daily. 30 tablet 11  . aspirin 325 MG EC tablet Take 325 mg by mouth daily.    Marland Kitchen atorvastatin (LIPITOR) 40 MG tablet Take 1 tablet (40 mg total) by mouth daily. 30 tablet 0  . cetirizine (ZYRTEC) 10 MG tablet Take 1 tablet (10 mg total) by mouth daily. 30 tablet 11  . CRANBERRY EXTRACT PO Take 1 capsule by mouth 2 (two) times daily.    . hydrochlorothiazide (HYDRODIURIL) 12.5 MG tablet TAKE ONE TABLET DAILY. 90 tablet 0  . losartan (COZAAR) 100 MG tablet Take 1 tablet by mouth daily. 90 tablet 0  . Probiotic Product (ALIGN PO) Take by mouth as needed.    . triamcinolone (NASACORT) 55 MCG/ACT nasal inhaler Place 2 sprays into the nose as needed. 1 Inhaler 5   No current facility-administered medications on file prior to visit.    Allergies:  Allergies  Allergen Reactions  . Ciprofloxacin Other (See Comments)    Patient reports "it doesn't work for me". Last culture was sensitive,  however.   . Zithromax [Azithromycin]   . Augmentin [Amoxicillin-Pot Clavulanate] Rash    Family History: Family History  Problem Relation Age of Onset  . Heart disease Mother   . Hyperlipidemia Mother   . Hypertension Mother   . Cancer Father        lung, prostate  . Hyperlipidemia Father   . Hypertension Father   . Prostate cancer Father   . Bladder Cancer Neg Hx   . Kidney cancer Neg Hx     Social History:  reports that she has never smoked. She has never used smokeless tobacco.  She reports that she does not drink alcohol and does not use drugs.  ROS: Pertinent ROS in HPI  Physical Exam: BP (!) 153/72   Pulse 76   Ht 5\' 2"  (1.575 m)   Wt 123 lb (55.8 kg)   LMP 11/28/1998   BMI 22.50 kg/m   Constitutional:  Well nourished. Alert and oriented, No acute distress. HEENT: [place] AT, mask in place.  Trachea midline Cardiovascular: No clubbing, cyanosis, or edema. Respiratory: Normal respiratory effort, no increased work of breathing. Neurologic: Grossly intact, no focal deficits, moving all 4 extremities. Psychiatric: Normal mood and affect.   Laboratory Data: Lab Results  Component Value Date   WBC 8.8 12/05/2019   HGB 13.6 12/05/2019   HCT 40.2 12/05/2019   MCV 93.0 12/05/2019   PLT 233.0 12/05/2019    Lab Results  Component Value Date   CREATININE 0.87 08/05/2020    Lab Results  Component Value Date   TSH 1.41 12/05/2019       Component Value Date/Time   CHOL 164 08/05/2020 0802   HDL 54.70 08/05/2020 0802   CHOLHDL 3 08/05/2020 0802   VLDL 27.2 08/05/2020 0802   LDLCALC 82 08/05/2020 0802    Lab Results  Component Value Date   AST 18 08/05/2020   Lab Results  Component Value Date   ALT 16 08/05/2020    Urinalysis Component     Latest Ref Rng & Units 10/28/2020  Color, Urine     YELLOW YELLOW  Appearance     CLEAR CLOUDY (A)  Specific Gravity, Urine     1.005 - 1.030 1.025  pH     5.0 - 8.0 5.5  Glucose, UA     NEGATIVE mg/dL NEGATIVE  Hgb urine dipstick     NEGATIVE SMALL (A)  Bilirubin Urine     NEGATIVE NEGATIVE  Ketones, ur     NEGATIVE mg/dL NEGATIVE  Protein     NEGATIVE mg/dL TRACE (A)  Nitrite     NEGATIVE NEGATIVE  Leukocytes,Ua     NEGATIVE MODERATE (A)  Squamous Epithelial / LPF     0 - 5 0-5  WBC, UA     0 - 5 WBC/hpf >50  RBC / HPF     0 - 5 RBC/hpf 0-5  Bacteria, UA     NONE SEEN MANY (A)  WBC Clumps      PRESENT  I have reviewed the labs.   Pertinent Imaging:   Assessment & Plan:     1. Recurrent UTI - US RENAL; Future - BLADDER SCAN AMB NON-IMAGING  2. Vaginal atrophy Patient does not want to apply the vaginal estrogen cream for her fear of cancer.  I explained to her that she is using such a small amount that the risk for cancer is low.  I also explained that studies have demonstrated that consistent application of vaginal estrogen  cream actually reduces the episodes of recurrent urinary tract infections, but it takes up to 8 months of consistent application to make the necessary physiological changes in the vagina to prevent these infections    I also reviewed preventative strategies with the patient and will order renal ultrasound to evaluate for any possible nidus for her recurrent UTIs UA is grossly infected - will send for culture - will start Septra DS and adjust if necessary  Return for RUS report .  These notes generated with voice recognition software. I apologize for typographical errors.  Zara Council, PA-C  [facility] [address]  [place], [place] [address] [phone]

## 2020-10-28 NOTE — Patient Instructions (Signed)
Take cranberry tablets, 1000 mg of Vitamin C, probiotics, D-mannose, apply vaginal estrogen cream three nights weekly, control constipation, no tub baths, wipe front to back, drink a lot of water, void before and after intercourse and make sure partner also voids before and after intercourse and clean any sexual enhancement instruments before and after sex.

## 2020-10-30 LAB — URINE CULTURE: Culture: >=100000 — AB

## 2020-10-31 ENCOUNTER — Other Ambulatory Visit: Payer: Self-pay | Admitting: Internal Medicine

## 2020-11-10 NOTE — Progress Notes (Signed)
11/11/2020 11:08 AM   Jamie Elliott 05/22/1946 [id number]  Referring provider: Einar Elliott, [place] [place],  [place] [address]  Chief Complaint  Patient presents with  . Recurrent UTI    follow up    HPI: Jamie Elliott is a 74 year old female with presumptive rUTI's, vaginal atrophy, chronic constipation who presents today for symptoms of frequency, dysuria and fatigue.    rUTI's Risk factors: age, vaginal atrophy and constipation KUB 12/2017 constipation.  Cystoscopy performed on 02/02/2018 by Dr. Erlene Elliott noted subtle trigonitis but no significant pathology.   + E.coli pan sensitive nitrofurantoin 100 mg x 5 days 03/05/2020 + E.coli resistant ampicillin, tetracycline nitrofurantoin 100 mg x 5 day 04/29/2020 + E. Coli pan-sensitive Septra DS x 7 days 10/28/2020  She stated that she is taking her cranberry, probiotics and increasing her water intake and taking her vitamin C. She has been unable to find the d-mannose.  She had been taking MiraLAX daily, but she did not have normal bowel movements it just turned to runny stool. She has since stopped the MiraLAX.  She also has not started her vaginal estrogen cream as she felt having diarrhea she would just wipe it off  RUS pending   Her UA is benign.    Vaginal atrophy Not applying the cream due to diarrhea.    Constipation Now having diarrhea due to the MiraLax   PMH: Past Medical History:  Diagnosis Date  . Environmental allergies   . Hypercholesterolemia   . Hypertension   . Migraines     Surgical History: Past Surgical History:  Procedure Laterality Date  . ABDOMINAL HYSTERECTOMY  2000  . APPENDECTOMY  2000  . NOSE SURGERY  1967  . SEPTOPLASTY    . SHOULDER ARTHROSCOPY WITH OPEN ROTATOR CUFF REPAIR Right 12/05/2015   Procedure: SHOULDER ARTHROSCOPY WITH MINI OPEN ROTATOR CUFF REPAIR. DISTAL CLAVICLE EXCISION;  Surgeon: Jamie Park, MD;  Location: [facility] ORS;  Service:  Orthopedics;  Laterality: Right;    Home Medications:  Current Outpatient Medications on File Prior to Visit  Medication Sig Dispense Refill  . acetaminophen (TYLENOL) 500 MG tablet Take 500 mg by mouth every 6 (six) hours as needed.    Marland Kitchen amLODipine (NORVASC) 5 MG tablet Take 1 tablet (5 mg total) by mouth daily. 30 tablet 11  . aspirin 325 MG EC tablet Take 325 mg by mouth daily.    Marland Kitchen atorvastatin (LIPITOR) 40 MG tablet Take 1 tablet (40 mg total) by mouth daily. 30 tablet 0  . cetirizine (ZYRTEC) 10 MG tablet Take 1 tablet (10 mg total) by mouth daily. 30 tablet 11  . CRANBERRY EXTRACT PO Take 1 capsule by mouth 2 (two) times daily.    . hydrochlorothiazide (HYDRODIURIL) 12.5 MG tablet TAKE ONE TABLET DAILY. 90 tablet 0  . losartan (COZAAR) 100 MG tablet Take 1 tablet by mouth daily. 90 tablet 0  . Probiotic Product (ALIGN PO) Take by mouth as needed.    . triamcinolone (NASACORT) 55 MCG/ACT nasal inhaler Place 2 sprays into the nose as needed. 1 Inhaler 5   No current facility-administered medications on file prior to visit.    Allergies:  Allergies  Allergen Reactions  . Ciprofloxacin Other (See Comments)    Patient reports "it doesn't work for me". Last culture was sensitive, however.   . Zithromax [Azithromycin]   . Augmentin [Amoxicillin-Pot Clavulanate] Rash    Family History: Family History  Problem Relation Age of Onset  .  Heart disease Mother   . Hyperlipidemia Mother   . Hypertension Mother   . Cancer Father        lung, prostate  . Hyperlipidemia Father   . Hypertension Father   . Prostate cancer Father   . Bladder Cancer Neg Hx   . Kidney cancer Neg Hx     Social History:  reports that she has never smoked. She has never used smokeless tobacco. She reports that she does not drink alcohol and does not use drugs.  ROS: Pertinent ROS in HPI  Physical Exam: BP 122/77   Pulse (!) 108   Ht 5\' 2"  (1.575 m)   Wt 123 lb (55.8 kg)   LMP 11/28/1998   BMI  22.50 kg/m   Constitutional:  Well nourished. Alert and oriented, No acute distress. HEENT: [place] AT, mask in place  Trachea midline Cardiovascular: No clubbing, cyanosis, or edema. Respiratory: Normal respiratory effort, no increased work of breathing. Neurologic: Grossly intact, no focal deficits, moving all 4 extremities. Psychiatric: Normal mood and affect.   Laboratory Data: Lab Results  Component Value Date   WBC 8.8 12/05/2019   HGB 13.6 12/05/2019   HCT 40.2 12/05/2019   MCV 93.0 12/05/2019   PLT 233.0 12/05/2019    Lab Results  Component Value Date   CREATININE 0.87 08/05/2020    Lab Results  Component Value Date   TSH 1.41 12/05/2019       Component Value Date/Time   CHOL 164 08/05/2020 0802   HDL 54.70 08/05/2020 0802   CHOLHDL 3 08/05/2020 0802   VLDL 27.2 08/05/2020 0802   LDLCALC 82 08/05/2020 0802    Lab Results  Component Value Date   AST 18 08/05/2020   Lab Results  Component Value Date   ALT 16 08/05/2020    Urinalysis Component     Latest Ref Rng & Units 11/11/2020  Color, Urine     YELLOW STRAW (A)  Appearance     CLEAR CLEAR  Specific Gravity, Urine     1.005 - 1.030 1.010  pH     5.0 - 8.0 6.0  Glucose, UA     NEGATIVE mg/dL NEGATIVE  Hgb urine dipstick     NEGATIVE NEGATIVE  Bilirubin Urine     NEGATIVE NEGATIVE  Ketones, ur     NEGATIVE mg/dL NEGATIVE  Protein     NEGATIVE mg/dL NEGATIVE  Nitrite     NEGATIVE NEGATIVE  Leukocytes,Ua     NEGATIVE NEGATIVE  Squamous Epithelial / LPF     0 - 5 0-5  WBC, UA     0 - 5 WBC/hpf 0-5  RBC / HPF     0 - 5 RBC/hpf 0-5  Bacteria, UA     NONE SEEN NONE SEEN   I have reviewed the labs.   Pertinent Imaging: Results for Jamie, Elliott (MRN [id number]) as of 11/10/2020 18:47  Ref. Range 10/28/2020 11:34  Scan Result Unknown 52ml    Assessment & Plan:    1. Recurrent UTI - Urinalysis, Complete w Microscopic; Future - Urine Culture; Future - RUS pending  2. Vaginal  atrophy - patient's Premarin cream is from 2019 - I have sent in a prescription for Estrace cream to apply nightly Monday, Wednesday and Friday nights  Return for RUS report .  These notes generated with voice recognition software. I apologize for typographical errors.  Jamie Council, PA-C  [place] [address]  [address] [place], [place] [address] [phone]    

## 2020-11-11 ENCOUNTER — Ambulatory Visit (INDEPENDENT_AMBULATORY_CARE_PROVIDER_SITE_OTHER): Payer: Medicare Other | Admitting: Urology

## 2020-11-11 ENCOUNTER — Other Ambulatory Visit: Payer: Self-pay

## 2020-11-11 ENCOUNTER — Other Ambulatory Visit
Admission: RE | Admit: 2020-11-11 | Discharge: 2020-11-11 | Disposition: A | Discharge status: Home / Self Care | Payer: Medicare Other | Attending: Urology | Admitting: Urology

## 2020-11-11 ENCOUNTER — Encounter: Payer: Self-pay | Admitting: Urology

## 2020-11-11 VITALS — BP 122/77 | HR 108 | Ht 62.0 in | Wt 123.0 lb

## 2020-11-11 DIAGNOSIS — N952 Postmenopausal atrophic vaginitis: Secondary | ICD-10-CM | POA: Diagnosis not present

## 2020-11-11 DIAGNOSIS — N390 Urinary tract infection, site not specified: Secondary | ICD-10-CM | POA: Diagnosis not present

## 2020-11-11 LAB — URINALYSIS, COMPLETE (UACMP) WITH MICROSCOPIC
Bacteria, UA: NONE SEEN
Bilirubin Urine: NEGATIVE
Glucose, UA: NEGATIVE mg/dL
Hgb urine dipstick: NEGATIVE
Ketones, ur: NEGATIVE mg/dL
Leukocytes,Ua: NEGATIVE
Nitrite: NEGATIVE
Protein, ur: NEGATIVE mg/dL
Specific Gravity, Urine: 1.01 (ref 1.005–1.030)
pH: 6 (ref 5.0–8.0)

## 2020-11-11 MED ORDER — ESTRADIOL 0.1 MG/GM VA CREA: TOPICAL_CREAM | VAGINAL | 12 refills | Status: DC

## 2020-11-11 NOTE — Patient Instructions (Signed)
Please apply 0.5mg  (pea-sized amount)  just inside the vaginal introitus with a finger-tip on Monday, Wednesday and Friday nights,

## 2020-11-12 LAB — URINE CULTURE: Culture: NO GROWTH

## 2020-11-13 ENCOUNTER — Ambulatory Visit
Admission: RE | Admit: 2020-11-13 | Discharge: 2020-11-13 | Disposition: A | Discharge status: Home / Self Care | Payer: Medicare Other | Source: Ambulatory Visit | Attending: Urology | Admitting: Urology

## 2020-11-13 ENCOUNTER — Telehealth: Payer: Self-pay | Admitting: Family Medicine

## 2020-11-13 ENCOUNTER — Other Ambulatory Visit: Payer: Self-pay

## 2020-11-13 ENCOUNTER — Encounter: Payer: Self-pay | Admitting: Family Medicine

## 2020-11-13 DIAGNOSIS — N390 Urinary tract infection, site not specified: Secondary | ICD-10-CM

## 2020-11-13 NOTE — Telephone Encounter (Signed)
Left message for patient to return call. Mychart message was also sent.

## 2020-11-13 NOTE — Telephone Encounter (Signed)
-----   Message from Nori Riis, PA-C sent at 11/12/2020  9:23 PM EST ----- Please let Mrs. Klammer know that her urine culture was negative.

## 2020-11-17 NOTE — Progress Notes (Signed)
11/18/2020 9:03 AM   Jamie Elliott 1946-12-01 [id number]  Referring provider: Einar Pheasant, [address] [place],  [place] [address]  Chief Complaint  Patient presents with  . Follow-up    RUS report    HPI: Jamie Elliott is a 74 year old female with presumptive rUTI's, vaginal atrophy, chronic constipation who presents today to discuss her RUS report.   rUTI's Risk factors: age, vaginal atrophy and constipation KUB 12/2017 constipation.  Cystoscopy performed on 02/02/2018 by Dr. Erlene Quan noted subtle trigonitis but no significant pathology.   + E.coli pan sensitive nitrofurantoin 100 mg x 5 days 03/05/2020 + E.coli resistant ampicillin, tetracycline nitrofurantoin 100 mg x 5 day 04/29/2020 + E. Coli pan-sensitive Septra DS x 7 days 10/28/2020  She stated that she is taking her cranberry, probiotics and increasing her water intake and taking her vitamin C. She has been unable to find the d-mannose.  She had been taking MiraLAX daily, but she did not have normal bowel movements it just turned to runny stool. She has since stopped the MiraLAX.  She also has not started her vaginal estrogen cream as she felt having diarrhea she would just wipe it off.  Urine culture was negative from last visit.    RUS 11/13/2020 ~ 2 cm cyst in the left kidney and a ~ 1 cm hyperechoic lesion in the right likely an angiomyolipoma.  Today, she still feels fatigued.  She still continues to "feel full" in her bladder.  Cystoscopy with Dr. Erlene Quan in 12/2017 was NED.  She thinks getting her up with her son at 4 am may be contributing to her fatigued.   Patient denies any modifying or aggravating factors.  Patient denies any gross hematuria, dysuria or flank pain.  Patient denies any fevers, chills, nausea or vomiting.   Patient had cervix, uterus and ovaries removed in 2000.  No malignancies noted.    Vaginal atrophy She has started the vaginal estrogen cream and is  applying it three nights weekly.    Constipation Now having diarrhea due to the MiraLax   PMH: Past Medical History:  Diagnosis Date  . Environmental allergies   . Hypercholesterolemia   . Hypertension   . Migraines     Surgical History: Past Surgical History:  Procedure Laterality Date  . ABDOMINAL HYSTERECTOMY  2000  . APPENDECTOMY  2000  . NOSE SURGERY  1967  . SEPTOPLASTY    . SHOULDER ARTHROSCOPY WITH OPEN ROTATOR CUFF REPAIR Right 12/05/2015   Procedure: SHOULDER ARTHROSCOPY WITH MINI OPEN ROTATOR CUFF REPAIR. DISTAL CLAVICLE EXCISION;  Surgeon: Thornton Park, MD;  Location: [facility] ORS;  Service: Orthopedics;  Laterality: Right;    Home Medications:  Current Outpatient Medications on File Prior to Visit  Medication Sig Dispense Refill  . acetaminophen (TYLENOL) 500 MG tablet Take 500 mg by mouth every 6 (six) hours as needed.    Marland Kitchen amLODipine (NORVASC) 5 MG tablet Take 1 tablet (5 mg total) by mouth daily. 30 tablet 11  . aspirin 325 MG EC tablet Take 325 mg by mouth daily.    Marland Kitchen atorvastatin (LIPITOR) 40 MG tablet Take 1 tablet (40 mg total) by mouth daily. 30 tablet 0  . cetirizine (ZYRTEC) 10 MG tablet Take 1 tablet (10 mg total) by mouth daily. 30 tablet 11  . CRANBERRY EXTRACT PO Take 1 capsule by mouth 2 (two) times daily.    Marland Kitchen estradiol (ESTRACE VAGINAL) 0.1 MG/GM vaginal cream Apply 0.5mg  (pea-sized amount)  just inside the vaginal introitus with a finger-tip on Monday, Wednesday and Friday nights. 30 g 12  . hydrochlorothiazide (HYDRODIURIL) 12.5 MG tablet TAKE ONE TABLET DAILY. 90 tablet 0  . losartan (COZAAR) 100 MG tablet Take 1 tablet by mouth daily. 90 tablet 0  . Probiotic Product (ALIGN PO) Take by mouth as needed.    . triamcinolone (NASACORT) 55 MCG/ACT nasal inhaler Place 2 sprays into the nose as needed. 1 Inhaler 5   No current facility-administered medications on file prior to visit.    Allergies:  Allergies  Allergen Reactions  . Ciprofloxacin  Other (See Comments)    Patient reports "it doesn't work for me". Last culture was sensitive, however.   . Zithromax [Azithromycin]   . Augmentin [Amoxicillin-Pot Clavulanate] Rash    Family History: Family History  Problem Relation Age of Onset  . Heart disease Mother   . Hyperlipidemia Mother   . Hypertension Mother   . Cancer Father        lung, prostate  . Hyperlipidemia Father   . Hypertension Father   . Prostate cancer Father   . Bladder Cancer Neg Hx   . Kidney cancer Neg Hx     Social History:  reports that she has never smoked. She has never used smokeless tobacco. She reports that she does not drink alcohol and does not use drugs.  ROS: Pertinent ROS in HPI  Physical Exam: BP (!) 142/84   Pulse 84   Ht 5\' 2"  (1.575 m)   Wt 124 lb (56.2 kg)   LMP 11/28/1998   BMI 22.68 kg/m   Constitutional:  Well nourished. Alert and oriented, No acute distress. HEENT: [place] AT, mask in place.  Trachea midline Cardiovascular: No clubbing, cyanosis, or edema. Respiratory: Normal respiratory effort, no increased work of breathing. Neurologic: Grossly intact, no focal deficits, moving all 4 extremities. Psychiatric: Normal mood and affect.   Laboratory Data: Lab Results  Component Value Date   WBC 8.8 12/05/2019   HGB 13.6 12/05/2019   HCT 40.2 12/05/2019   MCV 93.0 12/05/2019   PLT 233.0 12/05/2019    Lab Results  Component Value Date   CREATININE 0.87 08/05/2020    Lab Results  Component Value Date   TSH 1.41 12/05/2019       Component Value Date/Time   CHOL 164 08/05/2020 0802   HDL 54.70 08/05/2020 0802   CHOLHDL 3 08/05/2020 0802   VLDL 27.2 08/05/2020 0802   LDLCALC 82 08/05/2020 0802    Lab Results  Component Value Date   AST 18 08/05/2020   Lab Results  Component Value Date   ALT 16 08/05/2020   I have reviewed the labs.   Pertinent Imaging: CLINICAL DATA:  Recurrent UTI.  EXAM: RENAL / URINARY TRACT ULTRASOUND COMPLETE  COMPARISON:   Abdominal ultrasound dated December 19, 2013.  FINDINGS: Right Kidney:  Renal measurem 8.4 x 4.9 x 3.8 cm ents: 8.8 = volume: 86 mL. Echogenicity within normal limits. No mass or hydronephrosis visualized. 9 mm nonshadowing echogenic focus in the lateral midpole.  Left Kidney:  Renal measurements: 10.2 x 4.0 x 4.5 cm = volume: 97 mL. Echogenicity within normal limits. No mass or hydronephrosis visualized. 1.8 cm simple cyst in the upper pole.  Bladder:  Appears normal for degree of bladder distention.  Other:  None.  IMPRESSION: 1. No acute abnormality. 2. 9 mm nonshadowing echogenic focus in the right kidney, likely a small angiomyolipoma. 3. 1.8 cm simple cyst in the  left kidney.   Electronically Signed   By: Titus Dubin M.D.   On: 11/13/2020 14:47   Result History  US RENAL (Order #[id number]) on 11/13/2020 - Order Result History Report  Results for TONEKA, FULLEN (MRN [id number]) as of 11/10/2020 18:47  Ref. Range 10/28/2020 11:34  Scan Result Unknown 66ml    Assessment & Plan:    1. Recurrent UTI - No nidus seen on RUS - patient to present to [place] with symptoms of UTI  2. Vaginal atrophy - continue Estrace cream to apply nightly Monday, Wednesday and Friday nights  3. Right renal angiomyolipoma - will obtain a RUS in 6 months to evaluate stability    Return in about 6 months (around 05/18/2021) for RUS and symptom recheck .  These notes generated with voice recognition software. I apologize for typographical errors.  Zara Council, PA-C  [facility] [address]  [place], [place] [address] [phone]

## 2020-11-18 ENCOUNTER — Ambulatory Visit (INDEPENDENT_AMBULATORY_CARE_PROVIDER_SITE_OTHER): Payer: Medicare Other | Admitting: Urology

## 2020-11-18 ENCOUNTER — Encounter: Payer: Self-pay | Admitting: Urology

## 2020-11-18 ENCOUNTER — Encounter: Payer: Self-pay | Admitting: Internal Medicine

## 2020-11-18 ENCOUNTER — Other Ambulatory Visit: Payer: Self-pay

## 2020-11-18 VITALS — BP 142/84 | HR 84 | Ht 62.0 in | Wt 124.0 lb

## 2020-11-18 DIAGNOSIS — N952 Postmenopausal atrophic vaginitis: Secondary | ICD-10-CM | POA: Diagnosis not present

## 2020-11-18 DIAGNOSIS — D1771 Benign lipomatous neoplasm of kidney: Secondary | ICD-10-CM

## 2020-11-18 DIAGNOSIS — N390 Urinary tract infection, site not specified: Secondary | ICD-10-CM

## 2020-11-18 DIAGNOSIS — D219 Benign neoplasm of connective and other soft tissue, unspecified: Secondary | ICD-10-CM | POA: Insufficient documentation

## 2020-11-27 ENCOUNTER — Other Ambulatory Visit: Payer: Self-pay | Admitting: Internal Medicine

## 2020-12-02 ENCOUNTER — Other Ambulatory Visit: Payer: Self-pay | Admitting: Internal Medicine

## 2020-12-04 ENCOUNTER — Telehealth: Payer: Self-pay | Admitting: Internal Medicine

## 2020-12-04 DIAGNOSIS — E7800 Pure hypercholesterolemia, unspecified: Secondary | ICD-10-CM

## 2020-12-04 DIAGNOSIS — I10 Essential (primary) hypertension: Secondary | ICD-10-CM

## 2020-12-04 DIAGNOSIS — M810 Age-related osteoporosis without current pathological fracture: Secondary | ICD-10-CM

## 2020-12-04 NOTE — Telephone Encounter (Signed)
Patient is requesting labs before her appt but I was not sure what you wanted ordered

## 2020-12-04 NOTE — Telephone Encounter (Signed)
Order placed for labs.  Please schedule lab appt one week before her scheduled. Appt.

## 2020-12-04 NOTE — Telephone Encounter (Signed)
Patient called in to schedule a appointment and wanted labs before appointment on 02-27-2021

## 2020-12-05 NOTE — Telephone Encounter (Signed)
Spoke with patient to try to schedule labs for march but patient had already called and moved appts to jan 2022

## 2020-12-10 ENCOUNTER — Ambulatory Visit (INDEPENDENT_AMBULATORY_CARE_PROVIDER_SITE_OTHER): Payer: Medicare Other

## 2020-12-10 VITALS — Ht 62.0 in | Wt 124.0 lb

## 2020-12-10 DIAGNOSIS — Z0000 Encounter for general adult medical examination without abnormal findings: Secondary | ICD-10-CM | POA: Diagnosis not present

## 2020-12-10 NOTE — Progress Notes (Signed)
Subjective:   Jamie Elliott is a 74 y.o. female who presents for Medicare Annual (Subsequent) preventive examination.  Review of Systems    No ROS.  Medicare Wellness Virtual Visit.       Objective:    Today's Vitals   12/10/20 0935  Weight: 124 lb (56.2 kg)  Height: 5\' 2"  (1.575 m)   Body mass index is 22.68 kg/m.  Advanced Directives 12/10/2020 12/08/2019 10/04/2018 09/30/2017 09/30/2016 12/31/2015 12/05/2015  Does Patient Have a Medical Advance Directive? Yes Yes Yes Yes Yes Yes Yes  Type of Paramedic of [place];Living will [place];Living will Living will;Healthcare Power of [place];Living will [place];Living will - [place];Living will  Does patient want to make changes to medical advance directive? No - Patient declined No - Patient declined No - Patient declined No - Patient declined - - No - Patient declined  Copy of [place] in Chart? Yes - validated most recent copy scanned in chart (See row information) Yes - validated most recent copy scanned in chart (See row information) No - copy requested No - copy requested No - copy requested No - copy requested No - copy requested    Current Medications (verified) Outpatient Encounter Medications as of 12/10/2020  Medication Sig  . acetaminophen (TYLENOL) 500 MG tablet Take 500 mg by mouth every 6 (six) hours as needed.  Marland Kitchen amLODipine (NORVASC) 5 MG tablet Take 1 tablet (5 mg total) by mouth daily.  Marland Kitchen aspirin 325 MG EC tablet Take 325 mg by mouth daily.  Marland Kitchen atorvastatin (LIPITOR) 40 MG tablet Take 1 tablet (40 mg total) by mouth daily.  . cetirizine (ZYRTEC) 10 MG tablet Take 1 tablet (10 mg total) by mouth daily.  Marland Kitchen CRANBERRY EXTRACT PO Take 1 capsule by mouth 2 (two) times daily.  Marland Kitchen estradiol (ESTRACE VAGINAL) 0.1 MG/GM vaginal cream Apply 0.5mg  (pea-sized amount)  just inside the vaginal  introitus with a finger-tip on Monday, Wednesday and Friday nights.  . hydrochlorothiazide (HYDRODIURIL) 12.5 MG tablet TAKE ONE TABLET DAILY.  Marland Kitchen losartan (COZAAR) 100 MG tablet Take 1 tablet by mouth daily.  . Probiotic Product (ALIGN PO) Take by mouth as needed.  . triamcinolone (NASACORT) 55 MCG/ACT nasal inhaler Place 2 sprays into the nose as needed.   No facility-administered encounter medications on file as of 12/10/2020.    Allergies (verified) Ciprofloxacin, Zithromax [azithromycin], and Augmentin [amoxicillin-pot clavulanate]   History: Past Medical History:  Diagnosis Date  . Environmental allergies   . Hypercholesterolemia   . Hypertension   . Migraines    Past Surgical History:  Procedure Laterality Date  . ABDOMINAL HYSTERECTOMY  2000  . APPENDECTOMY  2000  . NOSE SURGERY  1967  . SEPTOPLASTY    . SHOULDER ARTHROSCOPY WITH OPEN ROTATOR CUFF REPAIR Right 12/05/2015   Procedure: SHOULDER ARTHROSCOPY WITH MINI OPEN ROTATOR CUFF REPAIR. DISTAL CLAVICLE EXCISION;  Surgeon: Thornton Park, MD;  Location: [facility] ORS;  Service: Orthopedics;  Laterality: Right;   Family History  Problem Relation Age of Onset  . Heart disease Mother   . Hyperlipidemia Mother   . Hypertension Mother   . Cancer Father        lung, prostate  . Hyperlipidemia Father   . Hypertension Father   . Prostate cancer Father   . Bladder Cancer Neg Hx   . Kidney cancer Neg Hx    Social History  Socioeconomic History  . Marital status: Married    Spouse name: Not on file  . Number of children: 2  . Years of education: Not on file  . Highest education level: Not on file  Occupational History  . Not on file  Tobacco Use  . Smoking status: Never Smoker  . Smokeless tobacco: Never Used  Substance and Sexual Activity  . Alcohol use: No    Alcohol/week: 0.0 standard drinks  . Drug use: No  . Sexual activity: Not Currently  Other Topics Concern  . Not on file  Social History Narrative  .  Not on file   Social Determinants of Health   Financial Resource Strain: Low Risk   . Difficulty of Paying Living Expenses: Not hard at all  Food Insecurity: No Food Insecurity  . Worried About Charity fundraiser in the Last Year: Never true  . Ran Out of Food in the Last Year: Never true  Transportation Needs: No Transportation Needs  . Lack of Transportation (Medical): No  . Lack of Transportation (Non-Medical): No  Physical Activity: Not on file  Stress: No Stress Concern Present  . Feeling of Stress : Not at all  Social Connections: Unknown  . Frequency of Communication with Friends and Family: More than three times a week  . Frequency of Social Gatherings with Friends and Family: More than three times a week  . Attends Religious Services: Not on file  . Active Member of Clubs or Organizations: Not on file  . Attends Archivist Meetings: Not on file  . Marital Status: Married    Tobacco Counseling Counseling given: Not Answered   Clinical Intake:  Pre-visit preparation completed: Yes        Diabetes: No  How often do you need to have someone help you when you read instructions, pamphlets, or other written materials from your doctor or pharmacy?: 1 - Never Interpreter Needed?: No      Activities of Daily Living In your present state of health, do you have any difficulty performing the following activities: 12/10/2020  Hearing? N  Vision? N  Difficulty concentrating or making decisions? N  Walking or climbing stairs? N  Dressing or bathing? N  Doing errands, shopping? N  Preparing Food and eating ? N  Using the Toilet? N  In the past six months, have you accidently leaked urine? N  Do you have problems with loss of bowel control? N  Managing your Medications? N  Managing your Finances? N  Housekeeping or managing your Housekeeping? N  Some recent data might be hidden    Patient Care Team: Einar Pheasant, MD as PCP - General (Internal  Medicine)  Indicate any recent Medical Services you may have received from other than [facility] providers in the past year (date may be approximate).     Assessment:   This is a routine wellness examination for [facility].  Video connection was lost when less than 50% of the duration of the visit was complete, at which time the remainder of the visit was completed via audio only.  I connected with Marzee today by video then telephone and verified that I am speaking with the correct person using two identifiers. Location patient: home Location provider: work Persons participating in the virtual visit: patient, Marine scientist.    I discussed the limitations, risks, security and privacy concerns of performing an evaluation and management service by telephone and the availability of in person appointments. The patient expressed understanding and verbally  consented to this telephonic visit.    Interactive audio and video telecommunications were attempted between this provider and patient, however failed, due to patient having technical difficulties OR patient did not have access to video capability.  We continued and completed visit with audio only.  Some vital signs may be absent or patient reported.   Hearing/Vision screen  Hearing Screening   125Hz  250Hz  500Hz  1000Hz  2000Hz  3000Hz  4000Hz  6000Hz  8000Hz   Right ear:           Left ear:           Comments: Patient is able to hear conversational tones without difficulty.  No issues reported.  Vision Screening Comments: Followed by [facility]  Wears corrective lenses  Visual acuity not assessed per patient preference since they have regular follow up with the ophthalmologist  Dietary issues and exercise activities discussed: Current Exercise Habits: Home exercise routine, Type of exercise: walking, Intensity: Mild  Goals      Patient Stated   .  Follow up with pcp as needed (pt-stated)      Depression Screen PHQ 2/9 Scores 12/10/2020 04/18/2020  12/08/2019 10/04/2018 06/10/2018 09/30/2017 10/28/2016  PHQ - 2 Score 0 0 0 0 0 0 0  PHQ- 9 Score - - - - - 0 -    Fall Risk Fall Risk  12/10/2020 04/18/2020 12/08/2019 09/12/2019 10/04/2018  Falls in the past year? 1 0 0 0 No  Number falls in past yr: 0 0 - - -  Comment Mistepped. No injury. - - - -  Injury with Fall? 0 - - - -  Comment - - - - -  Follow up Falls evaluation completed Falls evaluation completed Falls prevention discussed Falls evaluation completed -    FALL RISK PREVENTION PERTAINING TO THE HOME: Handrails in use when climbing stairs? Yes Home free of loose throw rugs in walkways, pet beds, electrical cords, etc? Yes  Adequate lighting in your home to reduce risk of falls? Yes   ASSISTIVE DEVICES UTILIZED TO PREVENT FALLS: Life alert? No  Use of a cane, walker or w/c? No   TIMED UP AND GO: Was the test performed? No . Virtual visit.   Cognitive Function: Patient is alert and oriented x3.  Denies difficulty focusing, making decisions, memory loss.  MMSE/6CIT deferred per patient preference. Normal by direct communication/observation.  MMSE - Mini Mental State Exam 10/04/2018 09/30/2017 09/30/2016 09/30/2015  Orientation to time 5 5 5 5   Orientation to Place 5 5 5 5   Registration 3 3 3 3   Attention/ Calculation 5 5 5 5   Recall 3 3 3 3   Language- name 2 objects 2 2 2 2   Language- repeat 1 1 1 1   Language- follow 3 step command 3 3 3 3   Language- read & follow direction 1 1 1 1   Write a sentence 1 1 1 1   Copy design 1 1 1 1   Total score 30 30 30 30      6CIT Screen 12/08/2019  What Year? 0 points  What month? 0 points  What time? 0 points  Count back from 20 0 points  Months in reverse 0 points  Repeat phrase 2 points  Total Score 2    Immunizations Immunization History  Administered Date(s) Administered  . Influenza Split 10/27/2012  . Influenza, High Dose Seasonal PF 09/30/2016, 10/04/2018  . Influenza,inj,Quad PF,6+ Mos 08/16/2014, 08/20/2015  .  Influenza,inj,quad, With Preservative 10/20/2017, 10/17/2019  . Influenza-Unspecified 09/25/2020  . PFIZER SARS-COV-2 Vaccination  02/01/2020, 02/22/2020, 09/25/2020  . Pneumococcal Conjugate-13 10/17/2019  . Pneumococcal Polysaccharide-23 06/05/2013  . Tdap 01/11/2013  . Zoster 06/04/2011   Health Maintenance Health Maintenance  Topic Date Due  . MAMMOGRAM  01/10/2021  . COLONOSCOPY  12/10/2021  . TETANUS/TDAP  01/11/2023  . INFLUENZA VACCINE  Completed  . DEXA SCAN  Completed  . COVID-19 Vaccine  Completed  . Hepatitis C Screening  Completed  . PNA vac Low Risk Adult  Completed   Colorectal cancer screening: Type of screening: Colonoscopy. Completed 12/11/11. Repeat every 10 years  Mammogram status: Completed 01/11/20. Repeat every year  Bone Density- 07/28/17.   Lung Cancer Screening: (Low Dose CT Chest recommended if Age 58-80 years, 30 pack-year currently smoking OR have quit w/in 15years.) does not qualify.   Hepatitis C Screening: Completed 09/30/15  Vision Screening: Recommended annual ophthalmology exams for early detection of glaucoma and other disorders of the eye. Is the patient up to date with their annual eye exam?  Yes  Who is the provider or what is the name of the office in which the patient attends annual eye exams? [facility], Dr. Wallace Going. Cataract operation planned later in the season.   Dental Screening: Recommended annual dental exams for proper oral hygiene.  Community Resource Referral / Chronic Care Management: CRR required this visit?  No   CCM required this visit?  No      Plan:   Keep all routine maintenance appointments.   Next scheduled lab fasting 12/26/20 @ 8:15  Follow up  12/30/20 @ 9:30  I have personally reviewed and noted the following in the patient's chart:   . Medical and social history . Use of alcohol, tobacco or illicit drugs  . Current medications and supplements . Functional ability and status . Nutritional  status . Physical activity . Advanced directives . List of other physicians . Hospitalizations, surgeries, and ER visits in previous 12 months . Vitals . Screenings to include cognitive, depression, and falls . Referrals and appointments  In addition, I have reviewed and discussed with patient certain preventive protocols, quality metrics, and best practice recommendations. A written personalized care plan for preventive services as well as general preventive health recommendations were provided to patient via mychart.     Varney Biles, LPN   [id number]

## 2020-12-10 NOTE — Patient Instructions (Addendum)
[name] , Thank you for taking time to come for your Medicare Wellness Visit. I appreciate your ongoing commitment to your health goals. Please review the following plan we discussed and let me know if I can assist you in the future.   These are the goals we discussed: Goals      Patient Stated     Follow up with pcp as needed (pt-stated)       This is a list of the screening recommended for you and due dates:  Health Maintenance  Topic Date Due   Mammogram  01/10/2021   Colon Cancer Screening  12/10/2021   Tetanus Vaccine  01/11/2023   Flu Shot  Completed   DEXA scan (bone density measurement)  Completed   COVID-19 Vaccine  Completed    Hepatitis C: One time screening is recommended by Center for Disease Control  (CDC) for  adults born from [birth year] through 1965.   Completed   Pneumonia vaccines  Completed    Immunizations Immunization History  Administered Date(s) Administered   Influenza Split 10/27/2012   Influenza, High Dose Seasonal PF 09/30/2016, 10/04/2018   Influenza,inj,Quad PF,6+ Mos 08/16/2014, 08/20/2015   Influenza,inj,quad, With Preservative 10/20/2017, 10/17/2019   Influenza-Unspecified 09/25/2020   PFIZER SARS-COV-2 Vaccination 02/01/2020, 02/22/2020, 09/25/2020   Pneumococcal Conjugate-13 10/17/2019   Pneumococcal Polysaccharide-23 06/05/2013   Tdap 01/11/2013   Zoster 06/04/2011   Keep all routine maintenance appointments.   Next scheduled lab fasting 12/26/20 @ 8:15  Follow up  12/30/20 @ 9:30  Advanced directives: completed, on file  Conditions/risks identified: none new  Follow up in one year for your annual wellness visit.   Preventive Care 74 Years and Older, Female Preventive care refers to lifestyle choices and visits with your health care provider that can promote health and wellness. What does preventive care include?  A yearly physical exam. This is also called an annual well check.  Dental exams once or twice a  year.  Routine eye exams. Ask your health care provider how often you should have your eyes checked.  Personal lifestyle choices, including:  Daily care of your teeth and gums.  Regular physical activity.  Eating a healthy diet.  Avoiding tobacco and drug use.  Limiting alcohol use.  Practicing safe sex.  Taking low-dose aspirin every day.  Taking vitamin and mineral supplements as recommended by your health care provider. What happens during an annual well check? The services and screenings done by your health care provider during your annual well check will depend on your age, overall health, lifestyle risk factors, and family history of disease. Counseling  Your health care provider may ask you questions about your:  Alcohol use.  Tobacco use.  Drug use.  Emotional well-being.  Home and relationship well-being.  Sexual activity.  Eating habits.  History of falls.  Memory and ability to understand (cognition).  Work and work Statistician.  Reproductive health. Screening  You may have the following tests or measurements:  Height, weight, and BMI.  Blood pressure.  Lipid and cholesterol levels. These may be checked every 5 years, or more frequently if you are over 32 years old.  Skin check.  Lung cancer screening. You may have this screening every year starting at age 89 if you have a 30-pack-year history of smoking and currently smoke or have quit within the past 15 years.  Fecal occult blood test (FOBT) of the stool. You may have this test every year starting at age 59.  Flexible sigmoidoscopy  or colonoscopy. You may have a sigmoidoscopy every 5 years or a colonoscopy every 10 years starting at age 77.  Hepatitis C blood test.  Hepatitis B blood test.  Sexually transmitted disease (STD) testing.  Diabetes screening. This is done by checking your blood sugar (glucose) after you have not eaten for a while (fasting). You may have this done every 1-3  years.  Bone density scan. This is done to screen for osteoporosis. You may have this done starting at age 12.  Mammogram. This may be done every 1-2 years. Talk to your health care provider about how often you should have regular mammograms. Talk with your health care provider about your test results, treatment options, and if necessary, the need for more tests. Vaccines  Your health care provider may recommend certain vaccines, such as:  Influenza vaccine. This is recommended every year.  Tetanus, diphtheria, and acellular pertussis (Tdap, Td) vaccine. You may need a Td booster every 10 years.  Zoster vaccine. You may need this after age 58.  Pneumococcal 13-valent conjugate (PCV13) vaccine. One dose is recommended after age 62.  Pneumococcal polysaccharide (PPSV23) vaccine. One dose is recommended after age 54. Talk to your health care provider about which screenings and vaccines you need and how often you need them. This information is not intended to replace advice given to you by your health care provider. Make sure you discuss any questions you have with your health care provider. Document Released: 01/03/2016 Document Revised: 08/26/2016 Document Reviewed: 10/08/2015 Elsevier Interactive Patient Education  2017 [employer].  Fall Prevention in the Home Falls can cause injuries. They can happen to people of all ages. There are many things you can do to make your home safe and to help prevent falls. What can I do on the outside of my home?  Regularly fix the edges of walkways and driveways and fix any cracks.  Remove anything that might make you trip as you walk through a door, such as a raised step or threshold.  Trim any bushes or trees on the path to your home.  Use bright outdoor lighting.  Clear any walking paths of anything that might make someone trip, such as rocks or tools.  Regularly check to see if handrails are loose or broken. Make sure that both sides of any  steps have handrails.  Any raised decks and porches should have guardrails on the edges.  Have any leaves, snow, or ice cleared regularly.  Use sand or salt on walking paths during winter.  Clean up any spills in your garage right away. This includes oil or grease spills. What can I do in the bathroom?  Use night lights.  Install grab bars by the toilet and in the tub and shower. Do not use towel bars as grab bars.  Use non-skid mats or decals in the tub or shower.  If you need to sit down in the shower, use a plastic, non-slip stool.  Keep the floor dry. Clean up any water that spills on the floor as soon as it happens.  Remove soap buildup in the tub or shower regularly.  Attach bath mats securely with double-sided non-slip rug tape.  Do not have throw rugs and other things on the floor that can make you trip. What can I do in the bedroom?  Use night lights.  Make sure that you have a light by your bed that is easy to reach.  Do not use any sheets or blankets that are  too big for your bed. They should not hang down onto the floor.  Have a firm chair that has side arms. You can use this for support while you get dressed.  Do not have throw rugs and other things on the floor that can make you trip. What can I do in the kitchen?  Clean up any spills right away.  Avoid walking on wet floors.  Keep items that you use a lot in easy-to-reach places.  If you need to reach something above you, use a strong step stool that has a grab bar.  Keep electrical cords out of the way.  Do not use floor polish or wax that makes floors slippery. If you must use wax, use non-skid floor wax.  Do not have throw rugs and other things on the floor that can make you trip. What can I do with my stairs?  Do not leave any items on the stairs.  Make sure that there are handrails on both sides of the stairs and use them. Fix handrails that are broken or loose. Make sure that handrails are  as long as the stairways.  Check any carpeting to make sure that it is firmly attached to the stairs. Fix any carpet that is loose or worn.  Avoid having throw rugs at the top or bottom of the stairs. If you do have throw rugs, attach them to the floor with carpet tape.  Make sure that you have a light switch at the top of the stairs and the bottom of the stairs. If you do not have them, ask someone to add them for you. What else can I do to help prevent falls?  Wear shoes that:  Do not have high heels.  Have rubber bottoms.  Are comfortable and fit you well.  Are closed at the toe. Do not wear sandals.  If you use a stepladder:  Make sure that it is fully opened. Do not climb a closed stepladder.  Make sure that both sides of the stepladder are locked into place.  Ask someone to hold it for you, if possible.  Clearly mark and make sure that you can see:  Any grab bars or handrails.  First and last steps.  Where the edge of each step is.  Use tools that help you move around (mobility aids) if they are needed. These include:  Canes.  Walkers.  Scooters.  Crutches.  Turn on the lights when you go into a dark area. Replace any light bulbs as soon as they burn out.  Set up your furniture so you have a clear path. Avoid moving your furniture around.  If any of your floors are uneven, fix them.  If there are any pets around you, be aware of where they are.  Review your medicines with your doctor. Some medicines can make you feel dizzy. This can increase your chance of falling. Ask your doctor what other things that you can do to help prevent falls. This information is not intended to replace advice given to you by your health care provider. Make sure you discuss any questions you have with your health care provider. Document Released: 10/03/2009 Document Revised: 05/14/2016 Document Reviewed: 01/11/2015 Elsevier Interactive Patient Education  2017 [employer].

## 2020-12-18 ENCOUNTER — Encounter: Payer: Self-pay | Admitting: Internal Medicine

## 2020-12-18 ENCOUNTER — Telehealth (INDEPENDENT_AMBULATORY_CARE_PROVIDER_SITE_OTHER): Payer: Medicare Other | Admitting: Internal Medicine

## 2020-12-18 ENCOUNTER — Other Ambulatory Visit: Payer: Self-pay

## 2020-12-18 ENCOUNTER — Other Ambulatory Visit (INDEPENDENT_AMBULATORY_CARE_PROVIDER_SITE_OTHER): Payer: Medicare Other

## 2020-12-18 ENCOUNTER — Other Ambulatory Visit
Admission: RE | Admit: 2020-12-18 | Discharge: 2020-12-18 | Disposition: A | Discharge status: Home / Self Care | Payer: Medicare Other | Source: Ambulatory Visit | Attending: Internal Medicine | Admitting: Internal Medicine

## 2020-12-18 VITALS — BP 122/64 | HR 104 | Ht 62.0 in | Wt 124.0 lb

## 2020-12-18 DIAGNOSIS — N3000 Acute cystitis without hematuria: Secondary | ICD-10-CM

## 2020-12-18 DIAGNOSIS — R6883 Chills (without fever): Secondary | ICD-10-CM

## 2020-12-18 DIAGNOSIS — R509 Fever, unspecified: Secondary | ICD-10-CM

## 2020-12-18 DIAGNOSIS — M545 Low back pain, unspecified: Secondary | ICD-10-CM | POA: Diagnosis not present

## 2020-12-18 LAB — URINALYSIS, ROUTINE W REFLEX MICROSCOPIC
Bilirubin Urine: NEGATIVE
Glucose, UA: NEGATIVE mg/dL
Ketones, ur: NEGATIVE mg/dL
Nitrite: NEGATIVE
Protein, ur: NEGATIVE mg/dL
Specific Gravity, Urine: 1.003 — ABNORMAL LOW (ref 1.005–1.030)
Squamous Epithelial / HPF: NONE SEEN (ref 0–5)
pH: 7 (ref 5.0–8.0)

## 2020-12-18 LAB — POCT INFLUENZA A/B
Influenza A, POC: NEGATIVE
Influenza B, POC: NEGATIVE

## 2020-12-18 NOTE — Progress Notes (Signed)
Patient was tested yesterday and this was negative.   Patient was having cold, chills, and fever. Came on after leaving the beauty shop. Onset yesterday.   No one around the Patient having the same symptoms. States is around grandchildren but they are home schooled.   Patient states she is also having low back pain and with urination. Also burning with urination.

## 2020-12-18 NOTE — Progress Notes (Signed)
Telephone Note  I connected with Jamie Elliott  on 12/18/20 at  2:30 PM EST by a telephone and verified that I am speaking with the correct person using two identifiers.  Location patient: home, [place] Location provider:work or home office Persons participating in the virtual visit: patient, provider  I discussed the limitations of evaluation and management by telemedicine and the availability of in person appointments. The patient expressed understanding and agreed to proceed.   HPI: Sick visit with low back pain and b/l arm pain x 2 days and cold and having chills which now resolved and temp today 99.29F and burning with urination with h/o URI. She had shaking chills yesterday after leaving the salon urine culture urology 11/11/20 negative. She is having fatigue as well covid test 12/17/20 negative    ROS: See pertinent positives and negatives per HPI.  Past Medical History:  Diagnosis Date  . Environmental allergies   . Hypercholesterolemia   . Hypertension   . Migraines     Past Surgical History:  Procedure Laterality Date  . ABDOMINAL HYSTERECTOMY  2000  . APPENDECTOMY  2000  . NOSE SURGERY  1967  . SEPTOPLASTY    . SHOULDER ARTHROSCOPY WITH OPEN ROTATOR CUFF REPAIR Right 12/05/2015   Procedure: SHOULDER ARTHROSCOPY WITH MINI OPEN ROTATOR CUFF REPAIR. DISTAL CLAVICLE EXCISION;  Surgeon: Thornton Park, MD;  Location: [facility] ORS;  Service: Orthopedics;  Laterality: Right;     Current Outpatient Medications:  .  acetaminophen (TYLENOL) 500 MG tablet, Take 500 mg by mouth every 6 (six) hours as needed., Disp: , Rfl:  .  amLODipine (NORVASC) 5 MG tablet, Take 1 tablet (5 mg total) by mouth daily., Disp: 30 tablet, Rfl: 0 .  Ascorbic Acid (VITAMIN C PO), Take 1,000 mg by mouth daily., Disp: , Rfl:  .  aspirin 325 MG EC tablet, Take 325 mg by mouth daily., Disp: , Rfl:  .  atorvastatin (LIPITOR) 40 MG tablet, Take 1 tablet (40 mg total) by mouth daily., Disp: 30 tablet, Rfl: 0 .   cetirizine (ZYRTEC) 10 MG tablet, Take 1 tablet (10 mg total) by mouth daily., Disp: 30 tablet, Rfl: 11 .  CRANBERRY EXTRACT PO, Take 1 capsule by mouth 2 (two) times daily., Disp: , Rfl:  .  D-Mannose 500 MG CAPS, Take 500 mg by mouth in the morning and at bedtime., Disp: , Rfl:  .  estradiol (ESTRACE VAGINAL) 0.1 MG/GM vaginal cream, Apply 0.5mg  (pea-sized amount)  just inside the vaginal introitus with a finger-tip on Monday, Wednesday and Friday nights., Disp: 30 g, Rfl: 12 .  hydrochlorothiazide (HYDRODIURIL) 12.5 MG tablet, TAKE ONE TABLET DAILY., Disp: 90 tablet, Rfl: 0 .  losartan (COZAAR) 100 MG tablet, Take 1 tablet by mouth daily., Disp: 90 tablet, Rfl: 0 .  Probiotic Product (ALIGN PO), Take by mouth as needed., Disp: , Rfl:  .  triamcinolone (NASACORT) 55 MCG/ACT nasal inhaler, Place 2 sprays into the nose as needed., Disp: 1 Inhaler, Rfl: 5  EXAM:  VITALS per patient if applicable:  GENERAL: alert, oriented, appears well and in no acute distress  PSYCH/NEURO: pleasant and cooperative, no obvious depression or anxiety, speech and thought processing grossly intact  ASSESSMENT AND PLAN:  Discussed the following assessment and plan:  Acute cystitis without hematuria - Plan: Urine Culture, Urinalysis, Routine w reflex microscopic, POCT Influenza A/B  Fever, chills - Plan: POCT Influenza A/B, check urine  covid negative 12/17/20  Prn Tylenol feeling better today but pt wants to make  sure no UTI or flu No sick exp she knows of   Low back pain prior ab xray with arthritis noted low back this could be etiology too  Prn Tylenol  -we discussed possible serious and likely etiologies, options for evaluation and workup, limitations of telemedicine visit vs in person visit, treatment, treatment risks and precautions.  I discussed the assessment and treatment plan with the patient. The patient was provided an opportunity to ask questions and all were answered. The patient agreed with  the plan and demonstrated an understanding of the instructions.    Time 20 min Bevelyn Buckles, MD

## 2020-12-19 ENCOUNTER — Other Ambulatory Visit: Payer: Medicare Other

## 2020-12-19 ENCOUNTER — Other Ambulatory Visit: Payer: Self-pay | Admitting: Internal Medicine

## 2020-12-19 DIAGNOSIS — N3000 Acute cystitis without hematuria: Secondary | ICD-10-CM

## 2020-12-19 DIAGNOSIS — B3731 Acute candidiasis of vulva and vagina: Secondary | ICD-10-CM

## 2020-12-19 MED ORDER — LEVOFLOXACIN 750 MG PO TABS: 750.0000 mg | ORAL_TABLET | Freq: Every day | ORAL | 0 refills | Status: DC

## 2020-12-19 MED ORDER — FLUCONAZOLE 150 MG PO TABS
150.0000 mg | ORAL_TABLET | Freq: Once | ORAL | 0 refills | Status: AC
Start: 2020-12-19 — End: 2020-12-19

## 2020-12-19 NOTE — Telephone Encounter (Signed)
-----   Message from Bevelyn Buckles, MD sent at 12/18/2020  4:02 PM EST ----- Your flu test was negative  Urine cultures please do at [facility] and they are pending

## 2020-12-19 NOTE — Telephone Encounter (Signed)
-----   Message from Bevelyn Buckles, MD sent at 12/19/2020  9:24 AM EST ----- Likely a UTI causing fever  We will prescribe antibiotics to your pharmacy now  Culture pending but if needed will change antibiotics based on culture results

## 2020-12-19 NOTE — Progress Notes (Signed)
Agree with holding on coming in for labs and can change visit to virtual visit.  See note.

## 2020-12-21 LAB — URINE CULTURE: Culture: >=100000 — AB

## 2020-12-23 ENCOUNTER — Telehealth: Payer: Self-pay

## 2020-12-23 NOTE — Telephone Encounter (Signed)
Patient is taking the antibiotic and wanted to know she can use AZO. Advised that she can use AZO if helpful. She is not going to take any close to time for her to see urology. Will call us if she needs anything.

## 2020-12-23 NOTE — Telephone Encounter (Signed)
Pt states that she has been taking meds for UTI and she is still having symptoms. She wants to know if she needs to take another antibiotic or if she can take AZO to relieve symptoms. She has an appt with urology on Friday and is trying to hold out but needs relief.

## 2020-12-24 ENCOUNTER — Other Ambulatory Visit: Payer: Self-pay | Admitting: Internal Medicine

## 2020-12-25 ENCOUNTER — Telehealth: Payer: Self-pay | Admitting: Internal Medicine

## 2020-12-25 NOTE — Telephone Encounter (Signed)
Called Patient back and went over result notes with her

## 2020-12-25 NOTE — Telephone Encounter (Signed)
Patient stated someone from Dr. Shauna Hugh office called. No phone note.

## 2020-12-26 ENCOUNTER — Other Ambulatory Visit: Payer: Medicare Other

## 2020-12-26 NOTE — Progress Notes (Signed)
12/27/2020 10:17 AM   Jamie Elliott 12-23-1945 [id number]  Referring provider: Einar Elliott, [address] [place],  [place] [address]  Chief Complaint  Patient presents with  . Recurrent UTI    Urological history 1. rUTI's Risk factors: age, vaginal atrophy and constipation KUB 12/2017 constipation.  Cystoscopy performed on 02/02/2018 by Dr. Erlene Elliott noted subtle trigonitis but no significant pathology.   + E.coli pan sensitive nitrofurantoin 100 mg x 5 days 03/05/2020 + E.coli resistant ampicillin, tetracycline nitrofurantoin 100 mg x 5 day 04/29/2020 + E.coli pan-sensitive Septra DS x 7 days 10/28/2020 + E.coli pan-sensitive Levaquin 750 mg x 5 days 12/18/2020  2. Angiomyolipoma RUS 11/13/2020 ~ 2 cm cyst in the left kidney and a ~ 1 cm hyperechoic lesion in the right likely an angiomyolipoma.  3. Vaginal atrophy - cervix, uterus and ovaries removed in 2000.  No malignancies noted - applying the vaginal estrogen cream and is applying it three nights weekly.    HPI: Jamie Elliott states that over the holidays she developed the sudden onset of fever, chills, lower back pain, dysuria and urgency.  She sought treatment with her PCP and was tested for COVID and influenza, both negative, and urine was cultured.  Her urine culture returned positive for a pansensitive E. coli and she was treated with culture appropriate antibiotics for 5 days.  She is concerned as it took longer for her symptoms to abate and she has been diligent in engaging in UTI preventative strategies.  She is taking the over-the-counter recommendations, she is not sexually active, she is applying the vaginal estrogen cream and she is not soaking in bathtubs.  Today, she states she feels better.  Patient denies any modifying or aggravating factors.  Patient denies any gross hematuria, dysuria or suprapubic/flank pain.  Patient denies any fevers, chills, nausea or vomiting.   Cath UA  today was negative.  Her PVR was 200 cc.    PMH: Past Medical History:  Diagnosis Date  . Environmental allergies   . Hypercholesterolemia   . Hypertension   . Migraines     Surgical History: Past Surgical History:  Procedure Laterality Date  . ABDOMINAL HYSTERECTOMY  2000  . APPENDECTOMY  2000  . NOSE SURGERY  1967  . SEPTOPLASTY    . SHOULDER ARTHROSCOPY WITH OPEN ROTATOR CUFF REPAIR Right 12/05/2015   Procedure: SHOULDER ARTHROSCOPY WITH MINI OPEN ROTATOR CUFF REPAIR. DISTAL CLAVICLE EXCISION;  Surgeon: Jamie Park, MD;  Location: [facility] ORS;  Service: Orthopedics;  Laterality: Right;    Home Medications:  Current Outpatient Medications on File Prior to Visit  Medication Sig Dispense Refill  . acetaminophen (TYLENOL) 500 MG tablet Take 500 mg by mouth every 6 (six) hours as needed.    . Ascorbic Acid (VITAMIN C PO) Take 1,000 mg by mouth daily.    Marland Kitchen aspirin 325 MG EC tablet Take 325 mg by mouth daily.    Marland Kitchen atorvastatin (LIPITOR) 40 MG tablet Take 1 tablet (40 mg total) by mouth daily. 90 tablet 1  . CRANBERRY EXTRACT PO Take 1 capsule by mouth 2 (two) times daily.    . D-Mannose 500 MG CAPS Take 500 mg by mouth in the morning and at bedtime.    Marland Kitchen estradiol (ESTRACE VAGINAL) 0.1 MG/GM vaginal cream Apply 0.19m (pea-sized amount)  just inside the vaginal introitus with a finger-tip on Monday, Wednesday and Friday nights. 30 g 12  . levofloxacin (LEVAQUIN) 750 MG tablet Take 1 tablet (750  mg total) by mouth daily. With food x 5-7 days 7 tablet 0  . Probiotic Product (ALIGN PO) Take by mouth as needed.    . triamcinolone (NASACORT) 55 MCG/ACT nasal inhaler Place 2 sprays into the nose as needed. 1 Inhaler 5   No current facility-administered medications on file prior to visit.    Allergies:  Allergies  Allergen Reactions  . Ciprofloxacin Other (See Comments)    Patient reports "it doesn't work for me". Last culture was sensitive, however.   . Zithromax [Azithromycin]    . Augmentin [Amoxicillin-Pot Clavulanate] Rash    Family History: Family History  Problem Relation Age of Onset  . Heart disease Mother   . Hyperlipidemia Mother   . Hypertension Mother   . Cancer Father        lung, prostate  . Hyperlipidemia Father   . Hypertension Father   . Prostate cancer Father   . Bladder Cancer Neg Hx   . Kidney cancer Neg Hx     Social History:  reports that she has never smoked. She has never used smokeless tobacco. She reports that she does not drink alcohol and does not use drugs.  ROS: Pertinent ROS in HPI  Physical Exam: BP 122/72   Pulse 90   Ht 5' 2" (1.575 m)   Wt 125 lb (56.7 kg)   LMP 11/28/1998   BMI 22.86 kg/m   Constitutional:  Well nourished. Alert and oriented, No acute distress. HEENT: [place] AT, mask in place.  Trachea midline Cardiovascular: No clubbing, cyanosis, or edema. Respiratory: Normal respiratory effort, no increased work of breathing. Neurologic: Grossly intact, no focal deficits, moving all 4 extremities. Psychiatric: Normal mood and affect.   Laboratory Data: Lab Results  Component Value Date   CREATININE 0.84 01/15/2021      Component Value Date/Time   CHOL 180 01/15/2021 0837   HDL 63.20 01/15/2021 0837   CHOLHDL 3 01/15/2021 0837   VLDL 24.8 01/15/2021 0837   LDLCALC 92 01/15/2021 0837   Lab Results  Component Value Date   AST 19 01/15/2021   Lab Results  Component Value Date   ALT 22 01/15/2021   Component     Latest Ref Rng & Units 12/18/2020  Color, Urine     YELLOW COLORLESS (A)  Appearance     CLEAR CLEAR (A)  Specific Gravity, Urine     1.005 - 1.030 1.003 (L)  pH     5.0 - 8.0 7.0  Total Protein, Urine-UPE24     Negative   Urine Glucose     Negative   Ketones, ur     NEGATIVE mg/dL NEGATIVE  Bilirubin Urine     NEGATIVE NEGATIVE  Hgb urine dipstick     NEGATIVE SMALL (A)  Urobilinogen, UA     0.0 - 1.0   Leukocytes,UA     Negative   Nitrite     NEGATIVE NEGATIVE  WBC,  UA     0 - 5 WBC/hpf 21-50  RBC / HPF     0 - 5 RBC/hpf 0-5  Squamous Epithelial / LPF     0 - 5 NONE SEEN  Ca Oxalate Crys, UA     None   Glucose, UA     NEGATIVE mg/dL NEGATIVE  Protein     NEGATIVE mg/dL NEGATIVE  Leukocytes,Ua     NEGATIVE SMALL (A)  Bacteria, UA     NONE SEEN RARE (A)  WBC Clumps  Specific Gravity, UA     1.005 - 1.030   pH, UA     5.0 - 7.5   Color, UA     Yellow   Appearance Ur     Clear   Protein,UA     Negative/Trace   Ketones, UA     Negative   RBC, UA     Negative   Bilirubin, UA     Negative   Urobilinogen, Ur     0.2 - 1.0 mg/dL   Nitrite, UA     Negative   Microscopic Examination        RBC, UA     0 - 2 /hpf   Epithelial Cells (non renal)     0 - 10 /hpf   Mucus, UA     Not Estab.   RBC     0 - 2 /hpf   Renal Epithel, UA     None seen /hpf    Component     Latest Ref Rng & Units 12/18/2020          Specimen Description      URINE, RANDOM . Marland Kitchen Marland Kitchen  Special Requests      NONE . Marland Kitchen .  Culture      >=100,000 COLONIES/mL ESCHERICHIA COLI (A)  Report Status      12/21/2020 FINAL  Organism ID, Bacteria      ESCHERICHIA COLI (A)   Component     Latest Ref Rng & Units 12/27/2020  Specific Gravity, UA     1.005 - 1.030 1.015  pH, UA     5.0 - 7.5 7.0  Color, UA     Yellow Yellow  Appearance Ur     Clear Clear  Leukocytes,UA     Negative Negative  Protein,UA     Negative/Trace Negative  Glucose, UA     Negative Negative  Ketones, UA     Negative Negative  RBC, UA     Negative Negative  Bilirubin, UA     Negative Negative  Urobilinogen, Ur     0.2 - 1.0 mg/dL 0.2  Nitrite, UA     Negative Negative  Microscopic Examination      See below:   Component     Latest Ref Rng & Units 12/27/2020  WBC, UA     0 - 5 /hpf 0-5  RBC     3.87 - 5.11 Mil/uL None seen  Epithelial Cells (non renal)     0 - 10 /hpf 0-10  Bacteria, UA     None seen/Few None seen  I have reviewed the labs.   Pertinent  Imaging: No imaging since last visit  In and Out Catheterization Patient is present today for a I & O catheterization due to rUTI's. Patient was cleaned and prepped in a sterile fashion with betadine . A 14 FR cath was inserted no complications were noted , 200 ml of urine return was noted, urine was yellow clear in color. A clean urine sample was collected for UA and urine culture. Bladder was drained  And catheter was removed with out difficulty.    Performed by: Myself   Assessment & Plan:    1. rUTI's - criteria for recurrent UTI has been met with 2 or Elliott infections in 6 months or 3 or greater infections in one year  - patient is drinking 64 ounces of water daily - patient is taking probiotics (yogurt, oral pills or vaginal suppositories), take cranberry pills or  drink the juice, Vitamin C 1,000 mg daily to acidify the urine and D-mannose - patient is controlling her constipation with MiraLax - she avoids soaking in tubs and is wiping front to back after urinating -As she is compliant and engaging with preventative UTI strategies and still having issues with UTIs, we will initiate 3 months of suppressive antibiotic with trimethoprim 100 mg daily -She is instructed to contact the office with any breakthrough infections -She will return in 3 months for follow-up                                            2. Vaginal atrophy - continue Estrace cream to apply nightly Monday, Wednesday and Friday nights  3. Right renal angiomyolipoma - will obtain a RUS in 6 months to evaluate stability    Return in about 3 months (around 03/27/2021) for recheck.  These notes generated with voice recognition software. I apologize for typographical errors.  Zara Council, PA-C  [facility] [address]  [place], [place] [address] [phone]

## 2020-12-27 ENCOUNTER — Other Ambulatory Visit: Payer: Self-pay

## 2020-12-27 ENCOUNTER — Ambulatory Visit (INDEPENDENT_AMBULATORY_CARE_PROVIDER_SITE_OTHER): Payer: Medicare Other | Admitting: Urology

## 2020-12-27 ENCOUNTER — Encounter: Payer: Self-pay | Admitting: Urology

## 2020-12-27 VITALS — BP 122/72 | HR 90 | Ht 62.0 in | Wt 125.0 lb

## 2020-12-27 DIAGNOSIS — N390 Urinary tract infection, site not specified: Secondary | ICD-10-CM | POA: Diagnosis not present

## 2020-12-27 LAB — URINALYSIS, COMPLETE
Bilirubin, UA: NEGATIVE
Glucose, UA: NEGATIVE
Ketones, UA: NEGATIVE
Leukocytes,UA: NEGATIVE
Nitrite, UA: NEGATIVE
Protein,UA: NEGATIVE
RBC, UA: NEGATIVE
Specific Gravity, UA: 1.015 (ref 1.005–1.030)
Urobilinogen, Ur: 0.2 mg/dL (ref 0.2–1.0)
pH, UA: 7 (ref 5.0–7.5)

## 2020-12-27 LAB — MICROSCOPIC EXAMINATION
Bacteria, UA: NONE SEEN
RBC, Urine: NONE SEEN /hpf (ref 0–2)

## 2020-12-27 MED ORDER — TRIMETHOPRIM 100 MG PO TABS: 100.0000 mg | ORAL_TABLET | Freq: Every day | ORAL | 0 refills | Status: DC

## 2020-12-30 ENCOUNTER — Encounter: Payer: Self-pay | Admitting: Internal Medicine

## 2020-12-30 ENCOUNTER — Other Ambulatory Visit: Payer: Self-pay | Admitting: Internal Medicine

## 2020-12-30 ENCOUNTER — Telehealth (INDEPENDENT_AMBULATORY_CARE_PROVIDER_SITE_OTHER): Payer: Medicare Other | Admitting: Internal Medicine

## 2020-12-30 DIAGNOSIS — R5383 Other fatigue: Secondary | ICD-10-CM

## 2020-12-30 DIAGNOSIS — D219 Benign neoplasm of connective and other soft tissue, unspecified: Secondary | ICD-10-CM | POA: Diagnosis not present

## 2020-12-30 DIAGNOSIS — E7800 Pure hypercholesterolemia, unspecified: Secondary | ICD-10-CM

## 2020-12-30 DIAGNOSIS — N390 Urinary tract infection, site not specified: Secondary | ICD-10-CM | POA: Diagnosis not present

## 2020-12-30 DIAGNOSIS — I10 Essential (primary) hypertension: Secondary | ICD-10-CM

## 2020-12-30 DIAGNOSIS — F439 Reaction to severe stress, unspecified: Secondary | ICD-10-CM | POA: Diagnosis not present

## 2020-12-30 NOTE — Progress Notes (Signed)
Patient ID: Jamie Elliott, female   DOB: 1946/01/05, 75 y.o.   MRN: [id number]   Virtual Visit via video Note  This visit type was conducted due to national recommendations for restrictions regarding the COVID-19 pandemic (e.g. social distancing).  This format is felt to be most appropriate for this patient at this time.  All issues noted in this document were discussed and addressed.  No physical exam was performed (except for noted visual exam findings with Video Visits).   I connected with Jamie Elliott by a video enabled telemedicine application and verified that I am speaking with the correct person using two identifiers. Location patient: home Location provider: work  Persons participating in the virtual visit: patient, provider  The limitations, risks, security and privacy concerns of performing an evaluation and management service by video and the availability of in person appointments have been discussed. it has also been discussed with the patient that there may be a patient responsible charge related to this service. The patient expressed understanding and agreed to proceed.   Reason for visit: scheduled follow up.    HPI: Scheduled follow up.  Follow up regarding a recent UTI.  Also f/u regarding her blood pressure.  Saw urology 12/27/20 for recurring UTIs.  Recommended trimethoprim - suppressive abx.  No urinary symptoms now.  Some fatigue.  No chest pain or sob reported.  No abdominal pain or bowel change reported.  Blood pressures averaging 120s/58-70.  Handling stress.    ROS: See pertinent positives and negatives per HPI.  Past Medical History:  Diagnosis Date  . Environmental allergies   . Hypercholesterolemia   . Hypertension   . Migraines     Past Surgical History:  Procedure Laterality Date  . ABDOMINAL HYSTERECTOMY  2000  . APPENDECTOMY  2000  . NOSE SURGERY  1967  . SEPTOPLASTY    . SHOULDER ARTHROSCOPY WITH OPEN ROTATOR CUFF REPAIR Right 12/05/2015    Procedure: SHOULDER ARTHROSCOPY WITH MINI OPEN ROTATOR CUFF REPAIR. DISTAL CLAVICLE EXCISION;  Surgeon: Thornton Park, MD;  Location: [facility] ORS;  Service: Orthopedics;  Laterality: Right;    Family History  Problem Relation Age of Onset  . Heart disease Mother   . Hyperlipidemia Mother   . Hypertension Mother   . Cancer Father        lung, prostate  . Hyperlipidemia Father   . Hypertension Father   . Prostate cancer Father   . Bladder Cancer Neg Hx   . Kidney cancer Neg Hx     SOCIAL HX: reviewed.    Current Outpatient Medications:  .  acetaminophen (TYLENOL) 500 MG tablet, Take 500 mg by mouth every 6 (six) hours as needed., Disp: , Rfl:  .  amLODipine (NORVASC) 5 MG tablet, Take 1 tablet (5 mg total) by mouth daily., Disp: 30 tablet, Rfl: 0 .  Ascorbic Acid (VITAMIN C PO), Take 1,000 mg by mouth daily., Disp: , Rfl:  .  aspirin 325 MG EC tablet, Take 325 mg by mouth daily., Disp: , Rfl:  .  atorvastatin (LIPITOR) 40 MG tablet, Take 1 tablet (40 mg total) by mouth daily., Disp: 90 tablet, Rfl: 1 .  cetirizine (ZYRTEC) 10 MG tablet, Take 1 tablet (10 mg total) by mouth daily., Disp: 30 tablet, Rfl: 11 .  CRANBERRY EXTRACT PO, Take 1 capsule by mouth 2 (two) times daily., Disp: , Rfl:  .  D-Mannose 500 MG CAPS, Take 500 mg by mouth in the morning and at bedtime., Disp: , Rfl:  .  estradiol (ESTRACE VAGINAL) 0.1 MG/GM vaginal cream, Apply 0.5mg  (pea-sized amount)  just inside the vaginal introitus with a finger-tip on Monday, Wednesday and Friday nights., Disp: 30 g, Rfl: 12 .  hydrochlorothiazide (HYDRODIURIL) 12.5 MG tablet, TAKE ONE TABLET DAILY., Disp: 90 tablet, Rfl: 0 .  levofloxacin (LEVAQUIN) 750 MG tablet, Take 1 tablet (750 mg total) by mouth daily. With food x 5-7 days, Disp: 7 tablet, Rfl: 0 .  losartan (COZAAR) 100 MG tablet, Take 1 tablet by mouth daily., Disp: 90 tablet, Rfl: 0 .  Probiotic Product (ALIGN PO), Take by mouth as needed., Disp: , Rfl:  .  triamcinolone  (NASACORT) 55 MCG/ACT nasal inhaler, Place 2 sprays into the nose as needed., Disp: 1 Inhaler, Rfl: 5 .  trimethoprim (TRIMPEX) 100 MG tablet, Take 1 tablet (100 mg total) by mouth daily., Disp: 90 tablet, Rfl: 0  EXAM:  GENERAL: alert, oriented, appears well and in no acute distress  HEENT: atraumatic, conjunttiva clear, no obvious abnormalities on inspection of external nose and ears  NECK: normal movements of the head and neck  LUNGS: on inspection no signs of respiratory distress, breathing rate appears normal, no obvious gross SOB, gasping or wheezing  CV: no obvious cyanosis  PSYCH/NEURO: pleasant and cooperative, no obvious depression or anxiety, speech and thought processing grossly intact  ASSESSMENT AND PLAN:  Discussed the following assessment and plan:  Problem List Items Addressed This Visit    Angioleiomyoma    Saw urology 11/18/20.  Recommended f/u ultrasound in 6 months.        Fatigue    Reported some fatigue.  Urinary symptoms resolved.  Check routine labs including cbc, metabolic panel, tsh and vitamin D.  Follow.       Hypercholesterolemia    On lipitor.  Low cholesterol diet and exercise. Follow lipid panel and liver function tests.        Hypertension    Blood pressure doing well as outlined.  Continue amlodipine and hctz.  Follow pressures.  Follow metabolic panel.       Recurrent UTI    Seeing urology.  Just evaluated.  On suppressive abx.  Follow.        Stress    Overall appears to be handling stress well.  Follow.           I discussed the assessment and treatment plan with the patient. The patient was provided an opportunity to ask questions and all were answered. The patient agreed with the plan and demonstrated an understanding of the instructions.   The patient was advised to call back or seek an in-person evaluation if the symptoms worsen or if the condition fails to improve as anticipated.   Einar Pheasant, MD

## 2020-12-31 ENCOUNTER — Other Ambulatory Visit: Payer: Self-pay | Admitting: Internal Medicine

## 2021-01-01 ENCOUNTER — Other Ambulatory Visit: Payer: Self-pay | Admitting: Internal Medicine

## 2021-01-05 ENCOUNTER — Encounter: Payer: Self-pay | Admitting: Internal Medicine

## 2021-01-05 ENCOUNTER — Telehealth: Payer: Self-pay | Admitting: Internal Medicine

## 2021-01-05 DIAGNOSIS — R5383 Other fatigue: Secondary | ICD-10-CM | POA: Insufficient documentation

## 2021-01-05 NOTE — Assessment & Plan Note (Signed)
Overall appears to be handling stress well.  Follow.  

## 2021-01-05 NOTE — Assessment & Plan Note (Signed)
Blood pressure doing well as outlined.  Continue amlodipine and hctz.  Follow pressures.  Follow metabolic panel.

## 2021-01-05 NOTE — Assessment & Plan Note (Signed)
Seeing urology.  Just evaluated.  On suppressive abx.  Follow.

## 2021-01-05 NOTE — Assessment & Plan Note (Signed)
Saw urology 11/18/20.  Recommended f/u ultrasound in 6 months.

## 2021-01-05 NOTE — Assessment & Plan Note (Signed)
On lipitor.  Low cholesterol diet and exercise.  Follow lipid panel and liver function tests.   

## 2021-01-05 NOTE — Assessment & Plan Note (Signed)
Reported some fatigue.  Urinary symptoms resolved.  Check routine labs including cbc, metabolic panel, tsh and vitamin D.  Follow.

## 2021-01-05 NOTE — Telephone Encounter (Signed)
Need colonoscopy report - from Dr Alice Reichert [facility].  Thanks.

## 2021-01-06 NOTE — Telephone Encounter (Signed)
Requested report via efax.

## 2021-01-08 ENCOUNTER — Telehealth: Payer: Self-pay

## 2021-01-08 ENCOUNTER — Other Ambulatory Visit: Payer: Self-pay

## 2021-01-08 DIAGNOSIS — Z1231 Encounter for screening mammogram for malignant neoplasm of breast: Secondary | ICD-10-CM

## 2021-01-08 NOTE — Telephone Encounter (Signed)
Pt called and wants to know when someone will call her about her referral for Mammogram. Please advise

## 2021-01-08 NOTE — Telephone Encounter (Signed)
Attempted to call [place]. Unable to reach.

## 2021-01-10 ENCOUNTER — Other Ambulatory Visit: Payer: Medicare Other

## 2021-01-14 NOTE — Telephone Encounter (Signed)
Mammogram scheduled. LM with appt date and time on patients phone

## 2021-01-15 ENCOUNTER — Other Ambulatory Visit (INDEPENDENT_AMBULATORY_CARE_PROVIDER_SITE_OTHER): Payer: Medicare Other

## 2021-01-15 ENCOUNTER — Other Ambulatory Visit: Payer: Self-pay

## 2021-01-15 DIAGNOSIS — M810 Age-related osteoporosis without current pathological fracture: Secondary | ICD-10-CM | POA: Diagnosis not present

## 2021-01-15 DIAGNOSIS — E7800 Pure hypercholesterolemia, unspecified: Secondary | ICD-10-CM | POA: Diagnosis not present

## 2021-01-15 DIAGNOSIS — I10 Essential (primary) hypertension: Secondary | ICD-10-CM | POA: Diagnosis not present

## 2021-01-15 LAB — CBC WITH DIFFERENTIAL/PLATELET
Basophils Absolute: 0 10*3/uL (ref 0.0–0.1)
Basophils Relative: 0.5 % (ref 0.0–3.0)
Eosinophils Absolute: 0.3 10*3/uL (ref 0.0–0.7)
Eosinophils Relative: 4.1 % (ref 0.0–5.0)
HCT: 39.3 % (ref 36.0–46.0)
Hemoglobin: 13.1 g/dL (ref 12.0–15.0)
Lymphocytes Relative: 23.7 % (ref 12.0–46.0)
Lymphs Abs: 1.5 10*3/uL (ref 0.7–4.0)
MCHC: 33.3 g/dL (ref 30.0–36.0)
MCV: 92.1 fl (ref 78.0–100.0)
Monocytes Absolute: 0.4 10*3/uL (ref 0.1–1.0)
Monocytes Relative: 6.2 % (ref 3.0–12.0)
Neutro Abs: 4.1 10*3/uL (ref 1.4–7.7)
Neutrophils Relative %: 65.5 % (ref 43.0–77.0)
Platelets: 225 10*3/uL (ref 150.0–400.0)
RBC: 4.27 Mil/uL (ref 3.87–5.11)
RDW: 13.5 % (ref 11.5–15.5)
WBC: 6.2 10*3/uL (ref 4.0–10.5)

## 2021-01-15 LAB — BASIC METABOLIC PANEL
BUN: 18 mg/dL (ref 6–23)
CO2: 29 mEq/L (ref 19–32)
Calcium: 10.2 mg/dL (ref 8.4–10.5)
Chloride: 104 mEq/L (ref 96–112)
Creatinine, Ser: 0.84 mg/dL (ref 0.40–1.20)
GFR: 68.19 mL/min (ref >60.00)
Glucose, Bld: 85 mg/dL (ref 70–99)
Potassium: 4 mEq/L (ref 3.5–5.1)
Sodium: 140 mEq/L (ref 135–145)

## 2021-01-15 LAB — LIPID PANEL
Cholesterol: 180 mg/dL (ref 0–200)
HDL: 63.2 mg/dL (ref >39.00)
LDL Cholesterol: 92 mg/dL (ref 0–99)
NonHDL: 117.29
Total CHOL/HDL Ratio: 3
Triglycerides: 124 mg/dL (ref 0.0–149.0)
VLDL: 24.8 mg/dL (ref 0.0–40.0)

## 2021-01-15 LAB — HEPATIC FUNCTION PANEL
ALT: 22 U/L (ref 0–35)
AST: 19 U/L (ref 0–37)
Albumin: 4 g/dL (ref 3.5–5.2)
Alkaline Phosphatase: 94 U/L (ref 39–117)
Bilirubin, Direct: 0.1 mg/dL (ref 0.0–0.3)
Total Bilirubin: 0.6 mg/dL (ref 0.2–1.2)
Total Protein: 6.4 g/dL (ref 6.0–8.3)

## 2021-01-15 LAB — VITAMIN D 25 HYDROXY (VIT D DEFICIENCY, FRACTURES): VITD: 22.59 ng/mL — ABNORMAL LOW (ref 30.00–100.00)

## 2021-01-15 LAB — TSH: TSH: 1.54 u[IU]/mL (ref 0.35–4.50)

## 2021-01-20 ENCOUNTER — Other Ambulatory Visit: Payer: Self-pay | Admitting: Internal Medicine

## 2021-01-27 ENCOUNTER — Other Ambulatory Visit: Payer: Self-pay | Admitting: Internal Medicine

## 2021-02-06 DIAGNOSIS — M47812 Spondylosis without myelopathy or radiculopathy, cervical region: Secondary | ICD-10-CM | POA: Diagnosis not present

## 2021-02-17 ENCOUNTER — Other Ambulatory Visit: Payer: Self-pay | Admitting: Internal Medicine

## 2021-02-19 ENCOUNTER — Ambulatory Visit
Admission: RE | Admit: 2021-02-19 | Discharge: 2021-02-19 | Disposition: A | Discharge status: Home / Self Care | Payer: Medicare Other | Source: Ambulatory Visit | Attending: Internal Medicine | Admitting: Internal Medicine

## 2021-02-19 ENCOUNTER — Inpatient Hospital Stay
Admission: RE | Admit: 2021-02-19 | Discharge: 2021-02-19 | Disposition: A | Discharge status: Home / Self Care | Payer: Self-pay | Source: Ambulatory Visit | Attending: *Deleted | Admitting: *Deleted

## 2021-02-19 ENCOUNTER — Other Ambulatory Visit: Payer: Self-pay | Admitting: *Deleted

## 2021-02-19 ENCOUNTER — Other Ambulatory Visit: Payer: Self-pay

## 2021-02-19 DIAGNOSIS — Z1231 Encounter for screening mammogram for malignant neoplasm of breast: Secondary | ICD-10-CM

## 2021-02-22 ENCOUNTER — Other Ambulatory Visit: Payer: Self-pay | Admitting: Internal Medicine

## 2021-02-22 DIAGNOSIS — R928 Other abnormal and inconclusive findings on diagnostic imaging of breast: Secondary | ICD-10-CM

## 2021-02-22 NOTE — Progress Notes (Signed)
Order placed for f/u left breast mammogram and ultrasound.   

## 2021-02-24 ENCOUNTER — Other Ambulatory Visit: Payer: Self-pay | Admitting: Internal Medicine

## 2021-02-25 DIAGNOSIS — H2513 Age-related nuclear cataract, bilateral: Secondary | ICD-10-CM | POA: Diagnosis not present

## 2021-02-25 DIAGNOSIS — H40003 Preglaucoma, unspecified, bilateral: Secondary | ICD-10-CM | POA: Diagnosis not present

## 2021-02-26 ENCOUNTER — Ambulatory Visit (INDEPENDENT_AMBULATORY_CARE_PROVIDER_SITE_OTHER): Payer: Medicare Other | Admitting: Internal Medicine

## 2021-02-26 ENCOUNTER — Other Ambulatory Visit: Payer: Self-pay

## 2021-02-26 ENCOUNTER — Encounter: Payer: Self-pay | Admitting: Internal Medicine

## 2021-02-26 DIAGNOSIS — D219 Benign neoplasm of connective and other soft tissue, unspecified: Secondary | ICD-10-CM

## 2021-02-26 DIAGNOSIS — D225 Melanocytic nevi of trunk: Secondary | ICD-10-CM | POA: Diagnosis not present

## 2021-02-26 DIAGNOSIS — I10 Essential (primary) hypertension: Secondary | ICD-10-CM

## 2021-02-26 DIAGNOSIS — M79672 Pain in left foot: Secondary | ICD-10-CM | POA: Diagnosis not present

## 2021-02-26 DIAGNOSIS — F439 Reaction to severe stress, unspecified: Secondary | ICD-10-CM | POA: Diagnosis not present

## 2021-02-26 DIAGNOSIS — D2262 Melanocytic nevi of left upper limb, including shoulder: Secondary | ICD-10-CM | POA: Diagnosis not present

## 2021-02-26 DIAGNOSIS — N390 Urinary tract infection, site not specified: Secondary | ICD-10-CM | POA: Diagnosis not present

## 2021-02-26 DIAGNOSIS — E7800 Pure hypercholesterolemia, unspecified: Secondary | ICD-10-CM

## 2021-02-26 DIAGNOSIS — D2261 Melanocytic nevi of right upper limb, including shoulder: Secondary | ICD-10-CM | POA: Diagnosis not present

## 2021-02-26 DIAGNOSIS — D2271 Melanocytic nevi of right lower limb, including hip: Secondary | ICD-10-CM | POA: Diagnosis not present

## 2021-02-26 NOTE — Progress Notes (Signed)
Patient ID: JEREMY MCLAMB, female   DOB: [birth year], 75 y.o.   MRN: [id number]   Subjective:    Patient ID: ELSI STELZER, female    DOB: 1946-08-19, 75 y.o.   MRN: [id number]  HPI This visit occurred during the SARS-CoV-2 public health emergency.  Safety protocols were in place, including screening questions prior to the visit, additional usage of staff PPE, and extensive cleaning of exam room while observing appropriate contact time as indicated for disinfecting solutions.  Patient here for a scheduled follow up.  Here to follow up regarding her blood pressure, cholesterol and recurring UTIs.  She has seen urology. Recommended trimethoprim - suppressive abx.  Also taking D Mannos - bid. Doing well. No urinary symptoms.  Tries to stay active.  No chest pain or sob reported.  Handling stress.  Seeing her grandchildren - this has helped.  On 2/14 was playing basketball with them.  Noticed after - neck strain/arm hurt.  Saw [facility] 2/17 - spondylosis of neck.  Off advil.  Exercises and ice.  Better.  She also recently turned her left foot - twisted.  The following few days, she noticed swelling and bruising of her left foot.  No pain now, but does report persistent numbness - beneath her toes/ball of foot.  She is planning for cataract surgery 04/2021.  As above, no chest pain or sob with increased activity or exertion.  No acid reflux reported.  No abdominal pain or bowel change reported.    Past Medical History:  Diagnosis Date  . Environmental allergies   . Hypercholesterolemia   . Hypertension   . Migraines    Past Surgical History:  Procedure Laterality Date  . ABDOMINAL HYSTERECTOMY  2000  . APPENDECTOMY  2000  . NOSE SURGERY  1967  . SEPTOPLASTY    . SHOULDER ARTHROSCOPY WITH OPEN ROTATOR CUFF REPAIR Right 12/05/2015   Procedure: SHOULDER ARTHROSCOPY WITH MINI OPEN ROTATOR CUFF REPAIR. DISTAL CLAVICLE EXCISION;  Surgeon: Thornton Park, MD;  Location: [facility] ORS;  Service: Orthopedics;   Laterality: Right;   Family History  Problem Relation Age of Onset  . Heart disease Mother   . Hyperlipidemia Mother   . Hypertension Mother   . Cancer Father        lung, prostate  . Hyperlipidemia Father   . Hypertension Father   . Prostate cancer Father   . Bladder Cancer Neg Hx   . Kidney cancer Neg Hx    Social History   Socioeconomic History  . Marital status: Married    Spouse name: Not on file  . Number of children: 2  . Years of education: Not on file  . Highest education level: Not on file  Occupational History  . Not on file  Tobacco Use  . Smoking status: Never Smoker  . Smokeless tobacco: Never Used  Substance and Sexual Activity  . Alcohol use: No    Alcohol/week: 0.0 standard drinks  . Drug use: No  . Sexual activity: Not Currently  Other Topics Concern  . Not on file  Social History Narrative  . Not on file   Social Determinants of Health   Financial Resource Strain: Low Risk   . Difficulty of Paying Living Expenses: Not hard at all  Food Insecurity: No Food Insecurity  . Worried About Charity fundraiser in the Last Year: Never true  . Ran Out of Food in the Last Year: Never true  Transportation Needs: No Transportation Needs  . Lack  of Transportation (Medical): No  . Lack of Transportation (Non-Medical): No  Physical Activity: Not on file  Stress: No Stress Concern Present  . Feeling of Stress : Not at all  Social Connections: Unknown  . Frequency of Communication with Friends and Family: More than three times a week  . Frequency of Social Gatherings with Friends and Family: More than three times a week  . Attends Religious Services: Not on file  . Active Member of Clubs or Organizations: Not on file  . Attends Archivist Meetings: Not on file  . Marital Status: Married    Outpatient Encounter Medications as of 02/26/2021  Medication Sig  . acetaminophen (TYLENOL) 500 MG tablet Take 500 mg by mouth every 6 (six) hours as needed.   Marland Kitchen amLODipine (NORVASC) 5 MG tablet Take 1 tablet (5 mg total) by mouth daily.  . Ascorbic Acid (VITAMIN C) 1000 MG tablet Take 1,000 mg by mouth daily.  Marland Kitchen aspirin 325 MG EC tablet Take 325 mg by mouth daily.  Marland Kitchen atorvastatin (LIPITOR) 40 MG tablet Take 1 tablet (40 mg total) by mouth daily.  . cetirizine (ZYRTEC) 10 MG tablet Take 1 tablet (10 mg total) by mouth daily.  . Cholecalciferol (VITAMIN D3) 25 MCG (1000 UT) CAPS Take 1 capsule by mouth daily.  Marland Kitchen CRANBERRY EXTRACT PO Take 1 capsule by mouth 2 (two) times daily.  . D-Mannose 500 MG CAPS Take 500 mg by mouth in the morning and at bedtime.  Marland Kitchen estradiol (ESTRACE VAGINAL) 0.1 MG/GM vaginal cream Apply 0.5mg  (pea-sized amount)  just inside the vaginal introitus with a finger-tip on Monday, Wednesday and Friday nights.  . hydrochlorothiazide (HYDRODIURIL) 12.5 MG tablet TAKE ONE TABLET DAILY.  Marland Kitchen losartan (COZAAR) 100 MG tablet Take 1 tablet by mouth daily.  . polyethylene glycol (MIRALAX / GLYCOLAX) 17 g packet Take 17 g by mouth every other day.  . Probiotic Product (ALIGN PO) Take by mouth as needed.  . triamcinolone (NASACORT) 55 MCG/ACT nasal inhaler Place 2 sprays into the nose as needed.  . trimethoprim (TRIMPEX) 100 MG tablet Take 1 tablet (100 mg total) by mouth daily.  . [DISCONTINUED] Ascorbic Acid (VITAMIN C PO) Take 1,000 mg by mouth daily.  . [DISCONTINUED] levofloxacin (LEVAQUIN) 750 MG tablet Take 1 tablet (750 mg total) by mouth daily. With food x 5-7 days   No facility-administered encounter medications on file as of 02/26/2021.    Review of Systems  Constitutional: Negative for appetite change and unexpected weight change.  HENT: Negative for congestion and sinus pressure.   Respiratory: Negative for cough, chest tightness and shortness of breath.   Cardiovascular: Negative for chest pain, palpitations and leg swelling.  Gastrointestinal: Negative for abdominal pain, diarrhea, nausea and vomiting.  Genitourinary:  Negative for difficulty urinating and dysuria.  Musculoskeletal: Negative for joint swelling and myalgias.       Numbness - beneath toes - as outlined.  Skin: Negative for color change and rash.  Neurological: Negative for dizziness, light-headedness and headaches.  Psychiatric/Behavioral: Negative for agitation and dysphoric mood.       Objective:    Physical Exam Vitals reviewed.  Constitutional:      General: She is not in acute distress.    Appearance: Normal appearance.  HENT:     Head: Normocephalic and atraumatic.     Right Ear: External ear normal.     Left Ear: External ear normal.  Eyes:     General: No scleral icterus.  Right eye: No discharge.        Left eye: No discharge.     Conjunctiva/sclera: Conjunctivae normal.  Neck:     Thyroid: No thyromegaly.  Cardiovascular:     Rate and Rhythm: Normal rate and regular rhythm.  Pulmonary:     Effort: No respiratory distress.     Breath sounds: Normal breath sounds. No wheezing.  Abdominal:     General: Bowel sounds are normal.     Palpations: Abdomen is soft.     Tenderness: There is no abdominal tenderness.  Musculoskeletal:        General: No swelling or tenderness.     Cervical back: Neck supple. No tenderness.     Comments: No pain to palpation - foot/ankle  Lymphadenopathy:     Cervical: No cervical adenopathy.  Skin:    Findings: No erythema or rash.  Neurological:     Mental Status: She is alert.  Psychiatric:        Mood and Affect: Mood normal.        Behavior: Behavior normal.     BP 126/70   Pulse 98   Temp 98 F (36.7 C) (Oral)   Resp 16   Ht 5\' 2"  (1.575 m)   Wt 122 lb 9.6 oz (55.6 kg)   LMP 11/28/1998   SpO2 99%   BMI 22.42 kg/m  Wt Readings from Last 3 Encounters:  03/04/21 122 lb 9.6 oz (55.6 kg)  02/26/21 122 lb 9.6 oz (55.6 kg)  12/30/20 124 lb (56.2 kg)     Lab Results  Component Value Date   WBC 6.2 01/15/2021   HGB 13.1 01/15/2021   HCT 39.3 01/15/2021   PLT  225.0 01/15/2021   GLUCOSE 85 01/15/2021   CHOL 180 01/15/2021   TRIG 124.0 01/15/2021   HDL 63.20 01/15/2021   LDLDIRECT 103.0 05/21/2015   LDLCALC 92 01/15/2021   ALT 22 01/15/2021   AST 19 01/15/2021   NA 140 01/15/2021   K 4.0 01/15/2021   CL 104 01/15/2021   CREATININE 0.84 01/15/2021   BUN 18 01/15/2021   CO2 29 01/15/2021   TSH 1.54 01/15/2021   INR 1.00 11/25/2015    MM 3D SCREEN BREAST BILATERAL  Result Date: 02/21/2021 CLINICAL DATA:  Screening. EXAM: DIGITAL SCREENING BILATERAL MAMMOGRAM WITH TOMOSYNTHESIS AND CAD TECHNIQUE: Bilateral screening digital craniocaudal and mediolateral oblique mammograms were obtained. Bilateral screening digital breast tomosynthesis was performed. The images were evaluated with computer-aided detection. COMPARISON:  Previous exam(s). ACR Breast Density Category c: The breast tissue is heterogeneously dense, which may obscure small masses. FINDINGS: In the left breast, possible distortion warrants further evaluation. In the right breast, no findings suspicious for malignancy. IMPRESSION: Further evaluation is suggested for possible distortion in the left breast. RECOMMENDATION: Diagnostic mammogram and possibly ultrasound of the left breast. (Code:[id number]) The patient will be contacted regarding the findings, and additional imaging will be scheduled. BI-RADS CATEGORY  0: Incomplete. Need additional imaging evaluation and/or prior mammograms for comparison. Electronically Signed   By: Lovey Newcomer M.D.   On: 02/21/2021 16:33       Assessment & Plan:   Problem List Items Addressed This Visit    Angioleiomyoma    Saw urology 10/2020.  Stable.  Recommended f/u in 6 months.       Hypercholesterolemia    On lipitor.  Low cholesterol diet and exercise.  Follow lipid panel and liver function tests.        Relevant Orders  Hepatic function panel   Lipid panel   Basic metabolic panel   Hypertension    Continue amlodipine and hctz.  Blood  pressure doing well.  Follow pressures.  Follow metabolic panel.       Left foot pain    Recent injury.  No pain now.  Numbness - appears to be c/w Morton's Neuroma.  She request referral to podiatry.  Order placed for referral.       Recurrent UTI    Seeing urology.  On suppressive abx.  Doing well.  No symptoms.  Follow.       Stress    Overall appears to be doing better.  Seeing her grandchildren.  Follow.           Einar Pheasant, MD

## 2021-02-27 ENCOUNTER — Ambulatory Visit: Payer: Medicare Other | Admitting: Internal Medicine

## 2021-03-03 ENCOUNTER — Other Ambulatory Visit: Payer: Self-pay

## 2021-03-03 ENCOUNTER — Ambulatory Visit
Admission: RE | Admit: 2021-03-03 | Discharge: 2021-03-03 | Disposition: A | Discharge status: Home / Self Care | Payer: Medicare Other | Source: Ambulatory Visit | Attending: Internal Medicine | Admitting: Internal Medicine

## 2021-03-03 DIAGNOSIS — R922 Inconclusive mammogram: Secondary | ICD-10-CM | POA: Diagnosis not present

## 2021-03-03 DIAGNOSIS — R928 Other abnormal and inconclusive findings on diagnostic imaging of breast: Secondary | ICD-10-CM | POA: Diagnosis not present

## 2021-03-03 DIAGNOSIS — N6489 Other specified disorders of breast: Secondary | ICD-10-CM | POA: Diagnosis not present

## 2021-03-04 ENCOUNTER — Encounter: Payer: Self-pay | Admitting: Internal Medicine

## 2021-03-04 ENCOUNTER — Ambulatory Visit (INDEPENDENT_AMBULATORY_CARE_PROVIDER_SITE_OTHER): Payer: Medicare Other | Admitting: Internal Medicine

## 2021-03-04 ENCOUNTER — Other Ambulatory Visit: Payer: Self-pay | Admitting: Internal Medicine

## 2021-03-04 DIAGNOSIS — M79672 Pain in left foot: Secondary | ICD-10-CM | POA: Diagnosis not present

## 2021-03-04 DIAGNOSIS — R928 Other abnormal and inconclusive findings on diagnostic imaging of breast: Secondary | ICD-10-CM

## 2021-03-04 DIAGNOSIS — I10 Essential (primary) hypertension: Secondary | ICD-10-CM

## 2021-03-04 DIAGNOSIS — R58 Hemorrhage, not elsewhere classified: Secondary | ICD-10-CM | POA: Diagnosis not present

## 2021-03-04 DIAGNOSIS — F439 Reaction to severe stress, unspecified: Secondary | ICD-10-CM

## 2021-03-04 MED ORDER — NYSTATIN 100000 UNIT/ML MT SUSP: OROMUCOSAL | 0 refills | Status: DC

## 2021-03-04 NOTE — Progress Notes (Signed)
Patient ID: Jamie Elliott, female   DOB: [birth year], 75 y.o.   MRN: [id number]   Subjective:    Patient ID: Jamie Elliott, female    DOB: June 20, 1946, 75 y.o.   MRN: [id number]  HPI This visit occurred during the SARS-CoV-2 public health emergency.  Safety protocols were in place, including screening questions prior to the visit, additional usage of staff PPE, and extensive cleaning of exam room while observing appropriate contact time as indicated for disinfecting solutions.  Patient here for work in appt.  Work in with concerns regarding her tongue.  Had an episode in January when eating - tongue started bleeding.  Felt something on her tongue.  No problems until yesterday.  Finished eating - noticed bleeding.  Applied pressure with napkins and ice.  Noticed some lumps on her tongue.  No sore throat.  No sinus congestion.  No cough or chest congestion.  No hemoptysis.  Does report persistent numbness - ball of her foot.  Podiatry referral placed.  Have discussed mammogram results.  Desires surgery referral.    Past Medical History:  Diagnosis Date  . Environmental allergies   . Hypercholesterolemia   . Hypertension   . Migraines    Past Surgical History:  Procedure Laterality Date  . ABDOMINAL HYSTERECTOMY  2000  . APPENDECTOMY  2000  . NOSE SURGERY  1967  . SEPTOPLASTY    . SHOULDER ARTHROSCOPY WITH OPEN ROTATOR CUFF REPAIR Right 12/05/2015   Procedure: SHOULDER ARTHROSCOPY WITH MINI OPEN ROTATOR CUFF REPAIR. DISTAL CLAVICLE EXCISION;  Surgeon: Thornton Park, MD;  Location: [facility] ORS;  Service: Orthopedics;  Laterality: Right;   Family History  Problem Relation Age of Onset  . Heart disease Mother   . Hyperlipidemia Mother   . Hypertension Mother   . Cancer Father        lung, prostate  . Hyperlipidemia Father   . Hypertension Father   . Prostate cancer Father   . Bladder Cancer Neg Hx   . Kidney cancer Neg Hx    Social History   Socioeconomic History  . Marital status:  Married    Spouse name: Not on file  . Number of children: 2  . Years of education: Not on file  . Highest education level: Not on file  Occupational History  . Not on file  Tobacco Use  . Smoking status: Never Smoker  . Smokeless tobacco: Never Used  Substance and Sexual Activity  . Alcohol use: No    Alcohol/week: 0.0 standard drinks  . Drug use: No  . Sexual activity: Not Currently  Other Topics Concern  . Not on file  Social History Narrative  . Not on file   Social Determinants of Health   Financial Resource Strain: Low Risk   . Difficulty of Paying Living Expenses: Not hard at all  Food Insecurity: No Food Insecurity  . Worried About Charity fundraiser in the Last Year: Never true  . Ran Out of Food in the Last Year: Never true  Transportation Needs: No Transportation Needs  . Lack of Transportation (Medical): No  . Lack of Transportation (Non-Medical): No  Physical Activity: Not on file  Stress: No Stress Concern Present  . Feeling of Stress : Not at all  Social Connections: Unknown  . Frequency of Communication with Friends and Family: More than three times a week  . Frequency of Social Gatherings with Friends and Family: More than three times a week  . Attends Religious Services: Not  on file  . Active Member of Clubs or Organizations: Not on file  . Attends Archivist Meetings: Not on file  . Marital Status: Married    Outpatient Encounter Medications as of 03/04/2021  Medication Sig  . acetaminophen (TYLENOL) 500 MG tablet Take 500 mg by mouth every 6 (six) hours as needed.  Marland Kitchen amLODipine (NORVASC) 5 MG tablet Take 1 tablet (5 mg total) by mouth daily.  . Ascorbic Acid (VITAMIN C) 1000 MG tablet Take 1,000 mg by mouth daily.  Marland Kitchen aspirin 325 MG EC tablet Take 325 mg by mouth daily.  Marland Kitchen atorvastatin (LIPITOR) 40 MG tablet Take 1 tablet (40 mg total) by mouth daily.  . cetirizine (ZYRTEC) 10 MG tablet Take 1 tablet (10 mg total) by mouth daily.  .  Cholecalciferol (VITAMIN D3) 25 MCG (1000 UT) CAPS Take 1 capsule by mouth daily.  Marland Kitchen CRANBERRY EXTRACT PO Take 1 capsule by mouth 2 (two) times daily.  . D-Mannose 500 MG CAPS Take 500 mg by mouth in the morning and at bedtime.  Marland Kitchen estradiol (ESTRACE VAGINAL) 0.1 MG/GM vaginal cream Apply 0.5mg  (pea-sized amount)  just inside the vaginal introitus with a finger-tip on Monday, Wednesday and Friday nights.  . hydrochlorothiazide (HYDRODIURIL) 12.5 MG tablet TAKE ONE TABLET DAILY.  Marland Kitchen losartan (COZAAR) 100 MG tablet Take 1 tablet by mouth daily.  Marland Kitchen nystatin (MYCOSTATIN) 100000 UNIT/ML suspension 5 mls swish and spit bid x 1 week.  . polyethylene glycol (MIRALAX / GLYCOLAX) 17 g packet Take 17 g by mouth every other day.  . Probiotic Product (ALIGN PO) Take by mouth as needed.  . triamcinolone (NASACORT) 55 MCG/ACT nasal inhaler Place 2 sprays into the nose as needed.  . trimethoprim (TRIMPEX) 100 MG tablet Take 1 tablet (100 mg total) by mouth daily.  . [DISCONTINUED] Ascorbic Acid (VITAMIN C PO) Take 1,000 mg by mouth daily.  . [DISCONTINUED] levofloxacin (LEVAQUIN) 750 MG tablet Take 1 tablet (750 mg total) by mouth daily. With food x 5-7 days   No facility-administered encounter medications on file as of 03/04/2021.    Review of Systems  Constitutional: Negative for appetite change and unexpected weight change.  HENT: Negative for congestion and sinus pressure.        Previous tongue bleeding as outlined.    Respiratory: Negative for cough, chest tightness and shortness of breath.   Cardiovascular: Negative for chest pain, palpitations and leg swelling.  Gastrointestinal: Negative for abdominal pain, diarrhea, nausea and vomiting.  Genitourinary: Negative for difficulty urinating and dysuria.  Musculoskeletal: Negative for joint swelling and myalgias.  Skin: Negative for color change and rash.  Neurological: Negative for dizziness, light-headedness and headaches.  Psychiatric/Behavioral:  Negative for agitation and dysphoric mood.       Objective:    Physical Exam Vitals reviewed.  Constitutional:      General: She is not in acute distress.    Appearance: Normal appearance.  HENT:     Head: Normocephalic and atraumatic.     Right Ear: External ear normal.     Left Ear: External ear normal.     Mouth/Throat:     Comments: No bleeding.  No increased erythema.  No ulcers.  Questionable minimal healing area - anterior tongue. No significant lesion.  Eyes:     General: No scleral icterus.       Right eye: No discharge.        Left eye: No discharge.  Neck:     Thyroid: No  thyromegaly.  Cardiovascular:     Rate and Rhythm: Normal rate and regular rhythm.  Pulmonary:     Effort: No respiratory distress.     Breath sounds: Normal breath sounds. No wheezing.  Abdominal:     General: Bowel sounds are normal.     Palpations: Abdomen is soft.     Tenderness: There is no abdominal tenderness.  Musculoskeletal:        General: No swelling or tenderness.     Cervical back: Neck supple. No tenderness.  Lymphadenopathy:     Cervical: No cervical adenopathy.  Skin:    Findings: No erythema or rash.  Neurological:     Mental Status: She is alert.  Psychiatric:        Mood and Affect: Mood normal.        Behavior: Behavior normal.     BP 122/70   Pulse 83   Temp 98.3 F (36.8 C) (Oral)   Resp 16   Wt 122 lb 9.6 oz (55.6 kg)   LMP 11/28/1998   SpO2 98%   BMI 22.42 kg/m  Wt Readings from Last 3 Encounters:  03/04/21 122 lb 9.6 oz (55.6 kg)  02/26/21 122 lb 9.6 oz (55.6 kg)  12/30/20 124 lb (56.2 kg)     Lab Results  Component Value Date   WBC 6.2 01/15/2021   HGB 13.1 01/15/2021   HCT 39.3 01/15/2021   PLT 225.0 01/15/2021   GLUCOSE 85 01/15/2021   CHOL 180 01/15/2021   TRIG 124.0 01/15/2021   HDL 63.20 01/15/2021   LDLDIRECT 103.0 05/21/2015   LDLCALC 92 01/15/2021   ALT 22 01/15/2021   AST 19 01/15/2021   NA 140 01/15/2021   K 4.0 01/15/2021    CL 104 01/15/2021   CREATININE 0.84 01/15/2021   BUN 18 01/15/2021   CO2 29 01/15/2021   TSH 1.54 01/15/2021   INR 1.00 11/25/2015    US BREAST LTD UNI LEFT INC AXILLA  Result Date: 03/03/2021 CLINICAL DATA:  Patient returns today to evaluate a possible LEFT breast distortion questioned on recent screening mammogram. EXAM: DIGITAL DIAGNOSTIC UNILATERAL LEFT MAMMOGRAM WITH TOMOSYNTHESIS AND CAD; ULTRASOUND LEFT BREAST LIMITED TECHNIQUE: Left digital diagnostic mammography and breast tomosynthesis was performed. The images were evaluated with computer-aided detection.; Targeted ultrasound examination of the left breast was performed COMPARISON:  Previous exams including recent screening mammogram dated 02/19/2021. ACR Breast Density Category c: The breast tissue is heterogeneously dense, which may obscure small masses. FINDINGS: LEFT breast diagnostic mammogram: On today's additional diagnostic views, there is a persistent possible subtle architectural distortion within the upper LEFT breast, only redemonstrated on today's MLO (MLO slice 28), less conspicuous on today's true lateral views (true lateral slice 34), located at the 11-12 o'clock axis based on tomosynthesis slice position. Targeted ultrasound is performed, evaluating the entire upper LEFT breast, showing only normal fibroglandular tissues and fat lobules throughout. There are ridges of normal dense fibroglandular tissue at the 11 o'clock and 1 o'clock axes, possible correlate for the mammographic finding. IMPRESSION: Possible subtle architectural distortion within the upper LEFT breast, only convincingly redemonstrated on today's MLO view, without a corresponding abnormality on ultrasound. This may merely represent superimposition of normal dense fibroglandular tissues. Attempt at stereotactic biopsy is recommended. RECOMMENDATION: 1. Stereotactic biopsy, with 3D tomosynthesis guidance, for the possible subtle architectural distortion in the  upper LEFT breast. 2. If a suitable target cannot be reproduced on the day of biopsy, recommend six-month follow-up LEFT breast diagnostic mammogram per  protocol. Patient is aware of this possibility Ordering physician will be contacted and stereotactic biopsy will be scheduled at patient's convenience. I have discussed the findings and recommendations with the patient. If applicable, a reminder letter will be sent to the patient regarding the next appointment. BI-RADS CATEGORY  4: Suspicious. Electronically Signed   By: Franki Cabot M.D.   On: 03/03/2021 14:43   MM DIAG BREAST TOMO UNI LEFT  Result Date: 03/03/2021 CLINICAL DATA:  Patient returns today to evaluate a possible LEFT breast distortion questioned on recent screening mammogram. EXAM: DIGITAL DIAGNOSTIC UNILATERAL LEFT MAMMOGRAM WITH TOMOSYNTHESIS AND CAD; ULTRASOUND LEFT BREAST LIMITED TECHNIQUE: Left digital diagnostic mammography and breast tomosynthesis was performed. The images were evaluated with computer-aided detection.; Targeted ultrasound examination of the left breast was performed COMPARISON:  Previous exams including recent screening mammogram dated 02/19/2021. ACR Breast Density Category c: The breast tissue is heterogeneously dense, which may obscure small masses. FINDINGS: LEFT breast diagnostic mammogram: On today's additional diagnostic views, there is a persistent possible subtle architectural distortion within the upper LEFT breast, only redemonstrated on today's MLO (MLO slice 28), less conspicuous on today's true lateral views (true lateral slice 34), located at the 11-12 o'clock axis based on tomosynthesis slice position. Targeted ultrasound is performed, evaluating the entire upper LEFT breast, showing only normal fibroglandular tissues and fat lobules throughout. There are ridges of normal dense fibroglandular tissue at the 11 o'clock and 1 o'clock axes, possible correlate for the mammographic finding. IMPRESSION: Possible  subtle architectural distortion within the upper LEFT breast, only convincingly redemonstrated on today's MLO view, without a corresponding abnormality on ultrasound. This may merely represent superimposition of normal dense fibroglandular tissues. Attempt at stereotactic biopsy is recommended. RECOMMENDATION: 1. Stereotactic biopsy, with 3D tomosynthesis guidance, for the possible subtle architectural distortion in the upper LEFT breast. 2. If a suitable target cannot be reproduced on the day of biopsy, recommend six-month follow-up LEFT breast diagnostic mammogram per protocol. Patient is aware of this possibility Ordering physician will be contacted and stereotactic biopsy will be scheduled at patient's convenience. I have discussed the findings and recommendations with the patient. If applicable, a reminder letter will be sent to the patient regarding the next appointment. BI-RADS CATEGORY  4: Suspicious. Electronically Signed   By: Franki Cabot M.D.   On: 03/03/2021 14:43       Assessment & Plan:   Problem List Items Addressed This Visit    Bleeding    Noticed tongue bleeding - after eating yesterday.  No significant lesion noted on exam.  Trial of nystatin suspension as directed.  Follow.  No evidence of infection.  Notify me if bleeding returns.       Hypertension    Blood pressure doing well.  Continue amlodipine and hctz.  Follow pressures.  Follow metabolic panel.       Left foot pain    Question of morton's neuroma.  Order placed for podiatry referral.  Good support shoes.       Relevant Orders   Ambulatory referral to Podiatry   Stress    Overall appears to be handling things well.           Einar Pheasant, MD

## 2021-03-04 NOTE — Progress Notes (Signed)
Ms Jamie Elliott wants to see surgery before scheduling biopsy.

## 2021-03-04 NOTE — Progress Notes (Signed)
Order placed for referral to surgery.  

## 2021-03-06 ENCOUNTER — Other Ambulatory Visit: Payer: Self-pay | Admitting: Internal Medicine

## 2021-03-07 ENCOUNTER — Encounter: Payer: Self-pay | Admitting: Podiatry

## 2021-03-07 ENCOUNTER — Ambulatory Visit (INDEPENDENT_AMBULATORY_CARE_PROVIDER_SITE_OTHER): Payer: Medicare Other

## 2021-03-07 ENCOUNTER — Other Ambulatory Visit: Payer: Self-pay

## 2021-03-07 ENCOUNTER — Other Ambulatory Visit: Payer: Self-pay | Admitting: Podiatry

## 2021-03-07 ENCOUNTER — Ambulatory Visit (INDEPENDENT_AMBULATORY_CARE_PROVIDER_SITE_OTHER): Payer: Medicare Other | Admitting: Podiatry

## 2021-03-07 DIAGNOSIS — G5762 Lesion of plantar nerve, left lower limb: Secondary | ICD-10-CM | POA: Diagnosis not present

## 2021-03-07 DIAGNOSIS — M779 Enthesopathy, unspecified: Secondary | ICD-10-CM

## 2021-03-07 NOTE — Progress Notes (Signed)
   HPI: 75 y.o. female presenting today presenting today as a new patient referral from her PCP for evaluation of numbness and an injury that occurred to the patient's left foot approximately 2 months ago.  Patient states that she twisted her body and shifted sideways however her foot was planted at the time.  The following few days she had swelling and bruising to her left forefoot.  Ever since that injury she also has had numbness to that area.  The patient does not have pain associated to the area.  She presents for further treatment evaluation  Past Medical History:  Diagnosis Date  . Environmental allergies   . Hypercholesterolemia   . Hypertension   . Migraines      Physical Exam: General: The patient is alert and oriented x3 in no acute distress.  Dermatology: Skin is warm, dry and supple bilateral lower extremities. Negative for open lesions or macerations.  Vascular: Palpable pedal pulses bilaterally. No edema or erythema noted. Capillary refill within normal limits.  Neurological: Epicritic and protective threshold grossly intact bilaterally.  Paresthesia with numbness between the second and third digits of the left foot  Musculoskeletal Exam: Range of motion within normal limits to all pedal and ankle joints bilateral. Muscle strength 5/5 in all groups bilateral.   Radiographic Exam:  Normal osseous mineralization. Joint spaces preserved. No fracture/dislocation/boney destruction.    Assessment: 1.  Morton's neuroma left   Plan of Care:  1. Patient evaluated. X-Rays reviewed.  2.  Currently the patient is not dealing with any significant pain.  She simply has some numbness which is likely due to nerve irritation that occurred when she twisted her foot. 3.  I explained to the patient that the numbness may be permanent.  This would be alleviated perhaps by wearing wider fitting shoes and shoes that do not constrict the toebox area 4.  Return to clinic as needed       Edrick Kins, DPM [facility]  Dr. Edrick Kins, DPM    [address], [place] [address]                Office [phone]  Fax [phone]

## 2021-03-09 ENCOUNTER — Encounter: Payer: Self-pay | Admitting: Internal Medicine

## 2021-03-09 NOTE — Assessment & Plan Note (Signed)
Overall appears to be doing better.  Seeing her grandchildren.  Follow.

## 2021-03-09 NOTE — Assessment & Plan Note (Signed)
Saw urology 10/2020.  Stable.  Recommended f/u in 6 months.

## 2021-03-09 NOTE — Assessment & Plan Note (Signed)
Continue amlodipine and hctz.  Blood pressure doing well.  Follow pressures.  Follow metabolic panel.

## 2021-03-09 NOTE — Assessment & Plan Note (Signed)
Seeing urology.  On suppressive abx.  Doing well.  No symptoms.  Follow.

## 2021-03-09 NOTE — Assessment & Plan Note (Signed)
On lipitor.  Low cholesterol diet and exercise.  Follow lipid panel and liver function tests.   

## 2021-03-09 NOTE — Assessment & Plan Note (Signed)
Recent injury.  No pain now.  Numbness - appears to be c/w Morton's Neuroma.  She request referral to podiatry.  Order placed for referral.

## 2021-03-13 DIAGNOSIS — R928 Other abnormal and inconclusive findings on diagnostic imaging of breast: Secondary | ICD-10-CM | POA: Diagnosis not present

## 2021-03-15 ENCOUNTER — Encounter: Payer: Self-pay | Admitting: Internal Medicine

## 2021-03-15 DIAGNOSIS — R58 Hemorrhage, not elsewhere classified: Secondary | ICD-10-CM | POA: Insufficient documentation

## 2021-03-15 NOTE — Assessment & Plan Note (Signed)
Overall appears to be handling things well.   

## 2021-03-15 NOTE — Assessment & Plan Note (Signed)
Blood pressure doing well.  Continue amlodipine and hctz.  Follow pressures.  Follow metabolic panel.

## 2021-03-15 NOTE — Assessment & Plan Note (Signed)
Noticed tongue bleeding - after eating yesterday.  No significant lesion noted on exam.  Trial of nystatin suspension as directed.  Follow.  No evidence of infection.  Notify me if bleeding returns.

## 2021-03-15 NOTE — Assessment & Plan Note (Signed)
Question of morton's neuroma.  Order placed for podiatry referral.  Good support shoes.

## 2021-03-17 ENCOUNTER — Other Ambulatory Visit: Payer: Self-pay | Admitting: Internal Medicine

## 2021-03-24 ENCOUNTER — Other Ambulatory Visit: Payer: Self-pay | Admitting: Urology

## 2021-03-24 DIAGNOSIS — N390 Urinary tract infection, site not specified: Secondary | ICD-10-CM

## 2021-03-27 NOTE — Progress Notes (Signed)
03/28/2021 8:59 AM   Jamie Elliott [birth year] [id number]  Referring provider: Einar Elliott, [address] [place],  [place] [address]  Chief Complaint  Patient presents with  . Follow-up    53mth follow-up    Urological history: 1. rUTI's -Risk factors: age, vaginal atrophy and constipation -KUB 12/2017 constipation.  Cystoscopy performed on 02/02/2018 by Dr. Erlene Elliott noted subtle trigonitis but no significant pathology.   -documented positive urine cultures over the last year  + E.coli resistant ampicillin, tetracycline nitrofurantoin 100 mg x 5 day 04/29/2020  + E.coli pan-sensitive Septra DS x 7 days 10/28/2020  + E.coli pan-sensitive Levaquin 750 mg x 5 days 12/18/2020  2. Angiomyolipoma -RUS 11/13/2020 ~ 2 cm cyst in the left kidney and a ~ 1 cm hyperechoic lesion in the right likely an angiomyolipoma.  3. Vaginal atrophy -cervix, uterus and ovaries removed in 2000.  No malignancies noted -abnormality found in left breast on 02/2021 mammogram/breast ultrasound-currently under surveillance   HPI: Jamie Elliott is a 75 y.o. female who presents today for a three months follow up after being placed on suppressive antibiotics (trimethoprim).   PVR 55 mL.      She has not had any breakthrough infections while on the trimethoprim, but she has some upcoming cataract surgery and she has some pause regarding stopping the medication.  Patient denies any modifying or aggravating factors.  Patient denies any gross hematuria, dysuria or suprapubic/flank pain.  Patient denies any fevers, chills, nausea or vomiting.    PMH: Past Medical History:  Diagnosis Date  . Environmental allergies   . Hypercholesterolemia   . Hypertension   . Migraines     Surgical History: Past Surgical History:  Procedure Laterality Date  . ABDOMINAL HYSTERECTOMY  2000  . APPENDECTOMY  2000  . NOSE SURGERY  1967  . SEPTOPLASTY    . SHOULDER ARTHROSCOPY WITH OPEN  ROTATOR CUFF REPAIR Right 12/05/2015   Procedure: SHOULDER ARTHROSCOPY WITH MINI OPEN ROTATOR CUFF REPAIR. DISTAL CLAVICLE EXCISION;  Surgeon: Jamie Park, MD;  Location: [facility] ORS;  Service: Orthopedics;  Laterality: Right;    Home Medications:  Current Outpatient Medications on File Prior to Visit  Medication Sig Dispense Refill  . acetaminophen (TYLENOL) 500 MG tablet Take 500 mg by mouth every 6 (six) hours as needed.    Marland Kitchen amLODipine (NORVASC) 5 MG tablet Take 1 tablet (5 mg total) by mouth daily. 30 tablet 0  . Ascorbic Acid (VITAMIN C) 1000 MG tablet Take 1,000 mg by mouth daily.    Marland Kitchen aspirin 325 MG EC tablet Take 325 mg by mouth daily.    Marland Kitchen atorvastatin (LIPITOR) 40 MG tablet Take 1 tablet (40 mg total) by mouth daily. 90 tablet 1  . cetirizine (ZYRTEC) 10 MG tablet Take 1 tablet (10 mg total) by mouth daily. 30 tablet 0  . Cholecalciferol (VITAMIN D3) 25 MCG (1000 UT) CAPS Take 1 capsule by mouth daily.    Marland Kitchen CRANBERRY EXTRACT PO Take 1 capsule by mouth 2 (two) times daily.    . D-Mannose 500 MG CAPS Take 500 mg by mouth in the morning and at bedtime.    Marland Kitchen estradiol (ESTRACE VAGINAL) 0.1 MG/GM vaginal cream Apply 0.5mg  (pea-sized amount)  just inside the vaginal introitus with a finger-tip on Monday, Wednesday and Friday nights. 30 g 12  . hydrochlorothiazide (HYDRODIURIL) 12.5 MG tablet TAKE ONE TABLET DAILY. 90 tablet 0  . losartan (COZAAR) 100 MG tablet Take 1 tablet by  mouth daily. 90 tablet 0  . nystatin (MYCOSTATIN) 100000 UNIT/ML suspension 5 mls swish and spit bid x 1 week. 60 mL 0  . polyethylene glycol (MIRALAX / GLYCOLAX) 17 g packet Take 17 g by mouth every other day.    . Probiotic Product (ALIGN PO) Take by mouth as needed.    . triamcinolone (NASACORT) 55 MCG/ACT nasal inhaler Place 2 sprays into the nose as needed. 1 Inhaler 5   No current facility-administered medications on file prior to visit.    Allergies:  Allergies  Allergen Reactions  . Ciprofloxacin  Other (See Comments)    Patient reports "it doesn't work for me". Last culture was sensitive, however.   . Zithromax [Azithromycin]   . Augmentin [Amoxicillin-Pot Clavulanate] Rash    Family History: Family History  Problem Relation Age of Onset  . Heart disease Mother   . Hyperlipidemia Mother   . Hypertension Mother   . Cancer Father        lung, prostate  . Hyperlipidemia Father   . Hypertension Father   . Prostate cancer Father   . Bladder Cancer Neg Hx   . Kidney cancer Neg Hx     Social History:  reports that she has never smoked. She has never used smokeless tobacco. She reports that she does not drink alcohol and does not use drugs.  ROS: Pertinent ROS in HPI  Physical Exam: BP 131/78   Pulse 69   Ht 5\' 2"  (1.575 m)   Wt 125 lb (56.7 kg)   LMP 11/28/1998   BMI 22.86 kg/m   Constitutional:  Well nourished. Alert and oriented, No acute distress. HEENT: [place] AT, mask in place.  Trachea midline Cardiovascular: No clubbing, cyanosis, or edema. Respiratory: Normal respiratory effort, no increased work of breathing. Neurologic: Grossly intact, no focal deficits, moving all 4 extremities. Psychiatric: Normal mood and affect.   Laboratory Data: Lab Results  Component Value Date   CREATININE 0.84 01/15/2021      Component Value Date/Time   CHOL 180 01/15/2021 0837   HDL 63.20 01/15/2021 0837   CHOLHDL 3 01/15/2021 0837   VLDL 24.8 01/15/2021 0837   LDLCALC 92 01/15/2021 0837   Lab Results  Component Value Date   AST 19 01/15/2021   Lab Results  Component Value Date   ALT 22 01/15/2021   Component     Latest Ref Rng & Units 01/15/2021  VITD     30.00 - 100.00 ng/mL 22.59 (L)  I have reviewed the labs.   Pertinent Imaging: Results for Jamie, Elliott (MRN [id number]) as of 03/28/2021 08:57  Ref. Range 03/28/2021 08:41  Scan Result Unknown 32ml    Assessment & Plan:    1. rUTI's -No breakthrough infections while on trimethoprim suppression -As she has  upcoming scheduled cataract procedures will extend the trimethoprim suppression for another 3 months                                   2. Vaginal atrophy -discontinue Estrace cream due to abnormal finding on mammogram and breast ultrasound  3. Right renal angiomyolipoma - will obtain a RUS in 6 months (May 2022) to evaluate stability  -I will call with results  Return in about 3 months (around 06/27/2021) for recheck .  These notes generated with voice recognition software. I apologize for typographical errors.  Zara Council, [facility] [address]  [place], [place] [address] [phone]

## 2021-03-28 ENCOUNTER — Encounter: Payer: Self-pay | Admitting: Urology

## 2021-03-28 ENCOUNTER — Other Ambulatory Visit: Payer: Self-pay

## 2021-03-28 ENCOUNTER — Ambulatory Visit (INDEPENDENT_AMBULATORY_CARE_PROVIDER_SITE_OTHER): Payer: Medicare Other | Admitting: Urology

## 2021-03-28 VITALS — BP 131/78 | HR 69 | Ht 62.0 in | Wt 125.0 lb

## 2021-03-28 DIAGNOSIS — N390 Urinary tract infection, site not specified: Secondary | ICD-10-CM | POA: Diagnosis not present

## 2021-03-28 DIAGNOSIS — D1771 Benign lipomatous neoplasm of kidney: Secondary | ICD-10-CM

## 2021-03-28 DIAGNOSIS — N952 Postmenopausal atrophic vaginitis: Secondary | ICD-10-CM | POA: Diagnosis not present

## 2021-03-28 LAB — BLADDER SCAN AMB NON-IMAGING

## 2021-03-28 MED ORDER — TRIMETHOPRIM 100 MG PO TABS: 100.0000 mg | ORAL_TABLET | Freq: Every day | ORAL | 0 refills | Status: DC

## 2021-04-08 DIAGNOSIS — H2512 Age-related nuclear cataract, left eye: Secondary | ICD-10-CM | POA: Diagnosis not present

## 2021-04-10 ENCOUNTER — Other Ambulatory Visit: Payer: Self-pay | Admitting: Internal Medicine

## 2021-04-14 ENCOUNTER — Other Ambulatory Visit: Payer: Self-pay | Admitting: Internal Medicine

## 2021-04-15 ENCOUNTER — Encounter: Payer: Self-pay | Admitting: Ophthalmology

## 2021-04-21 NOTE — Discharge Instructions (Signed)

## 2021-04-23 ENCOUNTER — Ambulatory Visit
Admission: RE | Admit: 2021-04-23 | Discharge: 2021-04-23 | Disposition: A | Discharge status: Home / Self Care | Payer: Medicare Other | Attending: Ophthalmology | Admitting: Ophthalmology

## 2021-04-23 ENCOUNTER — Ambulatory Visit: Payer: Medicare Other | Admitting: Anesthesiology

## 2021-04-23 ENCOUNTER — Encounter: Payer: Self-pay | Admitting: Ophthalmology

## 2021-04-23 ENCOUNTER — Encounter
Admission: RE | Disposition: A | Discharge status: Home / Self Care | Payer: Self-pay | Source: Home / Self Care | Attending: Ophthalmology

## 2021-04-23 ENCOUNTER — Other Ambulatory Visit: Payer: Self-pay

## 2021-04-23 DIAGNOSIS — Z881 Allergy status to other antibiotic agents status: Secondary | ICD-10-CM | POA: Insufficient documentation

## 2021-04-23 DIAGNOSIS — Z79899 Other long term (current) drug therapy: Secondary | ICD-10-CM | POA: Insufficient documentation

## 2021-04-23 DIAGNOSIS — H25812 Combined forms of age-related cataract, left eye: Secondary | ICD-10-CM | POA: Diagnosis not present

## 2021-04-23 DIAGNOSIS — H2512 Age-related nuclear cataract, left eye: Secondary | ICD-10-CM | POA: Insufficient documentation

## 2021-04-23 HISTORY — PX: CATARACT EXTRACTION W/PHACO: SHX586

## 2021-04-23 SURGERY — PHACOEMULSIFICATION, CATARACT, WITH IOL INSERTION
Anesthesia: Monitor Anesthesia Care | Site: Eye | Laterality: Left

## 2021-04-23 MED ORDER — ACETAMINOPHEN 325 MG PO TABS: 325.0000 mg | ORAL_TABLET | ORAL | Status: DC | PRN

## 2021-04-23 MED ORDER — ARMC OPHTHALMIC DILATING DROPS
1.0000 "application " | OPHTHALMIC | Status: DC | PRN
  Administered 2021-04-23 (×3): 1 via OPHTHALMIC

## 2021-04-23 MED ORDER — NA HYALUR & NA CHOND-NA HYALUR 0.4-0.35 ML IO KIT
PACK | INTRAOCULAR | Status: DC | PRN
  Administered 2021-04-23: 1 mL via INTRAOCULAR

## 2021-04-23 MED ORDER — ACETAMINOPHEN 160 MG/5ML PO SOLN: 325.0000 mg | ORAL | Status: DC | PRN

## 2021-04-23 MED ORDER — ONDANSETRON HCL 4 MG/2ML IJ SOLN
4.0000 mg | Freq: Once | INTRAMUSCULAR | Status: DC | PRN
Start: 2021-04-23 — End: 2021-04-23

## 2021-04-23 MED ORDER — BRIMONIDINE TARTRATE-TIMOLOL 0.2-0.5 % OP SOLN
OPHTHALMIC | Status: DC | PRN
  Administered 2021-04-23: 1 [drp] via OPHTHALMIC

## 2021-04-23 MED ORDER — EPINEPHRINE PF 1 MG/ML IJ SOLN
INTRAOCULAR | Status: DC | PRN
  Administered 2021-04-23: 73 mL via OPHTHALMIC

## 2021-04-23 MED ORDER — MIDAZOLAM HCL 2 MG/2ML IJ SOLN
INTRAMUSCULAR | Status: DC | PRN
  Administered 2021-04-23: 1 mg via INTRAVENOUS

## 2021-04-23 MED ORDER — CEFUROXIME OPHTHALMIC INJECTION 1 MG/0.1 ML
INJECTION | OPHTHALMIC | Status: DC | PRN
  Administered 2021-04-23: 0.1 mL via INTRACAMERAL

## 2021-04-23 MED ORDER — LIDOCAINE HCL (PF) 2 % IJ SOLN
INTRAOCULAR | Status: DC | PRN
  Administered 2021-04-23: 1 mL

## 2021-04-23 MED ORDER — TETRACAINE HCL 0.5 % OP SOLN
1.0000 [drp] | OPHTHALMIC | Status: DC | PRN
  Administered 2021-04-23 (×3): 1 [drp] via OPHTHALMIC

## 2021-04-23 MED ORDER — FENTANYL CITRATE (PF) 100 MCG/2ML IJ SOLN
INTRAMUSCULAR | Status: DC | PRN
  Administered 2021-04-23: 50 ug via INTRAVENOUS

## 2021-04-23 SURGICAL SUPPLY — 20 items
CANNULA ANT/CHMB 27GA (MISCELLANEOUS) ×2 IMPLANT
GLOVE SURG ENC TEXT LTX SZ7.5 (GLOVE) ×2 IMPLANT
GLOVE SURG TRIUMPH 8.0 PF LTX (GLOVE) ×2 IMPLANT
GOWN STRL REUS W/ TWL LRG LVL3 (GOWN DISPOSABLE) ×2 IMPLANT
GOWN STRL REUS W/TWL LRG LVL3 (GOWN DISPOSABLE) ×4
LENS IOL IQ PAN TRC 30 26.0 ×1 IMPLANT
LENS IOL PANOP TORIC 30 26.0 ×1 IMPLANT
LENS IOL PANOPTIX TORIC 26.0 ×2 IMPLANT
MARKER SKIN DUAL TIP RULER LAB (MISCELLANEOUS) ×2 IMPLANT
NEEDLE CAPSULORHEX 25GA (NEEDLE) ×2 IMPLANT
NEEDLE FILTER BLUNT 18X 1/2SAF (NEEDLE) ×2
NEEDLE FILTER BLUNT 18X1 1/2 (NEEDLE) ×2 IMPLANT
PACK CATARACT BRASINGTON (MISCELLANEOUS) ×2 IMPLANT
PACK EYE AFTER SURG (MISCELLANEOUS) ×2 IMPLANT
PACK OPTHALMIC (MISCELLANEOUS) ×2 IMPLANT
SOLUTION OPHTHALMIC SALT (MISCELLANEOUS) ×2 IMPLANT
SYR 3ML LL SCALE MARK (SYRINGE) ×4 IMPLANT
SYR TB 1ML LUER SLIP (SYRINGE) ×2 IMPLANT
WATER STERILE IRR 250ML POUR (IV SOLUTION) ×2 IMPLANT
WIPE NON LINTING 3.25X3.25 (MISCELLANEOUS) ×2 IMPLANT

## 2021-04-23 NOTE — Transfer of Care (Signed)
Immediate Anesthesia Transfer of Care Note  Patient: Jamie Elliott  Procedure(s) Performed: CATARACT EXTRACTION PHACO AND INTRAOCULAR LENS PLACEMENT (IOC) LEFT panoptix toric 10.41 01:22.8 12.6% (Left Eye)  Patient Location: PACU  Anesthesia Type: MAC  Level of Consciousness: awake, alert  and patient cooperative  Airway and Oxygen Therapy: Patient Spontanous Breathing   Post-op Assessment: Post-op Vital signs reviewed, Patient's Cardiovascular Status Stable, Respiratory Function Stable, Patent Airway and No signs of Nausea or vomiting  Post-op Vital Signs: Reviewed and stable  Complications: No complications documented.

## 2021-04-23 NOTE — Op Note (Signed)
  LOCATION:  [facility]   PREOPERATIVE DIAGNOSIS:  Nuclear sclerotic cataract of the left eye.  H25.12  POSTOPERATIVE DIAGNOSIS:  Nuclear sclerotic cataract of the left eye.   PROCEDURE:  Phacoemulsification with Toric posterior chamber intraocular lens placement of the left eye.  Ultrasound time: Procedure(s): CATARACT EXTRACTION PHACO AND INTRAOCULAR LENS PLACEMENT (IOC) LEFT panoptix toric 10.41 01:22.8 12.6% (Left) LENS: [id number] 26.0D Panoptix Toric intraocular lens with 1.5 diopters of cylindrical power with axis orientation at 178 degrees.   SURGEON:  Wyonia Hough, MD   ANESTHESIA:  Topical with tetracaine drops and 2% Xylocaine jelly, augmented with 1% preservative-free intracameral lidocaine.  COMPLICATIONS:  None.   DESCRIPTION OF PROCEDURE:  The patient was identified in the holding room and transported to the operating suite and placed in the supine position under the operating microscope.  The left eye was identified as the operative eye, and it was prepped and draped in the usual sterile ophthalmic fashion.    A clear-corneal paracentesis incision was made at the 1:30 position.  0.5 ml of preservative-free 1% lidocaine was injected into the anterior chamber. The anterior chamber was filled with Viscoat.  A 2.4 millimeter near clear corneal incision was then made at the 10:30 position.  A cystotome and capsulorrhexis forceps were then used to make a curvilinear capsulorrhexis.  Hydrodissection and hydrodelineation were then performed using balanced salt solution.   Phacoemulsification was then used in stop and chop fashion to remove the lens, nucleus and epinucleus.  The remaining cortex was aspirated using the irrigation and aspiration handpiece.  Provisc viscoelastic was then placed into the capsular bag to distend it for lens placement.  The Verion digital marker was used to align the implant at the intended axis.   A 26.0 diopter lens was then injected into  the capsular bag.  It was rotated clockwise until the axis marks on the lens were approximately 15 degrees in the counterclockwise direction to the intended alignment.  The viscoelastic was aspirated from the eye using the irrigation aspiration handpiece.  Then, a Koch spatula through the sideport incision was used to rotate the lens in a clockwise direction until the axis markings of the intraocular lens were lined up with the Verion alignment.  Balanced salt solution was then used to hydrate the wounds. Cefuroxime 0.1 ml of a 10mg /ml solution was injected into the anterior chamber for a dose of 1 mg of intracameral antibiotic at the completion of the case.    The eye was noted to have a physiologic pressure and there was no wound leak noted.   Timolol and Brimonidine drops were applied to the eye.  The patient was taken to the recovery room in stable condition having had no complications of anesthesia or surgery.  Jamie Elliott 04/23/2021, 8:56 AM

## 2021-04-23 NOTE — Anesthesia Postprocedure Evaluation (Signed)
Anesthesia Post Note  Patient: Jamie Elliott  Procedure(s) Performed: CATARACT EXTRACTION PHACO AND INTRAOCULAR LENS PLACEMENT (IOC) LEFT panoptix toric 10.41 01:22.8 12.6% (Left Eye)     Patient location during evaluation: PACU Anesthesia Type: MAC Level of consciousness: awake and alert Pain management: pain level controlled Vital Signs Assessment: post-procedure vital signs reviewed and stable Respiratory status: spontaneous breathing Cardiovascular status: blood pressure returned to baseline Postop Assessment: no apparent nausea or vomiting, adequate PO intake and no headache Anesthetic complications: no   No complications documented.  Adele Barthel Vani Gunner

## 2021-04-23 NOTE — Anesthesia Preprocedure Evaluation (Signed)
Anesthesia Evaluation  Patient identified by MRN, date of birth, ID band Patient awake    History of Anesthesia Complications Negative for: history of anesthetic complications  Airway Mallampati: II  TM Distance: >3 FB Neck ROM: Full    Dental no notable dental hx.    Pulmonary neg pulmonary ROS,    Pulmonary exam normal        Cardiovascular Exercise Tolerance: Good hypertension, Pt. on medications Normal cardiovascular exam     Neuro/Psych negative neurological ROS     GI/Hepatic negative GI ROS, Neg liver ROS,   Endo/Other  negative endocrine ROS  Renal/GU negative Renal ROS     Musculoskeletal   Abdominal   Peds  Hematology negative hematology ROS (+)   Anesthesia Other Findings   Reproductive/Obstetrics                             Anesthesia Physical Anesthesia Plan  ASA: II  Anesthesia Plan: MAC   Post-op Pain Management:    Induction:   PONV Risk Score and Plan: 2 and TIVA, Midazolam and Treatment may vary due to age or medical condition  Airway Management Planned: Nasal Cannula and Natural Airway  Additional Equipment: None  Intra-op Plan:   Post-operative Plan:   Informed Consent: I have reviewed the patients History and Physical, chart, labs and discussed the procedure including the risks, benefits and alternatives for the proposed anesthesia with the patient or authorized representative who has indicated his/her understanding and acceptance.       Plan Discussed with: CRNA  Anesthesia Plan Comments:         Anesthesia Quick Evaluation

## 2021-04-23 NOTE — H&P (Signed)
[facility]   Primary Care Physician:  Einar Pheasant, MD Ophthalmologist: Dr. Leandrew Koyanagi  Pre-Procedure History & Physical: HPI:  Jamie Elliott is a 75 y.o. female here for ophthalmic surgery.   Past Medical History:  Diagnosis Date  . Environmental allergies   . Hypercholesterolemia   . Hypertension   . Migraines     Past Surgical History:  Procedure Laterality Date  . ABDOMINAL HYSTERECTOMY  2000  . APPENDECTOMY  2000  . NOSE SURGERY  1967  . SEPTOPLASTY    . SHOULDER ARTHROSCOPY WITH OPEN ROTATOR CUFF REPAIR Right 12/05/2015   Procedure: SHOULDER ARTHROSCOPY WITH MINI OPEN ROTATOR CUFF REPAIR. DISTAL CLAVICLE EXCISION;  Surgeon: Thornton Park, MD;  Location: [facility] ORS;  Service: Orthopedics;  Laterality: Right;    Prior to Admission medications   Medication Sig Start Date End Date Taking? Authorizing Provider  acetaminophen (TYLENOL) 500 MG tablet Take 500 mg by mouth every 6 (six) hours as needed.   Yes [provider]  amLODipine (NORVASC) 5 MG tablet Take 1 tablet (5 mg total) by mouth daily. 02/24/21  Yes Einar Pheasant, MD  Ascorbic Acid (VITAMIN C) 1000 MG tablet Take 1,000 mg by mouth daily.   Yes [provider]  atorvastatin (LIPITOR) 40 MG tablet Take 1 tablet (40 mg total) by mouth daily. 12/24/20  Yes Einar Pheasant, MD  cetirizine (ZYRTEC) 10 MG tablet Take 1 tablet (10 mg total) by mouth daily. 04/14/21  Yes Einar Pheasant, MD  Cholecalciferol (VITAMIN D3) 25 MCG (1000 UT) CAPS Take 1 capsule by mouth daily.   Yes [provider]  CRANBERRY EXTRACT PO Take 1 capsule by mouth 2 (two) times daily.   Yes [provider]  D-Mannose 500 MG CAPS Take 500 mg by mouth in the morning and at bedtime.   Yes [provider]  hydrochlorothiazide (HYDRODIURIL) 12.5 MG tablet TAKE ONE TABLET DAILY. 04/10/21  Yes Einar Pheasant, MD  losartan (COZAAR) 100 MG tablet Take 1 tablet by mouth daily. 04/10/21  Yes Einar Pheasant, MD  nystatin (MYCOSTATIN) 100000 UNIT/ML suspension 5 mls swish and spit bid x 1 week. 03/04/21  Yes Einar Pheasant, MD  polyethylene glycol (MIRALAX / GLYCOLAX) 17 g packet Take 17 g by mouth every other day.   Yes [provider]  Probiotic Product (ALIGN PO) Take by mouth as needed.   Yes [provider]  triamcinolone (NASACORT) 55 MCG/ACT nasal inhaler Place 2 sprays into the nose as needed. 03/22/13  Yes Einar Pheasant, MD  trimethoprim (TRIMPEX) 100 MG tablet Take 1 tablet (100 mg total) by mouth daily. 03/28/21  Yes McGowan, Shannon A, PA-C    Allergies as of 02/25/2021 - Review Complete 01/05/2021  Allergen Reaction Noted  . Ciprofloxacin Other (See Comments) 10/31/2015  . Zithromax [azithromycin]  11/28/2012  . Augmentin [amoxicillin-pot clavulanate] Rash 11/28/2012    Family History  Problem Relation Age of Onset  . Heart disease Mother   . Hyperlipidemia Mother   . Hypertension Mother   . Cancer Father        lung, prostate  . Hyperlipidemia Father   . Hypertension Father   . Prostate cancer Father   . Bladder Cancer Neg Hx   . Kidney cancer Neg Hx     Social History   Socioeconomic History  . Marital status: Married    Spouse name: Not on file  . Number of children: 2  . Years of education: Not on file  . Highest  education level: Not on file  Occupational History  . Not on file  Tobacco Use  . Smoking status: Never Smoker  . Smokeless tobacco: Never Used  Substance and Sexual Activity  . Alcohol use: No    Alcohol/week: 0.0 standard drinks  . Drug use: No  . Sexual activity: Not Currently  Other Topics Concern  . Not on file  Social History Narrative  . Not on file   Social Determinants of Health   Financial Resource Strain: Low Risk   . Difficulty of Paying Living Expenses: Not hard at all  Food Insecurity: No Food Insecurity  . Worried About Charity fundraiser in the Last Year: Never true  . Ran Out of Food in the Last  Year: Never true  Transportation Needs: No Transportation Needs  . Lack of Transportation (Medical): No  . Lack of Transportation (Non-Medical): No  Physical Activity: Not on file  Stress: No Stress Concern Present  . Feeling of Stress : Not at all  Social Connections: Unknown  . Frequency of Communication with Friends and Family: More than three times a week  . Frequency of Social Gatherings with Friends and Family: More than three times a week  . Attends Religious Services: Not on file  . Active Member of Clubs or Organizations: Not on file  . Attends Archivist Meetings: Not on file  . Marital Status: Married  Human resources officer Violence: Not At Risk  . Fear of Current or Ex-Partner: No  . Emotionally Abused: No  . Physically Abused: No  . Sexually Abused: No    Review of Systems: See HPI, otherwise negative ROS  Physical Exam: BP (!) 127/95   Pulse 90   Temp (!) 97.1 F (36.2 C)   Resp 20   Wt 54.7 kg   LMP 11/28/1998   SpO2 98%   BMI 22.08 kg/m  General:   Alert,  pleasant and cooperative in NAD Head:  Normocephalic and atraumatic. Lungs:  Clear to auscultation.    Heart:  Regular rate and rhythm.   Impression/Plan: Jamie Elliott is here for ophthalmic surgery.  Risks, benefits, limitations, and alternatives regarding ophthalmic surgery have been reviewed with the patient.  Questions have been answered.  All parties agreeable.   Leandrew Koyanagi, MD  04/23/2021, 8:12 AM

## 2021-04-23 NOTE — Anesthesia Procedure Notes (Signed)
Procedure Name: MAC Date/Time: 04/23/2021 8:39 AM Performed by: Vanetta Shawl, CRNA Pre-anesthesia Checklist: Patient identified, Emergency Drugs available, Suction available, Timeout performed and Patient being monitored Patient Re-evaluated:Patient Re-evaluated prior to induction Oxygen Delivery Method: Nasal cannula Placement Confirmation: positive ETCO2

## 2021-04-24 ENCOUNTER — Encounter: Payer: Self-pay | Admitting: Ophthalmology

## 2021-04-28 DIAGNOSIS — H2511 Age-related nuclear cataract, right eye: Secondary | ICD-10-CM | POA: Diagnosis not present

## 2021-04-29 ENCOUNTER — Encounter: Payer: Self-pay | Admitting: Ophthalmology

## 2021-05-06 NOTE — Discharge Instructions (Signed)

## 2021-05-07 ENCOUNTER — Encounter: Payer: Self-pay | Admitting: Ophthalmology

## 2021-05-07 ENCOUNTER — Ambulatory Visit: Payer: Medicare Other | Admitting: Anesthesiology

## 2021-05-07 ENCOUNTER — Encounter
Admission: RE | Disposition: A | Discharge status: Home / Self Care | Payer: Self-pay | Source: Home / Self Care | Attending: Ophthalmology

## 2021-05-07 ENCOUNTER — Ambulatory Visit
Admission: RE | Admit: 2021-05-07 | Discharge: 2021-05-07 | Disposition: A | Discharge status: Home / Self Care | Payer: Medicare Other | Attending: Ophthalmology | Admitting: Ophthalmology

## 2021-05-07 DIAGNOSIS — Z881 Allergy status to other antibiotic agents status: Secondary | ICD-10-CM | POA: Diagnosis not present

## 2021-05-07 DIAGNOSIS — Z79899 Other long term (current) drug therapy: Secondary | ICD-10-CM | POA: Diagnosis not present

## 2021-05-07 DIAGNOSIS — H2511 Age-related nuclear cataract, right eye: Secondary | ICD-10-CM | POA: Diagnosis not present

## 2021-05-07 DIAGNOSIS — H25811 Combined forms of age-related cataract, right eye: Secondary | ICD-10-CM | POA: Diagnosis not present

## 2021-05-07 DIAGNOSIS — H2512 Age-related nuclear cataract, left eye: Secondary | ICD-10-CM | POA: Diagnosis not present

## 2021-05-07 HISTORY — PX: CATARACT EXTRACTION W/PHACO: SHX586

## 2021-05-07 SURGERY — PHACOEMULSIFICATION, CATARACT, WITH IOL INSERTION
Anesthesia: Monitor Anesthesia Care | Site: Eye | Laterality: Right

## 2021-05-07 MED ORDER — OXYCODONE HCL 5 MG/5ML PO SOLN: 5.0000 mg | Freq: Once | ORAL | Status: DC | PRN

## 2021-05-07 MED ORDER — FENTANYL CITRATE (PF) 100 MCG/2ML IJ SOLN
INTRAMUSCULAR | Status: DC | PRN
  Administered 2021-05-07: 50 ug via INTRAVENOUS

## 2021-05-07 MED ORDER — LIDOCAINE HCL (PF) 2 % IJ SOLN
INTRAOCULAR | Status: DC | PRN
  Administered 2021-05-07: 1 mL

## 2021-05-07 MED ORDER — EPINEPHRINE PF 1 MG/ML IJ SOLN
INTRAOCULAR | Status: DC | PRN
  Administered 2021-05-07: 72 mL via OPHTHALMIC

## 2021-05-07 MED ORDER — LACTATED RINGERS IV SOLN: INTRAVENOUS | Status: DC

## 2021-05-07 MED ORDER — BRIMONIDINE TARTRATE-TIMOLOL 0.2-0.5 % OP SOLN
OPHTHALMIC | Status: DC | PRN
  Administered 2021-05-07: 1 [drp] via OPHTHALMIC

## 2021-05-07 MED ORDER — MOXIFLOXACIN HCL 0.5 % OP SOLN
OPHTHALMIC | Status: DC | PRN
  Administered 2021-05-07: 0.2 mL via OPHTHALMIC

## 2021-05-07 MED ORDER — ARMC OPHTHALMIC DILATING DROPS
1.0000 "application " | OPHTHALMIC | Status: DC | PRN
  Administered 2021-05-07 (×3): 1 via OPHTHALMIC

## 2021-05-07 MED ORDER — TETRACAINE HCL 0.5 % OP SOLN
1.0000 [drp] | OPHTHALMIC | Status: DC | PRN
  Administered 2021-05-07 (×3): 1 [drp] via OPHTHALMIC

## 2021-05-07 MED ORDER — MIDAZOLAM HCL 2 MG/2ML IJ SOLN
INTRAMUSCULAR | Status: DC | PRN
  Administered 2021-05-07: 2 mg via INTRAVENOUS

## 2021-05-07 MED ORDER — NA HYALUR & NA CHOND-NA HYALUR 0.4-0.35 ML IO KIT
PACK | INTRAOCULAR | Status: DC | PRN
  Administered 2021-05-07: 1 mL via INTRAOCULAR

## 2021-05-07 MED ORDER — OXYCODONE HCL 5 MG PO TABS: 5.0000 mg | ORAL_TABLET | Freq: Once | ORAL | Status: DC | PRN

## 2021-05-07 SURGICAL SUPPLY — 19 items
CANNULA ANT/CHMB 27GA (MISCELLANEOUS) ×2 IMPLANT
GLOVE SURG ENC TEXT LTX SZ7.5 (GLOVE) ×2 IMPLANT
GLOVE SURG TRIUMPH 8.0 PF LTX (GLOVE) ×2 IMPLANT
GOWN STRL REUS W/ TWL LRG LVL3 (GOWN DISPOSABLE) ×2 IMPLANT
GOWN STRL REUS W/TWL LRG LVL3 (GOWN DISPOSABLE) ×4
LENS IOL ACRSF IQ PAN 24.5 ×1 IMPLANT
LENS IOL IQ PANOPTIX 24.5 ×2 IMPLANT
MARKER SKIN DUAL TIP RULER LAB (MISCELLANEOUS) ×2 IMPLANT
NEEDLE CAPSULORHEX 25GA (NEEDLE) ×2 IMPLANT
NEEDLE FILTER BLUNT 18X 1/2SAF (NEEDLE) ×2
NEEDLE FILTER BLUNT 18X1 1/2 (NEEDLE) ×2 IMPLANT
PACK CATARACT BRASINGTON (MISCELLANEOUS) ×2 IMPLANT
PACK EYE AFTER SURG (MISCELLANEOUS) ×2 IMPLANT
PACK OPTHALMIC (MISCELLANEOUS) ×2 IMPLANT
SOLUTION OPHTHALMIC SALT (MISCELLANEOUS) ×2 IMPLANT
SYR 3ML LL SCALE MARK (SYRINGE) ×4 IMPLANT
SYR TB 1ML LUER SLIP (SYRINGE) ×2 IMPLANT
WATER STERILE IRR 250ML POUR (IV SOLUTION) ×2 IMPLANT
WIPE NON LINTING 3.25X3.25 (MISCELLANEOUS) ×2 IMPLANT

## 2021-05-07 NOTE — Anesthesia Postprocedure Evaluation (Signed)
Anesthesia Post Note  Patient: Jamie Elliott  Procedure(s) Performed: CATARACT EXTRACTION PHACO AND INTRAOCULAR LENS PLACEMENT (IOC) RIGHT  panoptix 12.85 01:33.8 13.7% (Right Eye)     Patient location during evaluation: PACU Anesthesia Type: MAC Level of consciousness: awake Pain management: pain level controlled Vital Signs Assessment: post-procedure vital signs reviewed and stable Respiratory status: spontaneous breathing Cardiovascular status: stable Anesthetic complications: no   No complications documented.  Gillian Scarce

## 2021-05-07 NOTE — Transfer of Care (Signed)
Immediate Anesthesia Transfer of Care Note  Patient: Jamie Elliott  Procedure(s) Performed: CATARACT EXTRACTION PHACO AND INTRAOCULAR LENS PLACEMENT (IOC) RIGHT  panoptix 12.85 01:33.8 13.7% (Right Eye)  Patient Location: PACU  Anesthesia Type: MAC  Level of Consciousness: awake, alert  and patient cooperative  Airway and Oxygen Therapy: Patient Spontanous Breathing and Patient connected to supplemental oxygen  Post-op Assessment: Post-op Vital signs reviewed, Patient's Cardiovascular Status Stable, Respiratory Function Stable, Patent Airway and No signs of Nausea or vomiting  Post-op Vital Signs: Reviewed and stable  Complications: No complications documented.

## 2021-05-07 NOTE — Op Note (Signed)
  LOCATION:  [place]   PREOPERATIVE DIAGNOSIS:    Nuclear sclerotic cataract right eye. H25.11   POSTOPERATIVE DIAGNOSIS:  Nuclear sclerotic cataract right eye.     PROCEDURE:  Phacoemusification with posterior chamber intraocular lens placement of the right eye   ULTRASOUND TIME: Procedure(s): CATARACT EXTRACTION PHACO AND INTRAOCULAR LENS PLACEMENT (IOC) RIGHT  panoptix 12.85 01:33.8 13.7% (Right)  LENS:   Implant Name Type Inv. Item Serial No. Manufacturer Lot No. LRB No. Used Action  LENS IOL IQ PANOPTIX 24.5 - [id number]  LENS IOL IQ PANOPTIX 24.5 [id number] ALCON  Right 1 Implanted         SURGEON:  Wyonia Hough, MD   ANESTHESIA:  Topical with tetracaine drops and 2% Xylocaine jelly, augmented with 1% preservative-free intracameral lidocaine.    COMPLICATIONS:  None.   DESCRIPTION OF PROCEDURE:  The patient was identified in the holding room and transported to the operating room and placed in the supine position under the operating microscope.  The right eye was identified as the operative eye and it was prepped and draped in the usual sterile ophthalmic fashion.   A 1 millimeter clear-corneal paracentesis was made at the 12:00 position.  0.5 ml of preservative-free 1% lidocaine was injected into the anterior chamber. The anterior chamber was filled with Viscoat viscoelastic.  A 2.4 millimeter keratome was used to make a near-clear corneal incision at the 9:00 position.  A curvilinear capsulorrhexis was made with a cystotome and capsulorrhexis forceps.  Balanced salt solution was used to hydrodissect and hydrodelineate the nucleus.   Phacoemulsification was then used in stop and chop fashion to remove the lens nucleus and epinucleus.  The remaining cortex was then removed using the irrigation and aspiration handpiece. Provisc was then placed into the capsular bag to distend it for lens placement.  A lens was then injected into the capsular bag.  The  remaining viscoelastic was aspirated.   Wounds were hydrated with balanced salt solution.  The anterior chamber was inflated to a physiologic pressure with balanced salt solution.  No wound leaks were noted. Vigamox 0.2 ml of a 1mg  per ml solution was injected into the anterior chamber for a dose of 0.2 mg of intracameral antibiotic at the completion of the case.   Timolol and Brimonidine drops were applied to the eye.  The patient was taken to the recovery room in stable condition without complications of anesthesia or surgery.   Taksh Hjort 05/07/2021, 9:02 AM

## 2021-05-07 NOTE — Anesthesia Procedure Notes (Signed)
Procedure Name: MAC Date/Time: 05/07/2021 8:46 AM Performed by: Silvana Newness, CRNA Pre-anesthesia Checklist: Patient identified, Emergency Drugs available, Suction available, Patient being monitored and Timeout performed Patient Re-evaluated:Patient Re-evaluated prior to induction Oxygen Delivery Method: Nasal cannula Placement Confirmation: positive ETCO2

## 2021-05-07 NOTE — Anesthesia Preprocedure Evaluation (Signed)
Anesthesia Evaluation  Patient identified by MRN, date of birth, ID band Patient awake    Reviewed: Allergy & Precautions, H&P , NPO status , Patient's Chart, lab work & pertinent test results  History of Anesthesia Complications Negative for: history of anesthetic complications  Airway Mallampati: II  TM Distance: >3 FB Neck ROM: full    Dental no notable dental hx.    Pulmonary neg pulmonary ROS,    Pulmonary exam normal        Cardiovascular hypertension, On Medications Normal cardiovascular exam Rhythm:regular Rate:Normal     Neuro/Psych    GI/Hepatic negative GI ROS, Neg liver ROS,   Endo/Other  negative endocrine ROS  Renal/GU negative Renal ROS  negative genitourinary   Musculoskeletal   Abdominal   Peds  Hematology negative hematology ROS (+)   Anesthesia Other Findings   Reproductive/Obstetrics                             Anesthesia Physical Anesthesia Plan  ASA: II  Anesthesia Plan: MAC   Post-op Pain Management:    Induction:   PONV Risk Score and Plan: Treatment may vary due to age or medical condition  Airway Management Planned:   Additional Equipment:   Intra-op Plan:   Post-operative Plan:   Informed Consent: I have reviewed the patients History and Physical, chart, labs and discussed the procedure including the risks, benefits and alternatives for the proposed anesthesia with the patient or authorized representative who has indicated his/her understanding and acceptance.       Plan Discussed with:   Anesthesia Plan Comments:         Anesthesia Quick Evaluation

## 2021-05-07 NOTE — H&P (Signed)
[facility]   Primary Care Physician:  Einar Pheasant, MD Ophthalmologist: Dr. Leandrew Koyanagi  Pre-Procedure History & Physical: HPI:  Jamie Elliott is a 74 y.o. female here for ophthalmic surgery.   Past Medical History:  Diagnosis Date  . Environmental allergies   . Hypercholesterolemia   . Hypertension   . Migraines     Past Surgical History:  Procedure Laterality Date  . ABDOMINAL HYSTERECTOMY  2000  . APPENDECTOMY  2000  . CATARACT EXTRACTION W/PHACO Left 04/23/2021   Procedure: CATARACT EXTRACTION PHACO AND INTRAOCULAR LENS PLACEMENT (IOC) LEFT panoptix toric 10.41 01:22.8 12.6%;  Surgeon: Leandrew Koyanagi, MD;  Location: [place];  Service: Ophthalmology;  Laterality: Left;  . NOSE SURGERY  1967  . SEPTOPLASTY    . SHOULDER ARTHROSCOPY WITH OPEN ROTATOR CUFF REPAIR Right 12/05/2015   Procedure: SHOULDER ARTHROSCOPY WITH MINI OPEN ROTATOR CUFF REPAIR. DISTAL CLAVICLE EXCISION;  Surgeon: Thornton Park, MD;  Location: [facility];  Service: Orthopedics;  Laterality: Right;    Prior to Admission medications   Medication Sig Start Date End Date Taking? Authorizing Provider  acetaminophen (TYLENOL) 500 MG tablet Take 500 mg by mouth every 6 (six) hours as needed.   Yes [provider]  amLODipine (NORVASC) 5 MG tablet Take 1 tablet (5 mg total) by mouth daily. 02/24/21  Yes Einar Pheasant, MD  Ascorbic Acid (VITAMIN C) 1000 MG tablet Take 1,000 mg by mouth daily.   Yes [provider]  atorvastatin (LIPITOR) 40 MG tablet Take 1 tablet (40 mg total) by mouth daily. 12/24/20  Yes Einar Pheasant, MD  cetirizine (ZYRTEC) 10 MG tablet Take 1 tablet (10 mg total) by mouth daily. 04/14/21  Yes Einar Pheasant, MD  Cholecalciferol (VITAMIN D3) 25 MCG (1000 UT) CAPS Take 1 capsule by mouth daily.   Yes [provider]  CRANBERRY EXTRACT PO Take 1 capsule by mouth 2 (two) times daily.   Yes [provider]  D-Mannose 500 MG  CAPS Take 500 mg by mouth in the morning and at bedtime.   Yes [provider]  hydrochlorothiazide (HYDRODIURIL) 12.5 MG tablet TAKE ONE TABLET DAILY. 04/10/21  Yes Einar Pheasant, MD  losartan (COZAAR) 100 MG tablet Take 1 tablet by mouth daily. 04/10/21  Yes Einar Pheasant, MD  nystatin (MYCOSTATIN) 100000 UNIT/ML suspension 5 mls swish and spit bid x 1 week. 03/04/21  Yes Einar Pheasant, MD  polyethylene glycol (MIRALAX / GLYCOLAX) 17 g packet Take 17 g by mouth every other day.   Yes [provider]  Probiotic Product (ALIGN PO) Take by mouth as needed.   Yes [provider]  triamcinolone (NASACORT) 55 MCG/ACT nasal inhaler Place 2 sprays into the nose as needed. 03/22/13  Yes Einar Pheasant, MD  trimethoprim (TRIMPEX) 100 MG tablet Take 1 tablet (100 mg total) by mouth daily. 03/28/21  Yes McGowan, Shannon A, PA-C    Allergies as of 02/25/2021 - Review Complete 01/05/2021  Allergen Reaction Noted  . Ciprofloxacin Other (See Comments) 10/31/2015  . Zithromax [azithromycin]  11/28/2012  . Augmentin [amoxicillin-pot clavulanate] Rash 11/28/2012    Family History  Problem Relation Age of Onset  . Heart disease Mother   . Hyperlipidemia Mother   . Hypertension Mother   . Cancer Father        lung, prostate  . Hyperlipidemia Father   . Hypertension Father   . Prostate cancer Father   . Bladder Cancer Neg Hx   . Kidney cancer Neg Hx  Social History   Socioeconomic History  . Marital status: Married    Spouse name: Not on file  . Number of children: 2  . Years of education: Not on file  . Highest education level: Not on file  Occupational History  . Not on file  Tobacco Use  . Smoking status: Never Smoker  . Smokeless tobacco: Never Used  Substance and Sexual Activity  . Alcohol use: No    Alcohol/week: 0.0 standard drinks  . Drug use: No  . Sexual activity: Not Currently  Other Topics Concern  . Not on file  Social History Narrative  .  Not on file   Social Determinants of Health   Financial Resource Strain: Low Risk   . Difficulty of Paying Living Expenses: Not hard at all  Food Insecurity: No Food Insecurity  . Worried About Charity fundraiser in the Last Year: Never true  . Ran Out of Food in the Last Year: Never true  Transportation Needs: No Transportation Needs  . Lack of Transportation (Medical): No  . Lack of Transportation (Non-Medical): No  Physical Activity: Not on file  Stress: No Stress Concern Present  . Feeling of Stress : Not at all  Social Connections: Unknown  . Frequency of Communication with Friends and Family: More than three times a week  . Frequency of Social Gatherings with Friends and Family: More than three times a week  . Attends Religious Services: Not on file  . Active Member of Clubs or Organizations: Not on file  . Attends Archivist Meetings: Not on file  . Marital Status: Married  Human resources officer Violence: Not At Risk  . Fear of Current or Ex-Partner: No  . Emotionally Abused: No  . Physically Abused: No  . Sexually Abused: No    Review of Systems: See HPI, otherwise negative ROS  Physical Exam: BP 122/67   Pulse 84   Temp (!) 97.1 F (36.2 C) (Temporal)   Resp 18   Wt 56.5 kg   LMP 11/28/1998   SpO2 96%   BMI 22.77 kg/m  General:   Alert,  pleasant and cooperative in NAD Head:  Normocephalic and atraumatic. Lungs:  Clear to auscultation.    Heart:  Regular rate and rhythm.   Impression/Plan: Jamie Elliott is here for ophthalmic surgery.  Risks, benefits, limitations, and alternatives regarding ophthalmic surgery have been reviewed with the patient.  Questions have been answered.  All parties agreeable.   Leandrew Koyanagi, MD  05/07/2021, 8:35 AM

## 2021-05-08 ENCOUNTER — Encounter: Payer: Self-pay | Admitting: Ophthalmology

## 2021-05-08 ENCOUNTER — Encounter: Payer: Self-pay | Admitting: Internal Medicine

## 2021-05-09 ENCOUNTER — Other Ambulatory Visit: Payer: Self-pay | Admitting: Internal Medicine

## 2021-05-12 ENCOUNTER — Ambulatory Visit: Payer: Medicare Other | Admitting: Urology

## 2021-05-12 ENCOUNTER — Other Ambulatory Visit: Payer: Self-pay | Admitting: Internal Medicine

## 2021-05-23 ENCOUNTER — Other Ambulatory Visit: Payer: Medicare Other

## 2021-05-30 ENCOUNTER — Encounter: Payer: Medicare Other | Admitting: Internal Medicine

## 2021-06-05 ENCOUNTER — Other Ambulatory Visit: Payer: Self-pay | Admitting: Internal Medicine

## 2021-06-06 ENCOUNTER — Other Ambulatory Visit: Payer: Self-pay

## 2021-06-06 ENCOUNTER — Ambulatory Visit
Admission: RE | Admit: 2021-06-06 | Discharge: 2021-06-06 | Disposition: A | Discharge status: Home / Self Care | Payer: Medicare Other | Source: Ambulatory Visit | Attending: Urology | Admitting: Urology

## 2021-06-06 DIAGNOSIS — D1771 Benign lipomatous neoplasm of kidney: Secondary | ICD-10-CM | POA: Diagnosis not present

## 2021-06-06 DIAGNOSIS — N281 Cyst of kidney, acquired: Secondary | ICD-10-CM | POA: Diagnosis not present

## 2021-06-09 ENCOUNTER — Other Ambulatory Visit: Payer: Self-pay | Admitting: Internal Medicine

## 2021-06-16 ENCOUNTER — Other Ambulatory Visit: Payer: Self-pay

## 2021-06-16 ENCOUNTER — Telehealth: Payer: Self-pay | Admitting: Internal Medicine

## 2021-06-16 ENCOUNTER — Other Ambulatory Visit: Payer: Self-pay | Admitting: Internal Medicine

## 2021-06-16 ENCOUNTER — Telehealth (INDEPENDENT_AMBULATORY_CARE_PROVIDER_SITE_OTHER): Payer: Medicare Other | Admitting: Family Medicine

## 2021-06-16 ENCOUNTER — Encounter: Payer: Self-pay | Admitting: Family Medicine

## 2021-06-16 ENCOUNTER — Other Ambulatory Visit: Payer: Medicare Other

## 2021-06-16 VITALS — BP 132/69 | HR 96 | Ht 62.0 in | Wt 125.0 lb

## 2021-06-16 DIAGNOSIS — J3489 Other specified disorders of nose and nasal sinuses: Secondary | ICD-10-CM

## 2021-06-16 DIAGNOSIS — B349 Viral infection, unspecified: Secondary | ICD-10-CM

## 2021-06-16 MED ORDER — PROMETHAZINE-DM 6.25-15 MG/5ML PO SYRP: ORAL_SOLUTION | ORAL | 0 refills | Status: DC

## 2021-06-16 NOTE — Progress Notes (Signed)
Established Patient Office Visit  Subjective:  Patient ID: Jamie Elliott, female    DOB: 1945-12-29  Age: 75 y.o. MRN: [id number]  CC:  Chief Complaint  Patient presents with   URI    Runny nose, sinus drainage, facial pain, fever & chills    HPI Jamie Elliott presents for 1-2 day ho malaise, nausea with dry heaves, pnd, head ache, dry cough, felt warm with chills. Denies change in taste or smell. Denies wheezing,rad, or tightness in chest. Denies diarrhea.  Her son has a similar illness.  She is concerned that he husband may be coming down with the same thing.  Past Medical History:  Diagnosis Date   Environmental allergies    Hypercholesterolemia    Hypertension    Migraines     Past Surgical History:  Procedure Laterality Date   ABDOMINAL HYSTERECTOMY  2000   APPENDECTOMY  2000   CATARACT EXTRACTION W/PHACO Left 04/23/2021   Procedure: CATARACT EXTRACTION PHACO AND INTRAOCULAR LENS PLACEMENT (IOC) LEFT panoptix toric 10.41 01:22.8 12.6%;  Surgeon: Leandrew Koyanagi, MD;  Location: [place];  Service: Ophthalmology;  Laterality: Left;   CATARACT EXTRACTION W/PHACO Right 05/07/2021   Procedure: CATARACT EXTRACTION PHACO AND INTRAOCULAR LENS PLACEMENT (IOC) RIGHT  panoptix 12.85 01:33.8 13.7%;  Surgeon: Leandrew Koyanagi, MD;  Location: [place];  Service: Ophthalmology;  Laterality: Right;   NOSE SURGERY  1967   SEPTOPLASTY     SHOULDER ARTHROSCOPY WITH OPEN ROTATOR CUFF REPAIR Right 12/05/2015   Procedure: SHOULDER ARTHROSCOPY WITH MINI OPEN ROTATOR CUFF REPAIR. DISTAL CLAVICLE EXCISION;  Surgeon: Thornton Park, MD;  Location: [facility];  Service: Orthopedics;  Laterality: Right;    Family History  Problem Relation Age of Onset   Heart disease Mother    Hyperlipidemia Mother    Hypertension Mother    Cancer Father        lung, prostate   Hyperlipidemia Father    Hypertension Father    Prostate cancer Father    Bladder Cancer Neg Hx     Kidney cancer Neg Hx     Social History   Socioeconomic History   Marital status: Married    Spouse name: Not on file   Number of children: 2   Years of education: Not on file   Highest education level: Not on file  Occupational History   Not on file  Tobacco Use   Smoking status: Never   Smokeless tobacco: Never  Vaping Use   Vaping Use: Never used  Substance and Sexual Activity   Alcohol use: No    Alcohol/week: 0.0 standard drinks   Drug use: No   Sexual activity: Not Currently  Other Topics Concern   Not on file  Social History Narrative   Not on file   Social Determinants of Health   Financial Resource Strain: Low Risk    Difficulty of Paying Living Expenses: Not hard at all  Food Insecurity: No Food Insecurity   Worried About Charity fundraiser in the Last Year: Never true   [place] in the Last Year: Never true  Transportation Needs: No Transportation Needs   Lack of Transportation (Medical): No   Lack of Transportation (Non-Medical): No  Physical Activity: Not on file  Stress: No Stress Concern Present   Feeling of Stress : Not at all  Social Connections: Unknown   Frequency of Communication with Friends and Family: More than three times a week   Frequency of Social  Gatherings with Friends and Family: More than three times a week   Attends Religious Services: Not on file   Active Member of Clubs or Organizations: Not on file   Attends Archivist Meetings: Not on file   Marital Status: Married  Human resources officer Violence: Not At Risk   Fear of Current or Ex-Partner: No   Emotionally Abused: No   Physically Abused: No   Sexually Abused: No    Outpatient Medications Prior to Visit  Medication Sig Dispense Refill   acetaminophen (TYLENOL) 500 MG tablet Take 500 mg by mouth every 6 (six) hours as needed.     amLODipine (NORVASC) 5 MG tablet Take 1 tablet (5 mg total) by mouth daily. 30 tablet 0   Ascorbic Acid (VITAMIN C) 1000 MG tablet  Take 1,000 mg by mouth daily.     atorvastatin (LIPITOR) 40 MG tablet Take 1 tablet (40 mg total) by mouth daily. 90 tablet 1   cetirizine (ZYRTEC) 10 MG tablet Take 1 tablet (10 mg total) by mouth daily. 30 tablet 0   Cholecalciferol (VITAMIN D3) 25 MCG (1000 UT) CAPS Take 1 capsule by mouth daily.     CRANBERRY EXTRACT PO Take 1 capsule by mouth 2 (two) times daily.     D-Mannose 500 MG CAPS Take 500 mg by mouth in the morning and at bedtime.     hydrochlorothiazide (HYDRODIURIL) 12.5 MG tablet TAKE ONE TABLET DAILY. 90 tablet 0   losartan (COZAAR) 100 MG tablet Take 1 tablet by mouth daily. 90 tablet 0   polyethylene glycol (MIRALAX / GLYCOLAX) 17 g packet Take 17 g by mouth every other day.     Probiotic Product (ALIGN PO) Take by mouth as needed.     triamcinolone (NASACORT) 55 MCG/ACT nasal inhaler Place 2 sprays into the nose as needed. 1 Inhaler 5   trimethoprim (TRIMPEX) 100 MG tablet Take 1 tablet (100 mg total) by mouth daily. 90 tablet 0   nystatin (MYCOSTATIN) 100000 UNIT/ML suspension 5 mls swish and spit bid x 1 week. (Patient not taking: Reported on 06/16/2021) 60 mL 0   No facility-administered medications prior to visit.    Allergies  Allergen Reactions   Ciprofloxacin Other (See Comments)    Patient reports "it doesn't work for me". Last culture was sensitive, however.    Zithromax [Azithromycin]    Augmentin [Amoxicillin-Pot Clavulanate] Rash    ROS Review of Systems  Constitutional:  Positive for chills and fatigue. Negative for diaphoresis.  HENT:  Positive for congestion, postnasal drip, rhinorrhea and sneezing. Negative for sinus pressure, sinus pain and sore throat.   Respiratory:  Positive for cough. Negative for chest tightness, shortness of breath and wheezing.   Gastrointestinal:  Positive for nausea and vomiting. Negative for diarrhea.  Neurological:  Positive for headaches.     Objective:    Physical Exam Vitals and nursing note reviewed.   Constitutional:      General: She is not in acute distress.    Appearance: Normal appearance. She is not ill-appearing, toxic-appearing or diaphoretic.     Comments: Patient was dry heaving during the visit.   HENT:     Head: Normocephalic and atraumatic.  Eyes:     General:        Right eye: No discharge.        Left eye: No discharge.  Pulmonary:     Effort: Pulmonary effort is normal.  Skin:    General: Skin is warm and dry.  Neurological:     Mental Status: She is alert and oriented to person, place, and time.    BP 132/69 Comment: PT REPORTED VIDEO VISIT  Pulse 96   Ht 5\' 2"  (1.575 m)   Wt 125 lb (56.7 kg)   LMP 11/28/1998   BMI 22.86 kg/m  Wt Readings from Last 3 Encounters:  06/16/21 125 lb (56.7 kg)  05/07/21 124 lb 8 oz (56.5 kg)  04/23/21 120 lb 11.2 oz (54.7 kg)     Health Maintenance Due  Topic Date Due   Zoster Vaccines- Shingrix (1 of 2) Never done   COVID-19 Vaccine (4 - Booster for Pfizer series) 01/26/2021    There are no preventive care reminders to display for this patient.  Lab Results  Component Value Date   TSH 1.54 01/15/2021   Lab Results  Component Value Date   WBC 6.2 01/15/2021   HGB 13.1 01/15/2021   HCT 39.3 01/15/2021   MCV 92.1 01/15/2021   PLT 225.0 01/15/2021   Lab Results  Component Value Date   NA 140 01/15/2021   K 4.0 01/15/2021   CO2 29 01/15/2021   GLUCOSE 85 01/15/2021   BUN 18 01/15/2021   CREATININE 0.84 01/15/2021   BILITOT 0.6 01/15/2021   ALKPHOS 94 01/15/2021   AST 19 01/15/2021   ALT 22 01/15/2021   PROT 6.4 01/15/2021   ALBUMIN 4.0 01/15/2021   CALCIUM 10.2 01/15/2021   ANIONGAP 9 09/19/2015   GFR 68.19 01/15/2021   Lab Results  Component Value Date   CHOL 180 01/15/2021   Lab Results  Component Value Date   HDL 63.20 01/15/2021   Lab Results  Component Value Date   LDLCALC 92 01/15/2021   Lab Results  Component Value Date   TRIG 124.0 01/15/2021   Lab Results  Component Value  Date   CHOLHDL 3 01/15/2021   No results found for: HGBA1C    Assessment & Plan:   Problem List Items Addressed This Visit   None Visit Diagnoses     Viral syndrome    -  Primary   Relevant Medications   promethazine-dextromethorphan (PROMETHAZINE-DM) 6.25-15 MG/5ML syrup       Meds ordered this encounter  Medications   promethazine-dextromethorphan (PROMETHAZINE-DM) 6.25-15 MG/5ML syrup    Sig: May take one tsp every 8 hours as needed for cough and nausea.    Dispense:  118 mL    Refill:  0    Follow-up: No follow-ups on file.   Will schedule low PCR COVID test through her pharmacy.  Let me know directly if it is positive.  Otherwise she will schedule an appointment for follow-up with her primary doctor next week.  Drowsy precautions for cough syrup. Libby Maw, MD  Virtual Visit via Video Note  I connected with Caroline More on 06/16/21 at 11:30 AM EDT by a video enabled telemedicine application and verified that I am speaking with the correct person using two identifiers.  Location: Patient: home with husband.  Provider: work   I discussed the limitations of evaluation and management by telemedicine and the availability of in person appointments. The patient expressed understanding and agreed to proceed.  History of Present Illness:    Observations/Objective:   Assessment and Plan:   Follow Up Instructions:    I discussed the assessment and treatment plan with the patient. The patient was provided an opportunity to ask questions and all were answered. The patient agreed with the plan and demonstrated  an understanding of the instructions.   The patient was advised to call back or seek an in-person evaluation if the symptoms worsen or if the condition fails to improve as anticipated.  I provided 25 minutes of non-face-to-face time during this encounter.   Libby Maw, MD

## 2021-06-16 NOTE — Addendum Note (Signed)
Addended by: Lars Masson on: 06/16/2021 01:57 PM   Modules accepted: Orders

## 2021-06-16 NOTE — Telephone Encounter (Signed)
I have placed the order for the COVID swab. I am going to swab her. Can we work her in virtually next Tuesday or Wednesday

## 2021-06-16 NOTE — Telephone Encounter (Signed)
Ok to work in next Tuesday.  Need to confirm pt doing ok.

## 2021-06-16 NOTE — Telephone Encounter (Signed)
Patient called back, she can not find a place to get covid tested today. She would like to come to the back of out office building to get one. Patient waiting for Larena Glassman to call her back.

## 2021-06-16 NOTE — Telephone Encounter (Signed)
Pt saw Dr. Ethelene Hal today and she needs a 1wk follow up VV No appts available

## 2021-06-17 ENCOUNTER — Encounter: Payer: Self-pay | Admitting: Internal Medicine

## 2021-06-17 LAB — NOVEL CORONAVIRUS, NAA: SARS-CoV-2, NAA: DETECTED — AB

## 2021-06-17 LAB — SARS-COV-2, NAA 2 DAY TAT

## 2021-06-17 NOTE — Telephone Encounter (Signed)
Confirmed patient doing ok. Has been scheduled for 1 week follow up. Still waiting for COVID results. Patient is aware results still pending.

## 2021-06-18 ENCOUNTER — Encounter: Payer: Self-pay | Admitting: Family

## 2021-06-18 ENCOUNTER — Other Ambulatory Visit
Admission: RE | Admit: 2021-06-18 | Discharge: 2021-06-18 | Disposition: A | Discharge status: Home / Self Care | Payer: Medicare Other | Attending: Family | Admitting: Family

## 2021-06-18 ENCOUNTER — Telehealth: Payer: Self-pay

## 2021-06-18 ENCOUNTER — Telehealth (INDEPENDENT_AMBULATORY_CARE_PROVIDER_SITE_OTHER): Payer: Medicare Other | Admitting: Family

## 2021-06-18 ENCOUNTER — Other Ambulatory Visit: Payer: Self-pay

## 2021-06-18 VITALS — BP 118/64 | HR 78 | Ht 62.0 in | Wt 124.0 lb

## 2021-06-18 DIAGNOSIS — U071 COVID-19: Secondary | ICD-10-CM

## 2021-06-18 DIAGNOSIS — Z8616 Personal history of COVID-19: Secondary | ICD-10-CM | POA: Insufficient documentation

## 2021-06-18 LAB — BASIC METABOLIC PANEL
Anion gap: 8 (ref 5–15)
BUN: 15 mg/dL (ref 8–23)
CO2: 27 mmol/L (ref 22–32)
Calcium: 9.5 mg/dL (ref 8.9–10.3)
Chloride: 101 mmol/L (ref 98–111)
Creatinine, Ser: 0.99 mg/dL (ref 0.44–1.00)
GFR, Estimated: 59 mL/min — ABNORMAL LOW (ref >60)
Glucose, Bld: 134 mg/dL — ABNORMAL HIGH (ref 70–99)
Potassium: 3.1 mmol/L — ABNORMAL LOW (ref 3.5–5.1)
Sodium: 136 mmol/L (ref 135–145)

## 2021-06-18 MED ORDER — NIRMATRELVIR/RITONAVIR (PAXLOVID)TABLET: 3.0000 | ORAL_TABLET | Freq: Two times a day (BID) | ORAL | 0 refills | Status: AC

## 2021-06-18 MED ORDER — NIRMATRELVIR/RITONAVIR (PAXLOVID)TABLET: 3.0000 | ORAL_TABLET | Freq: Two times a day (BID) | ORAL | 0 refills | Status: DC

## 2021-06-18 NOTE — Progress Notes (Signed)
Virtual Visit via Video Note  I connected with@  on 06/18/21 at 11:00 AM EDT by a video enabled telemedicine application and verified that I am speaking with the correct person using two identifiers.  Location patient: home Location provider:work  Persons participating in the virtual visit: patient, provider  I discussed the limitations of evaluation and management by telemedicine and the availability of in person appointments. The patient expressed understanding and agreed to proceed.  Interactive audio and video telecommunications were attempted between this provider and patient, however failed, due to patient having technical difficulties or patient did not have access to video capability.  We continued and completed visit with audio only.   HPI: Covid positive 3 days ago.   Complains of nasal congestion, cough, sore throat x 4 days ago  Sore throat is most bothersome.  Nausea has resolved. No diarrhea, vomiting, sob, wheezing, fever.   She has started promethazine DM, as started by Dr Nicki Reaper, and she thinks contributed to nausea.   Has been taking tylenol with relief.   H/o htn, recurrent UTI ( she is on trimethoprim 118m qd)   No  h/o lung disease   Covid vaccinated with booster   BP 118/64, HR 78 during visit today    ROS: See pertinent positives and negatives per HPI.    EXAM:  VITALS per patient if applicable: BP 1149/70  Pulse 78   Ht '5\' 2"'  (1.575 m)   Wt 124 lb (56.2 kg)   LMP 11/28/1998   BMI 22.68 kg/m  BP Readings from Last 3 Encounters:  06/18/21 118/64  06/16/21 132/69  05/07/21 108/73   Wt Readings from Last 3 Encounters:  06/18/21 124 lb (56.2 kg)  06/16/21 125 lb (56.7 kg)  05/07/21 124 lb 8 oz (56.5 kg)     ASSESSMENT AND PLAN:  Discussed the following assessment and plan:  Problem List Items Addressed This Visit       Other   COVID-19 - Primary    Counseled on lacking long term safely and effectiveness data of medication,  Paxlovid. Explained EUA for Paxlovid. Criteria met for consideration of Paxlovid,  patient older than 12 years and weight > 40kg, started within 5 days of symptom onset and risk factor for severe disease include: HTN, age > 621 Vaccine status: vaccinated with booster GFR > 60.  Counseled on adverse effects including altered taste, diarrhea, HTN, and myalgia.   Hold lipitor , decrease amlodipine to 2.544m.  Patient is most comfortable and desires to start Paxlovid and understands to call me with concerns or new symptoms.         Relevant Medications   nirmatrelvir/ritonavir EUA (PAXLOVID) TABS   Other Relevant Orders   Basic metabolic panel    -we discussed possible serious and likely etiologies, options for evaluation and workup, limitations of telemedicine visit vs in person visit, treatment, treatment risks and precautions. Pt prefers to treat via telemedicine empirically rather then risking or undertaking an in person visit at this moment.  .   I discussed the assessment and treatment plan with the patient. The patient was provided an opportunity to ask questions and all were answered. The patient agreed with the plan and demonstrated an understanding of the instructions.   The patient was advised to call back or seek an in-person evaluation if the symptoms worsen or if the condition fails to improve as anticipated.  I have spent 16 minutes with a patient including precharting, exam, reviewing medical records,  and discussion plan of care.      Mable Paris, FNP

## 2021-06-18 NOTE — Patient Instructions (Addendum)
Hold lipitor while on paxlovid.   Decrease amlodipine to 2.5mg  . Monitor blood pressure.   We discussed starting Paxlovid which is an unapproved drug that is authorized for use under an Emergency Use Authorization.  There are no adequate, approved, available products for the treatment of COVID-19 in adults who have mild-to-moderate COVID-19 and are at high risk for progressing to severe COVID-19, including hospitalization or death.  There are benefits and risks of taking this treatment as outlined in the "Fact Sheet for Patients and Caregivers." You may find this document here and please read in detail   [email or web address]   I have sent Paxlovid to your pharmacy. Please call pharmacy so they bring medication out to your car and you do not have to go inside.   PAXLOVID ADMINISTRATION INSTRUCTIONS:  Take with or without food. Swallow the tablets whole. Don't chew, crush, or break the medications because it might not work as well  For each dose of the medication, you should be taking 3 tablets together (2 pink oval and 1 white oval) TWICE a day for FIVE days   Finish your full five-day course of Paxlovid even if you feel better before you're done. Stopping this medication too early can make it less effective to prevent severe illness related to [place].    Paxlovid is prescribed for YOU ONLY. Don't share it with others, even if they have similar symptoms as you. This medication might not be right for everyone.  Make sure to take steps to protect yourself and others while you're taking this medication in order to get well soon and to prevent others from getting sick with COVID-19.  Paxlovid (nirmatrelvir / ritonavir) can cause hormonal birth control medications to not work well. If you or your partner is currently taking hormonal birth control, use condoms or other birth control methods to prevent unintended pregnancies.   COMMON SIDE EFFECTS: Altered or bad taste in  your mouth  Diarrhea  High blood pressure (1% of people) Muscle aches (1% of people)    If your COVID-19 symptoms get worse, get medical help right away. Call 911 if you experience symptoms such as worsening cough, trouble breathing, chest pain that doesn't go away, confusion, a hard time staying awake, and pale or blue-colored skin.This medication won't prevent all COVID-19 cases from getting worse.

## 2021-06-18 NOTE — Assessment & Plan Note (Addendum)
Counseled on lacking long term safely and effectiveness data of medication, Paxlovid. Explained EUA for Paxlovid. Criteria met for consideration of Paxlovid,  patient older than 12 years and weight > 40kg, started within 5 days of symptom onset and risk factor for severe disease include: HTN, age > 25  Vaccine status: vaccinated with booster GFR > 60.  Counseled on adverse effects including altered taste, diarrhea, HTN, and myalgia.   I have advised to hold lipitor for 5 days , decrease amlodipine to 2.75m and monitor blood pressure while on Paxlovid.   Patient is most comfortable and desires to start Paxlovid and understands to call me with concerns or new symptoms.

## 2021-06-18 NOTE — Telephone Encounter (Signed)
See result note.  

## 2021-06-18 NOTE — Telephone Encounter (Signed)
I called and let patient know that I had resent Paxlovid to [facility] as requested. She was on her way to [facility] lab to have CMP drawn.

## 2021-06-18 NOTE — Telephone Encounter (Signed)
[facility] N[facility]rel[facility]-Ritonavir -please send to [facility]

## 2021-06-19 ENCOUNTER — Other Ambulatory Visit: Payer: Self-pay

## 2021-06-19 DIAGNOSIS — E876 Hypokalemia: Secondary | ICD-10-CM

## 2021-06-19 NOTE — Telephone Encounter (Signed)
Pt.notified

## 2021-06-19 NOTE — Telephone Encounter (Signed)
Pt asked to have a call back from you

## 2021-06-24 ENCOUNTER — Encounter: Payer: Self-pay | Admitting: Internal Medicine

## 2021-06-24 ENCOUNTER — Other Ambulatory Visit: Payer: Self-pay | Admitting: Urology

## 2021-06-24 ENCOUNTER — Telehealth (INDEPENDENT_AMBULATORY_CARE_PROVIDER_SITE_OTHER): Payer: Medicare Other | Admitting: Internal Medicine

## 2021-06-24 DIAGNOSIS — R928 Other abnormal and inconclusive findings on diagnostic imaging of breast: Secondary | ICD-10-CM

## 2021-06-24 DIAGNOSIS — I10 Essential (primary) hypertension: Secondary | ICD-10-CM | POA: Diagnosis not present

## 2021-06-24 DIAGNOSIS — N390 Urinary tract infection, site not specified: Secondary | ICD-10-CM

## 2021-06-24 DIAGNOSIS — U071 COVID-19: Secondary | ICD-10-CM

## 2021-06-24 NOTE — Progress Notes (Signed)
Patient ID: Jamie Elliott, female   DOB: Jun 25, 1946, 75 y.o.   MRN: [id number]   Virtual Visit via telephone Note  This visit type was conducted due to national recommendations for restrictions regarding the COVID-19 pandemic (e.g. social distancing).  This format is felt to be most appropriate for this patient at this time.  All issues noted in this document were discussed and addressed.  No physical exam was performed (except for noted visual exam findings with Video Visits).   I connected with Jamie Elliott by telephone and verified that I am speaking with the correct person using two identifiers. Location patient: home Location provider: work  Persons participating in the telephone visit: patient, provider  The limitations, risks, security and privacy concerns of performing an evaluation and management service by telephone and the availability of in person appointments have  I also discussed with the patient that there may be a patient responsible charge related to this service. The patient expressed understanding and agreed to proceed.   Reason for visit: work in appt.    HPI: Follow up - covid positive.  Was evaluated by Mable Paris 05/2921.  Pr note, symptoms started 06/15/21.  Symptoms - fatigue, headache, "face aching", nausea and some vomiting.  Treated with paxlovid.  Finished yesterday.  Reports is feeling better.  No fever.  No headache.  No sinus pressure or sore throat.  No cough.  Occasional drainage.  No nausea or vomiting.  Had very minimal diarrhea.  No shortness of breath, chest pain or chest tightness.  She does report she has noticed a little metallic taste.  She is eating.  Discussed quarantine guidelines.  She is s/p cataract surgery.  Recently evaluated for abnormal mammogram.  Request referral to [facility] - Dr Stasia Cavalier.  No breast pain or change that she has noticed.     ROS: See pertinent positives and negatives per HPI.  Past Medical History:  Diagnosis Date    Environmental allergies    Hypercholesterolemia    Hypertension    Migraines     Past Surgical History:  Procedure Laterality Date   ABDOMINAL HYSTERECTOMY  2000   APPENDECTOMY  2000   CATARACT EXTRACTION W/PHACO Left 04/23/2021   Procedure: CATARACT EXTRACTION PHACO AND INTRAOCULAR LENS PLACEMENT (IOC) LEFT panoptix toric 10.41 01:22.8 12.6%;  Surgeon: Leandrew Koyanagi, MD;  Location: [place];  Service: Ophthalmology;  Laterality: Left;   CATARACT EXTRACTION W/PHACO Right 05/07/2021   Procedure: CATARACT EXTRACTION PHACO AND INTRAOCULAR LENS PLACEMENT (IOC) RIGHT  panoptix 12.85 01:33.8 13.7%;  Surgeon: Leandrew Koyanagi, MD;  Location: [place];  Service: Ophthalmology;  Laterality: Right;   NOSE SURGERY  1967   SEPTOPLASTY     SHOULDER ARTHROSCOPY WITH OPEN ROTATOR CUFF REPAIR Right 12/05/2015   Procedure: SHOULDER ARTHROSCOPY WITH MINI OPEN ROTATOR CUFF REPAIR. DISTAL CLAVICLE EXCISION;  Surgeon: Thornton Park, MD;  Location: [facility];  Service: Orthopedics;  Laterality: Right;    Family History  Problem Relation Age of Onset   Heart disease Mother    Hyperlipidemia Mother    Hypertension Mother    Cancer Father        lung, prostate   Hyperlipidemia Father    Hypertension Father    Prostate cancer Father    Bladder Cancer Neg Hx    Kidney cancer Neg Hx     SOCIAL HX: reviewed.    Current Outpatient Medications:    acetaminophen (TYLENOL) 500 MG tablet, Take 500 mg by mouth every 6 (six)  hours as needed., Disp: , Rfl:    amLODipine (NORVASC) 5 MG tablet, Take 1 tablet (5 mg total) by mouth daily., Disp: 30 tablet, Rfl: 0   Ascorbic Acid (VITAMIN C) 1000 MG tablet, Take 1,000 mg by mouth daily., Disp: , Rfl:    atorvastatin (LIPITOR) 40 MG tablet, Take 1 tablet (40 mg total) by mouth daily., Disp: 90 tablet, Rfl: 0   cetirizine (ZYRTEC) 10 MG tablet, Take 1 tablet (10 mg total) by mouth daily., Disp: 30 tablet, Rfl: 0   Cholecalciferol  (VITAMIN D3) 25 MCG (1000 UT) CAPS, Take 1 capsule by mouth daily., Disp: , Rfl:    CRANBERRY EXTRACT PO, Take 1 capsule by mouth 2 (two) times daily., Disp: , Rfl:    D-Mannose 500 MG CAPS, Take 500 mg by mouth in the morning and at bedtime., Disp: , Rfl:    hydrochlorothiazide (HYDRODIURIL) 12.5 MG tablet, TAKE ONE TABLET DAILY., Disp: 90 tablet, Rfl: 0   losartan (COZAAR) 100 MG tablet, Take 1 tablet by mouth daily., Disp: 90 tablet, Rfl: 0   polyethylene glycol (MIRALAX / GLYCOLAX) 17 g packet, Take 17 g by mouth every other day., Disp: , Rfl:    Probiotic Product (ALIGN PO), Take by mouth as needed., Disp: , Rfl:    promethazine-dextromethorphan (PROMETHAZINE-DM) 6.25-15 MG/5ML syrup, May take one tsp every 8 hours as needed for cough and nausea., Disp: 118 mL, Rfl: 0   triamcinolone (NASACORT) 55 MCG/ACT nasal inhaler, Place 2 sprays into the nose as needed., Disp: 1 Inhaler, Rfl: 5   trimethoprim (TRIMPEX) 100 MG tablet, Take 1 tablet (100 mg total) by mouth daily., Disp: 90 tablet, Rfl: 0  EXAM:  GENERAL: alert. Sounds to be in no acute distress.  Answering questions appropriately.    PSYCH/NEURO: pleasant and cooperative, no obvious depression or anxiety, speech and thought processing grossly intact  ASSESSMENT AND PLAN:  Discussed the following assessment and plan:  Problem List Items Addressed This Visit     Abnormal mammogram    Saw Dr Bary Castilla.  Recommended f/u left breast diagnostic mammogram.  She requested referral to Dr Stasia Cavalier.  No problems reported with her breast.         Relevant Orders   Ambulatory referral to General Surgery   COVID-19    Completed paxlovid.  Doing better.  Feels better.  No sinus congestion or chest congestion. No cough or sob.  Eating.  Minimal diarrhea.  Probiotics as directed.  Rest.  Follow.  Discussed quarantine guidelines.         Hypertension    Continue amlodipine and hctz.  Follow pressures.  Follow metabolic panel.           Return in about 2 months (around 08/25/2021) for physical.   I discussed the assessment and treatment plan with the patient. The patient was provided an opportunity to ask questions and all were answered. The patient agreed with the plan and demonstrated an understanding of the instructions.   The patient was advised to call back or seek an in-person evaluation if the symptoms worsen or if the condition fails to improve as anticipated.  I provided 23 minutes of non-face-to-face time during this encounter.   Einar Pheasant, MD

## 2021-06-26 ENCOUNTER — Encounter: Payer: Self-pay | Admitting: Internal Medicine

## 2021-06-26 DIAGNOSIS — R928 Other abnormal and inconclusive findings on diagnostic imaging of breast: Secondary | ICD-10-CM | POA: Insufficient documentation

## 2021-06-26 NOTE — Assessment & Plan Note (Signed)
Continue amlodipine and hctz.  Follow pressures.  Follow metabolic panel.  

## 2021-06-26 NOTE — Progress Notes (Addendum)
06/27/2021 9:12 AM   Jamie Elliott 15-Apr-1946 [id number]  Referring provider: Einar Pheasant, [address] [place],  [place] [address]  Urological history: 1. rUTI's -Risk factors: age, vaginal atrophy and constipation -KUB 12/2017 constipation.  Cystoscopy performed on 02/02/2018 by Dr. Erlene Quan noted subtle trigonitis but no significant pathology.   -documented positive urine cultures over the last year  + E.coli resistant ampicillin, tetracycline nitrofurantoin 100 mg x 5 day 04/29/2020  + E.coli pan-sensitive Septra DS x 7 days 10/28/2020  + E.coli pan-sensitive Levaquin 750 mg x 5 days 12/18/2020  2. Angiomyolipoma -RUS 11/13/2020 ~ 2 cm cyst in the left kidney and a ~ 1 cm hyperechoic lesion in the right likely an angiomyolipoma -RUS 05/2021 - Stable 0.8 cm hyperechoic structure within the right renal pelvis, which could reflect a nonshadowing calculus or small angiomyolipoma.  Simple left renal cyst.  3. Vaginal atrophy -cervix, uterus and ovaries removed in 2000.  No malignancies noted -abnormality found in left breast on 02/2021 mammogram/breast ultrasound-currently under surveillance   Chief Complaint  Patient presents with   Recurrent UTI    HPI: Jamie Elliott is a 75 y.o. female who presents today for a three months follow up after being placed on suppressive antibiotics (trimethoprim).   RUS 2022 - unchanged angiomyolipoma    PMH: Past Medical History:  Diagnosis Date   Environmental allergies    Hypercholesterolemia    Hypertension    Migraines     Surgical History: Past Surgical History:  Procedure Laterality Date   ABDOMINAL HYSTERECTOMY  2000   APPENDECTOMY  2000   CATARACT EXTRACTION W/PHACO Left 04/23/2021   Procedure: CATARACT EXTRACTION PHACO AND INTRAOCULAR LENS PLACEMENT (IOC) LEFT panoptix toric 10.41 01:22.8 12.6%;  Surgeon: Leandrew Koyanagi, MD;  Location: [place];  Service: Ophthalmology;   Laterality: Left;   CATARACT EXTRACTION W/PHACO Right 05/07/2021   Procedure: CATARACT EXTRACTION PHACO AND INTRAOCULAR LENS PLACEMENT (IOC) RIGHT  panoptix 12.85 01:33.8 13.7%;  Surgeon: Leandrew Koyanagi, MD;  Location: [place];  Service: Ophthalmology;  Laterality: Right;   NOSE SURGERY  1967   SEPTOPLASTY     SHOULDER ARTHROSCOPY WITH OPEN ROTATOR CUFF REPAIR Right 12/05/2015   Procedure: SHOULDER ARTHROSCOPY WITH MINI OPEN ROTATOR CUFF REPAIR. DISTAL CLAVICLE EXCISION;  Surgeon: Thornton Park, MD;  Location: [facility];  Service: Orthopedics;  Laterality: Right;    Home Medications:  Current Outpatient Medications on File Prior to Visit  Medication Sig Dispense Refill   acetaminophen (TYLENOL) 500 MG tablet Take 500 mg by mouth every 6 (six) hours as needed.     amLODipine (NORVASC) 5 MG tablet Take 1 tablet (5 mg total) by mouth daily. 30 tablet 0   Ascorbic Acid (VITAMIN C) 1000 MG tablet Take 1,000 mg by mouth daily.     atorvastatin (LIPITOR) 40 MG tablet Take 1 tablet (40 mg total) by mouth daily. 90 tablet 0   cetirizine (ZYRTEC) 10 MG tablet Take 1 tablet (10 mg total) by mouth daily. 30 tablet 0   Cholecalciferol (VITAMIN D3) 25 MCG (1000 UT) CAPS Take 1 capsule by mouth daily.     CRANBERRY EXTRACT PO Take 1 capsule by mouth 2 (two) times daily.     D-Mannose 500 MG CAPS Take 500 mg by mouth in the morning and at bedtime.     hydrochlorothiazide (HYDRODIURIL) 12.5 MG tablet TAKE ONE TABLET DAILY. 90 tablet 0   losartan (COZAAR) 100 MG tablet Take 1 tablet by  mouth daily. 90 tablet 0   polyethylene glycol (MIRALAX / GLYCOLAX) 17 g packet Take 17 g by mouth every other day.     Probiotic Product (ALIGN PO) Take by mouth as needed.     promethazine-dextromethorphan (PROMETHAZINE-DM) 6.25-15 MG/5ML syrup May take one tsp every 8 hours as needed for cough and nausea. 118 mL 0   triamcinolone (NASACORT) 55 MCG/ACT nasal inhaler Place 2 sprays into the nose as needed. 1  Inhaler 5   trimethoprim (TRIMPEX) 100 MG tablet Take 1 tablet (100 mg total) by mouth daily. 90 tablet 0   No current facility-administered medications on file prior to visit.    Allergies:  Allergies  Allergen Reactions   Ciprofloxacin Other (See Comments)    Patient reports "it doesn't work for me". Last culture was sensitive, however.    Zithromax [Azithromycin]    Augmentin [Amoxicillin-Pot Clavulanate] Rash    Family History: Family History  Problem Relation Age of Onset   Heart disease Mother    Hyperlipidemia Mother    Hypertension Mother    Cancer Father        lung, prostate   Hyperlipidemia Father    Hypertension Father    Prostate cancer Father    Bladder Cancer Neg Hx    Kidney cancer Neg Hx     Social History:  reports that she has never smoked. She has never used smokeless tobacco. She reports that she does not drink alcohol and does not use drugs.  ROS: Pertinent ROS in HPI  Physical Exam: BP 132/76   Pulse (!) 109   Ht 5\' 2"  (1.575 m)   Wt 124 lb (56.2 kg)   LMP 11/28/1998   BMI 22.68 kg/m   Constitutional:  Well nourished. Alert and oriented, No acute distress. HEENT: [place] AT, mask in place.  Trachea midline Cardiovascular: No clubbing, cyanosis, or edema. Respiratory: Normal respiratory effort, no increased work of breathing. Neurologic: Grossly intact, no focal deficits, moving all 4 extremities. Psychiatric: Normal mood and affect.     Laboratory Data: No new data since last visit   Pertinent Imaging: CLINICAL DATA:  Right renal angiomyolipoma   EXAM: RENAL / URINARY TRACT ULTRASOUND COMPLETE   COMPARISON:  11/13/2020   FINDINGS: Right Kidney:   Renal measurements: 8.4 x 4.1 x 4.4 cm = volume: 79.1 mL. There is normal renal cortical echotexture. Hyperechoic structure in the mid right renal pelvis measures 0.6 x 0.8 x 0.8 cm, with no posterior acoustic shadowing. This is unchanged since prior exam. No hydronephrosis.   Left  Kidney:   Renal measurements: 10.0 by 4.4 x 4.7 cm = volume: 109.8 mL. Normal renal cortical echotexture. Exophytic cyst upper pole measuring 2.2 x 2.5 x 1.9 cm. No hydronephrosis or renal mass.   Bladder:   Appears normal for degree of bladder distention.   Other:   None.   IMPRESSION: 1. Stable 0.8 cm hyperechoic structure within the right renal pelvis, which could reflect a nonshadowing calculus or small angiomyolipoma. 2. Simple left renal cyst.     Electronically Signed   By: Randa Ngo M.D.   On: 06/07/2021 01:32  I have independently reviewed the films.  See HPI.     Assessment & Plan:    1. rUTI's -No breakthrough infections while on trimethoprim suppression -she will discontinue the suppressive therapy -she will contact us with any UTI symptoms  2. Vaginal atrophy -discontinue Estrace cream due to abnormal finding on mammogram and breast ultrasound  3. Right renal angiomyolipoma - will obtain a RUS in 12 months (June 2023) to evaluate stability - ending surveillance in 2026  Return in about 1 year (around 06/27/2022) for RUS .  These notes generated with voice recognition software. I apologize for typographical errors.  Zara Council, PA-C  [facility] [address]  [place], [place] [address] [phone]

## 2021-06-26 NOTE — Assessment & Plan Note (Signed)
Saw Dr Bary Castilla.  Recommended f/u left breast diagnostic mammogram.  She requested referral to Dr Stasia Cavalier.  No problems reported with her breast.

## 2021-06-26 NOTE — Assessment & Plan Note (Signed)
Completed paxlovid.  Doing better.  Feels better.  No sinus congestion or chest congestion. No cough or sob.  Eating.  Minimal diarrhea.  Probiotics as directed.  Rest.  Follow.  Discussed quarantine guidelines.

## 2021-06-27 ENCOUNTER — Other Ambulatory Visit: Payer: Self-pay

## 2021-06-27 ENCOUNTER — Other Ambulatory Visit
Admission: RE | Admit: 2021-06-27 | Discharge: 2021-06-27 | Disposition: A | Discharge status: Home / Self Care | Payer: Medicare Other | Attending: Family | Admitting: Family

## 2021-06-27 ENCOUNTER — Ambulatory Visit (INDEPENDENT_AMBULATORY_CARE_PROVIDER_SITE_OTHER): Payer: Medicare Other | Admitting: Urology

## 2021-06-27 ENCOUNTER — Encounter: Payer: Self-pay | Admitting: Urology

## 2021-06-27 VITALS — BP 132/76 | HR 109 | Ht 62.0 in | Wt 124.0 lb

## 2021-06-27 DIAGNOSIS — N390 Urinary tract infection, site not specified: Secondary | ICD-10-CM | POA: Diagnosis not present

## 2021-06-27 DIAGNOSIS — D1771 Benign lipomatous neoplasm of kidney: Secondary | ICD-10-CM | POA: Diagnosis not present

## 2021-06-27 DIAGNOSIS — N952 Postmenopausal atrophic vaginitis: Secondary | ICD-10-CM | POA: Diagnosis not present

## 2021-06-27 DIAGNOSIS — E876 Hypokalemia: Secondary | ICD-10-CM | POA: Diagnosis not present

## 2021-06-27 LAB — BASIC METABOLIC PANEL
Anion gap: 5 (ref 5–15)
BUN: 14 mg/dL (ref 8–23)
CO2: 29 mmol/L (ref 22–32)
Calcium: 9.5 mg/dL (ref 8.9–10.3)
Chloride: 102 mmol/L (ref 98–111)
Creatinine, Ser: 0.77 mg/dL (ref 0.44–1.00)
GFR, Estimated: >60 mL/min (ref >60)
Glucose, Bld: 81 mg/dL (ref 70–99)
Potassium: 3.4 mmol/L — ABNORMAL LOW (ref 3.5–5.1)
Sodium: 136 mmol/L (ref 135–145)

## 2021-07-02 ENCOUNTER — Other Ambulatory Visit: Payer: Self-pay | Admitting: Internal Medicine

## 2021-07-04 DIAGNOSIS — R928 Other abnormal and inconclusive findings on diagnostic imaging of breast: Secondary | ICD-10-CM | POA: Diagnosis not present

## 2021-07-07 ENCOUNTER — Other Ambulatory Visit: Payer: Self-pay | Admitting: Internal Medicine

## 2021-07-07 DIAGNOSIS — R928 Other abnormal and inconclusive findings on diagnostic imaging of breast: Secondary | ICD-10-CM | POA: Diagnosis not present

## 2021-07-29 ENCOUNTER — Other Ambulatory Visit: Payer: Self-pay | Admitting: Internal Medicine

## 2021-08-04 ENCOUNTER — Other Ambulatory Visit: Payer: Self-pay | Admitting: General Surgery

## 2021-08-04 ENCOUNTER — Other Ambulatory Visit: Payer: Self-pay | Admitting: Internal Medicine

## 2021-08-04 DIAGNOSIS — Z87898 Personal history of other specified conditions: Secondary | ICD-10-CM

## 2021-08-15 ENCOUNTER — Other Ambulatory Visit: Payer: Self-pay

## 2021-08-15 ENCOUNTER — Other Ambulatory Visit (INDEPENDENT_AMBULATORY_CARE_PROVIDER_SITE_OTHER): Payer: Medicare Other

## 2021-08-15 ENCOUNTER — Other Ambulatory Visit: Payer: Medicare Other

## 2021-08-15 DIAGNOSIS — E7800 Pure hypercholesterolemia, unspecified: Secondary | ICD-10-CM | POA: Diagnosis not present

## 2021-08-15 LAB — BASIC METABOLIC PANEL
BUN: 11 mg/dL (ref 6–23)
CO2: 29 mEq/L (ref 19–32)
Calcium: 9.9 mg/dL (ref 8.4–10.5)
Chloride: 104 mEq/L (ref 96–112)
Creatinine, Ser: 0.79 mg/dL (ref 0.40–1.20)
GFR: 73.11 mL/min (ref >60.00)
Glucose, Bld: 75 mg/dL (ref 70–99)
Potassium: 3.9 mEq/L (ref 3.5–5.1)
Sodium: 140 mEq/L (ref 135–145)

## 2021-08-15 LAB — HEPATIC FUNCTION PANEL
ALT: 17 U/L (ref 0–35)
AST: 19 U/L (ref 0–37)
Albumin: 4 g/dL (ref 3.5–5.2)
Alkaline Phosphatase: 77 U/L (ref 39–117)
Bilirubin, Direct: 0.1 mg/dL (ref 0.0–0.3)
Total Bilirubin: 0.5 mg/dL (ref 0.2–1.2)
Total Protein: 6.2 g/dL (ref 6.0–8.3)

## 2021-08-15 LAB — LIPID PANEL
Cholesterol: 168 mg/dL (ref 0–200)
HDL: 56.1 mg/dL (ref >39.00)
LDL Cholesterol: 84 mg/dL (ref 0–99)
NonHDL: 112.04
Total CHOL/HDL Ratio: 3
Triglycerides: 138 mg/dL (ref 0.0–149.0)
VLDL: 27.6 mg/dL (ref 0.0–40.0)

## 2021-08-19 ENCOUNTER — Other Ambulatory Visit: Payer: Self-pay | Admitting: General Surgery

## 2021-08-19 DIAGNOSIS — Z87898 Personal history of other specified conditions: Secondary | ICD-10-CM

## 2021-08-20 ENCOUNTER — Telehealth (INDEPENDENT_AMBULATORY_CARE_PROVIDER_SITE_OTHER): Payer: Medicare Other | Admitting: Internal Medicine

## 2021-08-20 DIAGNOSIS — Z8616 Personal history of COVID-19: Secondary | ICD-10-CM

## 2021-08-20 DIAGNOSIS — E7800 Pure hypercholesterolemia, unspecified: Secondary | ICD-10-CM

## 2021-08-20 DIAGNOSIS — F439 Reaction to severe stress, unspecified: Secondary | ICD-10-CM

## 2021-08-20 DIAGNOSIS — D219 Benign neoplasm of connective and other soft tissue, unspecified: Secondary | ICD-10-CM | POA: Diagnosis not present

## 2021-08-20 DIAGNOSIS — Z20822 Contact with and (suspected) exposure to covid-19: Secondary | ICD-10-CM

## 2021-08-20 DIAGNOSIS — Z87898 Personal history of other specified conditions: Secondary | ICD-10-CM

## 2021-08-20 DIAGNOSIS — I10 Essential (primary) hypertension: Secondary | ICD-10-CM | POA: Diagnosis not present

## 2021-08-20 NOTE — Progress Notes (Signed)
Patient ID: Jamie Elliott, female   DOB: [birth year], 75 y.o.   MRN: [id number]   Virtual Visit via video Note  This visit type was conducted due to national recommendations for restrictions regarding the COVID-19 pandemic (e.g. social distancing).  This format is felt to be most appropriate for this patient at this time.  All issues noted in this document were discussed and addressed.  No physical exam was performed (except for noted visual exam findings with Video Visits).   I connected with [name] by a video enabled telemedicine application and verified that I am speaking with the correct person using two identifiers. Location patient: home Location provider: work  Persons participating in the virtual visit: patient, provider  The limitations, risks, security and privacy concerns of performing an evaluation and management service by video and the availability of in person appointments have been discussed.  It has also been discussed with the patient that there may be a patient responsible charge related to this service. The patient expressed understanding and agreed to proceed.   Reason for visit: scheduled follow up.   HPI: Recently had covid - symptoms started 06/15/21.  Some residual fatigue after, but feels back to her baseline now.  Her son was recently exposed at work.  Discussed testing and quarantine.  She is without symptoms. Specifically denies any chest congestion, cough or sob.  No fever or sore throat.  No chest pain or tightness reported. No acid reflux or abdominal pain.  Bowels moving.  Saw Dr Stasia Cavalier for f/u abnormal mammogram.  Reports everything checked out ok and recommended f/u mammogram in one year.     ROS: See pertinent positives and negatives per HPI.  Past Medical History:  Diagnosis Date   Environmental allergies    Hypercholesterolemia    Hypertension    Migraines     Past Surgical History:  Procedure Laterality Date   ABDOMINAL HYSTERECTOMY  2000    APPENDECTOMY  2000   CATARACT EXTRACTION W/PHACO Left 04/23/2021   Procedure: CATARACT EXTRACTION PHACO AND INTRAOCULAR LENS PLACEMENT (IOC) LEFT panoptix toric 10.41 01:22.8 12.6%;  Surgeon: Leandrew Koyanagi, MD;  Location: [facility];  Service: Ophthalmology;  Laterality: Left;   CATARACT EXTRACTION W/PHACO Right 05/07/2021   Procedure: CATARACT EXTRACTION PHACO AND INTRAOCULAR LENS PLACEMENT (IOC) RIGHT  panoptix 12.85 01:33.8 13.7%;  Surgeon: Leandrew Koyanagi, MD;  Location: [facility];  Service: Ophthalmology;  Laterality: Right;   NOSE SURGERY  1967   SEPTOPLASTY     SHOULDER ARTHROSCOPY WITH OPEN ROTATOR CUFF REPAIR Right 12/05/2015   Procedure: SHOULDER ARTHROSCOPY WITH MINI OPEN ROTATOR CUFF REPAIR. DISTAL CLAVICLE EXCISION;  Surgeon: Thornton Park, MD;  Location: [facility];  Service: Orthopedics;  Laterality: Right;    Family History  Problem Relation Age of Onset   Heart disease Mother    Hyperlipidemia Mother    Hypertension Mother    Cancer Father        lung, prostate   Hyperlipidemia Father    Hypertension Father    Prostate cancer Father    Bladder Cancer Neg Hx    Kidney cancer Neg Hx     SOCIAL HX: reviewed.    Current Outpatient Medications:    acetaminophen (TYLENOL) 500 MG tablet, Take 500 mg by mouth every 6 (six) hours as needed., Disp: , Rfl:    amLODipine (NORVASC) 5 MG tablet, Take 1 tablet (5 mg total) by mouth daily., Disp: 30 tablet, Rfl: 0   Ascorbic Acid (VITAMIN C)  1000 MG tablet, Take 1,000 mg by mouth daily., Disp: , Rfl:    atorvastatin (LIPITOR) 40 MG tablet, Take 1 tablet (40 mg total) by mouth daily., Disp: 90 tablet, Rfl: 0   cetirizine (ZYRTEC) 10 MG tablet, Take 1 tablet (10 mg total) by mouth daily., Disp: 30 tablet, Rfl: 0   Cholecalciferol (VITAMIN D3) 25 MCG (1000 UT) CAPS, Take 1 capsule by mouth daily., Disp: , Rfl:    CRANBERRY EXTRACT PO, Take 1 capsule by mouth 2 (two) times daily., Disp: , Rfl:     D-Mannose 500 MG CAPS, Take 500 mg by mouth in the morning and at bedtime., Disp: , Rfl:    hydrochlorothiazide (HYDRODIURIL) 12.5 MG tablet, TAKE ONE TABLET DAILY., Disp: 90 tablet, Rfl: 0   losartan (COZAAR) 100 MG tablet, Take 1 tablet by mouth daily., Disp: 90 tablet, Rfl: 0   polyethylene glycol (MIRALAX / GLYCOLAX) 17 g packet, Take 17 g by mouth every other day., Disp: , Rfl:    Probiotic Product (ALIGN PO), Take by mouth as needed., Disp: , Rfl:    triamcinolone (NASACORT) 55 MCG/ACT nasal inhaler, Place 2 sprays into the nose as needed., Disp: 1 Inhaler, Rfl: 5   trimethoprim (TRIMPEX) 100 MG tablet, Take 1 tablet (100 mg total) by mouth daily., Disp: 90 tablet, Rfl: 0  EXAM:  GENERAL: alert, oriented, appears well and in no acute distress  HEENT: atraumatic, conjunttiva clear, no obvious abnormalities on inspection of external nose and ears  NECK: normal movements of the head and neck  LUNGS: on inspection no signs of respiratory distress, breathing rate appears normal, no obvious gross SOB, gasping or wheezing  CV: no obvious cyanosis  PSYCH/NEURO: pleasant and cooperative, no obvious depression or anxiety, speech and thought processing grossly intact  ASSESSMENT AND PLAN:  Discussed the following assessment and plan:  Problem List Items Addressed This Visit     Angioleiomyoma    Saw urology.  Stable.  Recommended f/u ultrasound in 05/2022.        Exposure to COVID-19 virus    Son (who lives with her) was exposed to someone covid positive at his work.  Discussed testing and quarantine guidelines.  She is currently asymptomatic.  Follow.       History of abnormal mammogram    Saw Dr Stasia Cavalier.  Per her report, recommended f/u mammogram in one year.       History of COVID-19    Recent diagnosis - 05/2021.  Treated with paxlovid.  Doing well.  No residual problems.  Follow.       Hypercholesterolemia    Continue lipitor.  Low cholesterol diet and exercise.  Follow  lipid panel and liver function tests.       Hypertension    Continue amlodipine and hctz.  Follow pressures.  Follow metabolic panel.        Stress    Overall appears to be handling things relatively well.  Follow.         Return in about 7 weeks (around 10/08/2021) for follow up appt (48mn) 6-8 weeks.  .   I discussed the assessment and treatment plan with the patient. The patient was provided an opportunity to ask questions and all were answered. The patient agreed with the plan and demonstrated an understanding of the instructions.   The patient was advised to call back or seek an in-person evaluation if the symptoms worsen or if the condition fails to improve as anticipated.    Lavanya Roa  Nicki Reaper, MD

## 2021-08-22 ENCOUNTER — Encounter: Payer: Medicare Other | Admitting: Internal Medicine

## 2021-08-24 ENCOUNTER — Encounter: Payer: Self-pay | Admitting: Internal Medicine

## 2021-08-24 DIAGNOSIS — Z20822 Contact with and (suspected) exposure to covid-19: Secondary | ICD-10-CM | POA: Insufficient documentation

## 2021-08-24 NOTE — Assessment & Plan Note (Signed)
Saw urology.  Stable.  Recommended f/u ultrasound in 05/2022.

## 2021-08-24 NOTE — Assessment & Plan Note (Signed)
Recent diagnosis - 05/2021.  Treated with paxlovid.  Doing well.  No residual problems.  Follow.

## 2021-08-24 NOTE — Assessment & Plan Note (Signed)
Continue lipitor.  Low cholesterol diet and exercise.  Follow lipid panel and liver function tests.   

## 2021-08-24 NOTE — Assessment & Plan Note (Signed)
Continue amlodipine and hctz.  Follow pressures.  Follow metabolic panel.  

## 2021-08-24 NOTE — Assessment & Plan Note (Signed)
Son (who lives with her) was exposed to someone covid positive at his work.  Discussed testing and quarantine guidelines.  She is currently asymptomatic.  Follow.

## 2021-08-24 NOTE — Assessment & Plan Note (Signed)
Saw Dr Stasia Cavalier.  Per her report, recommended f/u mammogram in one year.

## 2021-08-24 NOTE — Assessment & Plan Note (Signed)
Overall appears to be handling things relatively well.  Follow.   

## 2021-08-26 ENCOUNTER — Other Ambulatory Visit: Payer: Self-pay | Admitting: Internal Medicine

## 2021-09-01 ENCOUNTER — Other Ambulatory Visit: Payer: Self-pay | Admitting: Internal Medicine

## 2021-09-04 ENCOUNTER — Other Ambulatory Visit: Payer: Medicare Other

## 2021-09-29 ENCOUNTER — Other Ambulatory Visit: Payer: Self-pay | Admitting: Internal Medicine

## 2021-10-02 ENCOUNTER — Other Ambulatory Visit: Payer: Self-pay | Admitting: Internal Medicine

## 2021-10-09 ENCOUNTER — Encounter: Payer: Self-pay | Admitting: Internal Medicine

## 2021-10-09 ENCOUNTER — Other Ambulatory Visit: Payer: Self-pay

## 2021-10-09 ENCOUNTER — Ambulatory Visit (INDEPENDENT_AMBULATORY_CARE_PROVIDER_SITE_OTHER): Payer: Medicare Other | Admitting: Internal Medicine

## 2021-10-09 VITALS — BP 130/74 | HR 81 | Temp 95.7°F | Ht 62.01 in | Wt 124.0 lb

## 2021-10-09 DIAGNOSIS — I10 Essential (primary) hypertension: Secondary | ICD-10-CM

## 2021-10-09 DIAGNOSIS — Z87898 Personal history of other specified conditions: Secondary | ICD-10-CM

## 2021-10-09 DIAGNOSIS — F439 Reaction to severe stress, unspecified: Secondary | ICD-10-CM

## 2021-10-09 DIAGNOSIS — K1379 Other lesions of oral mucosa: Secondary | ICD-10-CM

## 2021-10-09 DIAGNOSIS — R7989 Other specified abnormal findings of blood chemistry: Secondary | ICD-10-CM

## 2021-10-09 DIAGNOSIS — K5909 Other constipation: Secondary | ICD-10-CM

## 2021-10-09 DIAGNOSIS — E7800 Pure hypercholesterolemia, unspecified: Secondary | ICD-10-CM | POA: Diagnosis not present

## 2021-10-09 NOTE — Progress Notes (Signed)
Patient ID: Jamie Elliott, female   DOB: Oct 17, 1946, 75 y.o.   MRN: [id number]   Subjective:    Patient ID: Jamie Elliott, female    DOB: 11/17/1946, 75 y.o.   MRN: [id number]  This visit occurred during the SARS-CoV-2 public health emergency.  Safety protocols were in place, including screening questions prior to the visit, additional usage of staff PPE, and extensive cleaning of exam room while observing appropriate contact time as indicated for disinfecting solutions.   Patient here for a scheduled follow up.     HPI Here to follow up regarding her cholesterol and her blood pressure.  Also saw Dr Stasia Cavalier for f/u abnormal mammogram.  Reviewed outside mammogram - read as Birads I.  Recommended f/u in 02/2022.  Does report sores in her mouth.  No sore throat.  No chest congestion or cough.  Eating.  Some constipation.  Controls.  No abdominal pain.  No nausea or vomiting.  Preparation H helped hemorrhoids.  Discussed diet and exercise.     Past Medical History:  Diagnosis Date   Environmental allergies    Hypercholesterolemia    Hypertension    Migraines    Past Surgical History:  Procedure Laterality Date   ABDOMINAL HYSTERECTOMY  2000   APPENDECTOMY  2000   CATARACT EXTRACTION W/PHACO Left 04/23/2021   Procedure: CATARACT EXTRACTION PHACO AND INTRAOCULAR LENS PLACEMENT (IOC) LEFT panoptix toric 10.41 01:22.8 12.6%;  Surgeon: Leandrew Koyanagi, MD;  Location: [facility];  Service: Ophthalmology;  Laterality: Left;   CATARACT EXTRACTION W/PHACO Right 05/07/2021   Procedure: CATARACT EXTRACTION PHACO AND INTRAOCULAR LENS PLACEMENT (IOC) RIGHT  panoptix 12.85 01:33.8 13.7%;  Surgeon: Leandrew Koyanagi, MD;  Location: [facility];  Service: Ophthalmology;  Laterality: Right;   NOSE SURGERY  1967   SEPTOPLASTY     SHOULDER ARTHROSCOPY WITH OPEN ROTATOR CUFF REPAIR Right 12/05/2015   Procedure: SHOULDER ARTHROSCOPY WITH MINI OPEN ROTATOR CUFF REPAIR. DISTAL CLAVICLE  EXCISION;  Surgeon: Thornton Park, MD;  Location: [facility];  Service: Orthopedics;  Laterality: Right;   Family History  Problem Relation Age of Onset   Heart disease Mother    Hyperlipidemia Mother    Hypertension Mother    Cancer Father        lung, prostate   Hyperlipidemia Father    Hypertension Father    Prostate cancer Father    Bladder Cancer Neg Hx    Kidney cancer Neg Hx    Social History   Socioeconomic History   Marital status: Married    Spouse name: Not on file   Number of children: 2   Years of education: Not on file   Highest education level: Not on file  Occupational History   Not on file  Tobacco Use   Smoking status: Never   Smokeless tobacco: Never  Vaping Use   Vaping Use: Never used  Substance and Sexual Activity   Alcohol use: No    Alcohol/week: 0.0 standard drinks   Drug use: No   Sexual activity: Not Currently  Other Topics Concern   Not on file  Social History Narrative   Not on file   Social Determinants of Health   Financial Resource Strain: Low Risk    Difficulty of Paying Living Expenses: Not hard at all  Food Insecurity: No Food Insecurity   Worried About Charity fundraiser in the Last Year: Never true   [place] in the Last Year: Never true  Transportation  Needs: No Transportation Needs   Lack of Transportation (Medical): No   Lack of Transportation (Non-Medical): No  Physical Activity: Not on file  Stress: No Stress Concern Present   Feeling of Stress : Not at all  Social Connections: Unknown   Frequency of Communication with Friends and Family: More than three times a week   Frequency of Social Gatherings with Friends and Family: More than three times a week   Attends Religious Services: Not on Electrical engineer or Organizations: Not on file   Attends Archivist Meetings: Not on file   Marital Status: Married     Review of Systems  Constitutional:  Negative for appetite change and  unexpected weight change.  HENT:  Negative for congestion and sinus pressure.   Respiratory:  Negative for cough, chest tightness and shortness of breath.   Cardiovascular:  Negative for chest pain, palpitations and leg swelling.  Gastrointestinal:  Negative for abdominal pain, diarrhea, nausea and vomiting.  Genitourinary:  Negative for difficulty urinating and dysuria.  Musculoskeletal:  Negative for joint swelling and myalgias.  Skin:  Negative for color change and rash.  Neurological:  Negative for dizziness, light-headedness and headaches.  Psychiatric/Behavioral:  Negative for agitation and dysphoric mood.       Objective:     BP 130/74   Pulse 81   Temp (!) 95.7 F (35.4 C)   Ht 5' 2.01" (1.575 m)   Wt 124 lb (56.2 kg)   LMP 11/28/1998   SpO2 97%   BMI 22.67 kg/m  Wt Readings from Last 3 Encounters:  10/09/21 124 lb (56.2 kg)  08/20/21 124 lb (56.2 kg)  06/27/21 124 lb (56.2 kg)    Physical Exam Vitals reviewed.  Constitutional:      General: She is not in acute distress.    Appearance: Normal appearance.  HENT:     Head: Normocephalic and atraumatic.     Right Ear: External ear normal.     Left Ear: External ear normal.  Eyes:     General: No scleral icterus.       Right eye: No discharge.        Left eye: No discharge.     Conjunctiva/sclera: Conjunctivae normal.  Neck:     Thyroid: No thyromegaly.  Cardiovascular:     Rate and Rhythm: Normal rate and regular rhythm.  Pulmonary:     Effort: No respiratory distress.     Breath sounds: Normal breath sounds. No wheezing.  Abdominal:     General: Bowel sounds are normal.     Palpations: Abdomen is soft.     Tenderness: There is no abdominal tenderness.  Musculoskeletal:        General: No swelling or tenderness.     Cervical back: Neck supple. No tenderness.  Lymphadenopathy:     Cervical: No cervical adenopathy.  Skin:    Findings: No erythema or rash.  Neurological:     Mental Status: She is  alert.  Psychiatric:        Mood and Affect: Mood normal.        Behavior: Behavior normal.     Outpatient Encounter Medications as of 10/09/2021  Medication Sig   acetaminophen (TYLENOL) 500 MG tablet Take 500 mg by mouth every 6 (six) hours as needed.   amLODipine (NORVASC) 5 MG tablet Take 1 tablet (5 mg total) by mouth daily.   Ascorbic Acid (VITAMIN C) 1000 MG tablet Take 1,000 mg by  mouth daily.   cetirizine (ZYRTEC) 10 MG tablet Take 1 tablet (10 mg total) by mouth daily.   Cholecalciferol (VITAMIN D3) 25 MCG (1000 UT) CAPS Take 1 capsule by mouth daily.   CRANBERRY EXTRACT PO Take 1 capsule by mouth 2 (two) times daily.   D-Mannose 500 MG CAPS Take 500 mg by mouth in the morning and at bedtime.   hydrochlorothiazide (HYDRODIURIL) 12.5 MG tablet TAKE ONE TABLET DAILY.   losartan (COZAAR) 100 MG tablet Take 1 tablet by mouth daily.   polyethylene glycol (MIRALAX / GLYCOLAX) 17 g packet Take 17 g by mouth every other day.   Probiotic Product (ALIGN PO) Take by mouth as needed.   triamcinolone (NASACORT) 55 MCG/ACT nasal inhaler Place 2 sprays into the nose as needed.   trimethoprim (TRIMPEX) 100 MG tablet Take 1 tablet (100 mg total) by mouth daily.   [DISCONTINUED] atorvastatin (LIPITOR) 40 MG tablet Take 1 tablet (40 mg total) by mouth daily.   No facility-administered encounter medications on file as of 10/09/2021.     Lab Results  Component Value Date   WBC 6.2 01/15/2021   HGB 13.1 01/15/2021   HCT 39.3 01/15/2021   PLT 225.0 01/15/2021   GLUCOSE 75 08/15/2021   CHOL 168 08/15/2021   TRIG 138.0 08/15/2021   HDL 56.10 08/15/2021   LDLDIRECT 103.0 05/21/2015   LDLCALC 84 08/15/2021   ALT 17 08/15/2021   AST 19 08/15/2021   NA 140 08/15/2021   K 3.9 08/15/2021   CL 104 08/15/2021   CREATININE 0.79 08/15/2021   BUN 11 08/15/2021   CO2 29 08/15/2021   TSH 1.54 01/15/2021   INR 1.00 11/25/2015       Assessment & Plan:   Problem List Items Addressed This Visit      Abnormal liver function tests    Follow liver function tests.        Constipation    Miralax.  Follow.       History of abnormal mammogram    Saw Dr Stasia Cavalier.  Per her report, recommended f/u mammogram in one year. Due 02/2022.       Hypercholesterolemia    Continue lipitor.  Low cholesterol diet and exercise.  Follow lipid panel and liver function tests.       Relevant Orders   Hepatic function panel   Lipid panel   Hypertension - Primary    Continue amlodipine and hctz.  Follow pressures.  Follow metabolic panel.        Relevant Orders   Basic metabolic panel   Mouth sores    Nystatin swish and spit as directed.  Follow.        Stress    Overall appears to be handling things relatively well.  Will notify me if feels needs anything more.  Follow.          Einar Pheasant, MD

## 2021-10-11 ENCOUNTER — Other Ambulatory Visit: Payer: Self-pay | Admitting: Internal Medicine

## 2021-10-13 ENCOUNTER — Telehealth: Payer: Self-pay | Admitting: Internal Medicine

## 2021-10-13 MED ORDER — NYSTATIN 100000 UNIT/ML MT SUSP: OROMUCOSAL | 0 refills | Status: DC

## 2021-10-13 NOTE — Telephone Encounter (Signed)
LM for pt

## 2021-10-13 NOTE — Telephone Encounter (Signed)
Patient waiting on medication for sores in mouth. Stated the medication has not been called into the pharmacy.

## 2021-10-13 NOTE — Telephone Encounter (Signed)
Rx sent in to [facility].  Sorry for the delay.

## 2021-10-13 NOTE — Telephone Encounter (Signed)
Patient called, note from Dr Nicki Reaper was read. Patient said thank you.

## 2021-10-19 ENCOUNTER — Encounter: Payer: Self-pay | Admitting: Internal Medicine

## 2021-10-19 DIAGNOSIS — K1379 Other lesions of oral mucosa: Secondary | ICD-10-CM | POA: Insufficient documentation

## 2021-10-19 NOTE — Assessment & Plan Note (Signed)
Saw Dr Stasia Cavalier.  Per her report, recommended f/u mammogram in one year. Due 02/2022.

## 2021-10-19 NOTE — Assessment & Plan Note (Signed)
Continue lipitor.  Low cholesterol diet and exercise.  Follow lipid panel and liver function tests.   

## 2021-10-19 NOTE — Assessment & Plan Note (Signed)
Follow liver function tests.   

## 2021-10-19 NOTE — Assessment & Plan Note (Signed)
Continue amlodipine and hctz.  Follow pressures.  Follow metabolic panel.  

## 2021-10-19 NOTE — Assessment & Plan Note (Signed)
Miralax.  Follow.

## 2021-10-19 NOTE — Assessment & Plan Note (Signed)
Nystatin swish and spit as directed.  Follow.

## 2021-10-19 NOTE — Assessment & Plan Note (Signed)
Overall appears to be handling things relatively well.  Will notify me if feels needs anything more.  Follow.

## 2021-10-27 ENCOUNTER — Other Ambulatory Visit: Payer: Self-pay | Admitting: Internal Medicine

## 2021-10-30 MED ORDER — NYSTATIN 100000 UNIT/ML MT SUSP: OROMUCOSAL | 0 refills | Status: DC

## 2021-10-30 NOTE — Telephone Encounter (Signed)
Rx sent in for nystatin suspension.  If persistent symptoms, she will need to be reevaluated.

## 2021-10-30 NOTE — Telephone Encounter (Signed)
Patient is aware 

## 2021-10-30 NOTE — Addendum Note (Signed)
Addended by: Alisa Graff on: 10/30/2021 01:02 PM   Modules accepted: Orders

## 2021-10-30 NOTE — Telephone Encounter (Signed)
Are you ok to refill the nystatin suspension one time and then re-evaluate if does not clear completely?

## 2021-10-30 NOTE — Telephone Encounter (Signed)
Pt is requesting a refill for nystatin (MYCOSTATIN) 100000 unit/ml. Pt states her mouth still isn't completely healed. Pt uses [facility] in [place].

## 2021-11-24 ENCOUNTER — Other Ambulatory Visit: Payer: Self-pay | Admitting: Internal Medicine

## 2021-12-08 ENCOUNTER — Other Ambulatory Visit (INDEPENDENT_AMBULATORY_CARE_PROVIDER_SITE_OTHER): Payer: Medicare Other

## 2021-12-08 ENCOUNTER — Other Ambulatory Visit: Payer: Self-pay

## 2021-12-08 DIAGNOSIS — E7800 Pure hypercholesterolemia, unspecified: Secondary | ICD-10-CM

## 2021-12-08 DIAGNOSIS — I10 Essential (primary) hypertension: Secondary | ICD-10-CM

## 2021-12-08 LAB — LIPID PANEL
Cholesterol: 156 mg/dL (ref 0–200)
HDL: 61.7 mg/dL (ref >39.00)
LDL Cholesterol: 68 mg/dL (ref 0–99)
NonHDL: 93.91
Total CHOL/HDL Ratio: 3
Triglycerides: 129 mg/dL (ref 0.0–149.0)
VLDL: 25.8 mg/dL (ref 0.0–40.0)

## 2021-12-08 LAB — BASIC METABOLIC PANEL
BUN: 15 mg/dL (ref 6–23)
CO2: 30 mEq/L (ref 19–32)
Calcium: 9.9 mg/dL (ref 8.4–10.5)
Chloride: 105 mEq/L (ref 96–112)
Creatinine, Ser: 0.74 mg/dL (ref 0.40–1.20)
GFR: 78.9 mL/min (ref >60.00)
Glucose, Bld: 82 mg/dL (ref 70–99)
Potassium: 4 mEq/L (ref 3.5–5.1)
Sodium: 141 mEq/L (ref 135–145)

## 2021-12-08 LAB — HEPATIC FUNCTION PANEL
ALT: 22 U/L (ref 0–35)
AST: 21 U/L (ref 0–37)
Albumin: 4 g/dL (ref 3.5–5.2)
Alkaline Phosphatase: 85 U/L (ref 39–117)
Bilirubin, Direct: 0.1 mg/dL (ref 0.0–0.3)
Total Bilirubin: 0.6 mg/dL (ref 0.2–1.2)
Total Protein: 6.2 g/dL (ref 6.0–8.3)

## 2021-12-10 ENCOUNTER — Encounter: Payer: Self-pay | Admitting: Internal Medicine

## 2021-12-10 ENCOUNTER — Other Ambulatory Visit: Payer: Self-pay

## 2021-12-10 ENCOUNTER — Ambulatory Visit (INDEPENDENT_AMBULATORY_CARE_PROVIDER_SITE_OTHER): Payer: Medicare Other | Admitting: Internal Medicine

## 2021-12-10 DIAGNOSIS — E559 Vitamin D deficiency, unspecified: Secondary | ICD-10-CM

## 2021-12-10 DIAGNOSIS — E7800 Pure hypercholesterolemia, unspecified: Secondary | ICD-10-CM

## 2021-12-10 DIAGNOSIS — M79672 Pain in left foot: Secondary | ICD-10-CM

## 2021-12-10 DIAGNOSIS — I10 Essential (primary) hypertension: Secondary | ICD-10-CM

## 2021-12-10 DIAGNOSIS — D219 Benign neoplasm of connective and other soft tissue, unspecified: Secondary | ICD-10-CM

## 2021-12-10 DIAGNOSIS — K1379 Other lesions of oral mucosa: Secondary | ICD-10-CM | POA: Diagnosis not present

## 2021-12-10 DIAGNOSIS — Z87898 Personal history of other specified conditions: Secondary | ICD-10-CM

## 2021-12-10 NOTE — Progress Notes (Signed)
Patient ID: STEFFIE WAGGONER, female   DOB: Nov 12, 1946, 75 y.o.   MRN: [id number]   Subjective:    Patient ID: LY WASS, female    DOB: 29-Aug-1946, 75 y.o.   MRN: [id number]  This visit occurred during the SARS-CoV-2 public health emergency.  Safety protocols were in place, including screening questions prior to the visit, additional usage of staff PPE, and extensive cleaning of exam room while observing appropriate contact time as indicated for disinfecting solutions.   Patient here for a scheduled follow up.    HPI Here to follow up regarding blood pressure and cholesterol.  She reports having some pain - left foot - increased pressure areas - bottom of foot. Request referral back to podiatry.  Increased discomfort - can limit her walking.  Also reports persistent facial lesions.  Has appt with dermatology.  Does try to stay active.  No chest pain or sob reported.  No increased cough or congestion.  No acid reflux reported.  No abdominal pain or bowel change reported.  Persistent tongue and mouth irritation.  Tried nystatin suspension and adjusting food - no change.     Past Medical History:  Diagnosis Date   Environmental allergies    Hypercholesterolemia    Hypertension    Migraines    Past Surgical History:  Procedure Laterality Date   ABDOMINAL HYSTERECTOMY  2000   APPENDECTOMY  2000   CATARACT EXTRACTION W/PHACO Left 04/23/2021   Procedure: CATARACT EXTRACTION PHACO AND INTRAOCULAR LENS PLACEMENT (IOC) LEFT panoptix toric 10.41 01:22.8 12.6%;  Surgeon: Leandrew Koyanagi, MD;  Location: [facility];  Service: Ophthalmology;  Laterality: Left;   CATARACT EXTRACTION W/PHACO Right 05/07/2021   Procedure: CATARACT EXTRACTION PHACO AND INTRAOCULAR LENS PLACEMENT (IOC) RIGHT  panoptix 12.85 01:33.8 13.7%;  Surgeon: Leandrew Koyanagi, MD;  Location: [facility];  Service: Ophthalmology;  Laterality: Right;   NOSE SURGERY  1967   SEPTOPLASTY     SHOULDER ARTHROSCOPY  WITH OPEN ROTATOR CUFF REPAIR Right 12/05/2015   Procedure: SHOULDER ARTHROSCOPY WITH MINI OPEN ROTATOR CUFF REPAIR. DISTAL CLAVICLE EXCISION;  Surgeon: Thornton Park, MD;  Location: [facility];  Service: Orthopedics;  Laterality: Right;   Family History  Problem Relation Age of Onset   Heart disease Mother    Hyperlipidemia Mother    Hypertension Mother    Cancer Father        lung, prostate   Hyperlipidemia Father    Hypertension Father    Prostate cancer Father    Bladder Cancer Neg Hx    Kidney cancer Neg Hx    Social History   Socioeconomic History   Marital status: Married    Spouse name: Not on file   Number of children: 2   Years of education: Not on file   Highest education level: Not on file  Occupational History   Not on file  Tobacco Use   Smoking status: Never   Smokeless tobacco: Never  Vaping Use   Vaping Use: Never used  Substance and Sexual Activity   Alcohol use: No    Alcohol/week: 0.0 standard drinks   Drug use: No   Sexual activity: Not Currently  Other Topics Concern   Not on file  Social History Narrative   Not on file   Social Determinants of Health   Financial Resource Strain: Not on file  Food Insecurity: Not on file  Transportation Needs: Not on file  Physical Activity: Not on file  Stress: Not on file  Social  Connections: Not on file     Review of Systems  Constitutional:  Negative for appetite change and unexpected weight change.  HENT:  Negative for congestion and sinus pressure.   Respiratory:  Negative for cough, chest tightness and shortness of breath.   Cardiovascular:  Negative for chest pain, palpitations and leg swelling.  Gastrointestinal:  Negative for abdominal pain, diarrhea, nausea and vomiting.  Genitourinary:  Negative for difficulty urinating and dysuria.  Musculoskeletal:  Negative for joint swelling and myalgias.  Skin:  Negative for color change and rash.  Neurological:  Negative for dizziness,  light-headedness and headaches.  Psychiatric/Behavioral:  Negative for agitation and dysphoric mood.       Objective:     BP 128/80    Pulse 91    Temp (!) 96.2 F (35.7 C)    Ht 5' 2.01" (1.575 m)    Wt 123 lb (55.8 kg)    LMP 11/28/1998    SpO2 99%    BMI 22.49 kg/m  Wt Readings from Last 3 Encounters:  12/10/21 123 lb (55.8 kg)  10/09/21 124 lb (56.2 kg)  08/20/21 124 lb (56.2 kg)    Physical Exam Vitals reviewed.  Constitutional:      General: She is not in acute distress.    Appearance: Normal appearance.  HENT:     Head: Normocephalic and atraumatic.     Right Ear: External ear normal.     Left Ear: External ear normal.  Eyes:     General: No scleral icterus.       Right eye: No discharge.        Left eye: No discharge.     Conjunctiva/sclera: Conjunctivae normal.  Neck:     Thyroid: No thyromegaly.  Cardiovascular:     Rate and Rhythm: Normal rate and regular rhythm.  Pulmonary:     Effort: No respiratory distress.     Breath sounds: Normal breath sounds. No wheezing.  Abdominal:     General: Bowel sounds are normal.     Palpations: Abdomen is soft.     Tenderness: There is no abdominal tenderness.  Musculoskeletal:        General: No swelling or tenderness.     Cervical back: Neck supple. No tenderness.  Lymphadenopathy:     Cervical: No cervical adenopathy.  Skin:    Findings: No erythema or rash.  Neurological:     Mental Status: She is alert.  Psychiatric:        Mood and Affect: Mood normal.        Behavior: Behavior normal.     Outpatient Encounter Medications as of 12/10/2021  Medication Sig   acetaminophen (TYLENOL) 500 MG tablet Take 500 mg by mouth every 6 (six) hours as needed.   amLODipine (NORVASC) 5 MG tablet Take 1 tablet (5 mg total) by mouth daily.   Ascorbic Acid (VITAMIN C) 1000 MG tablet Take 1,000 mg by mouth daily.   atorvastatin (LIPITOR) 40 MG tablet Take 1 tablet (40 mg total) by mouth daily.   cetirizine (ZYRTEC) 10 MG  tablet Take 1 tablet (10 mg total) by mouth daily.   Cholecalciferol (VITAMIN D3) 25 MCG (1000 UT) CAPS Take 1 capsule by mouth daily.   CRANBERRY EXTRACT PO Take 1 capsule by mouth 2 (two) times daily.   D-Mannose 500 MG CAPS Take 500 mg by mouth in the morning and at bedtime.   hydrochlorothiazide (HYDRODIURIL) 12.5 MG tablet TAKE ONE TABLET DAILY.   losartan (COZAAR) 100  MG tablet Take 1 tablet by mouth daily.   nystatin (MYCOSTATIN) 100000 UNIT/ML suspension 5cc's swish and spit tid   polyethylene glycol (MIRALAX / GLYCOLAX) 17 g packet Take 17 g by mouth every other day.   Probiotic Product (ALIGN PO) Take by mouth as needed.   triamcinolone (NASACORT) 55 MCG/ACT nasal inhaler Place 2 sprays into the nose as needed.   trimethoprim (TRIMPEX) 100 MG tablet Take 1 tablet (100 mg total) by mouth daily.   No facility-administered encounter medications on file as of 12/10/2021.     Lab Results  Component Value Date   WBC 6.2 01/15/2021   HGB 13.1 01/15/2021   HCT 39.3 01/15/2021   PLT 225.0 01/15/2021   GLUCOSE 82 12/08/2021   CHOL 156 12/08/2021   TRIG 129.0 12/08/2021   HDL 61.70 12/08/2021   LDLDIRECT 103.0 05/21/2015   LDLCALC 68 12/08/2021   ALT 22 12/08/2021   AST 21 12/08/2021   NA 141 12/08/2021   K 4.0 12/08/2021   CL 105 12/08/2021   CREATININE 0.74 12/08/2021   BUN 15 12/08/2021   CO2 30 12/08/2021   TSH 1.54 01/15/2021   INR 1.00 11/25/2015       Assessment & Plan:   Problem List Items Addressed This Visit     Angioleiomyoma    Saw urology.  Stable.  Recommended f/u ultrasound in 05/2022.        History of abnormal mammogram    Saw Dr Stasia Cavalier.  Per her report, recommended f/u mammogram in one year. Due 02/2022.       Hypercholesterolemia    Continue lipitor.  Low cholesterol diet and exercise.  Follow lipid panel and liver function tests.       Relevant Orders   CBC with Differential/Platelet   Hepatic function panel   Lipid panel   TSH    Hypertension    Continue amlodipine and hctz.  Follow pressures.  Follow metabolic panel.        Relevant Orders   Basic metabolic panel   Left foot pain    Persistent pain - increased pressure areas bottom of foot.  Refer back to podiatry.       Mouth sores    Persistent mouth irritation.  Did not improve with diet adjustment or nystatin supsension.  Refer to ENT for further evaluation.       Relevant Orders   Ambulatory referral to ENT   Vitamin D deficiency    Recheck vitamin D level with next labs.       Relevant Orders   VITAMIN D 25 Hydroxy (Vit-D Deficiency, Fractures)     Einar Pheasant, MD

## 2021-12-11 ENCOUNTER — Ambulatory Visit (INDEPENDENT_AMBULATORY_CARE_PROVIDER_SITE_OTHER): Payer: Medicare Other

## 2021-12-11 ENCOUNTER — Encounter: Payer: Self-pay | Admitting: Internal Medicine

## 2021-12-11 ENCOUNTER — Telehealth: Payer: Self-pay | Admitting: Internal Medicine

## 2021-12-11 VITALS — Ht 62.0 in | Wt 123.0 lb

## 2021-12-11 DIAGNOSIS — Z0000 Encounter for general adult medical examination without abnormal findings: Secondary | ICD-10-CM

## 2021-12-11 DIAGNOSIS — E559 Vitamin D deficiency, unspecified: Secondary | ICD-10-CM | POA: Insufficient documentation

## 2021-12-11 NOTE — Assessment & Plan Note (Signed)
Continue amlodipine and hctz.  Follow pressures.  Follow metabolic panel.  

## 2021-12-11 NOTE — Assessment & Plan Note (Signed)
Saw urology.  Stable.  Recommended f/u ultrasound in 05/2022.

## 2021-12-11 NOTE — Patient Instructions (Addendum)
Jamie Elliott , Thank you for taking time to come for your Medicare Wellness Visit. I appreciate your ongoing commitment to your health goals. Please review the following plan we discussed and let me know if I can assist you in the future.   These are the goals we discussed:  Goals      Maintain Healthy Lifestyle     Keep all routine scheduled appointments Stay active        This is a list of the screening recommended for you and due dates:  Health Maintenance  Topic Date Due   Zoster (Shingles) Vaccine (1 of 2) 03/10/2022*   Mammogram  02/19/2022   Tetanus Vaccine  01/11/2023   Colon Cancer Screening  09/10/2030   Pneumonia Vaccine  Completed   Flu Shot  Completed   DEXA scan (bone density measurement)  Completed   COVID-19 Vaccine  Completed   Hepatitis C Screening: USPSTF Recommendation to screen - Ages 30-79 yo.  Completed   HPV Vaccine  Aged Out  *Topic was postponed. The date shown is not the original due date.   Advanced directives: on file  Conditions/risks identified: none new  Follow up in one year for your annual wellness visit    Preventive Care 65 Years and Older, Female Preventive care refers to lifestyle choices and visits with your health care provider that can promote health and wellness. What does preventive care include? A yearly physical exam. This is also called an annual well check. Dental exams once or twice a year. Routine eye exams. Ask your health care provider how often you should have your eyes checked. Personal lifestyle choices, including: Daily care of your teeth and gums. Regular physical activity. Eating a healthy diet. Avoiding tobacco and drug use. Limiting alcohol use. Practicing safe sex. Taking low-dose aspirin every day. Taking vitamin and mineral supplements as recommended by your health care provider. What happens during an annual well check? The services and screenings done by your health care provider during your annual well  check will depend on your age, overall health, lifestyle risk factors, and family history of disease. Counseling  Your health care provider may ask you questions about your: Alcohol use. Tobacco use. Drug use. Emotional well-being. Home and relationship well-being. Sexual activity. Eating habits. History of falls. Memory and ability to understand (cognition). Work and work Statistician. Reproductive health. Screening  You may have the following tests or measurements: Height, weight, and BMI. Blood pressure. Lipid and cholesterol levels. These may be checked every 5 years, or more frequently if you are over 32 years old. Skin check. Lung cancer screening. You may have this screening every year starting at age 74 if you have a 30-pack-year history of smoking and currently smoke or have quit within the past 15 years. Fecal occult blood test (FOBT) of the stool. You may have this test every year starting at age 34. Flexible sigmoidoscopy or colonoscopy. You may have a sigmoidoscopy every 5 years or a colonoscopy every 10 years starting at age 60. Hepatitis C blood test. Hepatitis B blood test. Sexually transmitted disease (STD) testing. Diabetes screening. This is done by checking your blood sugar (glucose) after you have not eaten for a while (fasting). You may have this done every 1-3 years. Bone density scan. This is done to screen for osteoporosis. You may have this done starting at age 40. Mammogram. This may be done every 1-2 years. Talk to your health care provider about how often you should have regular  mammograms. Talk with your health care provider about your test results, treatment options, and if necessary, the need for more tests. Vaccines  Your health care provider may recommend certain vaccines, such as: Influenza vaccine. This is recommended every year. Tetanus, diphtheria, and acellular pertussis (Tdap, Td) vaccine. You may need a Td booster every 10 years. Zoster  vaccine. You may need this after age 37. Pneumococcal 13-valent conjugate (PCV13) vaccine. One dose is recommended after age 87. Pneumococcal polysaccharide (PPSV23) vaccine. One dose is recommended after age 54. Talk to your health care provider about which screenings and vaccines you need and how often you need them. This information is not intended to replace advice given to you by your health care provider. Make sure you discuss any questions you have with your health care provider. Document Released: 01/03/2016 Document Revised: 08/26/2016 Document Reviewed: 10/08/2015 Elsevier Interactive Patient Education  2017 [place] Prevention in the Home Falls can cause injuries. They can happen to people of all ages. There are many things you can do to make your home safe and to help prevent falls. What can I do on the outside of my home? Regularly fix the edges of walkways and driveways and fix any cracks. Remove anything that might make you trip as you walk through a door, such as a raised step or threshold. Trim any bushes or trees on the path to your home. Use bright outdoor lighting. Clear any walking paths of anything that might make someone trip, such as rocks or tools. Regularly check to see if handrails are loose or broken. Make sure that both sides of any steps have handrails. Any raised decks and porches should have guardrails on the edges. Have any leaves, snow, or ice cleared regularly. Use sand or salt on walking paths during winter. Clean up any spills in your garage right away. This includes oil or grease spills. What can I do in the bathroom? Use night lights. Install grab bars by the toilet and in the tub and shower. Do not use towel bars as grab bars. Use non-skid mats or decals in the tub or shower. If you need to sit down in the shower, use a plastic, non-slip stool. Keep the floor dry. Clean up any water that spills on the floor as soon as it happens. Remove  soap buildup in the tub or shower regularly. Attach bath mats securely with double-sided non-slip rug tape. Do not have throw rugs and other things on the floor that can make you trip. What can I do in the bedroom? Use night lights. Make sure that you have a light by your bed that is easy to reach. Do not use any sheets or blankets that are too big for your bed. They should not hang down onto the floor. Have a firm chair that has side arms. You can use this for support while you get dressed. Do not have throw rugs and other things on the floor that can make you trip. What can I do in the kitchen? Clean up any spills right away. Avoid walking on wet floors. Keep items that you use a lot in easy-to-reach places. If you need to reach something above you, use a strong step stool that has a grab bar. Keep electrical cords out of the way. Do not use floor polish or wax that makes floors slippery. If you must use wax, use non-skid floor wax. Do not have throw rugs and other things on the floor that can  make you trip. What can I do with my stairs? Do not leave any items on the stairs. Make sure that there are handrails on both sides of the stairs and use them. Fix handrails that are broken or loose. Make sure that handrails are as long as the stairways. Check any carpeting to make sure that it is firmly attached to the stairs. Fix any carpet that is loose or worn. Avoid having throw rugs at the top or bottom of the stairs. If you do have throw rugs, attach them to the floor with carpet tape. Make sure that you have a light switch at the top of the stairs and the bottom of the stairs. If you do not have them, ask someone to add them for you. What else can I do to help prevent falls? Wear shoes that: Do not have high heels. Have rubber bottoms. Are comfortable and fit you well. Are closed at the toe. Do not wear sandals. If you use a stepladder: Make sure that it is fully opened. Do not climb a  closed stepladder. Make sure that both sides of the stepladder are locked into place. Ask someone to hold it for you, if possible. Clearly mark and make sure that you can see: Any grab bars or handrails. First and last steps. Where the edge of each step is. Use tools that help you move around (mobility aids) if they are needed. These include: Canes. Walkers. Scooters. Crutches. Turn on the lights when you go into a dark area. Replace any light bulbs as soon as they burn out. Set up your furniture so you have a clear path. Avoid moving your furniture around. If any of your floors are uneven, fix them. If there are any pets around you, be aware of where they are. Review your medicines with your doctor. Some medicines can make you feel dizzy. This can increase your chance of falling. Ask your doctor what other things that you can do to help prevent falls. This information is not intended to replace advice given to you by your health care provider. Make sure you discuss any questions you have with your health care provider. Document Released: 10/03/2009 Document Revised: 05/14/2016 Document Reviewed: 01/11/2015 Elsevier Interactive Patient Education  2017 [employer].

## 2021-12-11 NOTE — Assessment & Plan Note (Signed)
Continue lipitor.  Low cholesterol diet and exercise.  Follow lipid panel and liver function tests.   

## 2021-12-11 NOTE — Telephone Encounter (Signed)
Need copy of her colonoscopy - Dr Alice Reichert Jefm Bryant 2021.  Also, she needs a f/u appt with Dr Daylene Katayama (podiatry) - persistent foot pain.  Last seen 02/2021.

## 2021-12-11 NOTE — Assessment & Plan Note (Signed)
Recheck vitamin D level with next labs.  

## 2021-12-11 NOTE — Assessment & Plan Note (Signed)
Persistent pain - increased pressure areas bottom of foot.  Refer back to podiatry.

## 2021-12-11 NOTE — Assessment & Plan Note (Signed)
Persistent mouth irritation.  Did not improve with diet adjustment or nystatin supsension.  Refer to ENT for further evaluation.

## 2021-12-11 NOTE — Telephone Encounter (Signed)
Patient scheduled and aware of date and time

## 2021-12-11 NOTE — Progress Notes (Signed)
Subjective:   Jamie Elliott is a 75 y.o. female who presents for Medicare Annual (Subsequent) preventive examination.  Review of Systems    No ROS.  Medicare Wellness Virtual Visit.  Visual/audio telehealth visit, UTA vital signs.   See social history for additional risk factors.   Cardiac Risk Factors include: advanced age (>50men, >53 women)     Objective:    Today's Vitals   12/11/21 0948  Weight: 123 lb (55.8 kg)  Height: 5\' 2"  (1.575 m)   Body mass index is 22.5 kg/m.  Advanced Directives 12/11/2021 12/10/2020 12/08/2019 10/04/2018 09/30/2017 09/30/2016 12/31/2015  Does Patient Have a Medical Advance Directive? Yes Yes Yes Yes Yes Yes Yes  Type of Paramedic of [place];Living will [place];Living will [place];Living will Living will;Healthcare Power of [place];Living will [place];Living will -  Does patient want to make changes to medical advance directive? No - Patient declined No - Patient declined No - Patient declined No - Patient declined No - Patient declined - -  Copy of [place] in Chart? Yes - validated most recent copy scanned in chart (See row information) Yes - validated most recent copy scanned in chart (See row information) Yes - validated most recent copy scanned in chart (See row information) No - copy requested No - copy requested No - copy requested No - copy requested    Current Medications (verified) Outpatient Encounter Medications as of 12/11/2021  Medication Sig   acetaminophen (TYLENOL) 500 MG tablet Take 500 mg by mouth every 6 (six) hours as needed.   amLODipine (NORVASC) 5 MG tablet Take 1 tablet (5 mg total) by mouth daily.   Ascorbic Acid (VITAMIN C) 1000 MG tablet Take 1,000 mg by mouth daily.   atorvastatin (LIPITOR) 40 MG tablet Take 1 tablet (40 mg total) by mouth daily.   cetirizine (ZYRTEC) 10 MG tablet  Take 1 tablet (10 mg total) by mouth daily.   Cholecalciferol (VITAMIN D3) 25 MCG (1000 UT) CAPS Take 1 capsule by mouth daily.   CRANBERRY EXTRACT PO Take 1 capsule by mouth 2 (two) times daily.   D-Mannose 500 MG CAPS Take 500 mg by mouth in the morning and at bedtime.   hydrochlorothiazide (HYDRODIURIL) 12.5 MG tablet TAKE ONE TABLET DAILY.   losartan (COZAAR) 100 MG tablet Take 1 tablet by mouth daily.   nystatin (MYCOSTATIN) 100000 UNIT/ML suspension 5cc's swish and spit tid   polyethylene glycol (MIRALAX / GLYCOLAX) 17 g packet Take 17 g by mouth every other day.   Probiotic Product (ALIGN PO) Take by mouth as needed.   triamcinolone (NASACORT) 55 MCG/ACT nasal inhaler Place 2 sprays into the nose as needed.   trimethoprim (TRIMPEX) 100 MG tablet Take 1 tablet (100 mg total) by mouth daily.   No facility-administered encounter medications on file as of 12/11/2021.    Allergies (verified) Ciprofloxacin, Zithromax [azithromycin], and Augmentin [amoxicillin-pot clavulanate]   History: Past Medical History:  Diagnosis Date   Environmental allergies    Hypercholesterolemia    Hypertension    Migraines    Past Surgical History:  Procedure Laterality Date   ABDOMINAL HYSTERECTOMY  2000   APPENDECTOMY  2000   CATARACT EXTRACTION W/PHACO Left 04/23/2021   Procedure: CATARACT EXTRACTION PHACO AND INTRAOCULAR LENS PLACEMENT (IOC) LEFT panoptix toric 10.41 01:22.8 12.6%;  Surgeon: Leandrew Koyanagi, MD;  Location: [facility];  Service: Ophthalmology;  Laterality: Left;  CATARACT EXTRACTION W/PHACO Right 05/07/2021   Procedure: CATARACT EXTRACTION PHACO AND INTRAOCULAR LENS PLACEMENT (IOC) RIGHT  panoptix 12.85 01:33.8 13.7%;  Surgeon: Leandrew Koyanagi, MD;  Location: [facility];  Service: Ophthalmology;  Laterality: Right;   NOSE SURGERY  1967   SEPTOPLASTY     SHOULDER ARTHROSCOPY WITH OPEN ROTATOR CUFF REPAIR Right 12/05/2015   Procedure: SHOULDER ARTHROSCOPY  WITH MINI OPEN ROTATOR CUFF REPAIR. DISTAL CLAVICLE EXCISION;  Surgeon: Thornton Park, MD;  Location: [facility];  Service: Orthopedics;  Laterality: Right;   Family History  Problem Relation Age of Onset   Heart disease Mother    Hyperlipidemia Mother    Hypertension Mother    Cancer Father        lung, prostate   Hyperlipidemia Father    Hypertension Father    Prostate cancer Father    Bladder Cancer Neg Hx    Kidney cancer Neg Hx    Social History   Socioeconomic History   Marital status: Married    Spouse name: Not on file   Number of children: 2   Years of education: Not on file   Highest education level: Not on file  Occupational History   Not on file  Tobacco Use   Smoking status: Never   Smokeless tobacco: Never  Vaping Use   Vaping Use: Never used  Substance and Sexual Activity   Alcohol use: No    Alcohol/week: 0.0 standard drinks   Drug use: No   Sexual activity: Not Currently  Other Topics Concern   Not on file  Social History Narrative   Not on file   Social Determinants of Health   Financial Resource Strain: Low Risk    Difficulty of Paying Living Expenses: Not hard at all  Food Insecurity: No Food Insecurity   Worried About Charity fundraiser in the Last Year: Never true   [place] in the Last Year: Never true  Transportation Needs: No Transportation Needs   Lack of Transportation (Medical): No   Lack of Transportation (Non-Medical): No  Physical Activity: Sufficiently Active   Days of Exercise per Week: 5 days   Minutes of Exercise per Session: 50 min  Stress: No Stress Concern Present   Feeling of Stress : Not at all  Social Connections: Unknown   Frequency of Communication with Friends and Family: More than three times a week   Frequency of Social Gatherings with Friends and Family: More than three times a week   Attends Religious Services: Not on Electrical engineer or Organizations: Not on file   Attends Theatre manager Meetings: Not on file   Marital Status: Married    Tobacco Counseling Counseling given: Not Answered   Clinical Intake:  Pre-visit preparation completed: Yes        Diabetes: No  How often do you need to have someone help you when you read instructions, pamphlets, or other written materials from your doctor or pharmacy?: 1 - Never Interpreter Needed?: No      Activities of Daily Living In your present state of health, do you have any difficulty performing the following activities: 12/11/2021  Hearing? N  Vision? N  Difficulty concentrating or making decisions? N  Walking or climbing stairs? N  Dressing or bathing? N  Doing errands, shopping? N  Preparing Food and eating ? N  Using the Toilet? N  In the past six months, have you accidently leaked urine? N  Do you have problems with loss of bowel control? N  Managing your Medications? N  Managing your Finances? N  Housekeeping or managing your Housekeeping? N  Some recent data might be hidden    Patient Care Team: Einar Pheasant, MD as PCP - General (Internal Medicine)  Indicate any recent Medical Services you may have received from other than [facility] providers in the past year (date may be approximate).     Assessment:   This is a routine wellness examination for [place].  Virtual Visit via Telephone Note  I connected with  Jamie Elliott on 12/11/21 at  9:45 AM EST by telephone and verified that I am speaking with the correct person using two identifiers.  Persons participating in the virtual visit: patient/Nurse Health Advisor   I discussed the limitations, risks, security and privacy concerns of performing an evaluation and management service by telephone and the availability of in person appointments. The patient expressed understanding and agreed to proceed.  Interactive audio and video telecommunications were attempted between this nurse and patient, however failed, due to patient having  technical difficulties OR patient did not have access to video capability.  We continued and completed visit with audio only.  Some vital signs may be absent or patient reported.    Hearing/Vision screen Hearing Screening - Comments:: Patient is able to hear conversational tones without difficulty. No issues reported. Vision Screening - Comments:: Followed by [facility] Wears corrective lenses They have regular follow up with the ophthalmologist  Dietary issues and exercise activities discussed: Current Exercise Habits: Home exercise routine, Type of exercise: walking, Time (Minutes): 45, Frequency (Times/Week): 5, Weekly Exercise (Minutes/Week): 225 Regular diet Good water intake   Goals Addressed             This Visit's Progress    Maintain Healthy Lifestyle       Keep all routine scheduled appointments Stay active       Depression Screen [facility] 2/9 Scores 12/11/2021 12/10/2021 10/09/2021 02/26/2021 12/10/2020 04/18/2020 12/08/2019  PHQ - 2 Score 0 0 0 0 0 0 0  PHQ- 9 Score - - - - - - -    Fall Risk Fall Risk  12/11/2021 12/10/2021 10/09/2021 06/16/2021 12/18/2020  Falls in the past year? 0 0 0 0 1  Number falls in past yr: - 0 0 0 0  Comment - - - - -  Injury with Fall? - 0 0 0 0  Comment - - - - -  Follow up Falls evaluation completed Falls evaluation completed Falls evaluation completed - Falls evaluation completed    [place]: Home free of loose throw rugs in walkways, pet beds, electrical cords, etc? Yes  Adequate lighting in your home to reduce risk of falls? Yes   ASSISTIVE DEVICES UTILIZED TO PREVENT FALLS: Life alert? No  Use of a cane, walker or w/c? No   TIMED UP AND GO: Was the test performed? No .   Cognitive Function: Patient is alert and oriented x3.  MMSE - Mini Mental State Exam 10/04/2018 09/30/2017 09/30/2016 09/30/2015  Orientation to time 5 5 5 5   Orientation to Place 5 5 5 5   Registration 3 3 3  3   Attention/ Calculation 5 5 5 5   Recall 3 3 3 3   Language- name 2 objects 2 2 2 2   Language- repeat 1 1 1 1   Language- follow 3 step command 3 3 3 3   Language- read & follow  direction 1 1 1 1   Write a sentence 1 1 1 1   Copy design 1 1 1 1   Total score 30 30 30 30      6CIT Screen 12/08/2019  What Year? 0 points  What month? 0 points  What time? 0 points  Count back from 20 0 points  Months in reverse 0 points  Repeat phrase 2 points  Total Score 2    Immunizations Immunization History  Administered Date(s) Administered   Fluad Quad(high Dose 65+) 09/29/2021   Influenza Split 10/27/2012   Influenza, High Dose Seasonal PF 09/30/2016, 10/04/2018   Influenza,inj,Quad PF,6+ Mos 08/16/2014, 08/20/2015   Influenza,inj,quad, With Preservative 10/20/2017, 10/17/2019   Influenza-Unspecified 09/25/2020   PFIZER(Purple Top)SARS-COV-2 Vaccination 02/01/2020, 02/22/2020, 09/25/2020   Pfizer Covid-19 Vaccine Bivalent Booster 10yrs & up 10/14/2021   Pneumococcal Conjugate-13 10/17/2019   Pneumococcal Polysaccharide-23 06/05/2013   Tdap 01/11/2013   Zoster, Live 06/04/2011   Shingrix Completed?: No.    Education has been provided regarding the importance of this vaccine. Patient has been advised to call insurance company to determine out of pocket expense if they have not yet received this vaccine. Advised may also receive vaccine at local pharmacy or Health Dept. Verbalized acceptance and understanding.  Screening Tests Health Maintenance  Topic Date Due   Zoster Vaccines- Shingrix (1 of 2) 03/10/2022 (Originally 01/28/1996)   MAMMOGRAM  02/19/2022   TETANUS/TDAP  01/11/2023   COLONOSCOPY (Pts 45-43yrs Insurance coverage will need to be confirmed)  09/10/2030   Pneumonia Vaccine 76+ Years old  Completed   INFLUENZA VACCINE  Completed   DEXA SCAN  Completed   COVID-19 Vaccine  Completed   Hepatitis C Screening  Completed   HPV VACCINES  Aged Out   Health Maintenance There are no  preventive care reminders to display for this patient.  Lung Cancer Screening: (Low Dose CT Chest recommended if Age 28-80 years, 30 pack-year currently smoking OR have quit w/in 15years.) does not qualify.   Vision Screening: Recommended annual ophthalmology exams for early detection of glaucoma and other disorders of the eye.  Dental Screening: Recommended annual dental exams for proper oral hygiene  Community Resource Referral / Chronic Care Management: CRR required this visit?  No   CCM required this visit?  No      Plan:   Keep all routine maintenance appointments.   I have personally reviewed and noted the following in the patients chart:   Medical and social history Use of alcohol, tobacco or illicit drugs  Current medications and supplements including opioid prescriptions. Not taking opioid.  Functional ability and status Nutritional status Physical activity Advanced directives List of other physicians Hospitalizations, surgeries, and ER visits in previous 12 months Vitals Screenings to include cognitive, depression, and falls Referrals and appointments  In addition, I have reviewed and discussed with patient certain preventive protocols, quality metrics, and best practice recommendations. A written personalized care plan for preventive services as well as general preventive health recommendations were provided to patient.     Varney Biles, LPN   [id number]

## 2021-12-11 NOTE — Assessment & Plan Note (Signed)
Saw Dr Stasia Cavalier.  Per her report, recommended f/u mammogram in one year. Due 02/2022.

## 2021-12-19 DIAGNOSIS — Z961 Presence of intraocular lens: Secondary | ICD-10-CM | POA: Diagnosis not present

## 2021-12-23 ENCOUNTER — Other Ambulatory Visit: Payer: Self-pay

## 2021-12-23 ENCOUNTER — Ambulatory Visit (INDEPENDENT_AMBULATORY_CARE_PROVIDER_SITE_OTHER): Payer: Medicare Other | Admitting: Podiatry

## 2021-12-23 ENCOUNTER — Encounter: Payer: Self-pay | Admitting: Podiatry

## 2021-12-23 ENCOUNTER — Other Ambulatory Visit: Payer: Self-pay | Admitting: Internal Medicine

## 2021-12-23 ENCOUNTER — Ambulatory Visit (INDEPENDENT_AMBULATORY_CARE_PROVIDER_SITE_OTHER): Payer: Medicare Other

## 2021-12-23 DIAGNOSIS — M2012 Hallux valgus (acquired), left foot: Secondary | ICD-10-CM

## 2021-12-23 DIAGNOSIS — L84 Corns and callosities: Secondary | ICD-10-CM | POA: Diagnosis not present

## 2021-12-23 DIAGNOSIS — M7742 Metatarsalgia, left foot: Secondary | ICD-10-CM

## 2021-12-23 NOTE — Progress Notes (Signed)
° °  HPI: 76 y.o. female presenting today presenting today for follow-up evaluation of left forefoot pain.  Patient developed symptomatic calluses and she has been applying corn and callus remover with some minimal relief.  She also states that she has high arches and she is concerned that she experiences pain with activity.  She presents for further treatment and evaluation  Past Medical History:  Diagnosis Date   Environmental allergies    Hypercholesterolemia    Hypertension    Migraines      Physical Exam: General: The patient is alert and oriented x3 in no acute distress.  Dermatology: Skin is warm, dry and supple bilateral lower extremities. Negative for open lesions or macerations.  Hyperkeratotic callus lesions noted to the left plantar forefoot  Vascular: Palpable pedal pulses bilaterally. No edema or erythema noted. Capillary refill within normal limits.  Neurological: Epicritic and protective threshold grossly intact bilaterally.  Paresthesia with numbness between the second and third digits of the left foot  Musculoskeletal Exam: Range of motion within normal limits to all pedal and ankle joints bilateral. Muscle strength 5/5 in all groups bilateral.  There is some tenderness palpation left forefoot with symptomatic callus lesions along the metatarsal heads   Assessment: 1.  Morton's metatarsalgia left forefoot 2.  Symptomatic callus lesions left plantar forefoot   Plan of Care:  1. Patient evaluated. 2. Appt with pedorthist for custom molded orthotics to offload pressure from the forefoot and support the arches 3. Light debridement of the callus lesions was performed to the LT forefoot.  4.  Continue OTC corn callus remover as needed 5.  Return to clinic for orthotics fitting and as needed with me      Edrick Kins, DPM [facility]  Dr. Edrick Kins, DPM    [address], [place] [address]                 Office [phone]  Fax [phone]

## 2021-12-29 ENCOUNTER — Other Ambulatory Visit: Payer: Self-pay | Admitting: Internal Medicine

## 2022-01-02 ENCOUNTER — Ambulatory Visit: Payer: Medicare Other

## 2022-01-02 ENCOUNTER — Other Ambulatory Visit: Payer: Self-pay

## 2022-01-02 DIAGNOSIS — L84 Corns and callosities: Secondary | ICD-10-CM

## 2022-01-02 DIAGNOSIS — M2012 Hallux valgus (acquired), left foot: Secondary | ICD-10-CM

## 2022-01-02 DIAGNOSIS — M7742 Metatarsalgia, left foot: Secondary | ICD-10-CM | POA: Diagnosis not present

## 2022-01-02 DIAGNOSIS — M7741 Metatarsalgia, right foot: Secondary | ICD-10-CM | POA: Diagnosis not present

## 2022-01-02 NOTE — Progress Notes (Signed)
SITUATION Reason for Consult: Evaluation for Bilateral Custom Foot Orthoses Patient / Caregiver Report: Patient would like a comfortable insole  OBJECTIVE DATA: Patient History / Diagnosis:    ICD-10-CM   1. Hav (hallux abducto valgus), left  M20.12     2. Metatarsalgia, left foot  M77.42     3. Callus of foot  L84       Current or Previous Devices: None and no history  Foot Examination: Skin presentation:   Intact Ulcers & Callousing:   None and no history Toe / Foot Deformities:  Prominent metatarsals Weight Bearing Presentation:  Rectus Sensation:    Intact  ORTHOTIC RECOMMENDATION Recommended Device: 1x pair of custom functional foot orthotics  GOALS OF ORTHOSES - Reduce Pain - Prevent Foot Deformity - Prevent Progression of Further Foot Deformity - Relieve Pressure - Improve the Overall Biomechanical Function of the Foot and Lower Extremity.  ACTIONS PERFORMED Patient was casted for Foot Orthoses via crush box. Procedure was explained and patient tolerated procedure well. All questions were answered and concerns addressed.  PLAN Potential out of pocket cost was communicated to patient. Casts are to be sent to [facility] for fabrication. Patient is to be called for fitting when devices are ready.

## 2022-01-08 ENCOUNTER — Other Ambulatory Visit: Payer: Self-pay | Admitting: Internal Medicine

## 2022-02-13 ENCOUNTER — Ambulatory Visit: Payer: Medicare Other

## 2022-02-13 ENCOUNTER — Other Ambulatory Visit: Payer: Self-pay

## 2022-02-13 DIAGNOSIS — K140 Glossitis: Secondary | ICD-10-CM | POA: Diagnosis not present

## 2022-02-13 DIAGNOSIS — M2012 Hallux valgus (acquired), left foot: Secondary | ICD-10-CM

## 2022-02-13 DIAGNOSIS — M7742 Metatarsalgia, left foot: Secondary | ICD-10-CM

## 2022-02-13 DIAGNOSIS — G5762 Lesion of plantar nerve, left lower limb: Secondary | ICD-10-CM

## 2022-02-13 DIAGNOSIS — L84 Corns and callosities: Secondary | ICD-10-CM

## 2022-02-13 NOTE — Progress Notes (Signed)
SITUATION: Reason for Visit: Fitting and Delivery of Custom Fabricated Foot Orthoses Patient Report: Patient reports comfort and is satisfied with device.  OBJECTIVE DATA: Patient History / Diagnosis:     ICD-10-CM   1. Hav (hallux abducto valgus), left  M20.12     2. Metatarsalgia, left foot  M77.42     3. Callus of foot  L84     4. Morton's neuroma, left  G57.62       Provided Device:  Custom Functional Foot Orthotics     [employer]: [phone] GOAL OF ORTHOSIS - Improve gait - Decrease energy expenditure - Improve Balance - Provide Triplanar stability of foot complex - Facilitate motion  ACTIONS PERFORMED Patient was fit with foot orthotics trimmed to shoe last. Patient tolerated fittign procedure.   Patient was provided with verbal and written instruction and demonstration regarding donning, doffing, wear, care, proper fit, function, purpose, cleaning, and use of the orthosis and in all related precautions and risks and benefits regarding the orthosis.  Patient was also provided with verbal instruction regarding how to report any failures or malfunctions of the orthosis and necessary follow up care. Patient was also instructed to contact our office regarding any change in status that may affect the function of the orthosis.  Patient demonstrated independence with proper donning, doffing, and fit and verbalized understanding of all instructions.  PLAN: Patient is to follow up in one week or as necessary (PRN). All questions were answered and concerns addressed. Plan of care was discussed with and agreed upon by the patient.

## 2022-02-23 ENCOUNTER — Other Ambulatory Visit: Payer: Self-pay | Admitting: Internal Medicine

## 2022-02-23 DIAGNOSIS — Z1231 Encounter for screening mammogram for malignant neoplasm of breast: Secondary | ICD-10-CM | POA: Diagnosis not present

## 2022-02-23 LAB — HM MAMMOGRAPHY

## 2022-02-25 DIAGNOSIS — D0439 Carcinoma in situ of skin of other parts of face: Secondary | ICD-10-CM | POA: Diagnosis not present

## 2022-02-25 DIAGNOSIS — D225 Melanocytic nevi of trunk: Secondary | ICD-10-CM | POA: Diagnosis not present

## 2022-02-25 DIAGNOSIS — L821 Other seborrheic keratosis: Secondary | ICD-10-CM | POA: Diagnosis not present

## 2022-02-25 DIAGNOSIS — D2261 Melanocytic nevi of right upper limb, including shoulder: Secondary | ICD-10-CM | POA: Diagnosis not present

## 2022-02-25 DIAGNOSIS — D2262 Melanocytic nevi of left upper limb, including shoulder: Secondary | ICD-10-CM | POA: Diagnosis not present

## 2022-03-12 ENCOUNTER — Other Ambulatory Visit: Payer: Self-pay

## 2022-03-12 ENCOUNTER — Other Ambulatory Visit (INDEPENDENT_AMBULATORY_CARE_PROVIDER_SITE_OTHER): Payer: Medicare Other

## 2022-03-12 DIAGNOSIS — E559 Vitamin D deficiency, unspecified: Secondary | ICD-10-CM | POA: Diagnosis not present

## 2022-03-12 DIAGNOSIS — I10 Essential (primary) hypertension: Secondary | ICD-10-CM | POA: Diagnosis not present

## 2022-03-12 DIAGNOSIS — E7800 Pure hypercholesterolemia, unspecified: Secondary | ICD-10-CM

## 2022-03-12 LAB — CBC WITH DIFFERENTIAL/PLATELET
Basophils Absolute: 0 10*3/uL (ref 0.0–0.1)
Basophils Relative: 0.7 % (ref 0.0–3.0)
Eosinophils Absolute: 0.2 10*3/uL (ref 0.0–0.7)
Eosinophils Relative: 3.4 % (ref 0.0–5.0)
HCT: 40.4 % (ref 36.0–46.0)
Hemoglobin: 13.7 g/dL (ref 12.0–15.0)
Lymphocytes Relative: 20.6 % (ref 12.0–46.0)
Lymphs Abs: 1.2 10*3/uL (ref 0.7–4.0)
MCHC: 33.8 g/dL (ref 30.0–36.0)
MCV: 91.6 fl (ref 78.0–100.0)
Monocytes Absolute: 0.4 10*3/uL (ref 0.1–1.0)
Monocytes Relative: 6.6 % (ref 3.0–12.0)
Neutro Abs: 4.1 10*3/uL (ref 1.4–7.7)
Neutrophils Relative %: 68.7 % (ref 43.0–77.0)
Platelets: 210 10*3/uL (ref 150.0–400.0)
RBC: 4.4 Mil/uL (ref 3.87–5.11)
RDW: 13.1 % (ref 11.5–15.5)
WBC: 5.9 10*3/uL (ref 4.0–10.5)

## 2022-03-12 LAB — TSH: TSH: 1.58 u[IU]/mL (ref 0.35–5.50)

## 2022-03-12 LAB — HEPATIC FUNCTION PANEL
ALT: 17 U/L (ref 0–35)
AST: 18 U/L (ref 0–37)
Albumin: 4.1 g/dL (ref 3.5–5.2)
Alkaline Phosphatase: 77 U/L (ref 39–117)
Bilirubin, Direct: 0.1 mg/dL (ref 0.0–0.3)
Total Bilirubin: 0.5 mg/dL (ref 0.2–1.2)
Total Protein: 6.3 g/dL (ref 6.0–8.3)

## 2022-03-12 LAB — LIPID PANEL
Cholesterol: 162 mg/dL (ref 0–200)
HDL: 52.7 mg/dL (ref >39.00)
LDL Cholesterol: 77 mg/dL (ref 0–99)
NonHDL: 109.13
Total CHOL/HDL Ratio: 3
Triglycerides: 160 mg/dL — ABNORMAL HIGH (ref 0.0–149.0)
VLDL: 32 mg/dL (ref 0.0–40.0)

## 2022-03-12 LAB — BASIC METABOLIC PANEL
BUN: 16 mg/dL (ref 6–23)
CO2: 31 mEq/L (ref 19–32)
Calcium: 9.5 mg/dL (ref 8.4–10.5)
Chloride: 106 mEq/L (ref 96–112)
Creatinine, Ser: 0.78 mg/dL (ref 0.40–1.20)
GFR: 73.93 mL/min (ref >60.00)
Glucose, Bld: 87 mg/dL (ref 70–99)
Potassium: 3.8 mEq/L (ref 3.5–5.1)
Sodium: 142 mEq/L (ref 135–145)

## 2022-03-12 LAB — VITAMIN D 25 HYDROXY (VIT D DEFICIENCY, FRACTURES): VITD: 21.67 ng/mL — ABNORMAL LOW (ref 30.00–100.00)

## 2022-03-17 ENCOUNTER — Encounter: Payer: Self-pay | Admitting: Emergency Medicine

## 2022-03-17 ENCOUNTER — Other Ambulatory Visit: Payer: Self-pay

## 2022-03-17 ENCOUNTER — Observation Stay
Admission: EM | Admit: 2022-03-17 | Discharge: 2022-03-18 | Disposition: A | Discharge status: Home / Self Care | Payer: Medicare Other | Attending: Internal Medicine | Admitting: Internal Medicine

## 2022-03-17 ENCOUNTER — Telehealth: Payer: Self-pay

## 2022-03-17 ENCOUNTER — Ambulatory Visit (INDEPENDENT_AMBULATORY_CARE_PROVIDER_SITE_OTHER): Payer: Medicare Other | Admitting: Internal Medicine

## 2022-03-17 ENCOUNTER — Emergency Department: Payer: Medicare Other

## 2022-03-17 ENCOUNTER — Encounter: Payer: Self-pay | Admitting: Internal Medicine

## 2022-03-17 ENCOUNTER — Telehealth: Payer: Self-pay | Admitting: Internal Medicine

## 2022-03-17 VITALS — BP 112/68 | HR 68 | Temp 97.9°F | Ht 62.0 in | Wt 124.6 lb

## 2022-03-17 DIAGNOSIS — S32110A Nondisplaced Zone I fracture of sacrum, initial encounter for closed fracture: Secondary | ICD-10-CM | POA: Insufficient documentation

## 2022-03-17 DIAGNOSIS — R110 Nausea: Secondary | ICD-10-CM

## 2022-03-17 DIAGNOSIS — R4182 Altered mental status, unspecified: Secondary | ICD-10-CM | POA: Insufficient documentation

## 2022-03-17 DIAGNOSIS — E7800 Pure hypercholesterolemia, unspecified: Secondary | ICD-10-CM

## 2022-03-17 DIAGNOSIS — R0981 Nasal congestion: Secondary | ICD-10-CM

## 2022-03-17 DIAGNOSIS — Z8249 Family history of ischemic heart disease and other diseases of the circulatory system: Secondary | ICD-10-CM | POA: Diagnosis not present

## 2022-03-17 DIAGNOSIS — N281 Cyst of kidney, acquired: Secondary | ICD-10-CM | POA: Diagnosis not present

## 2022-03-17 DIAGNOSIS — I2510 Atherosclerotic heart disease of native coronary artery without angina pectoris: Secondary | ICD-10-CM | POA: Diagnosis not present

## 2022-03-17 DIAGNOSIS — I10 Essential (primary) hypertension: Secondary | ICD-10-CM | POA: Diagnosis not present

## 2022-03-17 DIAGNOSIS — M545 Low back pain, unspecified: Secondary | ICD-10-CM | POA: Insufficient documentation

## 2022-03-17 DIAGNOSIS — K449 Diaphragmatic hernia without obstruction or gangrene: Secondary | ICD-10-CM | POA: Diagnosis not present

## 2022-03-17 DIAGNOSIS — K8020 Calculus of gallbladder without cholecystitis without obstruction: Secondary | ICD-10-CM | POA: Insufficient documentation

## 2022-03-17 DIAGNOSIS — J069 Acute upper respiratory infection, unspecified: Secondary | ICD-10-CM | POA: Diagnosis not present

## 2022-03-17 DIAGNOSIS — Z792 Long term (current) use of antibiotics: Secondary | ICD-10-CM | POA: Insufficient documentation

## 2022-03-17 DIAGNOSIS — E876 Hypokalemia: Secondary | ICD-10-CM | POA: Diagnosis not present

## 2022-03-17 DIAGNOSIS — R1110 Vomiting, unspecified: Secondary | ICD-10-CM | POA: Diagnosis not present

## 2022-03-17 DIAGNOSIS — D72829 Elevated white blood cell count, unspecified: Secondary | ICD-10-CM | POA: Diagnosis not present

## 2022-03-17 DIAGNOSIS — X58XXXA Exposure to other specified factors, initial encounter: Secondary | ICD-10-CM | POA: Diagnosis not present

## 2022-03-17 DIAGNOSIS — J9811 Atelectasis: Secondary | ICD-10-CM | POA: Diagnosis not present

## 2022-03-17 DIAGNOSIS — J9601 Acute respiratory failure with hypoxia: Secondary | ICD-10-CM | POA: Diagnosis not present

## 2022-03-17 DIAGNOSIS — N3289 Other specified disorders of bladder: Secondary | ICD-10-CM | POA: Diagnosis not present

## 2022-03-17 DIAGNOSIS — R6883 Chills (without fever): Secondary | ICD-10-CM | POA: Insufficient documentation

## 2022-03-17 DIAGNOSIS — R778 Other specified abnormalities of plasma proteins: Secondary | ICD-10-CM | POA: Insufficient documentation

## 2022-03-17 DIAGNOSIS — F439 Reaction to severe stress, unspecified: Secondary | ICD-10-CM

## 2022-03-17 DIAGNOSIS — Z7951 Long term (current) use of inhaled steroids: Secondary | ICD-10-CM | POA: Insufficient documentation

## 2022-03-17 DIAGNOSIS — Z9109 Other allergy status, other than to drugs and biological substances: Secondary | ICD-10-CM | POA: Insufficient documentation

## 2022-03-17 DIAGNOSIS — J3489 Other specified disorders of nose and nasal sinuses: Secondary | ICD-10-CM | POA: Insufficient documentation

## 2022-03-17 DIAGNOSIS — R112 Nausea with vomiting, unspecified: Secondary | ICD-10-CM

## 2022-03-17 DIAGNOSIS — I700 Atherosclerosis of aorta: Secondary | ICD-10-CM | POA: Diagnosis not present

## 2022-03-17 DIAGNOSIS — R0902 Hypoxemia: Secondary | ICD-10-CM | POA: Diagnosis not present

## 2022-03-17 DIAGNOSIS — J984 Other disorders of lung: Secondary | ICD-10-CM | POA: Insufficient documentation

## 2022-03-17 DIAGNOSIS — G501 Atypical facial pain: Secondary | ICD-10-CM | POA: Insufficient documentation

## 2022-03-17 DIAGNOSIS — E559 Vitamin D deficiency, unspecified: Secondary | ICD-10-CM

## 2022-03-17 DIAGNOSIS — Z8616 Personal history of COVID-19: Secondary | ICD-10-CM

## 2022-03-17 DIAGNOSIS — N390 Urinary tract infection, site not specified: Secondary | ICD-10-CM

## 2022-03-17 DIAGNOSIS — R791 Abnormal coagulation profile: Secondary | ICD-10-CM | POA: Diagnosis not present

## 2022-03-17 DIAGNOSIS — Z87898 Personal history of other specified conditions: Secondary | ICD-10-CM

## 2022-03-17 DIAGNOSIS — Z20822 Contact with and (suspected) exposure to covid-19: Secondary | ICD-10-CM | POA: Insufficient documentation

## 2022-03-17 DIAGNOSIS — J988 Other specified respiratory disorders: Secondary | ICD-10-CM

## 2022-03-17 DIAGNOSIS — Z79899 Other long term (current) drug therapy: Secondary | ICD-10-CM | POA: Insufficient documentation

## 2022-03-17 DIAGNOSIS — D219 Benign neoplasm of connective and other soft tissue, unspecified: Secondary | ICD-10-CM

## 2022-03-17 DIAGNOSIS — R519 Headache, unspecified: Secondary | ICD-10-CM | POA: Insufficient documentation

## 2022-03-17 LAB — COMPREHENSIVE METABOLIC PANEL
ALT: 24 U/L (ref 0–44)
AST: 25 U/L (ref 15–41)
Albumin: 4.5 g/dL (ref 3.5–5.0)
Alkaline Phosphatase: 82 U/L (ref 38–126)
Anion gap: 12 (ref 5–15)
BUN: 18 mg/dL (ref 8–23)
CO2: 23 mmol/L (ref 22–32)
Calcium: 10 mg/dL (ref 8.9–10.3)
Chloride: 102 mmol/L (ref 98–111)
Creatinine, Ser: 0.71 mg/dL (ref 0.44–1.00)
GFR, Estimated: >60 mL/min (ref >60)
Glucose, Bld: 150 mg/dL — ABNORMAL HIGH (ref 70–99)
Potassium: 3.6 mmol/L (ref 3.5–5.1)
Sodium: 137 mmol/L (ref 135–145)
Total Bilirubin: 1 mg/dL (ref 0.3–1.2)
Total Protein: 7.5 g/dL (ref 6.5–8.1)

## 2022-03-17 LAB — TROPONIN I (HIGH SENSITIVITY)
Troponin I (High Sensitivity): 5 ng/L (ref <18)
Troponin I (High Sensitivity): 6 ng/L (ref <18)

## 2022-03-17 LAB — URINALYSIS, ROUTINE W REFLEX MICROSCOPIC
Bacteria, UA: NONE SEEN
Bilirubin Urine: NEGATIVE
Glucose, UA: NEGATIVE mg/dL
Ketones, ur: 5 mg/dL — AB
Nitrite: NEGATIVE
Protein, ur: NEGATIVE mg/dL
Specific Gravity, Urine: >1.046 — ABNORMAL HIGH (ref 1.005–1.030)
pH: 7 (ref 5.0–8.0)

## 2022-03-17 LAB — CBC WITH DIFFERENTIAL/PLATELET
Abs Immature Granulocytes: 0.09 10*3/uL — ABNORMAL HIGH (ref 0.00–0.07)
Basophils Absolute: 0 10*3/uL (ref 0.0–0.1)
Basophils Relative: 0 %
Eosinophils Absolute: 0.1 10*3/uL (ref 0.0–0.5)
Eosinophils Relative: 1 %
HCT: 43.3 % (ref 36.0–46.0)
Hemoglobin: 14.5 g/dL (ref 12.0–15.0)
Immature Granulocytes: 1 %
Lymphocytes Relative: 2 %
Lymphs Abs: 0.4 10*3/uL — ABNORMAL LOW (ref 0.7–4.0)
MCH: 30.5 pg (ref 26.0–34.0)
MCHC: 33.5 g/dL (ref 30.0–36.0)
MCV: 91 fL (ref 80.0–100.0)
Monocytes Absolute: 0.4 10*3/uL (ref 0.1–1.0)
Monocytes Relative: 2 %
Neutro Abs: 15 10*3/uL — ABNORMAL HIGH (ref 1.7–7.7)
Neutrophils Relative %: 94 %
Platelets: 226 10*3/uL (ref 150–400)
RBC: 4.76 MIL/uL (ref 3.87–5.11)
RDW: 12.9 % (ref 11.5–15.5)
WBC: 16 10*3/uL — ABNORMAL HIGH (ref 4.0–10.5)
nRBC: 0 % (ref 0.0–0.2)

## 2022-03-17 LAB — CBC
HCT: 42.9 % (ref 36.0–46.0)
Hemoglobin: 14.4 g/dL (ref 12.0–15.0)
MCH: 30.4 pg (ref 26.0–34.0)
MCHC: 33.6 g/dL (ref 30.0–36.0)
MCV: 90.5 fL (ref 80.0–100.0)
Platelets: 222 10*3/uL (ref 150–400)
RBC: 4.74 MIL/uL (ref 3.87–5.11)
RDW: 12.9 % (ref 11.5–15.5)
WBC: 16.1 10*3/uL — ABNORMAL HIGH (ref 4.0–10.5)
nRBC: 0 % (ref 0.0–0.2)

## 2022-03-17 LAB — RESP PANEL BY RT-PCR (FLU A&B, COVID) ARPGX2
Influenza A by PCR: NEGATIVE
Influenza B by PCR: NEGATIVE
SARS Coronavirus 2 by RT PCR: NEGATIVE

## 2022-03-17 LAB — PROTIME-INR
INR: 1 (ref 0.8–1.2)
Prothrombin Time: 13.6 seconds (ref 11.4–15.2)

## 2022-03-17 LAB — APTT: aPTT: 25 seconds (ref 24–36)

## 2022-03-17 LAB — D-DIMER, QUANTITATIVE: D-Dimer, Quant: 1.8 ug/mL-FEU — ABNORMAL HIGH (ref 0.00–0.50)

## 2022-03-17 LAB — PROCALCITONIN: Procalcitonin: 0.11 ng/mL

## 2022-03-17 LAB — LIPASE, BLOOD: Lipase: 30 U/L (ref 11–51)

## 2022-03-17 MED ORDER — ONDANSETRON HCL 4 MG PO TABS: 4.0000 mg | ORAL_TABLET | Freq: Four times a day (QID) | ORAL | Status: DC | PRN

## 2022-03-17 MED ORDER — ONDANSETRON HCL 4 MG/2ML IJ SOLN
4.0000 mg | Freq: Once | INTRAMUSCULAR | Status: AC | PRN
  Administered 2022-03-17: 4 mg via INTRAVENOUS
  Filled 2022-03-17: qty 2

## 2022-03-17 MED ORDER — DOXYCYCLINE HYCLATE 100 MG PO TABS
100.0000 mg | ORAL_TABLET | Freq: Two times a day (BID) | ORAL | 0 refills | Status: DC
Start: 2022-03-17 — End: 2022-03-24

## 2022-03-17 MED ORDER — LOSARTAN POTASSIUM 50 MG PO TABS
100.0000 mg | ORAL_TABLET | Freq: Every day | ORAL | Status: DC
  Filled 2022-03-17: qty 2

## 2022-03-17 MED ORDER — LACTATED RINGERS IV BOLUS
1000.0000 mL | Freq: Once | INTRAVENOUS | Status: AC
Start: 2022-03-17 — End: 2022-03-17
  Administered 2022-03-17: 1000 mL via INTRAVENOUS

## 2022-03-17 MED ORDER — SODIUM CHLORIDE 0.9 % IV SOLN: INTRAVENOUS | Status: DC

## 2022-03-17 MED ORDER — AMLODIPINE BESYLATE 5 MG PO TABS
5.0000 mg | ORAL_TABLET | Freq: Every day | ORAL | Status: DC
  Filled 2022-03-17: qty 1

## 2022-03-17 MED ORDER — ATORVASTATIN CALCIUM 20 MG PO TABS
40.0000 mg | ORAL_TABLET | Freq: Every day | ORAL | Status: DC
  Administered 2022-03-18: 40 mg via ORAL
  Filled 2022-03-17 (×2): qty 2

## 2022-03-17 MED ORDER — ENOXAPARIN SODIUM 40 MG/0.4ML IJ SOSY
40.0000 mg | PREFILLED_SYRINGE | INTRAMUSCULAR | Status: DC
  Administered 2022-03-18: 40 mg via SUBCUTANEOUS
  Filled 2022-03-17: qty 0.4

## 2022-03-17 MED ORDER — SODIUM CHLORIDE 0.9 % IV SOLN: 2.0000 g | INTRAVENOUS | Status: DC

## 2022-03-17 MED ORDER — ENOXAPARIN SODIUM 60 MG/0.6ML IJ SOSY
55.0000 mg | PREFILLED_SYRINGE | Freq: Once | INTRAMUSCULAR | Status: AC
  Administered 2022-03-17: 55 mg via SUBCUTANEOUS
  Filled 2022-03-17: qty 0.6

## 2022-03-17 MED ORDER — IOHEXOL 300 MG/ML  SOLN
100.0000 mL | Freq: Once | INTRAMUSCULAR | Status: AC | PRN
  Administered 2022-03-17: 100 mL via INTRAVENOUS

## 2022-03-17 MED ORDER — ACETAMINOPHEN 325 MG RE SUPP: 650.0000 mg | Freq: Four times a day (QID) | RECTAL | Status: DC | PRN

## 2022-03-17 MED ORDER — LORATADINE 10 MG PO TABS
10.0000 mg | ORAL_TABLET | Freq: Every day | ORAL | Status: DC
  Administered 2022-03-18: 10 mg via ORAL
  Filled 2022-03-17: qty 1

## 2022-03-17 MED ORDER — LACTATED RINGERS IV BOLUS
1000.0000 mL | Freq: Once | INTRAVENOUS | Status: AC
Start: 2022-03-17 — End: 2022-03-18
  Administered 2022-03-17: 1000 mL via INTRAVENOUS

## 2022-03-17 MED ORDER — ONDANSETRON HCL 4 MG/2ML IJ SOLN
4.0000 mg | Freq: Once | INTRAMUSCULAR | Status: AC
  Administered 2022-03-17: 4 mg via INTRAVENOUS
  Filled 2022-03-17: qty 2

## 2022-03-17 MED ORDER — ACETAMINOPHEN 325 MG PO TABS
650.0000 mg | ORAL_TABLET | Freq: Four times a day (QID) | ORAL | Status: DC | PRN
  Administered 2022-03-17: 650 mg via ORAL
  Filled 2022-03-17: qty 2

## 2022-03-17 MED ORDER — ONDANSETRON HCL 4 MG/2ML IJ SOLN: 4.0000 mg | Freq: Four times a day (QID) | INTRAMUSCULAR | Status: DC | PRN

## 2022-03-17 MED ORDER — LEVOFLOXACIN IN D5W 500 MG/100ML IV SOLN
500.0000 mg | Freq: Once | INTRAVENOUS | Status: AC
  Administered 2022-03-17: 500 mg via INTRAVENOUS
  Filled 2022-03-17: qty 100

## 2022-03-17 MED ORDER — PROCHLORPERAZINE EDISYLATE 10 MG/2ML IJ SOLN
5.0000 mg | Freq: Once | INTRAMUSCULAR | Status: AC
  Administered 2022-03-17: 5 mg via INTRAVENOUS
  Filled 2022-03-17: qty 2

## 2022-03-17 NOTE — Assessment & Plan Note (Signed)
Continue lipitor.  Low cholesterol diet and exercise.  Follow lipid panel and liver function tests.   

## 2022-03-17 NOTE — ED Triage Notes (Signed)
Pt to ED via POV with c/o chills and N/V that began today at 1400. She had an appointment today at 1000 ( before getting  the N/V) and they thought that it was sinus drainage and put her on Amoxicillin  that she has not taken yet.  ?

## 2022-03-17 NOTE — Telephone Encounter (Signed)
S/W Caryl Pina at [facility] ER Triage - advised pt on her way, gave report for pt -  ? ?Vomiting, Weakness, Chills, Shaking ?

## 2022-03-17 NOTE — Telephone Encounter (Signed)
The patient is sick vomiting and weak would like to be tested for Covid. Patient stated she is a lot worse. ?

## 2022-03-17 NOTE — Assessment & Plan Note (Signed)
Recent vitamin d level low.  Start vitamin D3 1000 units per day.   ?

## 2022-03-17 NOTE — ED Provider Notes (Signed)
? ?[facility] ?Provider Note ? ? ? Event Date/Time  ? First MD Initiated Contact with Patient 03/17/22 1654   ?  (approximate) ? ? ?History  ? ?Nausea, Emesis, and Chills ? ? ?HPI ? ?Jamie Elliott is a 76 y.o. female patient reports chills and nausea and vomiting since 2 AM.  She had an appointment today at.[email or web address] ?And was put on amoxicillin for what was thought to be a sinus infection she has some drainage and a little bit of facial pain.  Her only other complaint is pain over her tailbone which is fairly bad.  She has no chest pain or tightness no coughing no abdominal pain no dysuria urgency or frequency.  She has no fever but she does have chills. ? ?Past medical history: High cholesterol hypertension and abnormal liver function test ? ? ?Physical Exam  ? ?Triage Vital Signs: ?ED Triage Vitals  ?Enc Vitals Group  ?   BP 03/17/22 1614 128/68  ?   Pulse Rate 03/17/22 1614 93  ?   Resp 03/17/22 1614 18  ?   Temp 03/17/22 1614 98.5 ?F (36.9 ?C)  ?   Temp Source 03/17/22 1614 Oral  ?   SpO2 03/17/22 1614 97 %  ?   Weight --   ?   Height 03/17/22 1617 '5\' 2"'$  (1.575 m)  ?   Head Circumference --   ?   Peak Flow --   ?   Pain Score 03/17/22 1617 5  ?   Pain Loc --   ?   Pain Edu? --   ?   Excl. in [place]? --   ? ? ?Most recent vital signs: ?Vitals:  ? 03/17/22 2042 03/17/22 2056  ?BP:    ?Pulse: 81 89  ?Resp: 15 16  ?Temp:    ?SpO2: (!) 89% 96%  ? ? ? ?General: Awake, alert, with many dry heaves and weakness ?Head: Normocephalic atraumatic patient does report some mild frontal sinus tenderness and tenderness over the nasal bridge. ?CV:  Good peripheral perfusion.  Heart regular rate and rhythm no audible murmurs ?Resp:  Normal effort.  Lungs are clear ?Abd:  No distention.  Abdomen soft bowel sounds are positive there is no tenderness.  There is no CVA tenderness either. ?Extremities: No edema ? ? ?ED Results / Procedures / Treatments  ? ?Labs ?(all labs ordered are listed, but only abnormal results are  displayed) ?Labs Reviewed  ?COMPREHENSIVE METABOLIC PANEL - Abnormal; Notable for the following components:  ?    Result Value  ? Glucose, Bld 150 (*)   ? All other components within normal limits  ?CBC - Abnormal; Notable for the following components:  ? WBC 16.1 (*)   ? All other components within normal limits  ?CBC WITH DIFFERENTIAL/PLATELET - Abnormal; Notable for the following components:  ? WBC 16.0 (*)   ? Neutro Abs 15.0 (*)   ? Lymphs Abs 0.4 (*)   ? Abs Immature Granulocytes 0.09 (*)   ? All other components within normal limits  ?D-DIMER, QUANTITATIVE - Abnormal; Notable for the following components:  ? D-Dimer, Quant 1.80 (*)   ? All other components within normal limits  ?RESP PANEL BY RT-PCR (FLU A&B, COVID) ARPGX2  ?LIPASE, BLOOD  ?URINALYSIS, ROUTINE W REFLEX MICROSCOPIC  ?PROTIME-INR  ?APTT  ?TROPONIN I (HIGH SENSITIVITY)  ?TROPONIN I (HIGH SENSITIVITY)  ? ? ? ?EKG ? ?EKG read interpreted by me shows normal sinus rhythm rate of 90 normal axis essentially  normal EKG although there is low voltage in the precordial leads. ? ? ?RADIOLOGY ?CT of the head read by radiology films reviewed by me shows no acute disease and no sinus disease ? ?CT of the abdomen and pelvis read by radiology films reviewed by me is read as no acute abdomen abdominal pelvic abnormality.  There is a note there that says there is evidence of a nondisplaced left sacral alla fracture not sure that I see it.  Patient reports she has not fallen anytime recently and is not having pain in that area. ? ? ?PROCEDURES: ? ?Critical Care performed: Critical care time 40 minutes.  This includes evaluating the patient initially and then a second time when she gets hypoxic and a third time after that.  Additionally I have spent a good deal of time reviewing her studies and speaking to CT about her. ?Procedures ? ? ?MEDICATIONS ORDERED IN ED: ?Medications  ?levofloxacin (LEVAQUIN) IVPB 500 mg (has no administration in time range)  ?ondansetron  [facility]) injection 4 mg (4 mg Intravenous Given 03/17/22 1623)  ?lactated ringers bolus 1,000 mL (0 mLs Intravenous Stopped 03/17/22 1905)  ?ondansetron [facility]) injection 4 mg (4 mg Intravenous Given 03/17/22 1804)  ?iohexol (OMNIPAQUE) 300 MG/ML solution 100 mL (100 mLs Intravenous Contrast Given 03/17/22 1744)  ?prochlorperazine (COMPAZINE) injection 5 mg (5 mg Intravenous Given 03/17/22 1846)  ? ? ? ?IMPRESSION / MDM / ASSESSMENT AND PLAN / ED COURSE  ?I reviewed the triage vital signs and the nursing notes. ?----------------------------------------- ?7:36 PM on 03/17/2022 ?----------------------------------------- ?Patient's O2 sats have dropped into the 80s.  To go back up with oxygen.  Patient is no longer nauseated or vomiting but now hypoxic.  We will get a chest x-ray now. ? ?----------------------------------------- ?8:57 PM on 03/17/2022 ?----------------------------------------- ?Chest x-ray really does not show much per my reading. ?Patient's D-dimer is elevated she becomes hypoxic again when the oxygen was taken off.  EKG does not show anything suspicious.  Patient is hypoxic.  I did the IV contrast for her abdominal CT.  I will have to wait till tomorrow to get a CT angio.  I will call the hospitalist.  I am of course worried about pulmonary embolus in this patient with her hypoxia and positive D-dimer.  Everything else seems to be okay.  It is possible that she has a small pneumonia that is just not showing up on chest x-ray as some many small pneumonia is doing especially in the elderly.  I will give her some antibiotics just in case for possible community-acquired pneumonia and I will speak to the hospitalist about the possibility of anticoagulating her or not. ? ?The patient is on the cardiac monitor to evaluate for evidence of arrhythmia and/or significant heart rate changes.  None were seen ? ?  ? ? ?FINAL CLINICAL IMPRESSION(S) / ED DIAGNOSES  ? ?Final diagnoses:  ?Chills  ?Nausea  ?Nausea and  vomiting, unspecified vomiting type  ?Hypoxia  ? ? ? ?Rx / DC Orders  ? ?ED Discharge Orders   ? ? None  ? ?  ? ? ? ?Note:  This document was prepared using Dragon voice recognition software and may include unintentional dictation errors. ?  ?Nena Polio, MD ?03/17/22 2102 ? ?

## 2022-03-17 NOTE — Assessment & Plan Note (Signed)
Saw urology.  Stable.  Recommended f/u ultrasound in 05/2022.   ?

## 2022-03-17 NOTE — ED Notes (Signed)
Spoke with lab and will add on troponin. ?

## 2022-03-17 NOTE — Progress Notes (Signed)
Mammo abstracted  

## 2022-03-17 NOTE — Assessment & Plan Note (Signed)
Continue home amlodipine and losartan ?

## 2022-03-17 NOTE — Assessment & Plan Note (Signed)
Saw Dr Stasia Cavalier.  Per her report, recommended f/u mammogram in one year. Due 02/2022. Mammogram 02/23/22 - [facility].   ?

## 2022-03-17 NOTE — Assessment & Plan Note (Signed)
Increased sinus pressure and drainage.  Fatigue.  States feels c/w her previous sinus infections.  Continue saline nasal spray.  nasacort nasal spray.  Doxycycline as directed.  Continue probiotic.  Follow closely.  Call with update.   ?

## 2022-03-17 NOTE — Progress Notes (Signed)
Patient ID: Jamie Elliott, female   DOB: 12-12-1946, 76 y.o.   MRN: [id number] ? ? ?Subjective:  ? ? Patient ID: Jamie Elliott, female    DOB: 1946-01-22, 76 y.o.   MRN: [id number] ? ?This visit occurred during the SARS-CoV-2 public health emergency.  Safety protocols were in place, including screening questions prior to the visit, additional usage of staff PPE, and extensive cleaning of exam room while observing appropriate contact time as indicated for disinfecting solutions.  ? ?Patient here for a scheduled follow up.  ? ?Chief Complaint  ?Patient presents with  ? Follow-up  ?  4 mo f/u - HTN, hyperlipidemia. Pt c/o bad allergies. Sinus pressure, fatigue. Pt stated does not feel sick, has seasonal allergies.   ? Acute Visit  ?  Pt called in stating now vomiting, feeling much worse since visit today. Wants COVID testing. Pt advised to come around back for swab.   ? .  ? ?HPI ?States she worked out in her garden Friday 03/13/22.  Noticed increased drainage and sinus pressure.  Ears plugged (right more).  Increased fatigue.  No fever.  Minimal nausea.  No sore throat.  No chest congestion or cough.  No chest pain or sob.  Feels like her typical sinus infections.  No acid reflux.  No abdominal pain or bowel change.  No vomiting.  Using saline nasal spray and tylenol.  Also using nasacort.  Taking antihistamine.  She also had questions about her left great toe - minimal change in distal part of toe nail.  No pain.  No other nail changes.  She saw ortho.  Has orthotics now.  Doing better.  Saw ENT - Dr Richardson Landry - persistent mouth irritation.  Using Dukes mouthwash as needed.  Prescribed alpha lipoic acid.  Taking.  Mouth is better.  Discussed labs.  Discussed the need to take vitamin d.   ? ? ?Past Medical History:  ?Diagnosis Date  ? Environmental allergies   ? Hypercholesterolemia   ? Hypertension   ? Migraines   ? ?Past Surgical History:  ?Procedure Laterality Date  ? ABDOMINAL HYSTERECTOMY  2000  ? APPENDECTOMY  2000  ?  CATARACT EXTRACTION W/PHACO Left 04/23/2021  ? Procedure: CATARACT EXTRACTION PHACO AND INTRAOCULAR LENS PLACEMENT (IOC) LEFT panoptix toric 10.41 01:22.8 12.6%;  Surgeon: Leandrew Koyanagi, MD;  Location: [place];  Service: Ophthalmology;  Laterality: Left;  ? CATARACT EXTRACTION W/PHACO Right 05/07/2021  ? Procedure: CATARACT EXTRACTION PHACO AND INTRAOCULAR LENS PLACEMENT (IOC) RIGHT  panoptix 12.85 01:33.8 13.7%;  Surgeon: Leandrew Koyanagi, MD;  Location: [place];  Service: Ophthalmology;  Laterality: Right;  ? NOSE SURGERY  1967  ? SEPTOPLASTY    ? SHOULDER ARTHROSCOPY WITH OPEN ROTATOR CUFF REPAIR Right 12/05/2015  ? Procedure: SHOULDER ARTHROSCOPY WITH MINI OPEN ROTATOR CUFF REPAIR. DISTAL CLAVICLE EXCISION;  Surgeon: Thornton Park, MD;  Location: [facility];  Service: Orthopedics;  Laterality: Right;  ? ?Family History  ?Problem Relation Age of Onset  ? Heart disease Mother   ? Hyperlipidemia Mother   ? Hypertension Mother   ? Cancer Father   ?     lung, prostate  ? Hyperlipidemia Father   ? Hypertension Father   ? Prostate cancer Father   ? Bladder Cancer Neg Hx   ? Kidney cancer Neg Hx   ? ?Social History  ? ?Socioeconomic History  ? Marital status: Married  ?  Spouse name: Not on file  ? Number of children: 2  ?  Years of education: Not on file  ? Highest education level: Not on file  ?Occupational History  ? Not on file  ?Tobacco Use  ? Smoking status: Never  ? Smokeless tobacco: Never  ?Vaping Use  ? Vaping Use: Never used  ?Substance and Sexual Activity  ? Alcohol use: No  ?  Alcohol/week: 0.0 standard drinks  ? Drug use: No  ? Sexual activity: Not Currently  ?Other Topics Concern  ? Not on file  ?Social History Narrative  ? Not on file  ? ?Social Determinants of Health  ? ?Financial Resource Strain: Low Risk   ? Difficulty of Paying Living Expenses: Not hard at all  ?Food Insecurity: No Food Insecurity  ? Worried About Charity fundraiser in the Last Year: Never true  ? Ran  Out of Food in the Last Year: Never true  ?Transportation Needs: No Transportation Needs  ? Lack of Transportation (Medical): No  ? Lack of Transportation (Non-Medical): No  ?Physical Activity: Sufficiently Active  ? Days of Exercise per Week: 5 days  ? Minutes of Exercise per Session: 50 min  ?Stress: No Stress Concern Present  ? Feeling of Stress : Not at all  ?Social Connections: Unknown  ? Frequency of Communication with Friends and Family: More than three times a week  ? Frequency of Social Gatherings with Friends and Family: More than three times a week  ? Attends Religious Services: Not on file  ? Active Member of Clubs or Organizations: Not on file  ? Attends Archivist Meetings: Not on file  ? Marital Status: Married  ? ? ? ?Review of Systems  ?Constitutional:  Positive for fatigue. Negative for fever.  ?HENT:  Positive for congestion, postnasal drip and sinus pressure. Negative for sore throat.   ?Respiratory:  Negative for cough, chest tightness and shortness of breath.   ?Cardiovascular:  Negative for chest pain, palpitations and leg swelling.  ?Gastrointestinal:  Negative for abdominal pain, diarrhea and vomiting.  ?     Minimal nausea.   ?Genitourinary:  Negative for difficulty urinating and dysuria.  ?Musculoskeletal:  Negative for joint swelling and myalgias.  ?Neurological:  Negative for dizziness, light-headedness and headaches.  ?Psychiatric/Behavioral:  Negative for agitation and dysphoric mood.   ? ?   ?Objective:  ?  ? ?BP 112/68 (BP Location: Left Arm, Patient Position: Sitting, Cuff Size: Small)   Pulse 68   Temp 97.9 ?F (36.6 ?C) (Oral)   Ht '5\' 2"'$  (1.575 m)   Wt 124 lb 9.6 oz (56.5 kg)   LMP 11/28/1998   SpO2 99%   BMI 22.79 kg/m?  ?Wt Readings from Last 3 Encounters:  ?03/17/22 124 lb 9.6 oz (56.5 kg)  ?12/11/21 123 lb (55.8 kg)  ?12/10/21 123 lb (55.8 kg)  ? ? ?Physical Exam ?Vitals reviewed.  ?Constitutional:   ?   General: She is not in acute distress. ?   Appearance:  Normal appearance.  ?HENT:  ?   Head: Normocephalic and atraumatic.  ?   Right Ear: Tympanic membrane and external ear normal.  ?   Left Ear: Tympanic membrane and external ear normal.  ?Eyes:  ?   General: No scleral icterus.    ?   Right eye: No discharge.     ?   Left eye: No discharge.  ?   Conjunctiva/sclera: Conjunctivae normal.  ?Neck:  ?   Thyroid: No thyromegaly.  ?Cardiovascular:  ?   Rate and Rhythm: Normal rate and regular rhythm.  ?  Pulmonary:  ?   Effort: No respiratory distress.  ?   Breath sounds: Normal breath sounds. No wheezing.  ?Abdominal:  ?   General: Bowel sounds are normal.  ?   Palpations: Abdomen is soft.  ?   Tenderness: There is no abdominal tenderness.  ?Musculoskeletal:     ?   General: No swelling or tenderness.  ?   Cervical back: Neck supple. No tenderness.  ?Lymphadenopathy:  ?   Cervical: No cervical adenopathy.  ?Skin: ?   Findings: No erythema or rash.  ?Neurological:  ?   Mental Status: She is alert.  ?Psychiatric:     ?   Mood and Affect: Mood normal.     ?   Behavior: Behavior normal.  ? ? ? ?Outpatient Encounter Medications as of 03/17/2022  ?Medication Sig  ? acetaminophen (TYLENOL) 500 MG tablet Take 500 mg by mouth every 6 (six) hours as needed.  ? amLODipine (NORVASC) 5 MG tablet Take 1 tablet (5 mg total) by mouth daily.  ? Ascorbic Acid (VITAMIN C) 1000 MG tablet Take 1,000 mg by mouth daily.  ? atorvastatin (LIPITOR) 40 MG tablet Take 1 tablet (40 mg total) by mouth daily.  ? cetirizine (ZYRTEC) 10 MG tablet Take 1 tablet (10 mg total) by mouth daily.  ? Cholecalciferol (VITAMIN D3) 25 MCG (1000 UT) CAPS Take 1 capsule by mouth daily.  ? CRANBERRY EXTRACT PO Take 1 capsule by mouth 2 (two) times daily.  ? D-Mannose 500 MG CAPS Take 500 mg by mouth in the morning and at bedtime.  ? doxycycline (VIBRA-TABS) 100 MG tablet Take 1 tablet (100 mg total) by mouth 2 (two) times daily.  ? hydrochlorothiazide (HYDRODIURIL) 12.5 MG tablet TAKE ONE TABLET DAILY.  ? losartan  (COZAAR) 100 MG tablet Take 1 tablet by mouth daily.  ? nystatin (MYCOSTATIN) 100000 UNIT/ML suspension 5cc's swish and spit tid  ? polyethylene glycol (MIRALAX / GLYCOLAX) 17 g packet Take 17 g by mouth every other

## 2022-03-17 NOTE — Assessment & Plan Note (Signed)
Recent diagnosis - 05/2021.  Treated with paxlovid.   ?

## 2022-03-17 NOTE — Assessment & Plan Note (Signed)
CT abdomen and pelvis with contrast without any acute findings.  Cholelithiasis seen ?Antiemetics and IV hydration ?

## 2022-03-17 NOTE — Assessment & Plan Note (Signed)
Suspect due to respiratory tract infection given complaints of sinus congestion and chills, elevated WBC however chest x-ray clear ?Differential includes PE given elevated D-dimer of 1.8 ?For possible pneumonia: Treat as outlined on the next problem ?For possible PE: Therapeutic dose Lovenox x1 pending possible evaluation with VQ scan in the a.m. ?Supplemental O2 to keep sats over 94% ?

## 2022-03-17 NOTE — Assessment & Plan Note (Signed)
Patient has hypoxia and tachypnea and upper respiratory symptoms ?CT head without evidence of sinusitis and CT abdomen and pelvis with contrast showing no lower respiratory tract infiltrate ?Empiric treatment with Rocephin and azithromycin ?Follow procalcitonin ?

## 2022-03-17 NOTE — Assessment & Plan Note (Signed)
Loratadine and Flonase nasal spray ?No evidence of sinusitis on CT head ?

## 2022-03-17 NOTE — Assessment & Plan Note (Signed)
Seeing urology.  On suppressive abx.  No urinary symptoms.  Follow.  ?

## 2022-03-17 NOTE — Assessment & Plan Note (Signed)
Overall appears to be handling things relatively well.  Will notify me if feels needs anything more.  Follow.   ?

## 2022-03-17 NOTE — Assessment & Plan Note (Signed)
Continue amlodipine and hctz.  Follow pressures.  Follow metabolic panel.  

## 2022-03-17 NOTE — H&P (Signed)
?History and Physical  ? ? ?Patient: Jamie Elliott:[id number] DOB: Jun 11, 1946 ?DOA: 03/17/2022 ?DOS: the patient was seen and examined on 03/17/2022 ?PCP: Einar Pheasant, MD  ?Patient coming from: Home ? ?Chief Complaint:  ?Chief Complaint  ?Patient presents with  ? Nausea  ? Emesis  ? Chills  ? ? ?HPI: Jamie Elliott is a 76 y.o. female with medical history significant for Hypertension, environmental allergies, who presents to the ED with a 1 day history of chills and then nausea and vomiting that started on the day of arrival.  Patient was seen earlier in the day by her PCP and started on Augmentin for possible sinusitis after she complained of chills and congestion.  Following her appointment she developed nausea and vomiting and subsequently presented to the ED.  She denied fevers, abdominal pain, diarrhea and denies chest pain or shortness of breath.  While in the ED she was noted to be intermittently hypoxic to 86 and was placed on O2 at 2 L ?Hospital course: On arrival vitals within normal limits but became tachypneic to 25 with O2 sats dipping to 86 per ED provider with otherwise normal vitals.  Temperature was initially 98.5 going as high as 99.  Blood work significant for WBC of 16,000.  COVID and flu negative.  Troponin 6.  D-dimer was elevated at 1.8.  EKG, personally viewed and interpreted: Sinus at 90 with no acute ST-T wave changes.  CT head showed normal sinuses.  Chest x-ray  unremarkable.  CT abdomen and pelvis with contrast showed no acute abdominopelvic abnormality and no evidence of bowel instruction there was an incidental finding of a nondisplaced left sacral ala fracture.  Also showed cholelithiasis.  The lower chest showed no acute abnormality ? ?The ED provider, Dr. Cinda Quest did consider CTA chest to evaluate for PE but due to recent administration of contrast was unable to do so.  He did contact orthopedics regarding the incidental finding of the sacral ala fracture and was advised that  patient can follow-up as outpatient.  Patient was started on Levaquin and continued on O2 at 2 L.  Hospitalist consulted for admission  ? ?Review of Systems: As mentioned in the history of present illness. All other systems reviewed and are negative. ?Past Medical History:  ?Diagnosis Date  ? Environmental allergies   ? Hypercholesterolemia   ? Hypertension   ? Migraines   ? ?Past Surgical History:  ?Procedure Laterality Date  ? ABDOMINAL HYSTERECTOMY  2000  ? APPENDECTOMY  2000  ? CATARACT EXTRACTION W/PHACO Left 04/23/2021  ? Procedure: CATARACT EXTRACTION PHACO AND INTRAOCULAR LENS PLACEMENT (IOC) LEFT panoptix toric 10.41 01:22.8 12.6%;  Surgeon: Leandrew Koyanagi, MD;  Location: [facility];  Service: Ophthalmology;  Laterality: Left;  ? CATARACT EXTRACTION W/PHACO Right 05/07/2021  ? Procedure: CATARACT EXTRACTION PHACO AND INTRAOCULAR LENS PLACEMENT (IOC) RIGHT  panoptix 12.85 01:33.8 13.7%;  Surgeon: Leandrew Koyanagi, MD;  Location: [facility];  Service: Ophthalmology;  Laterality: Right;  ? NOSE SURGERY  1967  ? SEPTOPLASTY    ? SHOULDER ARTHROSCOPY WITH OPEN ROTATOR CUFF REPAIR Right 12/05/2015  ? Procedure: SHOULDER ARTHROSCOPY WITH MINI OPEN ROTATOR CUFF REPAIR. DISTAL CLAVICLE EXCISION;  Surgeon: Thornton Park, MD;  Location: [facility];  Service: Orthopedics;  Laterality: Right;  ? ?Social History:  reports that she has never smoked. She has never used smokeless tobacco. She reports that she does not drink alcohol and does not use drugs. ? ?Allergies  ?Allergen Reactions  ? Ciprofloxacin Other (  See Comments)  ?  Patient reports "it doesn't work for me". Last culture was sensitive, however.   ? Zithromax [Azithromycin]   ? Augmentin [Amoxicillin-Pot Clavulanate] Rash  ? ? ?Family History  ?Problem Relation Age of Onset  ? Heart disease Mother   ? Hyperlipidemia Mother   ? Hypertension Mother   ? Cancer Father   ?     lung, prostate  ? Hyperlipidemia Father   ? Hypertension Father    ? Prostate cancer Father   ? Bladder Cancer Neg Hx   ? Kidney cancer Neg Hx   ? ? ?Prior to Admission medications   ?Medication Sig Start Date End Date Taking? Authorizing Provider  ?acetaminophen (TYLENOL) 500 MG tablet Take 500 mg by mouth every 6 (six) hours as needed.    [provider]  ?amLODipine (NORVASC) 5 MG tablet Take 1 tablet (5 mg total) by mouth daily. 02/23/22   Kennyth Arnold, FNP  ?Ascorbic Acid (VITAMIN C) 1000 MG tablet Take 1,000 mg by mouth daily.    [provider]  ?atorvastatin (LIPITOR) 40 MG tablet Take 1 tablet (40 mg total) by mouth daily. 01/08/22   Einar Pheasant, MD  ?cetirizine (ZYRTEC) 10 MG tablet Take 1 tablet (10 mg total) by mouth daily. 12/24/21   Einar Pheasant, MD  ?Cholecalciferol (VITAMIN D3) 25 MCG (1000 UT) CAPS Take 1 capsule by mouth daily.    [provider]  ?CRANBERRY EXTRACT PO Take 1 capsule by mouth 2 (two) times daily.    [provider]  ?D-Mannose 500 MG CAPS Take 500 mg by mouth in the morning and at bedtime.    [provider]  ?doxycycline (VIBRA-TABS) 100 MG tablet Take 1 tablet (100 mg total) by mouth 2 (two) times daily. 03/17/22   Einar Pheasant, MD  ?hydrochlorothiazide (HYDRODIURIL) 12.5 MG tablet TAKE ONE TABLET DAILY. 12/29/21   Einar Pheasant, MD  ?losartan (COZAAR) 100 MG tablet Take 1 tablet by mouth daily. 12/29/21   Einar Pheasant, MD  ?nystatin (MYCOSTATIN) 100000 UNIT/ML suspension 5cc's swish and spit tid 10/30/21   Einar Pheasant, MD  ?polyethylene glycol (MIRALAX / GLYCOLAX) 17 g packet Take 17 g by mouth every other day.    [provider]  ?Probiotic Product (ALIGN PO) Take by mouth as needed.    [provider]  ?triamcinolone (NASACORT) 55 MCG/ACT nasal inhaler Place 2 sprays into the nose as needed. 03/22/13   Einar Pheasant, MD  ?trimethoprim (TRIMPEX) 100 MG tablet Take 1 tablet (100 mg total) by mouth daily. 06/25/21   Nori Riis, PA-C  ? ? ?Physical Exam: ?Vitals:  ?  03/17/22 2042 03/17/22 2056 03/17/22 2100 03/17/22 2132  ?BP:   (!) 114/56   ?Pulse: 81 89 83 83  ?Resp: '15 16 16 14  '$ ?Temp:    99 ?F (37.2 ?C)  ?TempSrc:    Oral  ?SpO2: (!) 89% 96% 97% 96%  ?Height:      ? ?Physical Exam ?Vitals and nursing note reviewed.  ?Constitutional:   ?   General: She is not in acute distress. ?HENT:  ?   Head: Normocephalic and atraumatic.  ?Cardiovascular:  ?   Rate and Rhythm: Normal rate and regular rhythm.  ?   Pulses: Normal pulses.  ?   Heart sounds: Normal heart sounds.  ?Pulmonary:  ?   Effort: Pulmonary effort is normal.  ?   Breath sounds: Normal breath sounds.  ?Abdominal:  ?   Palpations: Abdomen is soft.  ?  Tenderness: There is no abdominal tenderness.  ?Neurological:  ?   Mental Status: Mental status is at baseline.  ? ? ? ?Data Reviewed: ?Relevant notes from primary care and specialist visits, past discharge summaries as available in EHR, including Care Everywhere. ?Prior diagnostic testing as pertinent to current admission diagnoses ?Updated medications and problem lists for reconciliation ?ED course, including vitals, labs, imaging, treatment and response to treatment ?Triage notes, nursing and pharmacy notes and ED provider's notes ?Notable results as noted in HPI ? ? ?Assessment and Plan: ?* Acute respiratory failure with hypoxia (HCC) ?Suspect due to respiratory tract infection given complaints of sinus congestion and chills, elevated WBC however chest x-ray clear ?Differential includes PE given elevated D-dimer of 1.8 ?For possible pneumonia: Treat as outlined on the next problem ?For possible PE: Therapeutic dose Lovenox x1 pending possible evaluation with VQ scan in the a.m. ?Supplemental O2 to keep sats over 94% ? ?Respiratory tract infection ?Patient has hypoxia and tachypnea and upper respiratory symptoms ?CT head without evidence of sinusitis and CT abdomen and pelvis with contrast showing no lower respiratory tract infiltrate ?Empiric treatment with Rocephin  and azithromycin ?Follow procalcitonin ? ?Nausea and vomiting ?CT abdomen and pelvis with contrast without any acute findings.  Cholelithiasis seen ?Antiemetics and IV hydration ? ?Environmental allergi

## 2022-03-18 ENCOUNTER — Other Ambulatory Visit: Payer: Self-pay

## 2022-03-18 ENCOUNTER — Inpatient Hospital Stay: Payer: Medicare Other

## 2022-03-18 ENCOUNTER — Encounter: Payer: Self-pay | Admitting: Internal Medicine

## 2022-03-18 DIAGNOSIS — R7989 Other specified abnormal findings of blood chemistry: Secondary | ICD-10-CM | POA: Diagnosis not present

## 2022-03-18 DIAGNOSIS — I10 Essential (primary) hypertension: Secondary | ICD-10-CM | POA: Diagnosis not present

## 2022-03-18 DIAGNOSIS — J069 Acute upper respiratory infection, unspecified: Secondary | ICD-10-CM

## 2022-03-18 DIAGNOSIS — I2510 Atherosclerotic heart disease of native coronary artery without angina pectoris: Secondary | ICD-10-CM | POA: Diagnosis not present

## 2022-03-18 DIAGNOSIS — I700 Atherosclerosis of aorta: Secondary | ICD-10-CM | POA: Diagnosis not present

## 2022-03-18 DIAGNOSIS — K449 Diaphragmatic hernia without obstruction or gangrene: Secondary | ICD-10-CM | POA: Diagnosis not present

## 2022-03-18 DIAGNOSIS — R0603 Acute respiratory distress: Secondary | ICD-10-CM | POA: Diagnosis not present

## 2022-03-18 DIAGNOSIS — J9601 Acute respiratory failure with hypoxia: Secondary | ICD-10-CM | POA: Diagnosis not present

## 2022-03-18 LAB — RESPIRATORY PANEL BY PCR

## 2022-03-18 MED ORDER — FLUTICASONE PROPIONATE 50 MCG/ACT NA SUSP
1.0000 | Freq: Every day | NASAL | Status: DC
  Administered 2022-03-18: 1 via NASAL
  Filled 2022-03-18: qty 16

## 2022-03-18 MED ORDER — IOHEXOL 350 MG/ML SOLN
75.0000 mL | Freq: Once | INTRAVENOUS | Status: AC | PRN
  Administered 2022-03-18: 75 mL via INTRAVENOUS

## 2022-03-18 NOTE — Discharge Summary (Signed)
Physician Discharge Summary  ?Jamie Elliott Jamie Elliott DOB: 10/01/1946 DOA: 03/17/2022 ? ?PCP: Einar Pheasant, MD ? ?Admit date: 03/17/2022 ?Discharge date: 03/18/2022 ? ?Admitted From: home  ?Disposition:  home  ? ?Recommendations for Outpatient Follow-up:  ?Follow up with PCP in 1 week  ? ?Home Health: no  ?Equipment/Devices: ? ?Discharge Condition: stable  ?CODE STATUS: full  ?Diet recommendation: Heart Healthy ? ?Brief/Interim Summary: ?HPI was taken from Dr. Damita Dunnings: ?Jamie Elliott is a 76 y.o. female with medical history significant for Hypertension, environmental allergies, who presents to the ED with a 1 day history of chills and then nausea and vomiting that started on the day of arrival.  Patient was seen earlier in the day by her PCP and started on Augmentin for possible sinusitis after she complained of chills and congestion.  Following her appointment she developed nausea and vomiting and subsequently presented to the ED.  She denied fevers, abdominal pain, diarrhea and denies chest pain or shortness of breath.  While in the ED she was noted to be intermittently hypoxic to 86 and was placed on O2 at 2 L ?Hospital course: On arrival vitals within normal limits but became tachypneic to 25 with O2 sats dipping to 86 per ED provider with otherwise normal vitals.  Temperature was initially 98.5 going as high as 99.  Blood work significant for WBC of 16,000.  COVID and flu negative.  Troponin 6.  D-dimer was elevated at 1.8.  EKG, personally viewed and interpreted: Sinus at 90 with no acute ST-T wave changes.  CT head showed normal sinuses.  Chest x-ray  unremarkable.  CT abdomen and pelvis with contrast showed no acute abdominopelvic abnormality and no evidence of bowel instruction there was an incidental finding of a nondisplaced left sacral ala fracture.  Also showed cholelithiasis.  The lower chest showed no acute abnormality ?  ?The ED provider, Dr. Cinda Quest did consider CTA chest to evaluate for PE but  due to recent administration of contrast was unable to do so.  He did contact orthopedics regarding the incidental finding of the sacral ala fracture and was advised that patient can follow-up as outpatient.  Patient was started on Levaquin and continued on O2 at 2 L.  Hospitalist consulted for admission  ? ?As per Dr. Jimmye Norman 03/18/22: Pt was able to be weaned off of oxygen as SaOo2 stayed in mid-high 90s on RA. CTA chest was neg for PE as well as pneumonia. COVID19, influenza were both neg. Resp panel was neg at the time of d/c. Of note, pt did not c/o nausea or vomiting on the day of d/c. CT abd/pelvis showed cholelithiasis w/o cholecystitis.  ? ? ?Discharge Diagnoses:  ?Principal Problem: ?  Acute respiratory failure with hypoxia ([place]) ?Active Problems: ?  Respiratory tract infection ?  Environmental allergies ?  Nausea and vomiting ?  HTN (hypertension) ? ? ?Acute respiratory distress: documentation of SaO2 to 89% but not anything lower than that. CXR shows no active cardiopulmonary disease. D-dimer is elevated so get CTA chest ?  ?Possible respiratory tract infection: COVID19, influenza are both neg. CXR shows no active cardiopulmonary disease. Resp panel ordered. Procal 0.11 ?  ?Nausea and vomiting: etiology unclear. CT abd/pelvis shows cholelithiasis w/o cholecystitis. Zofran prn  ?  ?Environmental allergies: continue on home dose of loratadine, flonase ?  ?HTN: continue on amlodipine, losartan  ? ?Discharge Instructions ? ?Discharge Instructions   ? ? Diet - low sodium heart healthy   Complete by: As directed ?  ?  Discharge instructions   Complete by: As directed ?  ? F/u w/ PCP in 1 week  ? Increase activity slowly   Complete by: As directed ?  ? ?  ? ?Allergies as of 03/18/2022   ? ?   Reactions  ? Ciprofloxacin Other (See Comments)  ? Patient reports "it doesn't work for me". Last culture was sensitive, however.   ? Zithromax [azithromycin]   ? Augmentin [amoxicillin-pot Clavulanate] Rash  ? ?  ? ?   ?Medication List  ?  ? ?TAKE these medications   ? ?acetaminophen 500 MG tablet ?Commonly known as: TYLENOL ?Take 500 mg by mouth every 6 (six) hours as needed. ?  ?ALIGN PO ?Take by mouth as needed. ?  ?Alpha-Lipoic Acid 100 MG Caps ?Take 200 mg by mouth 3 (three) times daily. ?  ?amLODipine 5 MG tablet ?Commonly known as: NORVASC ?Take 1 tablet (5 mg total) by mouth daily. ?  ?atorvastatin 40 MG tablet ?Commonly known as: LIPITOR ?Take 1 tablet (40 mg total) by mouth daily. ?  ?cetirizine 10 MG tablet ?Commonly known as: ZYRTEC ?Take 1 tablet (10 mg total) by mouth daily. ?  ?CRANBERRY EXTRACT PO ?Take 1 capsule by mouth 2 (two) times daily. ?  ?D-Mannose 500 MG Caps ?Take 500 mg by mouth in the morning and at bedtime. ?  ?doxycycline 100 MG tablet ?Commonly known as: VIBRA-TABS ?Take 1 tablet (100 mg total) by mouth 2 (two) times daily. ?  ?hydrochlorothiazide 12.5 MG tablet ?Commonly known as: HYDRODIURIL ?TAKE ONE TABLET DAILY. ?  ?losartan 100 MG tablet ?Commonly known as: COZAAR ?Take 1 tablet by mouth daily. ?  ?nystatin 100000 UNIT/ML suspension ?Commonly known as: MYCOSTATIN ?5cc's swish and spit tid ?  ?polyethylene glycol 17 g packet ?Commonly known as: MIRALAX / GLYCOLAX ?Take 17 g by mouth every other day. ?  ?triamcinolone 55 MCG/ACT nasal inhaler ?Commonly known as: NASACORT ?Place 2 sprays into the nose as needed. ?  ?trimethoprim 100 MG tablet ?Commonly known as: TRIMPEX ?Take 1 tablet (100 mg total) by mouth daily. ?  ?vitamin C 1000 MG tablet ?Take 1,000 mg by mouth daily. ?  ?Vitamin D3 25 MCG (1000 UT) Caps ?Take 1 capsule by mouth daily. ?  ? ?  ? ? ?Allergies  ?Allergen Reactions  ? Ciprofloxacin Other (See Comments)  ?  Patient reports "it doesn't work for me". Last culture was sensitive, however.   ? Zithromax [Azithromycin]   ? Augmentin [Amoxicillin-Pot Clavulanate] Rash  ? ? ?Consultations: ? ? ? ?Procedures/Studies: ?DG Chest 2 View ? ?Result Date: 03/17/2022 ?CLINICAL DATA:  Hypoxia  EXAM: CHEST - 2 VIEW COMPARISON:  11/25/2015 FINDINGS: Cardiac shadow is within normal limits. Lungs are well aerated bilaterally. Degenerative changes of the thoracic spine are seen. IMPRESSION: No active cardiopulmonary disease. Electronically Signed   By: Inez Catalina M.D.   On: 03/17/2022 19:57  ? ?CT Head Wo Contrast ? ?Result Date: 03/17/2022 ?CLINICAL DATA:  Mental status change, unknown cause Patient complains of intractable nausea and vomiting with some sinus pain and drainage that and pain in her tailbone are the only complaints. EXAM: CT HEAD WITHOUT CONTRAST TECHNIQUE: Contiguous axial images were obtained from the base of the skull through the vertex without intravenous contrast. RADIATION DOSE REDUCTION: This exam was performed according to the departmental dose-optimization program which includes automated exposure control, adjustment of the mA and/or kV according to patient size and/or use of iterative reconstruction technique. COMPARISON:  None. FINDINGS: Brain: No evidence  of acute intracranial hemorrhage or extra-axial collection.No evidence of mass lesion/concerning mass effect.The ventricles are normal in size.Scattered subcortical and periventricular white matter hypodensities, nonspecific but likely sequela of chronic small vessel ischemic disease. Vascular: No hyperdense vessel or unexpected calcification. Skull: Normal. Negative for fracture or focal lesion. Sinuses/Orbits: No acute finding. Other: None. IMPRESSION: No acute intracranial abnormality. Electronically Signed   By: Maurine Simmering M.D.   On: 03/17/2022 18:21  ? ?CT Angio Chest Pulmonary Embolism (PE) W or WO Contrast ? ?Result Date: 03/18/2022 ?CLINICAL DATA:  PE suspected, positive D-dimer EXAM: CT ANGIOGRAPHY CHEST WITH CONTRAST TECHNIQUE: Multidetector CT imaging of the chest was performed using the standard protocol during bolus administration of intravenous contrast. Multiplanar CT image reconstructions and MIPs were obtained to  evaluate the vascular anatomy. RADIATION DOSE REDUCTION: This exam was performed according to the departmental dose-optimization program which includes automated exposure control, adjustment of the mA and/or k

## 2022-03-18 NOTE — ED Notes (Signed)
RN aware of bed assignment 

## 2022-03-18 NOTE — ED Notes (Signed)
Pt assisted to an upright position to eat her breakfast, spouse is at the bedside, pt denies any other needs at this time ?

## 2022-03-18 NOTE — Progress Notes (Signed)
Admission profile updated. ?

## 2022-03-18 NOTE — Care Management CC44 (Signed)
Condition Code 44 Documentation Completed ? ?Patient Details  ?Name: Jamie Elliott ?MRN: [id number] ?Date of Birth: 05-14-1946 ? ? ?Condition Code 44 given:  Yes ?Patient signature on Condition Code 44 notice:  Yes ?Documentation of 2 MD's agreement:  Yes ?Code 44 added to claim:  Yes ? ? ? ?Shelbie Hutching, RN ?03/18/2022, 3:27 PM ? ?

## 2022-03-18 NOTE — Care Management Obs Status (Signed)
MEDICARE OBSERVATION STATUS NOTIFICATION ? ? ?Patient Details  ?Name: Jamie Elliott ?MRN: [id number] ?Date of Birth: 01-21-1946 ? ? ?Medicare Observation Status Notification Given:  Yes ? ? ? ?Shelbie Hutching, RN ?03/18/2022, 3:27 PM ?

## 2022-03-19 ENCOUNTER — Telehealth: Payer: Self-pay | Admitting: Internal Medicine

## 2022-03-19 NOTE — Telephone Encounter (Signed)
Patient was released from the hospital on 03/18/2022. She needs a hospital follow up, no appointments available. Also she was given a antiebotic and would like to know if she should get it filled. Hospital thought she had a sinus infection. ?

## 2022-03-20 NOTE — Telephone Encounter (Signed)
Patient scheduled for f/u on 4/4 ?

## 2022-03-22 LAB — CULTURE, BLOOD (ROUTINE X 2): Culture: NO GROWTH

## 2022-03-24 ENCOUNTER — Ambulatory Visit (INDEPENDENT_AMBULATORY_CARE_PROVIDER_SITE_OTHER): Payer: Medicare Other | Admitting: Internal Medicine

## 2022-03-24 ENCOUNTER — Encounter: Payer: Self-pay | Admitting: Internal Medicine

## 2022-03-24 DIAGNOSIS — I10 Essential (primary) hypertension: Secondary | ICD-10-CM

## 2022-03-24 DIAGNOSIS — B349 Viral infection, unspecified: Secondary | ICD-10-CM

## 2022-03-24 DIAGNOSIS — K5909 Other constipation: Secondary | ICD-10-CM

## 2022-03-24 DIAGNOSIS — R112 Nausea with vomiting, unspecified: Secondary | ICD-10-CM | POA: Diagnosis not present

## 2022-03-24 DIAGNOSIS — E7800 Pure hypercholesterolemia, unspecified: Secondary | ICD-10-CM | POA: Diagnosis not present

## 2022-03-24 DIAGNOSIS — D219 Benign neoplasm of connective and other soft tissue, unspecified: Secondary | ICD-10-CM

## 2022-03-24 NOTE — Progress Notes (Signed)
Patient ID: Jamie Elliott, female   DOB: March 10, 1946, 76 y.o.   MRN: [id number] ? ? ?Subjective:  ? ? Patient ID: Jamie Elliott, female    DOB: 1946-08-05, 76 y.o.   MRN: [id number] ? ?This visit occurred during the SARS-CoV-2 public health emergency.  Safety protocols were in place, including screening questions prior to the visit, additional usage of staff PPE, and extensive cleaning of exam room while observing appropriate contact time as indicated for disinfecting solutions.  ? ?Patient here for ER follow up.  ? ?Chief Complaint  ?Patient presents with  ? Follow-up  ?  Follow up after ER . Pt reports feeling better. Still has fatigue. Napping through out the day. Also still coughing, able to cough up mucus. Thick and yellow in the AM, but clear and thin the rest of the day.  ? .  ? ?HPI ?Evaluated in ER for nausea, vomiting and chills.  Was seen earlier and felt to have sinus infection.  Was prescribed doxycycline.  Developed increased weakness and vomiting.  To ER.  Was hydrated.  CT head - no acute abnormality.  CT abdomen and pelvis - no acute intraabdominal or pelvic abnormality.  Did note a nondisplaced left sacral alla fracture.  Was given IVFs and zofran.  D dimer elevated.  CT chest - no PE.  Bandlike scarring and or atelectasis of bilateral lung bases.  No acute airspace opacity.  Discharged.  Since her discharge, she does report feeling some better.  Still tired.  No increased sinus pressure.  Still coughing some.  No chest pain or increased sob.  No acid reflux reported.  No abdominal pain.  Some issues with her bowels.  Took miralax.  Had bm today.  No increased pain - sacrum.   ? ? ?Past Medical History:  ?Diagnosis Date  ? Environmental allergies   ? Hypercholesterolemia   ? Hypertension   ? Migraines   ? ?Past Surgical History:  ?Procedure Laterality Date  ? ABDOMINAL HYSTERECTOMY  2000  ? APPENDECTOMY  2000  ? CATARACT EXTRACTION W/PHACO Left 04/23/2021  ? Procedure: CATARACT EXTRACTION PHACO AND  INTRAOCULAR LENS PLACEMENT (IOC) LEFT panoptix toric 10.41 01:22.8 12.6%;  Surgeon: Leandrew Koyanagi, MD;  Location: [place];  Service: Ophthalmology;  Laterality: Left;  ? CATARACT EXTRACTION W/PHACO Right 05/07/2021  ? Procedure: CATARACT EXTRACTION PHACO AND INTRAOCULAR LENS PLACEMENT (IOC) RIGHT  panoptix 12.85 01:33.8 13.7%;  Surgeon: Leandrew Koyanagi, MD;  Location: [place];  Service: Ophthalmology;  Laterality: Right;  ? NOSE SURGERY  1967  ? SEPTOPLASTY    ? SHOULDER ARTHROSCOPY WITH OPEN ROTATOR CUFF REPAIR Right 12/05/2015  ? Procedure: SHOULDER ARTHROSCOPY WITH MINI OPEN ROTATOR CUFF REPAIR. DISTAL CLAVICLE EXCISION;  Surgeon: Thornton Park, MD;  Location: [facility];  Service: Orthopedics;  Laterality: Right;  ? ?Family History  ?Problem Relation Age of Onset  ? Heart disease Mother   ? Hyperlipidemia Mother   ? Hypertension Mother   ? Cancer Father   ?     lung, prostate  ? Hyperlipidemia Father   ? Hypertension Father   ? Prostate cancer Father   ? Bladder Cancer Neg Hx   ? Kidney cancer Neg Hx   ? ?Social History  ? ?Socioeconomic History  ? Marital status: Married  ?  Spouse name: Not on file  ? Number of children: 2  ? Years of education: Not on file  ? Highest education level: Not on file  ?Occupational History  ? Not  on file  ?Tobacco Use  ? Smoking status: Never  ? Smokeless tobacco: Never  ?Vaping Use  ? Vaping Use: Never used  ?Substance and Sexual Activity  ? Alcohol use: No  ?  Alcohol/week: 0.0 standard drinks  ? Drug use: No  ? Sexual activity: Not Currently  ?Other Topics Concern  ? Not on file  ?Social History Narrative  ? Not on file  ? ?Social Determinants of Health  ? ?Financial Resource Strain: Low Risk   ? Difficulty of Paying Living Expenses: Not hard at all  ?Food Insecurity: No Food Insecurity  ? Worried About Charity fundraiser in the Last Year: Never true  ? Ran Out of Food in the Last Year: Never true  ?Transportation Needs: No Transportation  Needs  ? Lack of Transportation (Medical): No  ? Lack of Transportation (Non-Medical): No  ?Physical Activity: Sufficiently Active  ? Days of Exercise per Week: 5 days  ? Minutes of Exercise per Session: 50 min  ?Stress: No Stress Concern Present  ? Feeling of Stress : Not at all  ?Social Connections: Unknown  ? Frequency of Communication with Friends and Family: More than three times a week  ? Frequency of Social Gatherings with Friends and Family: More than three times a week  ? Attends Religious Services: Not on file  ? Active Member of Clubs or Organizations: Not on file  ? Attends Archivist Meetings: Not on file  ? Marital Status: Married  ? ? ? ?Review of Systems  ?Constitutional:  Positive for fatigue. Negative for appetite change and unexpected weight change.  ?HENT:  Positive for congestion. Negative for sinus pressure.   ?Respiratory:  Positive for cough. Negative for chest tightness and shortness of breath.   ?     Minimal cough.   ?Cardiovascular:  Negative for chest pain, palpitations and leg swelling.  ?Gastrointestinal:  Negative for abdominal pain, diarrhea, nausea and vomiting.  ?Genitourinary:  Negative for difficulty urinating and dysuria.  ?Musculoskeletal:  Negative for joint swelling and myalgias.  ?Skin:  Negative for color change and rash.  ?Neurological:  Negative for dizziness, light-headedness and headaches.  ?Psychiatric/Behavioral:  Negative for agitation and dysphoric mood.   ? ?   ?Objective:  ?  ? ?BP 126/70 (BP Location: Left Arm, Patient Position: Sitting, Cuff Size: Small)   Pulse 84   Temp 98 ?F (36.7 ?C) (Oral)   Resp 15   Ht '5\' 2"'$  (1.575 m)   Wt 123 lb 12.8 oz (56.2 kg)   LMP 11/28/1998   SpO2 98%   BMI 22.64 kg/m?  ?Wt Readings from Last 3 Encounters:  ?03/24/22 123 lb 12.8 oz (56.2 kg)  ?03/17/22 124 lb 9.6 oz (56.5 kg)  ?12/11/21 123 lb (55.8 kg)  ? ? ?Physical Exam ?Vitals reviewed.  ?Constitutional:   ?   General: She is not in acute distress. ?    Appearance: Normal appearance.  ?HENT:  ?   Head: Normocephalic and atraumatic.  ?   Right Ear: External ear normal.  ?   Left Ear: External ear normal.  ?Eyes:  ?   General: No scleral icterus.    ?   Right eye: No discharge.     ?   Left eye: No discharge.  ?   Conjunctiva/sclera: Conjunctivae normal.  ?Neck:  ?   Thyroid: No thyromegaly.  ?Cardiovascular:  ?   Rate and Rhythm: Normal rate and regular rhythm.  ?Pulmonary:  ?   Effort: No  respiratory distress.  ?   Breath sounds: Normal breath sounds. No wheezing.  ?Abdominal:  ?   General: Bowel sounds are normal.  ?   Palpations: Abdomen is soft.  ?   Tenderness: There is no abdominal tenderness.  ?Musculoskeletal:     ?   General: No swelling or tenderness.  ?   Cervical back: Neck supple. No tenderness.  ?Lymphadenopathy:  ?   Cervical: No cervical adenopathy.  ?Skin: ?   Findings: No erythema or rash.  ?Neurological:  ?   Mental Status: She is alert.  ?Psychiatric:     ?   Mood and Affect: Mood normal.     ?   Behavior: Behavior normal.  ? ? ? ?Outpatient Encounter Medications as of 03/24/2022  ?Medication Sig  ? acetaminophen (TYLENOL) 500 MG tablet Take 500 mg by mouth every 6 (six) hours as needed.  ? Alpha-Lipoic Acid 100 MG CAPS Take 200 mg by mouth 3 (three) times daily.  ? amLODipine (NORVASC) 5 MG tablet Take 1 tablet (5 mg total) by mouth daily.  ? Ascorbic Acid (VITAMIN C) 1000 MG tablet Take 1,000 mg by mouth daily.  ? atorvastatin (LIPITOR) 40 MG tablet Take 1 tablet (40 mg total) by mouth daily.  ? cetirizine (ZYRTEC) 10 MG tablet Take 1 tablet (10 mg total) by mouth daily.  ? Cholecalciferol (VITAMIN D3) 25 MCG (1000 UT) CAPS Take 1 capsule by mouth daily.  ? CRANBERRY EXTRACT PO Take 1 capsule by mouth 2 (two) times daily.  ? D-Mannose 500 MG CAPS Take 500 mg by mouth in the morning and at bedtime.  ? hydrochlorothiazide (HYDRODIURIL) 12.5 MG tablet TAKE ONE TABLET DAILY.  ? losartan (COZAAR) 100 MG tablet Take 1 tablet by mouth daily.  ? nystatin  (MYCOSTATIN) 100000 UNIT/ML suspension 5cc's swish and spit tid  ? polyethylene glycol (MIRALAX / GLYCOLAX) 17 g packet Take 17 g by mouth every other day.  ? Probiotic Product (ALIGN PO) Take by mouth as needed.  ? triamci

## 2022-03-28 DIAGNOSIS — B349 Viral infection, unspecified: Secondary | ICD-10-CM | POA: Insufficient documentation

## 2022-03-28 NOTE — Assessment & Plan Note (Signed)
Continue amlodipine and hctz.  Follow pressures.  Follow metabolic panel.  

## 2022-03-28 NOTE — Assessment & Plan Note (Signed)
Continue lipitor.  Low cholesterol diet and exercise.  Follow lipid panel and liver function tests.   

## 2022-03-28 NOTE — Assessment & Plan Note (Signed)
Took miralax.  Bowel movement today.  ?

## 2022-03-28 NOTE — Assessment & Plan Note (Signed)
Previous nausea and vomiting.  Resolved.  CT as outlined.  No acute abnormality.  Eating.  Stay hydrated.  Follow.  ?

## 2022-03-28 NOTE — Assessment & Plan Note (Signed)
Saw urology.  Stable.  Recommended f/u ultrasound in 05/2022.   ?

## 2022-03-28 NOTE — Assessment & Plan Note (Signed)
Presume recent symptoms related to viral syndrome.  Eating now.  No nausea or vomiting.  No diarrhea.  Getting better on no abx.  Remain off abx.  Robitussin DM for cough and congestion.  Rest.  Stay hydrated.  Follow closely.  ?

## 2022-04-07 ENCOUNTER — Encounter: Payer: Self-pay | Admitting: Internal Medicine

## 2022-04-07 ENCOUNTER — Ambulatory Visit (INDEPENDENT_AMBULATORY_CARE_PROVIDER_SITE_OTHER): Payer: Medicare Other | Admitting: Internal Medicine

## 2022-04-07 DIAGNOSIS — I10 Essential (primary) hypertension: Secondary | ICD-10-CM

## 2022-04-07 DIAGNOSIS — K120 Recurrent oral aphthae: Secondary | ICD-10-CM

## 2022-04-07 DIAGNOSIS — D72829 Elevated white blood cell count, unspecified: Secondary | ICD-10-CM | POA: Diagnosis not present

## 2022-04-07 DIAGNOSIS — D219 Benign neoplasm of connective and other soft tissue, unspecified: Secondary | ICD-10-CM | POA: Diagnosis not present

## 2022-04-07 DIAGNOSIS — F439 Reaction to severe stress, unspecified: Secondary | ICD-10-CM

## 2022-04-07 DIAGNOSIS — E7800 Pure hypercholesterolemia, unspecified: Secondary | ICD-10-CM

## 2022-04-07 DIAGNOSIS — R5383 Other fatigue: Secondary | ICD-10-CM

## 2022-04-07 LAB — CBC WITH DIFFERENTIAL/PLATELET
Basophils Absolute: 0 10*3/uL (ref 0.0–0.1)
Basophils Relative: 0.6 % (ref 0.0–3.0)
Eosinophils Absolute: 0.2 10*3/uL (ref 0.0–0.7)
Eosinophils Relative: 2.7 % (ref 0.0–5.0)
HCT: 40 % (ref 36.0–46.0)
Hemoglobin: 13.6 g/dL (ref 12.0–15.0)
Lymphocytes Relative: 22.9 % (ref 12.0–46.0)
Lymphs Abs: 1.4 10*3/uL (ref 0.7–4.0)
MCHC: 34.1 g/dL (ref 30.0–36.0)
MCV: 91.7 fl (ref 78.0–100.0)
Monocytes Absolute: 0.3 10*3/uL (ref 0.1–1.0)
Monocytes Relative: 5.6 % (ref 3.0–12.0)
Neutro Abs: 4.1 10*3/uL (ref 1.4–7.7)
Neutrophils Relative %: 68.2 % (ref 43.0–77.0)
Platelets: 227 10*3/uL (ref 150.0–400.0)
RBC: 4.36 Mil/uL (ref 3.87–5.11)
RDW: 13.7 % (ref 11.5–15.5)
WBC: 6 10*3/uL (ref 4.0–10.5)

## 2022-04-07 LAB — BASIC METABOLIC PANEL
BUN: 12 mg/dL (ref 6–23)
CO2: 30 mEq/L (ref 19–32)
Calcium: 10.1 mg/dL (ref 8.4–10.5)
Chloride: 104 mEq/L (ref 96–112)
Creatinine, Ser: 0.68 mg/dL (ref 0.40–1.20)
GFR: 84.73 mL/min (ref >60.00)
Glucose, Bld: 89 mg/dL (ref 70–99)
Potassium: 3.8 mEq/L (ref 3.5–5.1)
Sodium: 140 mEq/L (ref 135–145)

## 2022-04-07 NOTE — Progress Notes (Signed)
Patient ID: Jamie Elliott, female   DOB: [birth year], 76 y.o.   MRN: [id number] ? ? ?Subjective:  ? ? Patient ID: Jamie Elliott, female    DOB: 1946-09-19, 76 y.o.   MRN: [id number] ? ?This visit occurred during the SARS-CoV-2 public health emergency.  Safety protocols were in place, including screening questions prior to the visit, additional usage of staff PPE, and extensive cleaning of exam room while observing appropriate contact time as indicated for disinfecting solutions.  ? ?Patient here for a scheduled follow up.  ?  ? ?HPI ?Here to follow up regarding recent viral syndrome.  Also f/u regarding her blood pressure.  Recently evaluated in ER for nausea, vomiting and chills.  Given IVFs and zofran.  D dimer elevated.  CT chest - no PE.  Since discharge - feels better.  Bowels better.  Eating.  No nausea or vomiting.  Trying to start back walking.  Has noticed - aphthous ulcer.  No chest pain or sob.  No cough or congestion.   ? ? ?Past Medical History:  ?Diagnosis Date  ? Environmental allergies   ? Hypercholesterolemia   ? Hypertension   ? Migraines   ? ?Past Surgical History:  ?Procedure Laterality Date  ? ABDOMINAL HYSTERECTOMY  2000  ? APPENDECTOMY  2000  ? CATARACT EXTRACTION W/PHACO Left 04/23/2021  ? Procedure: CATARACT EXTRACTION PHACO AND INTRAOCULAR LENS PLACEMENT (IOC) LEFT panoptix toric 10.41 01:22.8 12.6%;  Surgeon: Leandrew Koyanagi, MD;  Location: [place];  Service: Ophthalmology;  Laterality: Left;  ? CATARACT EXTRACTION W/PHACO Right 05/07/2021  ? Procedure: CATARACT EXTRACTION PHACO AND INTRAOCULAR LENS PLACEMENT (IOC) RIGHT  panoptix 12.85 01:33.8 13.7%;  Surgeon: Leandrew Koyanagi, MD;  Location: [place];  Service: Ophthalmology;  Laterality: Right;  ? NOSE SURGERY  1967  ? SEPTOPLASTY    ? SHOULDER ARTHROSCOPY WITH OPEN ROTATOR CUFF REPAIR Right 12/05/2015  ? Procedure: SHOULDER ARTHROSCOPY WITH MINI OPEN ROTATOR CUFF REPAIR. DISTAL CLAVICLE EXCISION;  Surgeon: Thornton Park, MD;  Location: [facility];  Service: Orthopedics;  Laterality: Right;  ? ?Family History  ?Problem Relation Age of Onset  ? Heart disease Mother   ? Hyperlipidemia Mother   ? Hypertension Mother   ? Cancer Father   ?     lung, prostate  ? Hyperlipidemia Father   ? Hypertension Father   ? Prostate cancer Father   ? Bladder Cancer Neg Hx   ? Kidney cancer Neg Hx   ? ?Social History  ? ?Socioeconomic History  ? Marital status: Married  ?  Spouse name: Not on file  ? Number of children: 2  ? Years of education: Not on file  ? Highest education level: Not on file  ?Occupational History  ? Not on file  ?Tobacco Use  ? Smoking status: Never  ? Smokeless tobacco: Never  ?Vaping Use  ? Vaping Use: Never used  ?Substance and Sexual Activity  ? Alcohol use: No  ?  Alcohol/week: 0.0 standard drinks  ? Drug use: No  ? Sexual activity: Not Currently  ?Other Topics Concern  ? Not on file  ?Social History Narrative  ? Not on file  ? ?Social Determinants of Health  ? ?Financial Resource Strain: Low Risk   ? Difficulty of Paying Living Expenses: Not hard at all  ?Food Insecurity: No Food Insecurity  ? Worried About Charity fundraiser in the Last Year: Never true  ? Ran Out of Food in the Last Year: Never true  ?  Transportation Needs: No Transportation Needs  ? Lack of Transportation (Medical): No  ? Lack of Transportation (Non-Medical): No  ?Physical Activity: Sufficiently Active  ? Days of Exercise per Week: 5 days  ? Minutes of Exercise per Session: 50 min  ?Stress: No Stress Concern Present  ? Feeling of Stress : Not at all  ?Social Connections: Unknown  ? Frequency of Communication with Friends and Family: More than three times a week  ? Frequency of Social Gatherings with Friends and Family: More than three times a week  ? Attends Religious Services: Not on file  ? Active Member of Clubs or Organizations: Not on file  ? Attends Archivist Meetings: Not on file  ? Marital Status: Married  ? ? ? ?Review of  Systems  ?Constitutional:  Positive for fatigue. Negative for unexpected weight change.  ?HENT:  Negative for congestion and sinus pressure.   ?Respiratory:  Negative for cough, chest tightness and shortness of breath.   ?Cardiovascular:  Negative for chest pain, palpitations and leg swelling.  ?Gastrointestinal:  Negative for abdominal pain, diarrhea, nausea and vomiting.  ?Genitourinary:  Negative for difficulty urinating and dysuria.  ?Musculoskeletal:  Negative for joint swelling and myalgias.  ?Skin:  Negative for color change and rash.  ?     Lesion - third toe left foot.  (Wants to follow).   ?Neurological:  Negative for dizziness, light-headedness and headaches.  ?Psychiatric/Behavioral:  Negative for agitation and dysphoric mood.   ? ?   ?Objective:  ?  ? ?BP (!) 110/58 (BP Location: Left Arm, Patient Position: Sitting, Cuff Size: Normal)   Pulse 79   Temp 98 ?F (36.7 ?C) (Oral)   Ht '5\' 2"'$  (1.575 m)   Wt 123 lb 3.2 oz (55.9 kg)   LMP 11/28/1998   SpO2 97%   BMI 22.53 kg/m?  ?Wt Readings from Last 3 Encounters:  ?04/07/22 123 lb 3.2 oz (55.9 kg)  ?03/24/22 123 lb 12.8 oz (56.2 kg)  ?03/17/22 124 lb 9.6 oz (56.5 kg)  ? ? ?Physical Exam ?Vitals reviewed.  ?Constitutional:   ?   General: She is not in acute distress. ?   Appearance: Normal appearance.  ?HENT:  ?   Head: Normocephalic and atraumatic.  ?   Right Ear: External ear normal.  ?   Left Ear: External ear normal.  ?Eyes:  ?   General: No scleral icterus.    ?   Right eye: No discharge.     ?   Left eye: No discharge.  ?   Conjunctiva/sclera: Conjunctivae normal.  ?Neck:  ?   Thyroid: No thyromegaly.  ?Cardiovascular:  ?   Rate and Rhythm: Normal rate and regular rhythm.  ?Pulmonary:  ?   Effort: No respiratory distress.  ?   Breath sounds: Normal breath sounds. No wheezing.  ?Abdominal:  ?   General: Bowel sounds are normal.  ?   Palpations: Abdomen is soft.  ?   Tenderness: There is no abdominal tenderness.  ?Musculoskeletal:     ?   General: No  swelling or tenderness.  ?   Cervical back: Neck supple. No tenderness.  ?Lymphadenopathy:  ?   Cervical: No cervical adenopathy.  ?Skin: ?   Findings: No erythema or rash.  ?Neurological:  ?   Mental Status: She is alert.  ?Psychiatric:     ?   Mood and Affect: Mood normal.     ?   Behavior: Behavior normal.  ? ? ? ?Outpatient Encounter  Medications as of 04/07/2022  ?Medication Sig  ? acetaminophen (TYLENOL) 500 MG tablet Take 500 mg by mouth every 6 (six) hours as needed.  ? amLODipine (NORVASC) 5 MG tablet Take 1 tablet (5 mg total) by mouth daily.  ? Ascorbic Acid (VITAMIN C) 1000 MG tablet Take 1,000 mg by mouth daily.  ? atorvastatin (LIPITOR) 40 MG tablet Take 1 tablet (40 mg total) by mouth daily.  ? cetirizine (ZYRTEC) 10 MG tablet Take 1 tablet (10 mg total) by mouth daily.  ? Cholecalciferol (VITAMIN D3) 25 MCG (1000 UT) CAPS Take 1 capsule by mouth daily.  ? CRANBERRY EXTRACT PO Take 1 capsule by mouth 2 (two) times daily.  ? D-Mannose 500 MG CAPS Take 500 mg by mouth in the morning and at bedtime.  ? hydrochlorothiazide (HYDRODIURIL) 12.5 MG tablet TAKE ONE TABLET DAILY.  ? losartan (COZAAR) 100 MG tablet Take 1 tablet by mouth daily.  ? polyethylene glycol (MIRALAX / GLYCOLAX) 17 g packet Take 17 g by mouth every other day.  ? Probiotic Product (ALIGN PO) Take by mouth as needed.  ? triamcinolone (NASACORT) 55 MCG/ACT nasal inhaler Place 2 sprays into the nose as needed.  ? [DISCONTINUED] Alpha-Lipoic Acid 100 MG CAPS Take 200 mg by mouth 3 (three) times daily. (Patient not taking: Reported on 04/07/2022)  ? [DISCONTINUED] nystatin (MYCOSTATIN) 100000 UNIT/ML suspension 5cc's swish and spit tid (Patient not taking: Reported on 04/07/2022)  ? ?No facility-administered encounter medications on file as of 04/07/2022.  ?  ? ?Lab Results  ?Component Value Date  ? WBC 6.0 04/07/2022  ? HGB 13.6 04/07/2022  ? HCT 40.0 04/07/2022  ? PLT 227.0 04/07/2022  ? GLUCOSE 89 04/07/2022  ? CHOL 162 03/12/2022  ? TRIG 160.0  (H) 03/12/2022  ? HDL 52.70 03/12/2022  ? LDLDIRECT 103.0 05/21/2015  ? [place] 77 03/12/2022  ? ALT 24 03/17/2022  ? AST 25 03/17/2022  ? NA 140 04/07/2022  ? K 3.8 04/07/2022  ? CL 104 04/07/2022  ? CREATINI

## 2022-04-10 ENCOUNTER — Ambulatory Visit: Payer: Medicare Other | Admitting: Podiatry

## 2022-04-11 ENCOUNTER — Encounter: Payer: Self-pay | Admitting: Internal Medicine

## 2022-04-11 DIAGNOSIS — K120 Recurrent oral aphthae: Secondary | ICD-10-CM | POA: Insufficient documentation

## 2022-04-11 NOTE — Assessment & Plan Note (Signed)
Saw urology.  Stable.  Recommended f/u ultrasound in 05/2022.   ?

## 2022-04-11 NOTE — Assessment & Plan Note (Signed)
Increased stress.  Some depression.  No SI.  Getting back into her walking.  Follow closely. Get her back in soon to reassess.   

## 2022-04-11 NOTE — Assessment & Plan Note (Signed)
[facility] mouthwash.  Follow.  ?

## 2022-04-11 NOTE — Assessment & Plan Note (Signed)
Continue amlodipine and hctz.  Follow pressures.  Follow metabolic panel.  

## 2022-04-11 NOTE — Assessment & Plan Note (Signed)
Recent evaluation - viral syndrome.  Feeling better.  Eating.  No nausea, vomiting or diarrhea.  Eating.  Follow.  Starting to get back in a walking routine.  ?

## 2022-04-11 NOTE — Assessment & Plan Note (Signed)
Recheck cbc to confirm white count normalizes.  ?

## 2022-04-11 NOTE — Assessment & Plan Note (Signed)
Continue lipitor.  Low cholesterol diet and exercise.  Follow lipid panel and liver function tests.   

## 2022-04-21 ENCOUNTER — Ambulatory Visit (INDEPENDENT_AMBULATORY_CARE_PROVIDER_SITE_OTHER): Payer: Medicare Other | Admitting: Podiatry

## 2022-04-21 DIAGNOSIS — D0439 Carcinoma in situ of skin of other parts of face: Secondary | ICD-10-CM | POA: Diagnosis not present

## 2022-04-21 DIAGNOSIS — L989 Disorder of the skin and subcutaneous tissue, unspecified: Secondary | ICD-10-CM

## 2022-04-21 DIAGNOSIS — L905 Scar conditions and fibrosis of skin: Secondary | ICD-10-CM | POA: Diagnosis not present

## 2022-04-21 NOTE — Progress Notes (Signed)
? ?  Subjective: ?76 y.o. female presenting to the office today for new complaint regarding issues to the interdigital areas of the left foot.  Patient states that she is beginning to have pain and tenderness to the interdigital areas of the first second and third toes of the left foot.  She wears orthotics daily.  She has been wearing corn callus pads to help alleviate her symptoms. ? ? ?Past Medical History:  ?Diagnosis Date  ? Environmental allergies   ? Hypercholesterolemia   ? Hypertension   ? Migraines   ? ? ? ?Objective:  ?Physical Exam ?General: Alert and oriented x3 in no acute distress ? ?Dermatology: Hyperkeratotic lesion(s) present on the interdigital areas of the first second and third toes secondary to degenerative changes and enlargement of the IPJ's.. Pain on palpation with a central nucleated core noted. Skin is warm, dry and supple bilateral lower extremities. Negative for open lesions or macerations. ? ?Vascular: Palpable pedal pulses bilaterally. No edema or erythema noted. Capillary refill within normal limits. ? ?Neurological: Epicritic and protective threshold grossly intact bilaterally.  ? ?Musculoskeletal Exam: Pain on palpation at the keratotic lesion(s) noted. Range of motion within normal limits bilateral. Muscle strength 5/5 in all groups bilateral. ? ?Assessment: ?1.  Symptomatic interdigital corns for second and third toes left ? ? ?Plan of Care:  ?1. Patient evaluated ?2. Excisional debridement of keratoic lesion(s) using a chisel blade was performed without incident.  ?3. Dressed area with light dressing. ?4.  Silicone toe pads were dispensed for the patient to wear daily  ?5.  Recommend wide fitting shoes that do not constrict the toebox area  ?6.  Patient is to return to the clinic PRN.  ? ?Edrick Kins, DPM ?[place] ? ?Dr. Edrick Kins, DPM  ?  ?[address]                                        ?[place], [place] [address]                ?Office [phone]  ?Fax [phone] ? ? ? ? ?

## 2022-05-02 ENCOUNTER — Encounter: Payer: Self-pay | Admitting: Internal Medicine

## 2022-05-02 DIAGNOSIS — K140 Glossitis: Secondary | ICD-10-CM | POA: Insufficient documentation

## 2022-05-14 ENCOUNTER — Encounter: Payer: Self-pay | Admitting: Internal Medicine

## 2022-05-14 ENCOUNTER — Ambulatory Visit (INDEPENDENT_AMBULATORY_CARE_PROVIDER_SITE_OTHER): Payer: Medicare Other | Admitting: Internal Medicine

## 2022-05-14 DIAGNOSIS — D219 Benign neoplasm of connective and other soft tissue, unspecified: Secondary | ICD-10-CM | POA: Diagnosis not present

## 2022-05-14 DIAGNOSIS — R5383 Other fatigue: Secondary | ICD-10-CM

## 2022-05-14 DIAGNOSIS — Z9109 Other allergy status, other than to drugs and biological substances: Secondary | ICD-10-CM | POA: Diagnosis not present

## 2022-05-14 DIAGNOSIS — E7800 Pure hypercholesterolemia, unspecified: Secondary | ICD-10-CM

## 2022-05-14 DIAGNOSIS — I10 Essential (primary) hypertension: Secondary | ICD-10-CM

## 2022-05-14 DIAGNOSIS — F439 Reaction to severe stress, unspecified: Secondary | ICD-10-CM

## 2022-05-14 NOTE — Progress Notes (Signed)
Patient ID: SHANNELL MIKKELSEN, female   DOB: July 30, 1946, 76 y.o.   MRN: [id number]   Subjective:    Patient ID: ANI DEOLIVEIRA, female    DOB: [birth year], 76 y.o.   MRN: [id number]   Patient here for a scheduled follow up.   Chief Complaint  Patient presents with   Follow-up    5 wk follow up   .   HPI Here to follow up regarding previous viral syndrome.  Was having persistent decreased energy.  Is feeling better now.  Starting back into her walking.  No chest pain or sob reported.  Has not been significantly bothered by allergies.  No increased cough or congestion.  No acid reflux reported.  No abdominal pain.  Some minimal constipation.  States goes daily, just sometimes small amount.  Discussed fiber.  Overall feeling better.    Past Medical History:  Diagnosis Date   Environmental allergies    Hypercholesterolemia    Hypertension    Migraines    Past Surgical History:  Procedure Laterality Date   ABDOMINAL HYSTERECTOMY  2000   APPENDECTOMY  2000   CATARACT EXTRACTION W/PHACO Left 04/23/2021   Procedure: CATARACT EXTRACTION PHACO AND INTRAOCULAR LENS PLACEMENT (IOC) LEFT panoptix toric 10.41 01:22.8 12.6%;  Surgeon: Leandrew Koyanagi, MD;  Location: [facility];  Service: Ophthalmology;  Laterality: Left;   CATARACT EXTRACTION W/PHACO Right 05/07/2021   Procedure: CATARACT EXTRACTION PHACO AND INTRAOCULAR LENS PLACEMENT (IOC) RIGHT  panoptix 12.85 01:33.8 13.7%;  Surgeon: Leandrew Koyanagi, MD;  Location: [facility];  Service: Ophthalmology;  Laterality: Right;   NOSE SURGERY  1967   SEPTOPLASTY     SHOULDER ARTHROSCOPY WITH OPEN ROTATOR CUFF REPAIR Right 12/05/2015   Procedure: SHOULDER ARTHROSCOPY WITH MINI OPEN ROTATOR CUFF REPAIR. DISTAL CLAVICLE EXCISION;  Surgeon: Thornton Park, MD;  Location: [facility];  Service: Orthopedics;  Laterality: Right;   Family History  Problem Relation Age of Onset   Heart disease Mother    Hyperlipidemia Mother     Hypertension Mother    Cancer Father        lung, prostate   Hyperlipidemia Father    Hypertension Father    Prostate cancer Father    Bladder Cancer Neg Hx    Kidney cancer Neg Hx    Social History   Socioeconomic History   Marital status: Married    Spouse name: Not on file   Number of children: 2   Years of education: Not on file   Highest education level: Not on file  Occupational History   Not on file  Tobacco Use   Smoking status: Never   Smokeless tobacco: Never  Vaping Use   Vaping Use: Never used  Substance and Sexual Activity   Alcohol use: No    Alcohol/week: 0.0 standard drinks   Drug use: No   Sexual activity: Not Currently  Other Topics Concern   Not on file  Social History Narrative   Not on file   Social Determinants of Health   Financial Resource Strain: Low Risk    Difficulty of Paying Living Expenses: Not hard at all  Food Insecurity: No Food Insecurity   Worried About Charity fundraiser in the Last Year: Never true   [place] in the Last Year: Never true  Transportation Needs: No Transportation Needs   Lack of Transportation (Medical): No   Lack of Transportation (Non-Medical): No  Physical Activity: Sufficiently Active   Days of Exercise per  Week: 5 days   Minutes of Exercise per Session: 50 min  Stress: No Stress Concern Present   Feeling of Stress : Not at all  Social Connections: Unknown   Frequency of Communication with Friends and Family: More than three times a week   Frequency of Social Gatherings with Friends and Family: More than three times a week   Attends Religious Services: Not on Electrical engineer or Organizations: Not on file   Attends Archivist Meetings: Not on file   Marital Status: Married     Review of Systems  Constitutional:  Negative for appetite change and unexpected weight change.       Energy better.   HENT:  Negative for congestion and sinus pressure.   Respiratory:  Negative  for cough, chest tightness and shortness of breath.   Cardiovascular:  Negative for chest pain, palpitations and leg swelling.  Gastrointestinal:  Negative for abdominal pain, diarrhea, nausea and vomiting.       Minimal constipation.   Genitourinary:  Negative for difficulty urinating and dysuria.  Musculoskeletal:  Negative for joint swelling and myalgias.  Skin:  Negative for color change and rash.  Neurological:  Negative for dizziness, light-headedness and headaches.  Psychiatric/Behavioral:  Negative for agitation and dysphoric mood.       Objective:     BP 108/60 (BP Location: Left Arm, Patient Position: Sitting, Cuff Size: Small)   Pulse 65   Temp (!) 97.5 F (36.4 C) (Temporal)   Resp 19   Ht '5\' 2"'$  (1.575 m)   Wt 122 lb 9.6 oz (55.6 kg)   LMP 11/28/1998   SpO2 97%   BMI 22.42 kg/m  Wt Readings from Last 3 Encounters:  05/14/22 122 lb 9.6 oz (55.6 kg)  04/07/22 123 lb 3.2 oz (55.9 kg)  03/24/22 123 lb 12.8 oz (56.2 kg)    Physical Exam Vitals reviewed.  Constitutional:      General: She is not in acute distress.    Appearance: Normal appearance.  HENT:     Head: Normocephalic and atraumatic.     Right Ear: External ear normal.     Left Ear: External ear normal.  Eyes:     General: No scleral icterus.       Right eye: No discharge.        Left eye: No discharge.     Conjunctiva/sclera: Conjunctivae normal.  Neck:     Thyroid: No thyromegaly.  Cardiovascular:     Rate and Rhythm: Normal rate and regular rhythm.  Pulmonary:     Effort: No respiratory distress.     Breath sounds: Normal breath sounds. No wheezing.  Abdominal:     General: Bowel sounds are normal.     Palpations: Abdomen is soft.     Tenderness: There is no abdominal tenderness.  Musculoskeletal:        General: No swelling or tenderness.     Cervical back: Neck supple. No tenderness.  Lymphadenopathy:     Cervical: No cervical adenopathy.  Skin:    Findings: No erythema or rash.   Neurological:     Mental Status: She is alert.  Psychiatric:        Mood and Affect: Mood normal.        Behavior: Behavior normal.     Outpatient Encounter Medications as of 05/14/2022  Medication Sig   acetaminophen (TYLENOL) 500 MG tablet Take 500 mg by mouth every 6 (six) hours as needed.  Ascorbic Acid (VITAMIN C) 1000 MG tablet Take 1,000 mg by mouth daily.   atorvastatin (LIPITOR) 40 MG tablet Take 1 tablet (40 mg total) by mouth daily.   cetirizine (ZYRTEC) 10 MG tablet Take 1 tablet (10 mg total) by mouth daily.   Cholecalciferol (VITAMIN D3) 25 MCG (1000 UT) CAPS Take 1 capsule by mouth daily.   CRANBERRY EXTRACT PO Take 1 capsule by mouth 2 (two) times daily.   D-Mannose 500 MG CAPS Take 500 mg by mouth in the morning and at bedtime.   hydrochlorothiazide (HYDRODIURIL) 12.5 MG tablet TAKE ONE TABLET DAILY.   losartan (COZAAR) 100 MG tablet Take 1 tablet by mouth daily.   polyethylene glycol (MIRALAX / GLYCOLAX) 17 g packet Take 17 g by mouth every other day.   Probiotic Product (ALIGN PO) Take by mouth as needed.   triamcinolone (NASACORT) 55 MCG/ACT nasal inhaler Place 2 sprays into the nose as needed.   [DISCONTINUED] amLODipine (NORVASC) 5 MG tablet Take 1 tablet (5 mg total) by mouth daily.   No facility-administered encounter medications on file as of 05/14/2022.     Lab Results  Component Value Date   WBC 6.0 04/07/2022   HGB 13.6 04/07/2022   HCT 40.0 04/07/2022   PLT 227.0 04/07/2022   GLUCOSE 89 04/07/2022   CHOL 162 03/12/2022   TRIG 160.0 (H) 03/12/2022   HDL 52.70 03/12/2022   LDLDIRECT 103.0 05/21/2015   LDLCALC 77 03/12/2022   ALT 24 03/17/2022   AST 25 03/17/2022   NA 140 04/07/2022   K 3.8 04/07/2022   CL 104 04/07/2022   CREATININE 0.68 04/07/2022   BUN 12 04/07/2022   CO2 30 04/07/2022   TSH 1.58 03/12/2022   INR 1.0 03/17/2022    DG Chest 2 View  Result Date: 03/17/2022 CLINICAL DATA:  Hypoxia EXAM: CHEST - 2 VIEW COMPARISON:   11/25/2015 FINDINGS: Cardiac shadow is within normal limits. Lungs are well aerated bilaterally. Degenerative changes of the thoracic spine are seen. IMPRESSION: No active cardiopulmonary disease. Electronically Signed   By: Inez Catalina M.D.   On: 03/17/2022 19:57   CT Head Wo Contrast  Result Date: 03/17/2022 CLINICAL DATA:  Mental status change, unknown cause Patient complains of intractable nausea and vomiting with some sinus pain and drainage that and pain in her tailbone are the only complaints. EXAM: CT HEAD WITHOUT CONTRAST TECHNIQUE: Contiguous axial images were obtained from the base of the skull through the vertex without intravenous contrast. RADIATION DOSE REDUCTION: This exam was performed according to the departmental dose-optimization program which includes automated exposure control, adjustment of the mA and/or kV according to patient size and/or use of iterative reconstruction technique. COMPARISON:  None. FINDINGS: Brain: No evidence of acute intracranial hemorrhage or extra-axial collection.No evidence of mass lesion/concerning mass effect.The ventricles are normal in size.Scattered subcortical and periventricular white matter hypodensities, nonspecific but likely sequela of chronic small vessel ischemic disease. Vascular: No hyperdense vessel or unexpected calcification. Skull: Normal. Negative for fracture or focal lesion. Sinuses/Orbits: No acute finding. Other: None. IMPRESSION: No acute intracranial abnormality. Electronically Signed   By: Maurine Simmering M.D.   On: 03/17/2022 18:21   CT Angio Chest Pulmonary Embolism (PE) W or WO Contrast  Result Date: 03/18/2022 CLINICAL DATA:  PE suspected, positive D-dimer EXAM: CT ANGIOGRAPHY CHEST WITH CONTRAST TECHNIQUE: Multidetector CT imaging of the chest was performed using the standard protocol during bolus administration of intravenous contrast. Multiplanar CT image reconstructions and MIPs were obtained to evaluate  the vascular anatomy.  RADIATION DOSE REDUCTION: This exam was performed according to the departmental dose-optimization program which includes automated exposure control, adjustment of the mA and/or kV according to patient size and/or use of iterative reconstruction technique. CONTRAST:  26m OMNIPAQUE IOHEXOL 350 MG/ML SOLN COMPARISON:  None. FINDINGS: Cardiovascular: Satisfactory opacification of the pulmonary arteries to the segmental level. No evidence of pulmonary embolism. Normal heart size. Scattered left coronary artery calcifications. No pericardial effusion. Aortic atherosclerosis. Mediastinum/Nodes: No enlarged mediastinal, hilar, or axillary lymph nodes. Small hiatal hernia. Thyroid gland, trachea, and esophagus demonstrate no significant findings. Lungs/Pleura: Bandlike scarring and or atelectasis of the bilateral lung bases. No pleural effusion or pneumothorax. Upper Abdomen: No acute abnormality. Musculoskeletal: No chest wall abnormality. No acute osseous findings. Review of the MIP images confirms the above findings. IMPRESSION: 1. Negative examination for pulmonary embolism. 2. Bandlike scarring and or atelectasis of the bilateral lung bases. No acute appearing airspace opacity 3. Coronary artery disease. Aortic Atherosclerosis (ICD10-I70.0). Electronically Signed   By: ADelanna AhmadiM.D.   On: 03/18/2022 11:50   CT ABDOMEN PELVIS W CONTRAST  Result Date: 03/17/2022 CLINICAL DATA:  Nausea/vomiting Also complains of very low back pain almost over the tailbone she says EXAM: CT ABDOMEN AND PELVIS WITH CONTRAST TECHNIQUE: Multidetector CT imaging of the abdomen and pelvis was performed using the standard protocol following bolus administration of intravenous contrast. RADIATION DOSE REDUCTION: This exam was performed according to the departmental dose-optimization program which includes automated exposure control, adjustment of the mA and/or kV according to patient size and/or use of iterative reconstruction technique.  CONTRAST:  1030mOMNIPAQUE IOHEXOL 300 MG/ML  SOLN COMPARISON:  None. FINDINGS: Lower chest: No acute abnormality. Hepatobiliary: No focal liver abnormality is seen. Cholelithiasis. Gallbladder is non-dilated and there is no adjacent inflammation. Pancreas: Unremarkable. No pancreatic ductal dilatation or surrounding inflammatory changes. Spleen: Normal in size without focal abnormality. Adrenals/Urinary Tract: Adrenal glands are unremarkable. No hydronephrosis or nephrolithiasis. There are simple appearing bilateral renal cyst. The bladder moderately is distended but otherwise unremarkable. Stomach/Bowel: Small hiatal hernia. The stomach is otherwise within normal limits. There is no evidence of bowel obstruction.The appendix is normal. There is a moderate colonic stool burden. Vascular/Lymphatic: Aortoiliac atherosclerotic calcifications. No AAA. No lymphadenopathy. Reproductive: Prior hysterectomy. Other: No bowel containing hernia. No ascites. No free intraperitoneal gas. Musculoskeletal: There is evidence of a nondisplaced left sacral ala fracture in zone 1 (series 2, image 65, series 5, images 55 and 56). No suspicious lytic or blastic lesions. Multilevel degenerative changes of the spine. Mild bilateral hip osteoarthritis. IMPRESSION: No acute abdominopelvic abnormality. No evidence of bowel obstruction. Normal appendix. Nondisplaced left sacral ala fracture in zone 1. Small hiatal hernia. Cholelithiasis. Electronically Signed   By: JaMaurine Simmering.D.   On: 03/17/2022 18:14       Assessment & Plan:   Problem List Items Addressed This Visit     Angioleiomyoma    Saw urology.  Stable.  Recommended f/u ultrasound in 05/2022.         Environmental allergies    Stable on current regimen.  Follow.        Fatigue    Energy better. Feeling better.  Follow.        HTN (hypertension)    Continue amlodipine and hctz.  Follow pressures.  Follow metabolic panel.         Relevant Orders   Basic  metabolic panel   Hypercholesterolemia    Continue lipitor.  Low cholesterol  diet and exercise.  Follow lipid panel and liver function tests.        Relevant Orders   Hepatic function panel   Lipid panel   Stress    Overall appears to be handling things relatively well.  Follow.           Einar Pheasant, MD

## 2022-05-15 ENCOUNTER — Telehealth: Payer: Self-pay

## 2022-05-15 NOTE — Telephone Encounter (Signed)
Patient states she is calling to verify that the dates she has for her lab and physical appointments in June are correct.  Patient states Dr. Einar Pheasant stated she was supposed to return in July.  I let patient know that it looks like the July appointment was cancelled but I'll put a note in to be sure this is correct.

## 2022-05-19 NOTE — Telephone Encounter (Signed)
If she is agreeable, see if she will keep the lab appt and physical appt in June.

## 2022-05-20 NOTE — Telephone Encounter (Signed)
Pt agrees .

## 2022-05-21 ENCOUNTER — Other Ambulatory Visit: Payer: Self-pay | Admitting: Internal Medicine

## 2022-05-24 ENCOUNTER — Encounter: Payer: Self-pay | Admitting: Internal Medicine

## 2022-05-24 NOTE — Assessment & Plan Note (Signed)
Saw urology.  Stable.  Recommended f/u ultrasound in 05/2022.

## 2022-05-24 NOTE — Assessment & Plan Note (Signed)
Overall appears to be handling things relatively well.  Follow.   

## 2022-05-24 NOTE — Assessment & Plan Note (Signed)
Stable on current regimen.  Follow.   

## 2022-05-24 NOTE — Assessment & Plan Note (Signed)
Energy better. Feeling better.  Follow.

## 2022-05-24 NOTE — Assessment & Plan Note (Signed)
Continue amlodipine and hctz.  Follow pressures.  Follow metabolic panel.  

## 2022-05-24 NOTE — Assessment & Plan Note (Signed)
Continue lipitor.  Low cholesterol diet and exercise.  Follow lipid panel and liver function tests.   

## 2022-06-12 ENCOUNTER — Other Ambulatory Visit (INDEPENDENT_AMBULATORY_CARE_PROVIDER_SITE_OTHER): Payer: Medicare Other

## 2022-06-12 DIAGNOSIS — I10 Essential (primary) hypertension: Secondary | ICD-10-CM | POA: Diagnosis not present

## 2022-06-12 DIAGNOSIS — E7800 Pure hypercholesterolemia, unspecified: Secondary | ICD-10-CM | POA: Diagnosis not present

## 2022-06-12 LAB — LIPID PANEL
Cholesterol: 167 mg/dL (ref 0–200)
HDL: 57.8 mg/dL (ref >39.00)
LDL Cholesterol: 79 mg/dL (ref 0–99)
NonHDL: 108.85
Total CHOL/HDL Ratio: 3
Triglycerides: 149 mg/dL (ref 0.0–149.0)
VLDL: 29.8 mg/dL (ref 0.0–40.0)

## 2022-06-12 LAB — BASIC METABOLIC PANEL
BUN: 18 mg/dL (ref 6–23)
CO2: 32 mEq/L (ref 19–32)
Calcium: 10.3 mg/dL (ref 8.4–10.5)
Chloride: 105 mEq/L (ref 96–112)
Creatinine, Ser: 0.76 mg/dL (ref 0.40–1.20)
GFR: 76.14 mL/min (ref >60.00)
Glucose, Bld: 89 mg/dL (ref 70–99)
Potassium: 3.9 mEq/L (ref 3.5–5.1)
Sodium: 142 mEq/L (ref 135–145)

## 2022-06-12 LAB — HEPATIC FUNCTION PANEL
ALT: 17 U/L (ref 0–35)
AST: 16 U/L (ref 0–37)
Albumin: 3.9 g/dL (ref 3.5–5.2)
Alkaline Phosphatase: 83 U/L (ref 39–117)
Bilirubin, Direct: 0.1 mg/dL (ref 0.0–0.3)
Total Bilirubin: 0.5 mg/dL (ref 0.2–1.2)
Total Protein: 6 g/dL (ref 6.0–8.3)

## 2022-06-17 ENCOUNTER — Encounter: Payer: Self-pay | Admitting: Internal Medicine

## 2022-06-17 ENCOUNTER — Ambulatory Visit (INDEPENDENT_AMBULATORY_CARE_PROVIDER_SITE_OTHER): Payer: Medicare Other | Admitting: Internal Medicine

## 2022-06-17 VITALS — BP 112/60 | HR 83 | Temp 97.8°F | Resp 21 | Ht 62.0 in | Wt 120.6 lb

## 2022-06-17 DIAGNOSIS — E7800 Pure hypercholesterolemia, unspecified: Secondary | ICD-10-CM

## 2022-06-17 DIAGNOSIS — K1379 Other lesions of oral mucosa: Secondary | ICD-10-CM

## 2022-06-17 DIAGNOSIS — R928 Other abnormal and inconclusive findings on diagnostic imaging of breast: Secondary | ICD-10-CM

## 2022-06-17 DIAGNOSIS — K120 Recurrent oral aphthae: Secondary | ICD-10-CM

## 2022-06-17 DIAGNOSIS — Z0000 Encounter for general adult medical examination without abnormal findings: Secondary | ICD-10-CM

## 2022-06-17 DIAGNOSIS — I10 Essential (primary) hypertension: Secondary | ICD-10-CM

## 2022-06-17 DIAGNOSIS — F439 Reaction to severe stress, unspecified: Secondary | ICD-10-CM

## 2022-06-17 DIAGNOSIS — D219 Benign neoplasm of connective and other soft tissue, unspecified: Secondary | ICD-10-CM | POA: Diagnosis not present

## 2022-06-17 NOTE — Progress Notes (Signed)
Patient ID: Jamie Elliott, female   DOB: [birth year], 76 y.o.   MRN: [id number]   Subjective:    Patient ID: Jamie Elliott, female    DOB: 18-Nov-1946, 76 y.o.   MRN: [id number]   Patient here for her physical exam.   Chief Complaint  Patient presents with   Follow-up    Physical   .   HPI She reports she is feeling better.  More energy.  Stays active.  No chest pain or sob reported.  No abdominal pain or bowel change reported.  Is walking.  Does have a lesion - tongue.  Intermittent.   Past Medical History:  Diagnosis Date   Environmental allergies    Hypercholesterolemia    Hypertension    Migraines    Past Surgical History:  Procedure Laterality Date   ABDOMINAL HYSTERECTOMY  2000   APPENDECTOMY  2000   CATARACT EXTRACTION W/PHACO Left 04/23/2021   Procedure: CATARACT EXTRACTION PHACO AND INTRAOCULAR LENS PLACEMENT (IOC) LEFT panoptix toric 10.41 01:22.8 12.6%;  Surgeon: Leandrew Koyanagi, MD;  Location: [facility];  Service: Ophthalmology;  Laterality: Left;   CATARACT EXTRACTION W/PHACO Right 05/07/2021   Procedure: CATARACT EXTRACTION PHACO AND INTRAOCULAR LENS PLACEMENT (IOC) RIGHT  panoptix 12.85 01:33.8 13.7%;  Surgeon: Leandrew Koyanagi, MD;  Location: [facility];  Service: Ophthalmology;  Laterality: Right;   NOSE SURGERY  1967   SEPTOPLASTY     SHOULDER ARTHROSCOPY WITH OPEN ROTATOR CUFF REPAIR Right 12/05/2015   Procedure: SHOULDER ARTHROSCOPY WITH MINI OPEN ROTATOR CUFF REPAIR. DISTAL CLAVICLE EXCISION;  Surgeon: Thornton Park, MD;  Location: [facility];  Service: Orthopedics;  Laterality: Right;   Family History  Problem Relation Age of Onset   Heart disease Mother    Hyperlipidemia Mother    Hypertension Mother    Cancer Father        lung, prostate   Hyperlipidemia Father    Hypertension Father    Prostate cancer Father    Bladder Cancer Neg Hx    Kidney cancer Neg Hx    Social History   Socioeconomic History   Marital status:  Married    Spouse name: Not on file   Number of children: 2   Years of education: Not on file   Highest education level: Not on file  Occupational History   Not on file  Tobacco Use   Smoking status: Never   Smokeless tobacco: Never  Vaping Use   Vaping Use: Never used  Substance and Sexual Activity   Alcohol use: No    Alcohol/week: 0.0 standard drinks of alcohol   Drug use: No   Sexual activity: Not Currently  Other Topics Concern   Not on file  Social History Narrative   Not on file   Social Determinants of Health   Financial Resource Strain: Low Risk  (12/11/2021)   Overall Financial Resource Strain (CARDIA)    Difficulty of Paying Living Expenses: Not hard at all  Food Insecurity: No Food Insecurity (12/11/2021)   Hunger Vital Sign    Worried About Running Out of Food in the Last Year: Never true    Ran Out of Food in the Last Year: Never true  Transportation Needs: No Transportation Needs (12/11/2021)   PRAPARE - Hydrologist (Medical): No    Lack of Transportation (Non-Medical): No  Physical Activity: Sufficiently Active (12/11/2021)   Exercise Vital Sign    Days of Exercise per Week: 5 days  Minutes of Exercise per Session: 50 min  Stress: No Stress Concern Present (12/11/2021)   [place]    Feeling of Stress : Not at all  Social Connections: Unknown (12/11/2021)   Social Connection and Isolation Panel [NHANES]    Frequency of Communication with Friends and Family: More than three times a week    Frequency of Social Gatherings with Friends and Family: More than three times a week    Attends Religious Services: Not on Advertising copywriter or Organizations: Not on file    Attends Archivist Meetings: Not on file    Marital Status: Married     Review of Systems  Constitutional:  Negative for appetite change and unexpected weight change.   HENT:  Negative for congestion, sinus pressure and sore throat.   Eyes:  Negative for pain and visual disturbance.  Respiratory:  Negative for cough, chest tightness and shortness of breath.   Cardiovascular:  Negative for chest pain, palpitations and leg swelling.  Gastrointestinal:  Negative for abdominal pain, diarrhea, nausea and vomiting.  Genitourinary:  Negative for difficulty urinating and dysuria.  Musculoskeletal:  Negative for joint swelling and myalgias.  Skin:  Negative for color change and rash.  Neurological:  Negative for dizziness, light-headedness and headaches.  Hematological:  Negative for adenopathy. Does not bruise/bleed easily.  Psychiatric/Behavioral:  Negative for agitation and dysphoric mood.        Objective:     BP 112/60 (BP Location: Left Arm, Patient Position: Sitting, Cuff Size: Normal)   Pulse 83   Temp 97.8 F (36.6 C) (Oral)   Resp (!) 21   Ht '5\' 2"'$  (1.575 m)   Wt 120 lb 9.6 oz (54.7 kg)   LMP 11/28/1998   SpO2 95%   BMI 22.06 kg/m  Wt Readings from Last 3 Encounters:  06/17/22 120 lb 9.6 oz (54.7 kg)  05/14/22 122 lb 9.6 oz (55.6 kg)  04/07/22 123 lb 3.2 oz (55.9 kg)    Physical Exam Vitals reviewed.  Constitutional:      General: She is not in acute distress.    Appearance: Normal appearance. She is well-developed.  HENT:     Head: Normocephalic and atraumatic.     Right Ear: External ear normal.     Left Ear: External ear normal.  Eyes:     General: No scleral icterus.       Right eye: No discharge.        Left eye: No discharge.     Conjunctiva/sclera: Conjunctivae normal.  Neck:     Thyroid: No thyromegaly.  Cardiovascular:     Rate and Rhythm: Normal rate and regular rhythm.  Pulmonary:     Effort: No tachypnea, accessory muscle usage or respiratory distress.     Breath sounds: Normal breath sounds. No decreased breath sounds or wheezing.  Chest:  Breasts:    Right: No inverted nipple, mass, nipple discharge or  tenderness (no axillary adenopathy).     Left: No inverted nipple, mass, nipple discharge or tenderness (no axilarry adenopathy).  Abdominal:     General: Bowel sounds are normal.     Palpations: Abdomen is soft.     Tenderness: There is no abdominal tenderness.  Musculoskeletal:        General: No swelling or tenderness.     Cervical back: Neck supple.  Lymphadenopathy:     Cervical: No cervical adenopathy.  Skin:  Findings: No erythema or rash.  Neurological:     Mental Status: She is alert and oriented to person, place, and time.  Psychiatric:        Mood and Affect: Mood normal.        Behavior: Behavior normal.      Outpatient Encounter Medications as of 06/17/2022  Medication Sig   acetaminophen (TYLENOL) 500 MG tablet Take 500 mg by mouth every 6 (six) hours as needed.   amLODipine (NORVASC) 5 MG tablet Take 1 tablet (5 mg total) by mouth daily.   Ascorbic Acid (VITAMIN C) 1000 MG tablet Take 1,000 mg by mouth daily.   atorvastatin (LIPITOR) 40 MG tablet Take 1 tablet (40 mg total) by mouth daily.   cetirizine (ZYRTEC) 10 MG tablet Take 1 tablet (10 mg total) by mouth daily.   Cholecalciferol (VITAMIN D3) 25 MCG (1000 UT) CAPS Take 1 capsule by mouth daily.   CRANBERRY EXTRACT PO Take 1 capsule by mouth 2 (two) times daily.   D-Mannose 500 MG CAPS Take 500 mg by mouth in the morning and at bedtime.   hydrochlorothiazide (HYDRODIURIL) 12.5 MG tablet TAKE ONE TABLET DAILY.   losartan (COZAAR) 100 MG tablet Take 1 tablet by mouth daily.   polyethylene glycol (MIRALAX / GLYCOLAX) 17 g packet Take 17 g by mouth every other day.   Probiotic Product (ALIGN PO) Take by mouth as needed.   triamcinolone (NASACORT) 55 MCG/ACT nasal inhaler Place 2 sprays into the nose as needed.   No facility-administered encounter medications on file as of 06/17/2022.     Lab Results  Component Value Date   WBC 6.0 04/07/2022   HGB 13.6 04/07/2022   HCT 40.0 04/07/2022   PLT 227.0  04/07/2022   GLUCOSE 89 06/12/2022   CHOL 167 06/12/2022   TRIG 149.0 06/12/2022   HDL 57.80 06/12/2022   LDLDIRECT 103.0 05/21/2015   LDLCALC 79 06/12/2022   ALT 17 06/12/2022   AST 16 06/12/2022   NA 142 06/12/2022   K 3.9 06/12/2022   CL 105 06/12/2022   CREATININE 0.76 06/12/2022   BUN 18 06/12/2022   CO2 32 06/12/2022   TSH 1.58 03/12/2022   INR 1.0 03/17/2022    CT Angio Chest Pulmonary Embolism (PE) W or WO Contrast  Result Date: 03/18/2022 CLINICAL DATA:  PE suspected, positive D-dimer EXAM: CT ANGIOGRAPHY CHEST WITH CONTRAST TECHNIQUE: Multidetector CT imaging of the chest was performed using the standard protocol during bolus administration of intravenous contrast. Multiplanar CT image reconstructions and MIPs were obtained to evaluate the vascular anatomy. RADIATION DOSE REDUCTION: This exam was performed according to the departmental dose-optimization program which includes automated exposure control, adjustment of the mA and/or kV according to patient size and/or use of iterative reconstruction technique. CONTRAST:  44m OMNIPAQUE IOHEXOL 350 MG/ML SOLN COMPARISON:  None. FINDINGS: Cardiovascular: Satisfactory opacification of the pulmonary arteries to the segmental level. No evidence of pulmonary embolism. Normal heart size. Scattered left coronary artery calcifications. No pericardial effusion. Aortic atherosclerosis. Mediastinum/Nodes: No enlarged mediastinal, hilar, or axillary lymph nodes. Small hiatal hernia. Thyroid gland, trachea, and esophagus demonstrate no significant findings. Lungs/Pleura: Bandlike scarring and or atelectasis of the bilateral lung bases. No pleural effusion or pneumothorax. Upper Abdomen: No acute abnormality. Musculoskeletal: No chest wall abnormality. No acute osseous findings. Review of the MIP images confirms the above findings. IMPRESSION: 1. Negative examination for pulmonary embolism. 2. Bandlike scarring and or atelectasis of the bilateral lung  bases. No acute appearing airspace  opacity 3. Coronary artery disease. Aortic Atherosclerosis (ICD10-I70.0). Electronically Signed   By: Delanna Ahmadi M.D.   On: 03/18/2022 11:50   DG Chest 2 View  Result Date: 03/17/2022 CLINICAL DATA:  Hypoxia EXAM: CHEST - 2 VIEW COMPARISON:  11/25/2015 FINDINGS: Cardiac shadow is within normal limits. Lungs are well aerated bilaterally. Degenerative changes of the thoracic spine are seen. IMPRESSION: No active cardiopulmonary disease. Electronically Signed   By: Inez Catalina M.D.   On: 03/17/2022 19:57   CT Head Wo Contrast  Result Date: 03/17/2022 CLINICAL DATA:  Mental status change, unknown cause Patient complains of intractable nausea and vomiting with some sinus pain and drainage that and pain in her tailbone are the only complaints. EXAM: CT HEAD WITHOUT CONTRAST TECHNIQUE: Contiguous axial images were obtained from the base of the skull through the vertex without intravenous contrast. RADIATION DOSE REDUCTION: This exam was performed according to the departmental dose-optimization program which includes automated exposure control, adjustment of the mA and/or kV according to patient size and/or use of iterative reconstruction technique. COMPARISON:  None. FINDINGS: Brain: No evidence of acute intracranial hemorrhage or extra-axial collection.No evidence of mass lesion/concerning mass effect.The ventricles are normal in size.Scattered subcortical and periventricular white matter hypodensities, nonspecific but likely sequela of chronic small vessel ischemic disease. Vascular: No hyperdense vessel or unexpected calcification. Skull: Normal. Negative for fracture or focal lesion. Sinuses/Orbits: No acute finding. Other: None. IMPRESSION: No acute intracranial abnormality. Electronically Signed   By: Maurine Simmering M.D.   On: 03/17/2022 18:21   CT ABDOMEN PELVIS W CONTRAST  Result Date: 03/17/2022 CLINICAL DATA:  Nausea/vomiting Also complains of very low back pain  almost over the tailbone she says EXAM: CT ABDOMEN AND PELVIS WITH CONTRAST TECHNIQUE: Multidetector CT imaging of the abdomen and pelvis was performed using the standard protocol following bolus administration of intravenous contrast. RADIATION DOSE REDUCTION: This exam was performed according to the departmental dose-optimization program which includes automated exposure control, adjustment of the mA and/or kV according to patient size and/or use of iterative reconstruction technique. CONTRAST:  113m OMNIPAQUE IOHEXOL 300 MG/ML  SOLN COMPARISON:  None. FINDINGS: Lower chest: No acute abnormality. Hepatobiliary: No focal liver abnormality is seen. Cholelithiasis. Gallbladder is non-dilated and there is no adjacent inflammation. Pancreas: Unremarkable. No pancreatic ductal dilatation or surrounding inflammatory changes. Spleen: Normal in size without focal abnormality. Adrenals/Urinary Tract: Adrenal glands are unremarkable. No hydronephrosis or nephrolithiasis. There are simple appearing bilateral renal cyst. The bladder moderately is distended but otherwise unremarkable. Stomach/Bowel: Small hiatal hernia. The stomach is otherwise within normal limits. There is no evidence of bowel obstruction.The appendix is normal. There is a moderate colonic stool burden. Vascular/Lymphatic: Aortoiliac atherosclerotic calcifications. No AAA. No lymphadenopathy. Reproductive: Prior hysterectomy. Other: No bowel containing hernia. No ascites. No free intraperitoneal gas. Musculoskeletal: There is evidence of a nondisplaced left sacral ala fracture in zone 1 (series 2, image 65, series 5, images 55 and 56). No suspicious lytic or blastic lesions. Multilevel degenerative changes of the spine. Mild bilateral hip osteoarthritis. IMPRESSION: No acute abdominopelvic abnormality. No evidence of bowel obstruction. Normal appendix. Nondisplaced left sacral ala fracture in zone 1. Small hiatal hernia. Cholelithiasis. Electronically Signed    By: JMaurine SimmeringM.D.   On: 03/17/2022 18:14       Assessment & Plan:   Problem List Items Addressed This Visit     Abnormal mammogram    Mammogram 02/23/22 - [facility] I.       Angioleiomyoma  Saw urology.  Stable.  Recommended f/u ultrasound in 05/2022.  Scheduled for 06/25/22.       Aphthous ulcer    Nystatin suspension.  If persistent problem, ENT evaluation.       Health care maintenance    Physical today 06/17/22. Colonoscopy 08/2020.  No repeat colonoscopy.  Mammogram 02/2022 - [facility] I.       HTN (hypertension)    Continue amlodipine and hctz.  Follow pressures.  Follow metabolic panel.        Hypercholesterolemia    Continue lipitor.  Low cholesterol diet and exercise.  Follow lipid panel and liver function tests.       Mouth sores    Nystatin suspension.  If persistent, will require ent evaluation.       Stress    Increased stress.  Some depression.  No SI.  Getting back into her walking.  Follow closely. Get her back in soon to reassess.        Other Visit Diagnoses     Routine general medical examination at a health care facility    -  Primary        Einar Pheasant, MD

## 2022-06-17 NOTE — Assessment & Plan Note (Addendum)
Physical today 06/17/22. Colonoscopy 08/2020.  No repeat colonoscopy.  Mammogram 02/2022 - Bir[facility] I.

## 2022-06-22 ENCOUNTER — Other Ambulatory Visit: Payer: Self-pay | Admitting: Internal Medicine

## 2022-06-23 ENCOUNTER — Encounter: Payer: Self-pay | Admitting: Internal Medicine

## 2022-06-23 NOTE — Assessment & Plan Note (Signed)
Increased stress.  Some depression.  No SI.  Getting back into her walking.  Follow closely. Get her back in soon to reassess.

## 2022-06-23 NOTE — Assessment & Plan Note (Signed)
Continue amlodipine and hctz.  Follow pressures.  Follow metabolic panel.  

## 2022-06-23 NOTE — Assessment & Plan Note (Signed)
Continue lipitor.  Low cholesterol diet and exercise.  Follow lipid panel and liver function tests.   

## 2022-06-23 NOTE — Assessment & Plan Note (Signed)
Mammogram 02/23/22 - [facility] I.

## 2022-06-23 NOTE — Assessment & Plan Note (Signed)
Nystatin suspension.  If persistent problem, ENT evaluation.

## 2022-06-23 NOTE — Assessment & Plan Note (Signed)
Nystatin suspension.  If persistent, will require ent evaluation.

## 2022-06-23 NOTE — Assessment & Plan Note (Signed)
Saw urology.  Stable.  Recommended f/u ultrasound in 05/2022.  Scheduled for 06/25/22.

## 2022-06-24 ENCOUNTER — Ambulatory Visit: Payer: Medicare Other | Admitting: Urology

## 2022-06-25 ENCOUNTER — Ambulatory Visit
Admission: RE | Admit: 2022-06-25 | Discharge: 2022-06-25 | Disposition: A | Discharge status: Home / Self Care | Payer: Medicare Other | Source: Ambulatory Visit | Attending: Urology | Admitting: Urology

## 2022-06-25 DIAGNOSIS — N390 Urinary tract infection, site not specified: Secondary | ICD-10-CM | POA: Insufficient documentation

## 2022-06-25 DIAGNOSIS — N281 Cyst of kidney, acquired: Secondary | ICD-10-CM | POA: Diagnosis not present

## 2022-06-25 DIAGNOSIS — N200 Calculus of kidney: Secondary | ICD-10-CM | POA: Diagnosis not present

## 2022-06-25 NOTE — Progress Notes (Addendum)
06/26/2022 10:24 AM   Jamie Elliott [birth year] [id number]  Referring provider: Einar Pheasant, [address] [place],  [place] [address]  Urological history: 1. rUTI's -Risk factors: age, vaginal atrophy and constipation -KUB 12/2017 constipation.  Cystoscopy performed on 02/02/2018 by Dr. Erlene Quan noted subtle trigonitis but no significant pathology.   -documented positive urine cultures over the last year  + E.coli resistant ampicillin, tetracycline nitrofurantoin 100 mg x 5 day 04/29/2020  + E.coli pan-sensitive Septra DS x 7 days 10/28/2020  + E.coli pan-sensitive Levaquin 750 mg x 5 days 12/18/2020  2. Angiomyolipoma -RUS 11/13/2020 ~ 2 cm cyst in the left kidney and a ~ 1 cm hyperechoic lesion in the right likely an angiomyolipoma -RUS 05/2021 - Stable 0.8 cm hyperechoic structure within the right renal pelvis, which could reflect a nonshadowing calculus or small angiomyolipoma.  Simple left renal cyst. -contrast CT, 02/2022 - simple appearing bilateral renal cyst  3. Vaginal atrophy -cervix, uterus and ovaries removed in 2000.  No malignancies noted -abnormality found in left breast on 02/2021 mammogram/breast ultrasound-currently under surveillance   Chief Complaint  Patient presents with   Other    HPI: Jamie Elliott is a 76 y.o. female who presents today for yearly ultrasound of a right renal angiomyolipoma.  RUS 06/25/2022 simple appearing cysts bilaterally with a 0.5 cm calculus in the mid right kidney.  Also appears stable on the contrast CT in March 2023.  She has no urinary complaints at this time.  Patient denies any modifying or aggravating factors.  Patient denies any gross hematuria, dysuria or suprapubic/flank pain.  Patient denies any fevers, chills, nausea or vomiting.     PMH: Past Medical History:  Diagnosis Date   Environmental allergies    Hypercholesterolemia    Hypertension    Migraines     Surgical History: Past  Surgical History:  Procedure Laterality Date   ABDOMINAL HYSTERECTOMY  2000   APPENDECTOMY  2000   CATARACT EXTRACTION W/PHACO Left 04/23/2021   Procedure: CATARACT EXTRACTION PHACO AND INTRAOCULAR LENS PLACEMENT (IOC) LEFT panoptix toric 10.41 01:22.8 12.6%;  Surgeon: Leandrew Koyanagi, MD;  Location: [facility];  Service: Ophthalmology;  Laterality: Left;   CATARACT EXTRACTION W/PHACO Right 05/07/2021   Procedure: CATARACT EXTRACTION PHACO AND INTRAOCULAR LENS PLACEMENT (IOC) RIGHT  panoptix 12.85 01:33.8 13.7%;  Surgeon: Leandrew Koyanagi, MD;  Location: [facility] [place];  Service: Ophthalmology;  Laterality: Right;   NOSE SURGERY  1967   SEPTOPLASTY     SHOULDER ARTHROSCOPY WITH OPEN ROTATOR CUFF REPAIR Right 12/05/2015   Procedure: SHOULDER ARTHROSCOPY WITH MINI OPEN ROTATOR CUFF REPAIR. DISTAL CLAVICLE EXCISION;  Surgeon: Thornton Park, MD;  Location: [facility];  Service: Orthopedics;  Laterality: Right;    Home Medications:  Current Outpatient Medications on File Prior to Visit  Medication Sig Dispense Refill   acetaminophen (TYLENOL) 500 MG tablet Take 500 mg by mouth every 6 (six) hours as needed.     amLODipine (NORVASC) 5 MG tablet Take 1 tablet (5 mg total) by mouth daily. 90 tablet 0   Ascorbic Acid (VITAMIN C) 1000 MG tablet Take 1,000 mg by mouth daily.     atorvastatin (LIPITOR) 40 MG tablet Take 1 tablet (40 mg total) by mouth daily. 90 tablet 3   cetirizine (ZYRTEC) 10 MG tablet Take 1 tablet (10 mg total) by mouth daily. 30 tablet 0   Cholecalciferol (VITAMIN D3) 25 MCG (1000 UT) CAPS Take 1 capsule by mouth daily.  CRANBERRY EXTRACT PO Take 1 capsule by mouth 2 (two) times daily.     D-Mannose 500 MG CAPS Take 500 mg by mouth in the morning and at bedtime.     hydrochlorothiazide (HYDRODIURIL) 12.5 MG tablet TAKE ONE TABLET DAILY. 90 tablet 2   losartan (COZAAR) 100 MG tablet Take 1 tablet by mouth daily. 90 tablet 2   polyethylene glycol (MIRALAX /  GLYCOLAX) 17 g packet Take 17 g by mouth every other day.     Probiotic Product (ALIGN PO) Take by mouth as needed.     triamcinolone (NASACORT) 55 MCG/ACT nasal inhaler Place 2 sprays into the nose as needed. 1 Inhaler 5   No current facility-administered medications on file prior to visit.    Allergies:  Allergies  Allergen Reactions   Ciprofloxacin Other (See Comments)    Patient reports "it doesn't work for me". Last culture was sensitive, however.    Zithromax [Azithromycin]    Augmentin [Amoxicillin-Pot Clavulanate] Rash    Family History: Family History  Problem Relation Age of Onset   Heart disease Mother    Hyperlipidemia Mother    Hypertension Mother    Cancer Father        lung, prostate   Hyperlipidemia Father    Hypertension Father    Prostate cancer Father    Bladder Cancer Neg Hx    Kidney cancer Neg Hx     Social History:  reports that she has never smoked. She has never used smokeless tobacco. She reports that she does not drink alcohol and does not use drugs.  ROS: Pertinent ROS in HPI  Physical Exam: BP 113/73   Pulse 84   Ht '5\' 2"'$  (1.575 m)   Wt 123 lb (55.8 kg)   LMP 11/28/1998   BMI 22.50 kg/m   Constitutional:  Well nourished. Alert and oriented, No acute distress. HEENT: [place] AT, moist mucus membranes.  Trachea midline Cardiovascular: No clubbing, cyanosis, or edema. Respiratory: Normal respiratory effort, no increased work of breathing. Neurologic: Grossly intact, no focal deficits, moving all 4 extremities. Psychiatric: Normal mood and affect.    Laboratory Data:    Latest Ref Rng & Units 06/12/2022    8:46 AM 04/07/2022   12:12 PM 03/17/2022    4:21 PM  BMP  Glucose 70 - 99 mg/dL 89  89  150   BUN 6 - 23 mg/dL '18  12  18   '$ Creatinine 0.40 - 1.20 mg/dL 0.76  0.68  0.71   Sodium 135 - 145 mEq/L 142  140  137   Potassium 3.5 - 5.1 mEq/L 3.9  3.8  3.6   Chloride 96 - 112 mEq/L 105  104  102   CO2 19 - 32 mEq/L 32  30  23   Calcium 8.4  - 10.5 mg/dL 10.3  10.1  10.0     Pertinent Imaging: I have independently reviewed the films.  See HPI.     Assessment & Plan:    1. rUTI's -no recent UTI's -she will contact us with any UTI symptoms                                2. Vaginal atrophy -discontinue Estrace cream due to abnormal finding on mammogram and breast ultrasound  3. Right renal angiomyolipoma - will obtain a RUS in 12 months (June 2024) to evaluate stability - ending surveillance in 2026  Return in about  1 year (around 06/27/2023) for RUS .  These notes generated with voice recognition software. I apologize for typographical errors.  Zara Council, PA-C  [facility] [address]  [place], [place] [address] [phone]

## 2022-06-26 ENCOUNTER — Encounter: Payer: Self-pay | Admitting: Urology

## 2022-06-26 ENCOUNTER — Ambulatory Visit (INDEPENDENT_AMBULATORY_CARE_PROVIDER_SITE_OTHER): Payer: Medicare Other | Admitting: Urology

## 2022-06-26 VITALS — BP 113/73 | HR 84 | Ht 62.0 in | Wt 123.0 lb

## 2022-06-26 DIAGNOSIS — D1771 Benign lipomatous neoplasm of kidney: Secondary | ICD-10-CM | POA: Diagnosis not present

## 2022-06-26 DIAGNOSIS — N952 Postmenopausal atrophic vaginitis: Secondary | ICD-10-CM

## 2022-06-29 ENCOUNTER — Telehealth: Payer: Self-pay

## 2022-06-29 NOTE — Telephone Encounter (Signed)
Patient notified

## 2022-06-29 NOTE — Telephone Encounter (Signed)
-----   Message from Nori Riis, PA-C sent at 06/26/2022  9:24 PM EDT ----- Please let Jamie Elliott know that her renal ultrasound report states she has benign cysts in the kidneys and what looks like a stone in the right kidney measuring 5 mm that just needs to be monitored.

## 2022-06-30 ENCOUNTER — Ambulatory Visit: Payer: Self-pay | Admitting: Urology

## 2022-07-10 ENCOUNTER — Telehealth: Payer: Self-pay | Admitting: Urology

## 2022-07-10 ENCOUNTER — Other Ambulatory Visit (INDEPENDENT_AMBULATORY_CARE_PROVIDER_SITE_OTHER): Payer: Medicare Other

## 2022-07-10 ENCOUNTER — Other Ambulatory Visit: Payer: Self-pay

## 2022-07-10 ENCOUNTER — Telehealth (INDEPENDENT_AMBULATORY_CARE_PROVIDER_SITE_OTHER): Payer: Medicare Other | Admitting: Internal Medicine

## 2022-07-10 ENCOUNTER — Telehealth: Payer: Self-pay | Admitting: Internal Medicine

## 2022-07-10 VITALS — Ht 62.0 in | Wt 123.0 lb

## 2022-07-10 DIAGNOSIS — R300 Dysuria: Secondary | ICD-10-CM

## 2022-07-10 DIAGNOSIS — D219 Benign neoplasm of connective and other soft tissue, unspecified: Secondary | ICD-10-CM

## 2022-07-10 DIAGNOSIS — I10 Essential (primary) hypertension: Secondary | ICD-10-CM | POA: Diagnosis not present

## 2022-07-10 DIAGNOSIS — N3000 Acute cystitis without hematuria: Secondary | ICD-10-CM

## 2022-07-10 LAB — POCT URINALYSIS DIPSTICK
Bilirubin, UA: NEGATIVE
Glucose, UA: POSITIVE — AB
Ketones, UA: NEGATIVE
Nitrite, UA: NEGATIVE
Protein, UA: POSITIVE — AB
Spec Grav, UA: 1.02 (ref 1.010–1.025)
Urobilinogen, UA: 0.2 E.U./dL
pH, UA: 6 (ref 5.0–8.0)

## 2022-07-10 LAB — URINALYSIS, MICROSCOPIC ONLY

## 2022-07-10 MED ORDER — NITROFURANTOIN MONOHYD MACRO 100 MG PO CAPS: 100.0000 mg | ORAL_CAPSULE | Freq: Two times a day (BID) | ORAL | 0 refills | Status: DC

## 2022-07-10 NOTE — Progress Notes (Unsigned)
Patient ID: Jamie Elliott, female   DOB: [birth year], 76 y.o.   MRN: [id number]   Virtual Visit via video Note  All issues noted in this document were discussed and addressed.  No physical exam was performed (except for noted visual exam findings with Video Visits).   I connected with [name] by a video enabled telemedicine application and verified that I am speaking with the correct person using two identifiers. Location patient: home Location provider: work Persons participating in the virtual visit: patient, provider  The limitations, risks, security and privacy concerns of performing an evaluation and management service by video and the availability of in person appointments have been discussed.  It has also been discussed with the patient that there may be a patient responsible charge related to this service. The patient expressed understanding and agreed to proceed.   Reason for visit: work in appt  HPI: Work in with concerns regarding a possible UTI.  States symptom started 2 days ago.  Increased pressure. Dysuria.  No hematuria.  Some fatigue.  No nausea or vomiting.  No diarrhea.  No fever.  Eating and drinking.  Feels similar to previous UTI.    ROS: See pertinent positives and negatives per HPI.  Past Medical History:  Diagnosis Date   Environmental allergies    Hypercholesterolemia    Hypertension    Migraines     Past Surgical History:  Procedure Laterality Date   ABDOMINAL HYSTERECTOMY  2000   APPENDECTOMY  2000   CATARACT EXTRACTION W/PHACO Left 04/23/2021   Procedure: CATARACT EXTRACTION PHACO AND INTRAOCULAR LENS PLACEMENT (IOC) LEFT panoptix toric 10.41 01:22.8 12.6%;  Surgeon: Leandrew Koyanagi, MD;  Location: [place];  Service: Ophthalmology;  Laterality: Left;   CATARACT EXTRACTION W/PHACO Right 05/07/2021   Procedure: CATARACT EXTRACTION PHACO AND INTRAOCULAR LENS PLACEMENT (IOC) RIGHT  panoptix 12.85 01:33.8 13.7%;  Surgeon: Leandrew Koyanagi, MD;  Location: [place];  Service: Ophthalmology;  Laterality: Right;   NOSE SURGERY  1967   SEPTOPLASTY     SHOULDER ARTHROSCOPY WITH OPEN ROTATOR CUFF REPAIR Right 12/05/2015   Procedure: SHOULDER ARTHROSCOPY WITH MINI OPEN ROTATOR CUFF REPAIR. DISTAL CLAVICLE EXCISION;  Surgeon: Thornton Park, MD;  Location: [facility];  Service: Orthopedics;  Laterality: Right;    Family History  Problem Relation Age of Onset   Heart disease Mother    Hyperlipidemia Mother    Hypertension Mother    Cancer Father        lung, prostate   Hyperlipidemia Father    Hypertension Father    Prostate cancer Father    Bladder Cancer Neg Hx    Kidney cancer Neg Hx     SOCIAL HX: reviewed.    Current Outpatient Medications:    acetaminophen (TYLENOL) 500 MG tablet, Take 500 mg by mouth every 6 (six) hours as needed., Disp: , Rfl:    amLODipine (NORVASC) 5 MG tablet, Take 1 tablet (5 mg total) by mouth daily., Disp: 90 tablet, Rfl: 0   Ascorbic Acid (VITAMIN C) 1000 MG tablet, Take 1,000 mg by mouth daily., Disp: , Rfl:    atorvastatin (LIPITOR) 40 MG tablet, Take 1 tablet (40 mg total) by mouth daily., Disp: 90 tablet, Rfl: 3   cetirizine (ZYRTEC) 10 MG tablet, Take 1 tablet (10 mg total) by mouth daily., Disp: 30 tablet, Rfl: 0   Cholecalciferol (VITAMIN D3) 25 MCG (1000 UT) CAPS, Take 1 capsule by mouth daily., Disp: , Rfl:    CRANBERRY EXTRACT  PO, Take 1 capsule by mouth 2 (two) times daily., Disp: , Rfl:    D-Mannose 500 MG CAPS, Take 500 mg by mouth in the morning and at bedtime., Disp: , Rfl:    hydrochlorothiazide (HYDRODIURIL) 12.5 MG tablet, TAKE ONE TABLET DAILY., Disp: 90 tablet, Rfl: 2   losartan (COZAAR) 100 MG tablet, Take 1 tablet by mouth daily., Disp: 90 tablet, Rfl: 2   nitrofurantoin, macrocrystal-monohydrate, (MACROBID) 100 MG capsule, Take 1 capsule (100 mg total) by mouth 2 (two) times daily., Disp: 10 capsule, Rfl: 0   polyethylene glycol (MIRALAX / GLYCOLAX) 17  g packet, Take 17 g by mouth every other day., Disp: , Rfl:    Probiotic Product (ALIGN PO), Take by mouth as needed., Disp: , Rfl:    triamcinolone (NASACORT) 55 MCG/ACT nasal inhaler, Place 2 sprays into the nose as needed., Disp: 1 Inhaler, Rfl: 5  EXAM:  GENERAL: alert, oriented, appears well and in no acute distress  HEENT: atraumatic, conjunttiva clear, no obvious abnormalities on inspection of external nose and ears  NECK: normal movements of the head and neck  LUNGS: on inspection no signs of respiratory distress, breathing rate appears normal, no obvious gross SOB, gasping or wheezing  CV: no obvious cyanosis  PSYCH/NEURO: pleasant and cooperative, no obvious depression or anxiety, speech and thought processing grossly intact  ASSESSMENT AND PLAN:  Discussed the following assessment and plan:  Problem List Items Addressed This Visit     Angioleiomyoma    Saw urology 06/26/22 - recommended f/u ultrasound in 05/2022.       HTN (hypertension)    Continue amlodipine and hctz.  Follow pressures.  Follow metabolic panel.        UTI (urinary tract infection)    Urinary symptoms and urine dip/micro - appears to be c/w an infection.  Stay hydrated.  Treat with macrobid.  Await urine culture.        Relevant Medications   nitrofurantoin, macrocrystal-monohydrate, (MACROBID) 100 MG capsule   Other Visit Diagnoses     Dysuria    -  Primary   Relevant Orders   POCT Urinalysis Dipstick (Completed)       Return if symptoms worsen or fail to improve.   I discussed the assessment and treatment plan with the patient. The patient was provided an opportunity to ask questions and all were answered. The patient agreed with the plan and demonstrated an understanding of the instructions.   The patient was advised to call back or seek an in-person evaluation if the symptoms worsen or if the condition fails to improve as anticipated.    Einar Pheasant, MD

## 2022-07-10 NOTE — Telephone Encounter (Signed)
Pt called stating she has a UTI and would like to drop off a specimen for her to get antibiotics for the weekend. Pt would like to be called

## 2022-07-10 NOTE — Telephone Encounter (Signed)
Spoke with patient and she is going to go see her PCP today.   Sharyn Lull

## 2022-07-10 NOTE — Telephone Encounter (Signed)
S/w pt - frequency, urgency, pressure. Pt denies fever chills.  Pt agreeable to drop off specimen for ucx, poc urine, urine micro only Agreeable to 430 virtual. Scheduled

## 2022-07-10 NOTE — Telephone Encounter (Signed)
PT LEFT V/M STATING SHE HAS AN UTI AND WOULD LIKE TO BE SEEN TODAY.

## 2022-07-11 ENCOUNTER — Encounter: Payer: Self-pay | Admitting: Internal Medicine

## 2022-07-11 NOTE — Assessment & Plan Note (Signed)
Continue amlodipine and hctz.  Follow pressures.  Follow metabolic panel.  

## 2022-07-11 NOTE — Assessment & Plan Note (Signed)
Saw urology 06/26/22 - recommended f/u ultrasound in 05/2022.

## 2022-07-11 NOTE — Assessment & Plan Note (Signed)
Urinary symptoms and urine dip/micro - appears to be c/w an infection.  Stay hydrated.  Treat with macrobid.  Await urine culture.

## 2022-07-13 LAB — URINE CULTURE
MICRO NUMBER:: 13678823
SPECIMEN QUALITY:: ADEQUATE

## 2022-07-16 ENCOUNTER — Ambulatory Visit: Payer: Medicare Other | Admitting: Internal Medicine

## 2022-07-17 ENCOUNTER — Ambulatory Visit (INDEPENDENT_AMBULATORY_CARE_PROVIDER_SITE_OTHER): Payer: Medicare Other | Admitting: Family Medicine

## 2022-07-17 ENCOUNTER — Encounter: Payer: Self-pay | Admitting: Family Medicine

## 2022-07-17 ENCOUNTER — Telehealth: Payer: Self-pay

## 2022-07-17 DIAGNOSIS — N3000 Acute cystitis without hematuria: Secondary | ICD-10-CM | POA: Diagnosis not present

## 2022-07-17 LAB — POCT URINALYSIS DIPSTICK
Bilirubin, UA: NEGATIVE
Blood, UA: NEGATIVE
Glucose, UA: NEGATIVE
Ketones, UA: NEGATIVE
Nitrite, UA: NEGATIVE
Protein, UA: NEGATIVE
Spec Grav, UA: 1.015 (ref 1.010–1.025)
Urobilinogen, UA: 0.2 E.U./dL
pH, UA: 6 (ref 5.0–8.0)

## 2022-07-17 MED ORDER — SULFAMETHOXAZOLE-TRIMETHOPRIM 800-160 MG PO TABS
1.0000 | ORAL_TABLET | Freq: Two times a day (BID) | ORAL | 0 refills | Status: DC
Start: 2022-07-17 — End: 2023-01-14

## 2022-07-17 NOTE — Progress Notes (Signed)
Tommi Rumps, MD Phone: [phone]  Jamie Elliott is a 76 y.o. female who presents today for same-day visit.  Urinary frequency: Patient was recently treated for UTI.  She felt as though the Macrobid was helping though her symptoms started to get worse again despite continuing on the [place].  She notes a little bit of dysuria today though urinary frequency and urgency have gotten worse.  No hematuria.  She had some lower abdominal soreness.  No vaginal discharge.  Social History   Tobacco Use  Smoking Status Never  Smokeless Tobacco Never    Current Outpatient Medications on File Prior to Visit  Medication Sig Dispense Refill   acetaminophen (TYLENOL) 500 MG tablet Take 500 mg by mouth every 6 (six) hours as needed.     amLODipine (NORVASC) 5 MG tablet Take 1 tablet (5 mg total) by mouth daily. 90 tablet 0   Ascorbic Acid (VITAMIN C) 1000 MG tablet Take 1,000 mg by mouth daily.     atorvastatin (LIPITOR) 40 MG tablet Take 1 tablet (40 mg total) by mouth daily. 90 tablet 3   cetirizine (ZYRTEC) 10 MG tablet Take 1 tablet (10 mg total) by mouth daily. 30 tablet 0   Cholecalciferol (VITAMIN D3) 25 MCG (1000 UT) CAPS Take 1 capsule by mouth daily.     CRANBERRY EXTRACT PO Take 1 capsule by mouth 2 (two) times daily.     D-Mannose 500 MG CAPS Take 500 mg by mouth in the morning and at bedtime.     hydrochlorothiazide (HYDRODIURIL) 12.5 MG tablet TAKE ONE TABLET DAILY. 90 tablet 2   losartan (COZAAR) 100 MG tablet Take 1 tablet by mouth daily. 90 tablet 2   polyethylene glycol (MIRALAX / GLYCOLAX) 17 g packet Take 17 g by mouth every other day.     Probiotic Product (ALIGN PO) Take by mouth as needed.     triamcinolone (NASACORT) 55 MCG/ACT nasal inhaler Place 2 sprays into the nose as needed. 1 Inhaler 5   No current facility-administered medications on file prior to visit.     ROS see history of present illness  Objective  Physical Exam Vitals:   07/17/22 1508  BP:  118/68  Pulse: 83  Temp: 98 F (36.7 C)  SpO2: 98%    BP Readings from Last 3 Encounters:  07/17/22 118/68  06/26/22 113/73  06/17/22 112/60   Wt Readings from Last 3 Encounters:  07/17/22 125 lb 3.2 oz (56.8 kg)  07/10/22 123 lb (55.8 kg)  06/26/22 123 lb (55.8 kg)    Physical Exam Abdominal:     General: Bowel sounds are normal. There is no distension.     Palpations: Abdomen is soft.     Tenderness: There is abdominal tenderness (Slight soreness in the suprapubic region). There is no guarding.      Assessment/Plan: Please see individual problem list.  Problem List Items Addressed This Visit     UTI (urinary tract infection)    Patient has a likely UTI.  We will send her urine for culture and microscopy.  We will treat with Bactrim.  If not improving or if worsening she will let us know.      Relevant Medications   sulfamethoxazole-trimethoprim (BACTRIM DS) 800-160 MG tablet   Other Relevant Orders   POCT Urinalysis Dipstick (Completed)   Urine Microscopic   Urine Culture    Return if symptoms worsen or fail to improve.   Tommi Rumps, MD [place]

## 2022-07-17 NOTE — Addendum Note (Signed)
Addended by: Leeanne Rio on: 07/17/2022 03:30 PM   Modules accepted: Orders

## 2022-07-17 NOTE — Assessment & Plan Note (Signed)
Patient has a likely UTI.  We will send her urine for culture and microscopy.  We will treat with Bactrim.  If not improving or if worsening she will let us know.

## 2022-07-17 NOTE — Telephone Encounter (Signed)
Patient states she believes her UTI has not gone away and would like to know if she needs to give [place] another urine sample or would Dr. Einar Pheasant like to adjust her medication.

## 2022-07-17 NOTE — Patient Instructions (Signed)
Nice to meet you. We will treat your possible UTI with Bactrim.  We will send your urine for culture and microscopy and we will contact you with those results. If your symptoms are worsening at all or not improving please let us know.

## 2022-07-17 NOTE — Telephone Encounter (Signed)
Noted.  Agree with reevaluation if persistent problems.

## 2022-07-19 LAB — URINE CULTURE
MICRO NUMBER:: 13708450
Result:: NO GROWTH
SPECIMEN QUALITY:: ADEQUATE

## 2022-07-19 LAB — URINALYSIS, MICROSCOPIC ONLY
Bacteria, UA: NONE SEEN /HPF
Hyaline Cast: NONE SEEN /LPF
RBC / HPF: NONE SEEN /HPF (ref 0–2)

## 2022-07-27 ENCOUNTER — Other Ambulatory Visit: Payer: Self-pay | Admitting: Internal Medicine

## 2022-08-17 ENCOUNTER — Other Ambulatory Visit: Payer: Self-pay | Admitting: Internal Medicine

## 2022-08-18 ENCOUNTER — Encounter: Payer: Self-pay | Admitting: Internal Medicine

## 2022-08-18 ENCOUNTER — Ambulatory Visit (INDEPENDENT_AMBULATORY_CARE_PROVIDER_SITE_OTHER): Payer: Medicare Other | Admitting: Internal Medicine

## 2022-08-18 DIAGNOSIS — E7800 Pure hypercholesterolemia, unspecified: Secondary | ICD-10-CM

## 2022-08-18 DIAGNOSIS — D219 Benign neoplasm of connective and other soft tissue, unspecified: Secondary | ICD-10-CM | POA: Diagnosis not present

## 2022-08-18 DIAGNOSIS — F439 Reaction to severe stress, unspecified: Secondary | ICD-10-CM | POA: Diagnosis not present

## 2022-08-18 DIAGNOSIS — E559 Vitamin D deficiency, unspecified: Secondary | ICD-10-CM | POA: Diagnosis not present

## 2022-08-18 DIAGNOSIS — W19XXXA Unspecified fall, initial encounter: Secondary | ICD-10-CM

## 2022-08-18 DIAGNOSIS — I10 Essential (primary) hypertension: Secondary | ICD-10-CM

## 2022-08-18 NOTE — Progress Notes (Signed)
Subjective:    Patient ID: Jamie Elliott, female    DOB: 1946/07/28, 76 y.o.   MRN: [id number]   Patient here for  Chief Complaint  Patient presents with   Follow-up   .   HPI Here to follow up regarding her blood pressure and increased stress.  She is doing relatively well.  Stays active.  No chest pain or sob.  No abdominal pain.  Bowels moving.  Did have a fall recently.  Was folding clothes.  Tripped over a box.  Landed on her knee.  Right medial knee - no increased pain now.  No head injury.  Recently treated for UTI.  Symptoms resolved.     Past Medical History:  Diagnosis Date   Environmental allergies    Hypercholesterolemia    Hypertension    Migraines    Past Surgical History:  Procedure Laterality Date   ABDOMINAL HYSTERECTOMY  2000   APPENDECTOMY  2000   CATARACT EXTRACTION W/PHACO Left 04/23/2021   Procedure: CATARACT EXTRACTION PHACO AND INTRAOCULAR LENS PLACEMENT (IOC) LEFT panoptix toric 10.41 01:22.8 12.6%;  Surgeon: Leandrew Koyanagi, MD;  Location: [place];  Service: Ophthalmology;  Laterality: Left;   CATARACT EXTRACTION W/PHACO Right 05/07/2021   Procedure: CATARACT EXTRACTION PHACO AND INTRAOCULAR LENS PLACEMENT (IOC) RIGHT  panoptix 12.85 01:33.8 13.7%;  Surgeon: Leandrew Koyanagi, MD;  Location: [place];  Service: Ophthalmology;  Laterality: Right;   NOSE SURGERY  1967   SEPTOPLASTY     SHOULDER ARTHROSCOPY WITH OPEN ROTATOR CUFF REPAIR Right 12/05/2015   Procedure: SHOULDER ARTHROSCOPY WITH MINI OPEN ROTATOR CUFF REPAIR. DISTAL CLAVICLE EXCISION;  Surgeon: Thornton Park, MD;  Location: [facility];  Service: Orthopedics;  Laterality: Right;   Family History  Problem Relation Age of Onset   Heart disease Mother    Hyperlipidemia Mother    Hypertension Mother    Cancer Father        lung, prostate   Hyperlipidemia Father    Hypertension Father    Prostate cancer Father    Bladder Cancer Neg Hx    Kidney cancer Neg Hx     Social History   Socioeconomic History   Marital status: Married    Spouse name: Not on file   Number of children: 2   Years of education: Not on file   Highest education level: Not on file  Occupational History   Not on file  Tobacco Use   Smoking status: Never   Smokeless tobacco: Never  Vaping Use   Vaping Use: Never used  Substance and Sexual Activity   Alcohol use: No    Alcohol/week: 0.0 standard drinks of alcohol   Drug use: No   Sexual activity: Not Currently  Other Topics Concern   Not on file  Social History Narrative   Not on file   Social Determinants of Health   Financial Resource Strain: Low Risk  (12/11/2021)   Overall Financial Resource Strain (CARDIA)    Difficulty of Paying Living Expenses: Not hard at all  Food Insecurity: No Food Insecurity (12/11/2021)   Hunger Vital Sign    Worried About Running Out of Food in the Last Year: Never true    Ran Out of Food in the Last Year: Never true  Transportation Needs: No Transportation Needs (12/11/2021)   PRAPARE - Hydrologist (Medical): No    Lack of Transportation (Non-Medical): No  Physical Activity: Sufficiently Active (12/11/2021)   Exercise Vital Sign  Days of Exercise per Week: 5 days    Minutes of Exercise per Session: 50 min  Stress: No Stress Concern Present (12/11/2021)   [place]    Feeling of Stress : Not at all  Social Connections: Unknown (12/11/2021)   Social Connection and Isolation Panel [NHANES]    Frequency of Communication with Friends and Family: More than three times a week    Frequency of Social Gatherings with Friends and Family: More than three times a week    Attends Religious Services: Not on Advertising copywriter or Organizations: Not on file    Attends Archivist Meetings: Not on file    Marital Status: Married     Review of Systems  Constitutional:   Negative for appetite change and unexpected weight change.  HENT:  Negative for congestion and sinus pressure.   Respiratory:  Negative for cough, chest tightness and shortness of breath.   Cardiovascular:  Negative for chest pain, palpitations and leg swelling.  Gastrointestinal:  Negative for abdominal pain, diarrhea, nausea and vomiting.  Genitourinary:  Negative for difficulty urinating and dysuria.  Musculoskeletal:  Negative for joint swelling and myalgias.  Skin:  Negative for color change and rash.  Neurological:  Negative for dizziness, light-headedness and headaches.  Psychiatric/Behavioral:  Negative for agitation and dysphoric mood.        Objective:     BP 132/60 (BP Location: Left Arm, Patient Position: Sitting, Cuff Size: Normal)   Pulse 78   Temp 97.7 F (36.5 C) (Oral)   Ht '5\' 2"'$  (1.575 m)   Wt 123 lb (55.8 kg)   LMP 11/28/1998   SpO2 97%   BMI 22.50 kg/m  Wt Readings from Last 3 Encounters:  08/18/22 123 lb (55.8 kg)  07/17/22 125 lb 3.2 oz (56.8 kg)  07/10/22 123 lb (55.8 kg)    Physical Exam Vitals reviewed.  Constitutional:      General: She is not in acute distress.    Appearance: Normal appearance.  HENT:     Head: Normocephalic and atraumatic.     Right Ear: External ear normal.     Left Ear: External ear normal.  Eyes:     General: No scleral icterus.       Right eye: No discharge.        Left eye: No discharge.     Conjunctiva/sclera: Conjunctivae normal.  Neck:     Thyroid: No thyromegaly.  Cardiovascular:     Rate and Rhythm: Normal rate and regular rhythm.  Pulmonary:     Effort: No respiratory distress.     Breath sounds: Normal breath sounds. No wheezing.  Abdominal:     General: Bowel sounds are normal.     Palpations: Abdomen is soft.     Tenderness: There is no abdominal tenderness.  Musculoskeletal:        General: No swelling or tenderness.     Cervical back: Neck supple. No tenderness.  Lymphadenopathy:     Cervical: No  cervical adenopathy.  Skin:    Findings: No erythema or rash.  Neurological:     Mental Status: She is alert.  Psychiatric:        Mood and Affect: Mood normal.        Behavior: Behavior normal.      Outpatient Encounter Medications as of 08/18/2022  Medication Sig   acetaminophen (TYLENOL) 500 MG tablet Take 500 mg by mouth  every 6 (six) hours as needed.   amLODipine (NORVASC) 5 MG tablet Take 1 tablet (5 mg total) by mouth daily.   Ascorbic Acid (VITAMIN C) 1000 MG tablet Take 1,000 mg by mouth daily.   atorvastatin (LIPITOR) 40 MG tablet Take 1 tablet (40 mg total) by mouth daily.   cetirizine (ZYRTEC) 10 MG tablet Take 1 tablet (10 mg total) by mouth daily.   Cholecalciferol (VITAMIN D3) 25 MCG (1000 UT) CAPS Take 1 capsule by mouth daily.   CRANBERRY EXTRACT PO Take 1 capsule by mouth 2 (two) times daily.   D-Mannose 500 MG CAPS Take 500 mg by mouth in the morning and at bedtime.   hydrochlorothiazide (HYDRODIURIL) 12.5 MG tablet TAKE ONE TABLET DAILY.   losartan (COZAAR) 100 MG tablet Take 1 tablet by mouth daily.   polyethylene glycol (MIRALAX / GLYCOLAX) 17 g packet Take 17 g by mouth every other day.   Probiotic Product (ALIGN PO) Take by mouth as needed.   sulfamethoxazole-trimethoprim (BACTRIM DS) 800-160 MG tablet Take 1 tablet by mouth 2 (two) times daily.   triamcinolone (NASACORT) 55 MCG/ACT nasal inhaler Place 2 sprays into the nose as needed.   No facility-administered encounter medications on file as of 08/18/2022.     Lab Results  Component Value Date   WBC 6.0 04/07/2022   HGB 13.6 04/07/2022   HCT 40.0 04/07/2022   PLT 227.0 04/07/2022   GLUCOSE 89 06/12/2022   CHOL 167 06/12/2022   TRIG 149.0 06/12/2022   HDL 57.80 06/12/2022   LDLDIRECT 103.0 05/21/2015   LDLCALC 79 06/12/2022   ALT 17 06/12/2022   AST 16 06/12/2022   NA 142 06/12/2022   K 3.9 06/12/2022   CL 105 06/12/2022   CREATININE 0.76 06/12/2022   BUN 18 06/12/2022   CO2 32 06/12/2022    TSH 1.58 03/12/2022   INR 1.0 03/17/2022    Ultrasound renal complete  Result Date: 06/26/2022 CLINICAL DATA:  Recurrent UTI EXAM: RENAL / URINARY TRACT ULTRASOUND COMPLETE COMPARISON:  Renal ultrasound 06/06/2021 FINDINGS: Right Kidney: Renal measurements: 9.1 x 3.9 x 4.1 cm = volume: 76 mL. Echogenicity within normal limits. No mass or hydronephrosis visualized. There is a shadowing calculus measuring 0.5 cm. Left Kidney: Renal measurements: 9.6 x 4.4 x 4.2 cm = volume: 95 mL. Echogenicity within normal limits. No hydronephrosis. There are simple cysts in the superior pole measuring 2.7 cm and in the midpole measuring 1.2 cm. Bladder: Appears normal for degree of bladder distention. Other: None. IMPRESSION: 1.  Nonobstructing right renal calculus. 2.  Benign cysts in the left kidney. Electronically Signed   By: Audie Pinto M.D.   On: 06/26/2022 12:21       Assessment & Plan:   Problem List Items Addressed This Visit     Angioleiomyoma    Saw urology 06/26/22 - recommended f/u ultrasound in 05/2023.       Fall    Recent fall.  Tripped over a box left in the floor.  Hit her knee.  No increased pain now.  Will notify me if problems.  No head injury.       HTN (hypertension)    Continue amlodipine and hctz.  Follow pressures.  Follow metabolic panel.        Relevant Orders   Basic metabolic panel   Hypercholesterolemia    Continue lipitor.  Low cholesterol diet and exercise.  Follow lipid panel and liver function tests.       Relevant Orders  Hepatic function panel   Lipid panel   Stress    Overall appears to be doing better.  Follow       Vitamin D deficiency    Continue vitamin D supplements.       Relevant Orders   VITAMIN D 25 Hydroxy (Vit-D Deficiency, Fractures)     Einar Pheasant, MD

## 2022-08-23 ENCOUNTER — Encounter: Payer: Self-pay | Admitting: Internal Medicine

## 2022-08-23 DIAGNOSIS — W19XXXA Unspecified fall, initial encounter: Secondary | ICD-10-CM | POA: Insufficient documentation

## 2022-08-23 NOTE — Assessment & Plan Note (Signed)
Overall appears to be doing better.  Follow.  

## 2022-08-23 NOTE — Assessment & Plan Note (Signed)
Recent fall.  Tripped over a box left in the floor.  Hit her knee.  No increased pain now.  Will notify me if problems.  No head injury.

## 2022-08-23 NOTE — Assessment & Plan Note (Signed)
Continue lipitor.  Low cholesterol diet and exercise.  Follow lipid panel and liver function tests.   

## 2022-08-23 NOTE — Assessment & Plan Note (Signed)
Continue vitamin D supplements.  

## 2022-08-23 NOTE — Assessment & Plan Note (Signed)
Continue amlodipine and hctz.  Follow pressures.  Follow metabolic panel.  

## 2022-08-23 NOTE — Assessment & Plan Note (Signed)
Saw urology 06/26/22 - recommended f/u ultrasound in 05/2023.  

## 2022-09-21 ENCOUNTER — Other Ambulatory Visit: Payer: Self-pay | Admitting: Internal Medicine

## 2022-09-24 ENCOUNTER — Other Ambulatory Visit: Payer: Self-pay | Admitting: Internal Medicine

## 2022-09-28 ENCOUNTER — Telehealth: Payer: Self-pay | Admitting: Internal Medicine

## 2022-09-28 NOTE — Telephone Encounter (Signed)
Pt would like to know if the provider recommend the RSV vaccine

## 2022-09-28 NOTE — Telephone Encounter (Signed)
Guidelines for RSV vaccine  - 60 and older or underlying lung issues.

## 2022-10-16 ENCOUNTER — Other Ambulatory Visit (INDEPENDENT_AMBULATORY_CARE_PROVIDER_SITE_OTHER): Payer: Medicare Other

## 2022-10-16 DIAGNOSIS — I10 Essential (primary) hypertension: Secondary | ICD-10-CM | POA: Diagnosis not present

## 2022-10-16 DIAGNOSIS — E7800 Pure hypercholesterolemia, unspecified: Secondary | ICD-10-CM | POA: Diagnosis not present

## 2022-10-16 DIAGNOSIS — E559 Vitamin D deficiency, unspecified: Secondary | ICD-10-CM

## 2022-10-16 LAB — BASIC METABOLIC PANEL
BUN: 18 mg/dL (ref 6–23)
CO2: 29 mEq/L (ref 19–32)
Calcium: 10 mg/dL (ref 8.4–10.5)
Chloride: 104 mEq/L (ref 96–112)
Creatinine, Ser: 0.73 mg/dL (ref 0.40–1.20)
GFR: 79.72 mL/min (ref >60.00)
Glucose, Bld: 80 mg/dL (ref 70–99)
Potassium: 3.9 mEq/L (ref 3.5–5.1)
Sodium: 141 mEq/L (ref 135–145)

## 2022-10-16 LAB — HEPATIC FUNCTION PANEL
ALT: 18 U/L (ref 0–35)
AST: 15 U/L (ref 0–37)
Albumin: 4 g/dL (ref 3.5–5.2)
Alkaline Phosphatase: 85 U/L (ref 39–117)
Bilirubin, Direct: 0.1 mg/dL (ref 0.0–0.3)
Total Bilirubin: 0.6 mg/dL (ref 0.2–1.2)
Total Protein: 6.1 g/dL (ref 6.0–8.3)

## 2022-10-16 LAB — LIPID PANEL
Cholesterol: 168 mg/dL (ref 0–200)
HDL: 58.1 mg/dL (ref >39.00)
LDL Cholesterol: 87 mg/dL (ref 0–99)
NonHDL: 109.49
Total CHOL/HDL Ratio: 3
Triglycerides: 112 mg/dL (ref 0.0–149.0)
VLDL: 22.4 mg/dL (ref 0.0–40.0)

## 2022-10-16 LAB — VITAMIN D 25 HYDROXY (VIT D DEFICIENCY, FRACTURES): VITD: 22.66 ng/mL — ABNORMAL LOW (ref 30.00–100.00)

## 2022-10-21 ENCOUNTER — Ambulatory Visit (INDEPENDENT_AMBULATORY_CARE_PROVIDER_SITE_OTHER): Payer: Medicare Other | Admitting: Internal Medicine

## 2022-10-21 ENCOUNTER — Encounter: Payer: Self-pay | Admitting: Internal Medicine

## 2022-10-21 VITALS — BP 124/70 | HR 74 | Temp 98.6°F | Resp 16 | Ht 62.0 in | Wt 125.4 lb

## 2022-10-21 DIAGNOSIS — E559 Vitamin D deficiency, unspecified: Secondary | ICD-10-CM

## 2022-10-21 DIAGNOSIS — R7989 Other specified abnormal findings of blood chemistry: Secondary | ICD-10-CM | POA: Diagnosis not present

## 2022-10-21 DIAGNOSIS — D219 Benign neoplasm of connective and other soft tissue, unspecified: Secondary | ICD-10-CM | POA: Diagnosis not present

## 2022-10-21 DIAGNOSIS — E7800 Pure hypercholesterolemia, unspecified: Secondary | ICD-10-CM

## 2022-10-21 DIAGNOSIS — I10 Essential (primary) hypertension: Secondary | ICD-10-CM | POA: Diagnosis not present

## 2022-10-21 DIAGNOSIS — W19XXXA Unspecified fall, initial encounter: Secondary | ICD-10-CM

## 2022-10-21 DIAGNOSIS — F439 Reaction to severe stress, unspecified: Secondary | ICD-10-CM

## 2022-10-21 MED ORDER — VITAMIN D (ERGOCALCIFEROL) 1.25 MG (50000 UNIT) PO CAPS: 50000.0000 [IU] | ORAL_CAPSULE | ORAL | 0 refills | Status: DC

## 2022-10-21 NOTE — Patient Instructions (Signed)
Take the prescription vitamin D - once a week.  When you complete the prescription vitamin D, resume your over the counter vitamin D daily.

## 2022-10-21 NOTE — Progress Notes (Signed)
Patient ID: Jamie Elliott, female   DOB: May 29, 1946, 76 y.o.   MRN: [id number]   Subjective:    Patient ID: Jamie Elliott, female    DOB: 11-30-1946, 76 y.o.   MRN: [id number]   Patient here for  Chief Complaint  Patient presents with   Follow-up   Hypertension   .   HPI Follow up regarding her blood pressure and cholesterol. Reports she is doing relatively well.  Did fall 09/07/22.  Was pulling limb.  Husband had the other end and let go. She fell back.  Fell on left side.  Question if head injury.  No residual problems from the fall.  No headache.  No chest pain or sob reported.  No increased cough or congestion.  No abdominal pain or bowels change reported.     Past Medical History:  Diagnosis Date   Environmental allergies    Hypercholesterolemia    Hypertension    Migraines    Past Surgical History:  Procedure Laterality Date   ABDOMINAL HYSTERECTOMY  2000   APPENDECTOMY  2000   CATARACT EXTRACTION W/PHACO Left 04/23/2021   Procedure: CATARACT EXTRACTION PHACO AND INTRAOCULAR LENS PLACEMENT (IOC) LEFT panoptix toric 10.41 01:22.8 12.6%;  Surgeon: Leandrew Koyanagi, MD;  Location: [place];  Service: Ophthalmology;  Laterality: Left;   CATARACT EXTRACTION W/PHACO Right 05/07/2021   Procedure: CATARACT EXTRACTION PHACO AND INTRAOCULAR LENS PLACEMENT (IOC) RIGHT  panoptix 12.85 01:33.8 13.7%;  Surgeon: Leandrew Koyanagi, MD;  Location: [place];  Service: Ophthalmology;  Laterality: Right;   NOSE SURGERY  1967   SEPTOPLASTY     SHOULDER ARTHROSCOPY WITH OPEN ROTATOR CUFF REPAIR Right 12/05/2015   Procedure: SHOULDER ARTHROSCOPY WITH MINI OPEN ROTATOR CUFF REPAIR. DISTAL CLAVICLE EXCISION;  Surgeon: Thornton Park, MD;  Location: [facility];  Service: Orthopedics;  Laterality: Right;   Family History  Problem Relation Age of Onset   Heart disease Mother    Hyperlipidemia Mother    Hypertension Mother    Cancer Father        lung, prostate    Hyperlipidemia Father    Hypertension Father    Prostate cancer Father    Bladder Cancer Neg Hx    Kidney cancer Neg Hx    Social History   Socioeconomic History   Marital status: Married    Spouse name: Not on file   Number of children: 2   Years of education: Not on file   Highest education level: Not on file  Occupational History   Not on file  Tobacco Use   Smoking status: Never   Smokeless tobacco: Never  Vaping Use   Vaping Use: Never used  Substance and Sexual Activity   Alcohol use: No    Alcohol/week: 0.0 standard drinks of alcohol   Drug use: No   Sexual activity: Not Currently  Other Topics Concern   Not on file  Social History Narrative   Not on file   Social Determinants of Health   Financial Resource Strain: Low Risk  (12/11/2021)   Overall Financial Resource Strain (CARDIA)    Difficulty of Paying Living Expenses: Not hard at all  Food Insecurity: No Food Insecurity (12/11/2021)   Hunger Vital Sign    Worried About Running Out of Food in the Last Year: Never true    Ran Out of Food in the Last Year: Never true  Transportation Needs: No Transportation Needs (12/11/2021)   PRAPARE - Hydrologist (Medical):  No    Lack of Transportation (Non-Medical): No  Physical Activity: Sufficiently Active (12/11/2021)   Exercise Vital Sign    Days of Exercise per Week: 5 days    Minutes of Exercise per Session: 50 min  Stress: No Stress Concern Present (12/11/2021)   [place]    Feeling of Stress : Not at all  Social Connections: Unknown (12/11/2021)   Social Connection and Isolation Panel [NHANES]    Frequency of Communication with Friends and Family: More than three times a week    Frequency of Social Gatherings with Friends and Family: More than three times a week    Attends Religious Services: Not on Advertising copywriter or Organizations: Not on file     Attends Archivist Meetings: Not on file    Marital Status: Married     Review of Systems  Constitutional:  Negative for appetite change and unexpected weight change.  HENT:  Negative for congestion and sinus pressure.   Respiratory:  Negative for cough, chest tightness and shortness of breath.   Cardiovascular:  Negative for chest pain, palpitations and leg swelling.  Gastrointestinal:  Negative for abdominal pain, diarrhea, nausea and vomiting.  Genitourinary:  Negative for difficulty urinating and dysuria.  Musculoskeletal:  Negative for joint swelling and myalgias.  Skin:  Negative for color change and rash.  Neurological:  Negative for dizziness and headaches.  Psychiatric/Behavioral:  Negative for agitation and dysphoric mood.        Objective:     BP 124/70 (BP Location: Left Arm, Patient Position: Sitting, Cuff Size: Small)   Pulse 74   Temp 98.6 F (37 C) (Temporal)   Resp 16   Ht '5\' 2"'$  (1.575 m)   Wt 125 lb 6.4 oz (56.9 kg)   LMP 11/28/1998   SpO2 96%   BMI 22.94 kg/m  Wt Readings from Last 3 Encounters:  10/21/22 125 lb 6.4 oz (56.9 kg)  08/18/22 123 lb (55.8 kg)  07/17/22 125 lb 3.2 oz (56.8 kg)    Physical Exam Vitals reviewed.  Constitutional:      General: She is not in acute distress.    Appearance: Normal appearance.  HENT:     Head: Normocephalic and atraumatic.     Right Ear: External ear normal.     Left Ear: External ear normal.  Eyes:     General: No scleral icterus.       Right eye: No discharge.        Left eye: No discharge.     Conjunctiva/sclera: Conjunctivae normal.  Neck:     Thyroid: No thyromegaly.  Cardiovascular:     Rate and Rhythm: Normal rate and regular rhythm.  Pulmonary:     Effort: No respiratory distress.     Breath sounds: Normal breath sounds. No wheezing.  Abdominal:     General: Bowel sounds are normal.     Palpations: Abdomen is soft.     Tenderness: There is no abdominal tenderness.   Musculoskeletal:        General: No swelling or tenderness.     Cervical back: Neck supple. No tenderness.  Lymphadenopathy:     Cervical: No cervical adenopathy.  Skin:    Findings: No erythema or rash.  Neurological:     Mental Status: She is alert.  Psychiatric:        Mood and Affect: Mood normal.  Behavior: Behavior normal.      Outpatient Encounter Medications as of 10/21/2022  Medication Sig   acetaminophen (TYLENOL) 500 MG tablet Take 500 mg by mouth every 6 (six) hours as needed.   amLODipine (NORVASC) 5 MG tablet Take 1 tablet (5 mg total) by mouth daily.   Ascorbic Acid (VITAMIN C) 1000 MG tablet Take 1,000 mg by mouth daily.   atorvastatin (LIPITOR) 40 MG tablet Take 1 tablet (40 mg total) by mouth daily.   cetirizine (ZYRTEC) 10 MG tablet Take 1 tablet (10 mg total) by mouth daily.   Cholecalciferol (VITAMIN D3) 25 MCG (1000 UT) CAPS Take 1 capsule by mouth daily.   CRANBERRY EXTRACT PO Take 1 capsule by mouth 2 (two) times daily.   D-Mannose 500 MG CAPS Take 500 mg by mouth in the morning and at bedtime.   hydrochlorothiazide (HYDRODIURIL) 12.5 MG tablet TAKE ONE TABLET DAILY.   losartan (COZAAR) 100 MG tablet Take 1 tablet by mouth daily.   polyethylene glycol (MIRALAX / GLYCOLAX) 17 g packet Take 17 g by mouth every other day.   Probiotic Product (ALIGN PO) Take by mouth as needed.   sulfamethoxazole-trimethoprim (BACTRIM DS) 800-160 MG tablet Take 1 tablet by mouth 2 (two) times daily.   triamcinolone (NASACORT) 55 MCG/ACT nasal inhaler Place 2 sprays into the nose as needed.   Vitamin D, Ergocalciferol, (DRISDOL) 1.25 MG (50000 UNIT) CAPS capsule Take 1 capsule (50,000 Units total) by mouth every 7 (seven) days.   No facility-administered encounter medications on file as of 10/21/2022.     Lab Results  Component Value Date   WBC 6.0 04/07/2022   HGB 13.6 04/07/2022   HCT 40.0 04/07/2022   PLT 227.0 04/07/2022   GLUCOSE 80 10/16/2022   CHOL 168  10/16/2022   TRIG 112.0 10/16/2022   HDL 58.10 10/16/2022   LDLDIRECT 103.0 05/21/2015   LDLCALC 87 10/16/2022   ALT 18 10/16/2022   AST 15 10/16/2022   NA 141 10/16/2022   K 3.9 10/16/2022   CL 104 10/16/2022   CREATININE 0.73 10/16/2022   BUN 18 10/16/2022   CO2 29 10/16/2022   TSH 1.58 03/12/2022   INR 1.0 03/17/2022    Ultrasound renal complete  Result Date: 06/26/2022 CLINICAL DATA:  Recurrent UTI EXAM: RENAL / URINARY TRACT ULTRASOUND COMPLETE COMPARISON:  Renal ultrasound 06/06/2021 FINDINGS: Right Kidney: Renal measurements: 9.1 x 3.9 x 4.1 cm = volume: 76 mL. Echogenicity within normal limits. No mass or hydronephrosis visualized. There is a shadowing calculus measuring 0.5 cm. Left Kidney: Renal measurements: 9.6 x 4.4 x 4.2 cm = volume: 95 mL. Echogenicity within normal limits. No hydronephrosis. There are simple cysts in the superior pole measuring 2.7 cm and in the midpole measuring 1.2 cm. Bladder: Appears normal for degree of bladder distention. Other: None. IMPRESSION: 1.  Nonobstructing right renal calculus. 2.  Benign cysts in the left kidney. Electronically Signed   By: Audie Pinto M.D.   On: 06/26/2022 12:21       Assessment & Plan:   Problem List Items Addressed This Visit     Abnormal liver function tests    Follow liver function tests.        Angioleiomyoma    Saw urology 06/26/22 - recommended f/u ultrasound in 05/2023.       Fall    Fall as outlined.  No residual problems.  Follow.       HTN (hypertension) - Primary    Continue amlodipine and  hctz.  Follow pressures.  Follow metabolic panel.        Hypercholesterolemia    Continue lipitor.  Low cholesterol diet and exercise.  Follow lipid panel and liver function tests.       Stress    Overall appears to be doing better.  Follow       Vitamin D deficiency    Discussed low vitamin D.  Ergocalciferol q week x 12 weeks and then start otc vitamin D3 as directed.  Follow.          Einar Pheasant, MD

## 2022-10-29 DIAGNOSIS — D2262 Melanocytic nevi of left upper limb, including shoulder: Secondary | ICD-10-CM | POA: Diagnosis not present

## 2022-10-29 DIAGNOSIS — Z85828 Personal history of other malignant neoplasm of skin: Secondary | ICD-10-CM | POA: Diagnosis not present

## 2022-10-29 DIAGNOSIS — D2271 Melanocytic nevi of right lower limb, including hip: Secondary | ICD-10-CM | POA: Diagnosis not present

## 2022-10-29 DIAGNOSIS — D2261 Melanocytic nevi of right upper limb, including shoulder: Secondary | ICD-10-CM | POA: Diagnosis not present

## 2022-11-01 ENCOUNTER — Encounter: Payer: Self-pay | Admitting: Internal Medicine

## 2022-11-01 NOTE — Assessment & Plan Note (Signed)
Fall as outlined.  No residual problems.  Follow.

## 2022-11-01 NOTE — Assessment & Plan Note (Signed)
Saw urology 06/26/22 - recommended f/u ultrasound in 05/2023.

## 2022-11-01 NOTE — Assessment & Plan Note (Signed)
Continue lipitor.  Low cholesterol diet and exercise.  Follow lipid panel and liver function tests.   

## 2022-11-01 NOTE — Assessment & Plan Note (Signed)
Follow liver function tests.   

## 2022-11-01 NOTE — Assessment & Plan Note (Signed)
Discussed low vitamin D.  Ergocalciferol q week x 12 weeks and then start otc vitamin D3 as directed.  Follow.

## 2022-11-01 NOTE — Assessment & Plan Note (Signed)
Overall appears to be doing better.  Follow.  

## 2022-11-01 NOTE — Assessment & Plan Note (Signed)
Continue amlodipine and hctz.  Follow pressures.  Follow metabolic panel.

## 2022-11-13 ENCOUNTER — Other Ambulatory Visit: Payer: Self-pay | Admitting: Internal Medicine

## 2022-12-17 ENCOUNTER — Other Ambulatory Visit: Payer: Self-pay | Admitting: Internal Medicine

## 2022-12-21 ENCOUNTER — Other Ambulatory Visit: Payer: Self-pay | Admitting: Internal Medicine

## 2022-12-22 ENCOUNTER — Other Ambulatory Visit: Payer: Self-pay | Admitting: Internal Medicine

## 2022-12-22 ENCOUNTER — Telehealth: Payer: Self-pay

## 2022-12-22 NOTE — Telephone Encounter (Signed)
Unable to reach patient for scheduled AWV. No answer. Left message. Okay to reschedule.

## 2022-12-24 ENCOUNTER — Other Ambulatory Visit: Payer: Self-pay | Admitting: Internal Medicine

## 2023-01-04 ENCOUNTER — Ambulatory Visit (INDEPENDENT_AMBULATORY_CARE_PROVIDER_SITE_OTHER): Payer: Medicare Other

## 2023-01-04 VITALS — BP 126/61 | HR 74 | Ht 62.0 in | Wt 125.0 lb

## 2023-01-04 DIAGNOSIS — Z0000 Encounter for general adult medical examination without abnormal findings: Secondary | ICD-10-CM | POA: Diagnosis not present

## 2023-01-04 NOTE — Progress Notes (Signed)
Subjective:   Jamie Elliott is a 77 y.o. female who presents for Medicare Annual (Subsequent) preventive examination.  Review of Systems    No ROS.  Medicare Wellness Virtual Visit.  Visual/audio telehealth visit, UTA vital signs.   See social history for additional risk factors.   Cardiac Risk Factors include: advanced age (>55mn, >>69women)     Objective:    Today's Vitals   01/04/23 1027  BP: 126/61  Pulse: 74  Weight: 125 lb (56.7 kg)  Height: '5\' 2"'$  (1.575 m)   Body mass index is 22.86 kg/m.     01/04/2023   10:34 AM 03/18/2022   12:35 PM 03/17/2022    4:19 PM 12/11/2021    9:53 AM 12/10/2020    9:45 AM 12/08/2019    9:47 AM 10/04/2018    9:21 AM  Advanced Directives  Does Patient Have a Medical Advance Directive? Yes  No Yes Yes Yes Yes  Type of AParamedicof [place] will   [place] will [place] will [place]Living will Living will;Healthcare Power of Attorney  Does patient want to make changes to medical advance directive? No - Patient declined   No - Patient declined No - Patient declined No - Patient declined No - Patient declined  Copy of [place] Chart? Yes - validated most recent copy scanned in chart (See row information)   Yes - validated most recent copy scanned in chart (See row information) Yes - validated most recent copy scanned in chart (See row information) Yes - validated most recent copy scanned in chart (See row information) No - copy requested  Would patient like information on creating a medical advance directive?  No - Patient declined         Current Medications (verified) Outpatient Encounter Medications as of 01/04/2023  Medication Sig   acetaminophen (TYLENOL) 500 MG tablet Take 500 mg by mouth every 6 (six) hours as needed.   amLODipine (NORVASC) 5 MG tablet Take 1 tablet (5 mg total) by mouth daily.   Ascorbic  Acid (VITAMIN C) 1000 MG tablet Take 1,000 mg by mouth daily.   atorvastatin (LIPITOR) 40 MG tablet Take 1 tablet (40 mg total) by mouth daily.   cetirizine (ZYRTEC) 10 MG tablet Take 1 tablet (10 mg total) by mouth daily.   Cholecalciferol (VITAMIN D3) 25 MCG (1000 UT) CAPS Take 1 capsule by mouth daily.   CRANBERRY EXTRACT PO Take 1 capsule by mouth 2 (two) times daily.   D-Mannose 500 MG CAPS Take 500 mg by mouth in the morning and at bedtime.   hydrochlorothiazide (HYDRODIURIL) 12.5 MG tablet TAKE ONE TABLET DAILY.   losartan (COZAAR) 100 MG tablet Take 1 tablet by mouth daily.   polyethylene glycol (MIRALAX / GLYCOLAX) 17 g packet Take 17 g by mouth every other day.   Probiotic Product (ALIGN PO) Take by mouth as needed.   sulfamethoxazole-trimethoprim (BACTRIM DS) 800-160 MG tablet Take 1 tablet by mouth 2 (two) times daily.   triamcinolone (NASACORT) 55 MCG/ACT nasal inhaler Place 2 sprays into the nose as needed.   Vitamin D, Ergocalciferol, (DRISDOL) 1.25 MG (50000 UNIT) CAPS capsule Take 1 capsule (50,000 Units total) by mouth every 7 (seven) days.   No facility-administered encounter medications on file as of 01/04/2023.    Allergies (verified) Ciprofloxacin, Zithromax [azithromycin], and Augmentin [amoxicillin-pot clavulanate]   History: Past Medical History:  Diagnosis Date  Environmental allergies    Hypercholesterolemia    Hypertension    Migraines    Past Surgical History:  Procedure Laterality Date   ABDOMINAL HYSTERECTOMY  2000   APPENDECTOMY  2000   CATARACT EXTRACTION W/PHACO Left 04/23/2021   Procedure: CATARACT EXTRACTION PHACO AND INTRAOCULAR LENS PLACEMENT (IOC) LEFT panoptix toric 10.41 01:22.8 12.6%;  Surgeon: Leandrew Koyanagi, MD;  Location: [place];  Service: Ophthalmology;  Laterality: Left;   CATARACT EXTRACTION W/PHACO Right 05/07/2021   Procedure: CATARACT EXTRACTION PHACO AND INTRAOCULAR LENS PLACEMENT (IOC) RIGHT  panoptix 12.85  01:33.8 13.7%;  Surgeon: Leandrew Koyanagi, MD;  Location: [place];  Service: Ophthalmology;  Laterality: Right;   NOSE SURGERY  1967   SEPTOPLASTY     SHOULDER ARTHROSCOPY WITH OPEN ROTATOR CUFF REPAIR Right 12/05/2015   Procedure: SHOULDER ARTHROSCOPY WITH MINI OPEN ROTATOR CUFF REPAIR. DISTAL CLAVICLE EXCISION;  Surgeon: Thornton Park, MD;  Location: [facility];  Service: Orthopedics;  Laterality: Right;   Family History  Problem Relation Age of Onset   Heart disease Mother    Hyperlipidemia Mother    Hypertension Mother    Cancer Father        lung, prostate   Hyperlipidemia Father    Hypertension Father    Prostate cancer Father    Bladder Cancer Neg Hx    Kidney cancer Neg Hx    Social History   Socioeconomic History   Marital status: Married    Spouse name: Not on file   Number of children: 2   Years of education: Not on file   Highest education level: Not on file  Occupational History   Not on file  Tobacco Use   Smoking status: Never   Smokeless tobacco: Never  Vaping Use   Vaping Use: Never used  Substance and Sexual Activity   Alcohol use: No    Alcohol/week: 0.0 standard drinks of alcohol   Drug use: No   Sexual activity: Not Currently  Other Topics Concern   Not on file  Social History Narrative   Not on file   Social Determinants of Health   Financial Resource Strain: Low Risk  (01/04/2023)   Overall Financial Resource Strain (CARDIA)    Difficulty of Paying Living Expenses: Not hard at all  Food Insecurity: No Food Insecurity (01/04/2023)   Hunger Vital Sign    Worried About Running Out of Food in the Last Year: Never true    Ran Out of Food in the Last Year: Never true  Transportation Needs: No Transportation Needs (01/04/2023)   PRAPARE - Hydrologist (Medical): No    Lack of Transportation (Non-Medical): No  Physical Activity: Sufficiently Active (01/04/2023)   Exercise Vital Sign    Days of Exercise  per Week: 5 days    Minutes of Exercise per Session: 40 min  Stress: No Stress Concern Present (01/04/2023)   [place]    Feeling of Stress : Not at all  Social Connections: Unknown (01/04/2023)   Social Connection and Isolation Panel [NHANES]    Frequency of Communication with Friends and Family: More than three times a week    Frequency of Social Gatherings with Friends and Family: More than three times a week    Attends Religious Services: Not on file    Active Member of Clubs or Organizations: Yes    Attends Archivist Meetings: More than 4 times per year  Marital Status: Married    Tobacco Counseling Counseling given: Not Answered   Clinical Intake:  Pre-visit preparation completed: Yes        Diabetes: No  How often do you need to have someone help you when you read instructions, pamphlets, or other written materials from your doctor or pharmacy?: 1 - Never    Interpreter Needed?: No      Activities of Daily Living    01/04/2023   10:29 AM 12/20/2022    7:55 PM  In your present state of health, do you have any difficulty performing the following activities:  Hearing? 0 0  Vision? 0 0  Difficulty concentrating or making decisions? 0 0  Walking or climbing stairs? 0 0  Dressing or bathing? 0 0  Doing errands, shopping? 0 0  Preparing Food and eating ? N N  Using the Toilet? N N  In the past six months, have you accidently leaked urine? N N  Do you have problems with loss of bowel control? N N  Managing your Medications? N N  Managing your Finances? N N  Housekeeping or managing your Housekeeping? N N    Patient Care Team: Einar Pheasant, MD as PCP - General (Internal Medicine)  Indicate any recent Medical Services you may have received from other than [facility] providers in the past year (date may be approximate).     Assessment:   This is a routine wellness examination for  [facility].  I connected with  LEYTON BROWNLEE on 01/04/23 by a audio enabled telemedicine application and verified that I am speaking with the correct person using two identifiers.  Patient Location: Home  Provider Location: Office/Clinic  I discussed the limitations of evaluation and management by telemedicine. The patient expressed understanding and agreed to proceed.   Hearing/Vision screen Hearing Screening - Comments:: Patient is able to hear conversational tones without difficulty.  No issues reported.   Vision Screening - Comments:: Followed by [facility] Wears corrective lenses Cataract extracted, bilateral They have regular follow up with the ophthalmologist    Dietary issues and exercise activities discussed: Current Exercise Habits: Home exercise routine, Type of exercise: walking, Time (Minutes): 45, Frequency (Times/Week): 5, Weekly Exercise (Minutes/Week): 225, Intensity: Mild   Goals Addressed             This Visit's Progress    Maintain Healthy Lifestyle       Keep all routine scheduled appointments Stay active Lose about 5lb       Depression Screen    01/04/2023   10:29 AM 10/21/2022    9:34 AM 07/17/2022    3:11 PM 06/17/2022    9:36 AM 05/14/2022    7:07 AM 03/24/2022    4:04 PM 03/17/2022   10:03 AM  PHQ 2/9 Scores  PHQ - 2 Score 0 0 0 0 0 0 0    Fall Risk    01/04/2023   10:29 AM 12/20/2022    7:55 PM 10/21/2022    9:34 AM 07/17/2022    3:10 PM 06/17/2022    9:36 AM  [place] in the past year? '1 1 1 '$ 0 0  Comment None since last reported to pcp      Number falls in past yr:  0 1 0   Injury with Fall?  0 1 0   Risk for fall due to :   History of fall(s);Impaired balance/gait No Fall Risks No Fall Risks  Follow up Falls evaluation  completed;Falls prevention discussed  Falls evaluation completed Falls evaluation completed Falls evaluation completed    FALL RISK PREVENTION PERTAINING TO THE HOME: Home free of loose throw rugs in  walkways, pet beds, electrical cords, etc? Yes  Adequate lighting in your home to reduce risk of falls? Yes   ASSISTIVE DEVICES UTILIZED TO PREVENT FALLS: Life alert? No  Use of a cane, walker or w/c? No   TIMED UP AND GO: Was the test performed? No .   Cognitive Function:    10/04/2018    9:25 AM 09/30/2017    9:19 AM 09/30/2016    9:56 AM 09/30/2015   10:26 AM  MMSE - Mini Mental State Exam  Orientation to time '5 5 5 5  '$ Orientation to Place '5 5 5 5  '$ Registration '3 3 3 3  '$ Attention/ Calculation '5 5 5 5  '$ Recall '3 3 3 3  '$ Language- name 2 objects '2 2 2 2  '$ Language- repeat '1 1 1 1  '$ Language- follow 3 step command '3 3 3 3  '$ Language- read & follow direction '1 1 1 1  '$ Write a sentence '1 1 1 1  '$ Copy design '1 1 1 1  '$ Total score '30 30 30 30        '$ 01/04/2023   10:30 AM 12/08/2019    9:53 AM  6CIT Screen  What Year? 0 points 0 points  What month? 0 points 0 points  What time? 0 points 0 points  Count back from 20 0 points 0 points  Months in reverse 0 points 0 points  Repeat phrase 0 points 2 points  Total Score 0 points 2 points    Immunizations Immunization History  Administered Date(s) Administered   Covid-19, Mrna,Vaccine(Spikevax)80yr and older 10/21/2022   Fluad Quad(high Dose 65+) 09/29/2021, 09/10/2022   Influenza Split 10/27/2012   Influenza, High Dose Seasonal PF 09/30/2016, 10/04/2018   Influenza,inj,Quad PF,6+ Mos 08/16/2014, 08/20/2015   Influenza,inj,quad, With Preservative 10/20/2017, 10/17/2019   Influenza-Unspecified 09/25/2020, 09/10/2022   PFIZER(Purple Top)SARS-COV-2 Vaccination 02/01/2020, 02/22/2020, 09/25/2020   Pfizer Covid-19 Vaccine Bivalent Booster 181yr& up 10/14/2021   Pneumococcal Conjugate-13 10/17/2019   Pneumococcal Polysaccharide-23 06/05/2013   Respiratory Syncytial Virus Vaccine,Recomb Aduvanted(Arexvy) 09/29/2022   Rsv, Bivalent, Protein Subunit Rsvpref,pf (AEvans Lance10/09/2022   Tdap 01/11/2013   Zoster, Live 06/04/2011    Screening Tests Health Maintenance  Topic Date Due   Zoster Vaccines- Shingrix (1 of 2) 01/21/2023 (Originally 01/28/1996)   DTaP/Tdap/Td (2 - Td or Tdap) 01/11/2023   MAMMOGRAM  02/24/2023   Medicare Annual Wellness (AWV)  01/05/2024   Pneumonia Vaccine 6570Years old  Completed   INFLUENZA VACCINE  Completed   DEXA SCAN  Completed   COVID-19 Vaccine  Completed   Hepatitis C Screening  Completed   HPV VACCINES  Aged Out   COLONOSCOPY (Pts [id number] coverage will need to be confirmed)  Discontinued   Health Maintenance There are no preventive care reminders to display for this patient.  Mammogram- scheduled with [facility].   Lung Cancer Screening: (Low Dose CT Chest recommended if Age 61-1-80ars, 30 pack-year currently smoking OR have quit w/in 15years.) does not qualify.   Hepatitis C Screening: Completed 09/2015.  Vision Screening: Recommended annual ophthalmology exams for early detection of glaucoma and other disorders of the eye.  Dental Screening: Recommended annual dental exams for proper oral hygiene.  Community Resource Referral / Chronic Care Management: CRR required this visit?  No   CCM required this visit?  No      Plan:     I have personally reviewed and noted the following in the patient's chart:   Medical and social history Use of alcohol, tobacco or illicit drugs  Current medications and supplements including opioid prescriptions. Patient is not currently taking opioid prescriptions. Functional ability and status Nutritional status Physical activity Advanced directives List of other physicians Hospitalizations, surgeries, and ER visits in previous 12 months Vitals Screenings to include cognitive, depression, and falls Referrals and appointments  In addition, I have reviewed and discussed with patient certain preventive protocols, quality metrics, and best practice recommendations. A written personalized care plan for preventive services as  well as general preventive health recommendations were provided to patient.     Leta Jungling, LPN   8/37/2902

## 2023-01-04 NOTE — Patient Instructions (Addendum)
[name] , Thank you for taking time to come for your Medicare Wellness Visit. I appreciate your ongoing commitment to your health goals. Please review the following plan we discussed and let me know if I can assist you in the future.   These are the goals we discussed:  Goals      Maintain Healthy Lifestyle     Keep all routine scheduled appointments Stay active Lose about 5lb        This is a list of the screening recommended for you and due dates:  Health Maintenance  Topic Date Due   Zoster (Shingles) Vaccine (1 of 2) 01/21/2023*   DTaP/Tdap/Td vaccine (2 - Td or Tdap) 01/11/2023   Mammogram  02/24/2023   Medicare Annual Wellness Visit  01/05/2024   Pneumonia Vaccine  Completed   Flu Shot  Completed   DEXA scan (bone density measurement)  Completed   COVID-19 Vaccine  Completed   Hepatitis C Screening: USPSTF Recommendation to screen - Ages 24-79 yo.  Completed   HPV Vaccine  Aged Out   Colon Cancer Screening  Discontinued  *Topic was postponed. The date shown is not the original due date.    Advanced directives: on file  Conditions/risks identified: none new  Next appointment: Follow up in one year for your annual wellness visit    Preventive Care 65 Years and Older, Female Preventive care refers to lifestyle choices and visits with your health care provider that can promote health and wellness. What does preventive care include? A yearly physical exam. This is also called an annual well check. Dental exams once or twice a year. Routine eye exams. Ask your health care provider how often you should have your eyes checked. Personal lifestyle choices, including: Daily care of your teeth and gums. Regular physical activity. Eating a healthy diet. Avoiding tobacco and drug use. Limiting alcohol use. Practicing safe sex. Taking low-dose aspirin every day. Taking vitamin and mineral supplements as recommended by your health care provider. What happens during an  annual well check? The services and screenings done by your health care provider during your annual well check will depend on your age, overall health, lifestyle risk factors, and family history of disease. Counseling  Your health care provider may ask you questions about your: Alcohol use. Tobacco use. Drug use. Emotional well-being. Home and relationship well-being. Sexual activity. Eating habits. History of falls. Memory and ability to understand (cognition). Work and work Statistician. Reproductive health. Screening  You may have the following tests or measurements: Height, weight, and BMI. Blood pressure. Lipid and cholesterol levels. These may be checked every 5 years, or more frequently if you are over 84 years old. Skin check. Lung cancer screening. You may have this screening every year starting at age 88 if you have a 30-pack-year history of smoking and currently smoke or have quit within the past 15 years. Fecal occult blood test (FOBT) of the stool. You may have this test every year starting at age 79. Flexible sigmoidoscopy or colonoscopy. You may have a sigmoidoscopy every 5 years or a colonoscopy every 10 years starting at age 8. Hepatitis C blood test. Hepatitis B blood test. Sexually transmitted disease (STD) testing. Diabetes screening. This is done by checking your blood sugar (glucose) after you have not eaten for a while (fasting). You may have this done every 1-3 years. Bone density scan. This is done to screen for osteoporosis. You may have this done starting at age 18. Mammogram. This may  be done every 1-2 years. Talk to your health care provider about how often you should have regular mammograms. Talk with your health care provider about your test results, treatment options, and if necessary, the need for more tests. Vaccines  Your health care provider may recommend certain vaccines, such as: Influenza vaccine. This is recommended every year. Tetanus,  diphtheria, and acellular pertussis (Tdap, Td) vaccine. You may need a Td booster every 10 years. Zoster vaccine. You may need this after age 56. Pneumococcal 13-valent conjugate (PCV13) vaccine. One dose is recommended after age 47. Pneumococcal polysaccharide (PPSV23) vaccine. One dose is recommended after age 12. Talk to your health care provider about which screenings and vaccines you need and how often you need them. This information is not intended to replace advice given to you by your health care provider. Make sure you discuss any questions you have with your health care provider. Document Released: 01/03/2016 Document Revised: 08/26/2016 Document Reviewed: 10/08/2015 Elsevier Interactive Patient Education  2017 [employer] Prevention in the Home Falls can cause injuries. They can happen to people of all ages. There are many things you can do to make your home safe and to help prevent falls. What can I do on the outside of my home? Regularly fix the edges of walkways and driveways and fix any cracks. Remove anything that might make you trip as you walk through a door, such as a raised step or threshold. Trim any bushes or trees on the path to your home. Use bright outdoor lighting. Clear any walking paths of anything that might make someone trip, such as rocks or tools. Regularly check to see if handrails are loose or broken. Make sure that both sides of any steps have handrails. Any raised decks and porches should have guardrails on the edges. Have any leaves, snow, or ice cleared regularly. Use sand or salt on walking paths during winter. Clean up any spills in your garage right away. This includes oil or grease spills. What can I do in the bathroom? Use night lights. Install grab bars by the toilet and in the tub and shower. Do not use towel bars as grab bars. Use non-skid mats or decals in the tub or shower. If you need to sit down in the shower, use a plastic,  non-slip stool. Keep the floor dry. Clean up any water that spills on the floor as soon as it happens. Remove soap buildup in the tub or shower regularly. Attach bath mats securely with double-sided non-slip rug tape. Do not have throw rugs and other things on the floor that can make you trip. What can I do in the bedroom? Use night lights. Make sure that you have a light by your bed that is easy to reach. Do not use any sheets or blankets that are too big for your bed. They should not hang down onto the floor. Have a firm chair that has side arms. You can use this for support while you get dressed. Do not have throw rugs and other things on the floor that can make you trip. What can I do in the kitchen? Clean up any spills right away. Avoid walking on wet floors. Keep items that you use a lot in easy-to-reach places. If you need to reach something above you, use a strong step stool that has a grab bar. Keep electrical cords out of the way. Do not use floor polish or wax that makes floors slippery. If you must use  wax, use non-skid floor wax. Do not have throw rugs and other things on the floor that can make you trip. What can I do with my stairs? Do not leave any items on the stairs. Make sure that there are handrails on both sides of the stairs and use them. Fix handrails that are broken or loose. Make sure that handrails are as long as the stairways. Check any carpeting to make sure that it is firmly attached to the stairs. Fix any carpet that is loose or worn. Avoid having throw rugs at the top or bottom of the stairs. If you do have throw rugs, attach them to the floor with carpet tape. Make sure that you have a light switch at the top of the stairs and the bottom of the stairs. If you do not have them, ask someone to add them for you. What else can I do to help prevent falls? Wear shoes that: Do not have high heels. Have rubber bottoms. Are comfortable and fit you well. Are closed  at the toe. Do not wear sandals. If you use a stepladder: Make sure that it is fully opened. Do not climb a closed stepladder. Make sure that both sides of the stepladder are locked into place. Ask someone to hold it for you, if possible. Clearly mark and make sure that you can see: Any grab bars or handrails. First and last steps. Where the edge of each step is. Use tools that help you move around (mobility aids) if they are needed. These include: Canes. Walkers. Scooters. Crutches. Turn on the lights when you go into a dark area. Replace any light bulbs as soon as they burn out. Set up your furniture so you have a clear path. Avoid moving your furniture around. If any of your floors are uneven, fix them. If there are any pets around you, be aware of where they are. Review your medicines with your doctor. Some medicines can make you feel dizzy. This can increase your chance of falling. Ask your doctor what other things that you can do to help prevent falls. This information is not intended to replace advice given to you by your health care provider. Make sure you discuss any questions you have with your health care provider. Document Released: 10/03/2009 Document Revised: 05/14/2016 Document Reviewed: 01/11/2015 Elsevier Interactive Patient Education  2017 [employer].

## 2023-01-11 ENCOUNTER — Other Ambulatory Visit: Payer: Self-pay | Admitting: Internal Medicine

## 2023-01-12 ENCOUNTER — Other Ambulatory Visit: Payer: Self-pay | Admitting: Internal Medicine

## 2023-01-13 ENCOUNTER — Other Ambulatory Visit: Payer: Self-pay | Admitting: Internal Medicine

## 2023-01-14 ENCOUNTER — Encounter: Payer: Self-pay | Admitting: Internal Medicine

## 2023-01-14 ENCOUNTER — Other Ambulatory Visit: Payer: Self-pay | Admitting: Internal Medicine

## 2023-01-14 ENCOUNTER — Ambulatory Visit (INDEPENDENT_AMBULATORY_CARE_PROVIDER_SITE_OTHER): Payer: Medicare Other | Admitting: Internal Medicine

## 2023-01-14 ENCOUNTER — Telehealth: Payer: Self-pay | Admitting: Internal Medicine

## 2023-01-14 VITALS — BP 132/76 | HR 85 | Temp 97.6°F | Resp 16 | Ht 62.0 in | Wt 120.0 lb

## 2023-01-14 DIAGNOSIS — R7989 Other specified abnormal findings of blood chemistry: Secondary | ICD-10-CM

## 2023-01-14 DIAGNOSIS — D219 Benign neoplasm of connective and other soft tissue, unspecified: Secondary | ICD-10-CM

## 2023-01-14 DIAGNOSIS — E7800 Pure hypercholesterolemia, unspecified: Secondary | ICD-10-CM | POA: Diagnosis not present

## 2023-01-14 DIAGNOSIS — E559 Vitamin D deficiency, unspecified: Secondary | ICD-10-CM

## 2023-01-14 DIAGNOSIS — I10 Essential (primary) hypertension: Secondary | ICD-10-CM

## 2023-01-14 DIAGNOSIS — R519 Headache, unspecified: Secondary | ICD-10-CM | POA: Insufficient documentation

## 2023-01-14 DIAGNOSIS — F439 Reaction to severe stress, unspecified: Secondary | ICD-10-CM

## 2023-01-14 DIAGNOSIS — R634 Abnormal weight loss: Secondary | ICD-10-CM

## 2023-01-14 LAB — BASIC METABOLIC PANEL
BUN: 17 mg/dL (ref 6–23)
CO2: 29 mEq/L (ref 19–32)
Calcium: 9.8 mg/dL (ref 8.4–10.5)
Chloride: 105 mEq/L (ref 96–112)
Creatinine, Ser: 0.69 mg/dL (ref 0.40–1.20)
GFR: 83.98 mL/min (ref >60.00)
Glucose, Bld: 88 mg/dL (ref 70–99)
Potassium: 3.8 mEq/L (ref 3.5–5.1)
Sodium: 142 mEq/L (ref 135–145)

## 2023-01-14 LAB — HEPATIC FUNCTION PANEL
ALT: 16 U/L (ref 0–35)
AST: 16 U/L (ref 0–37)
Albumin: 4 g/dL (ref 3.5–5.2)
Alkaline Phosphatase: 83 U/L (ref 39–117)
Bilirubin, Direct: 0.1 mg/dL (ref 0.0–0.3)
Total Bilirubin: 0.5 mg/dL (ref 0.2–1.2)
Total Protein: 6.1 g/dL (ref 6.0–8.3)

## 2023-01-14 LAB — LIPID PANEL
Cholesterol: 153 mg/dL (ref 0–200)
HDL: 54.7 mg/dL (ref >39.00)
LDL Cholesterol: 77 mg/dL (ref 0–99)
NonHDL: 98.55
Total CHOL/HDL Ratio: 3
Triglycerides: 110 mg/dL (ref 0.0–149.0)
VLDL: 22 mg/dL (ref 0.0–40.0)

## 2023-01-14 LAB — VITAMIN D 25 HYDROXY (VIT D DEFICIENCY, FRACTURES): VITD: 37.39 ng/mL (ref 30.00–100.00)

## 2023-01-14 LAB — TSH: TSH: 1.75 u[IU]/mL (ref 0.35–5.50)

## 2023-01-14 NOTE — Assessment & Plan Note (Signed)
She relates her headache to the weather change/pressure change.  No acute change with headache.  Will follow.  Notify me if symptoms worsen or progress.  Continue saline nasal spray.

## 2023-01-14 NOTE — Assessment & Plan Note (Signed)
Continue lipitor.  Low cholesterol diet and exercise.  Follow lipid panel and liver function tests.   

## 2023-01-14 NOTE — Telephone Encounter (Signed)
Pharmacy called stating the pt medication-(VITAMIN D3) is saying not appropriate when she sends an electronic fax

## 2023-01-14 NOTE — Assessment & Plan Note (Signed)
Continue amlodipine and hctz.  Blood pressure running a little higher in the office today. She will spot check her pressure and send in readings.  Follow pressures.  Follow metabolic panel.

## 2023-01-14 NOTE — Assessment & Plan Note (Signed)
Completed her prescription vitamin D.  On otc vitamin D supplements now.  Recheck vitamin D level today.

## 2023-01-14 NOTE — Assessment & Plan Note (Signed)
Saw urology 06/26/22 - recommended f/u ultrasound in 05/2023.

## 2023-01-14 NOTE — Progress Notes (Signed)
Patient ID: Jamie Elliott, female   DOB: 06/17/1946, 77 y.o.   MRN: [id number]   Subjective:    Patient ID: Jamie Elliott, female    DOB: 1946-07-16, 77 y.o.   MRN: [id number]   Patient here for  Chief Complaint  Patient presents with   Medical Management of Chronic Issues   .   HPI Follow up regarding her blood pressure and cholesterol. Reports she is doing relatively well. Has adjusted her diet.  Her son has adjusted his diet and she has been doing this with him.  No chest pain or sob reported.  No increased cough or congestion.  No abdominal pain or bowels change reported.  Stays active.  Has lost weight with above diet changes.  She does report headache today.  Feels it is related to the pressure change with the weather.  No unusual headache.  No dizziness or light headedness.     Past Medical History:  Diagnosis Date   Environmental allergies    Hypercholesterolemia    Hypertension    Migraines    Past Surgical History:  Procedure Laterality Date   ABDOMINAL HYSTERECTOMY  2000   APPENDECTOMY  2000   CATARACT EXTRACTION W/PHACO Left 04/23/2021   Procedure: CATARACT EXTRACTION PHACO AND INTRAOCULAR LENS PLACEMENT (IOC) LEFT panoptix toric 10.41 01:22.8 12.6%;  Surgeon: Leandrew Koyanagi, MD;  Location: [facility];  Service: Ophthalmology;  Laterality: Left;   CATARACT EXTRACTION W/PHACO Right 05/07/2021   Procedure: CATARACT EXTRACTION PHACO AND INTRAOCULAR LENS PLACEMENT (IOC) RIGHT  panoptix 12.85 01:33.8 13.7%;  Surgeon: Leandrew Koyanagi, MD;  Location: [facility];  Service: Ophthalmology;  Laterality: Right;   NOSE SURGERY  1967   SEPTOPLASTY     SHOULDER ARTHROSCOPY WITH OPEN ROTATOR CUFF REPAIR Right 12/05/2015   Procedure: SHOULDER ARTHROSCOPY WITH MINI OPEN ROTATOR CUFF REPAIR. DISTAL CLAVICLE EXCISION;  Surgeon: Thornton Park, MD;  Location: [facility];  Service: Orthopedics;  Laterality: Right;   Family History  Problem Relation Age of Onset    Heart disease Mother    Hyperlipidemia Mother    Hypertension Mother    Cancer Father        lung, prostate   Hyperlipidemia Father    Hypertension Father    Prostate cancer Father    Bladder Cancer Neg Hx    Kidney cancer Neg Hx    Social History   Socioeconomic History   Marital status: Married    Spouse name: Not on file   Number of children: 2   Years of education: Not on file   Highest education level: Not on file  Occupational History   Not on file  Tobacco Use   Smoking status: Never   Smokeless tobacco: Never  Vaping Use   Vaping Use: Never used  Substance and Sexual Activity   Alcohol use: No    Alcohol/week: 0.0 standard drinks of alcohol   Drug use: No   Sexual activity: Not Currently  Other Topics Concern   Not on file  Social History Narrative   Not on file   Social Determinants of Health   Financial Resource Strain: Low Risk  (01/04/2023)   Overall Financial Resource Strain (CARDIA)    Difficulty of Paying Living Expenses: Not hard at all  Food Insecurity: No Food Insecurity (01/04/2023)   Hunger Vital Sign    Worried About Running Out of Food in the Last Year: Never true    Ran Out of Food in the Last Year: Never true  Transportation Needs: No Transportation Needs (01/04/2023)   PRAPARE - Hydrologist (Medical): No    Lack of Transportation (Non-Medical): No  Physical Activity: Sufficiently Active (01/04/2023)   Exercise Vital Sign    Days of Exercise per Week: 5 days    Minutes of Exercise per Session: 40 min  Stress: No Stress Concern Present (01/04/2023)   [place]    Feeling of Stress : Not at all  Social Connections: Unknown (01/04/2023)   Social Connection and Isolation Panel [NHANES]    Frequency of Communication with Friends and Family: More than three times a week    Frequency of Social Gatherings with Friends and Family: More than three times a  week    Attends Religious Services: Not on Advertising copywriter or Organizations: Yes    Attends Archivist Meetings: More than 4 times per year    Marital Status: Married     Review of Systems  Constitutional:  Negative for appetite change and unexpected weight change.  HENT:  Negative for congestion and sinus pressure.   Respiratory:  Negative for cough, chest tightness and shortness of breath.   Cardiovascular:  Negative for chest pain, palpitations and leg swelling.  Gastrointestinal:  Negative for abdominal pain, diarrhea, nausea and vomiting.  Genitourinary:  Negative for difficulty urinating and dysuria.  Musculoskeletal:  Negative for joint swelling and myalgias.  Skin:  Negative for color change and rash.  Neurological:  Negative for dizziness.       Headache today as outlined.   Psychiatric/Behavioral:  Negative for agitation and dysphoric mood.        Objective:     BP 132/76   Pulse 85   Temp 97.6 F (36.4 C)   Resp 16   Ht '5\' 2"'$  (1.575 m)   Wt 120 lb (54.4 kg)   LMP 11/28/1998   SpO2 99%   BMI 21.95 kg/m  Wt Readings from Last 3 Encounters:  01/14/23 120 lb (54.4 kg)  01/04/23 125 lb (56.7 kg)  10/21/22 125 lb 6.4 oz (56.9 kg)    Physical Exam Vitals reviewed.  Constitutional:      General: She is not in acute distress.    Appearance: Normal appearance.  HENT:     Head: Normocephalic and atraumatic.     Right Ear: External ear normal.     Left Ear: External ear normal.  Eyes:     General: No scleral icterus.       Right eye: No discharge.        Left eye: No discharge.     Conjunctiva/sclera: Conjunctivae normal.  Neck:     Thyroid: No thyromegaly.  Cardiovascular:     Rate and Rhythm: Normal rate and regular rhythm.  Pulmonary:     Effort: No respiratory distress.     Breath sounds: Normal breath sounds. No wheezing.  Abdominal:     General: Bowel sounds are normal.     Palpations: Abdomen is soft.     Tenderness:  There is no abdominal tenderness.  Musculoskeletal:        General: No swelling or tenderness.     Cervical back: Neck supple. No tenderness.  Lymphadenopathy:     Cervical: No cervical adenopathy.  Skin:    Findings: No erythema or rash.  Neurological:     Mental Status: She is alert.  Psychiatric:  Mood and Affect: Mood normal.        Behavior: Behavior normal.      Outpatient Encounter Medications as of 01/14/2023  Medication Sig   acetaminophen (TYLENOL) 500 MG tablet Take 500 mg by mouth every 6 (six) hours as needed.   amLODipine (NORVASC) 5 MG tablet Take 1 tablet (5 mg total) by mouth daily.   Ascorbic Acid (VITAMIN C) 1000 MG tablet Take 1,000 mg by mouth daily.   atorvastatin (LIPITOR) 40 MG tablet Take 1 tablet (40 mg total) by mouth daily.   cetirizine (ZYRTEC) 10 MG tablet Take 1 tablet (10 mg total) by mouth daily.   Cholecalciferol (VITAMIN D3) 25 MCG (1000 UT) CAPS Take 1 capsule by mouth daily.   CRANBERRY EXTRACT PO Take 1 capsule by mouth 2 (two) times daily.   D-Mannose 500 MG CAPS Take 500 mg by mouth in the morning and at bedtime.   hydrochlorothiazide (HYDRODIURIL) 12.5 MG tablet TAKE ONE TABLET DAILY.   losartan (COZAAR) 100 MG tablet Take 1 tablet by mouth daily.   polyethylene glycol (MIRALAX / GLYCOLAX) 17 g packet Take 17 g by mouth every other day.   Probiotic Product (ALIGN PO) Take by mouth as needed.   triamcinolone (NASACORT) 55 MCG/ACT nasal inhaler Place 2 sprays into the nose as needed.   [DISCONTINUED] sulfamethoxazole-trimethoprim (BACTRIM DS) 800-160 MG tablet Take 1 tablet by mouth 2 (two) times daily.   [DISCONTINUED] Vitamin D, Ergocalciferol, (DRISDOL) 1.25 MG (50000 UNIT) CAPS capsule Take 1 capsule (50,000 Units total) by mouth every 7 (seven) days.   No facility-administered encounter medications on file as of 01/14/2023.     Lab Results  Component Value Date   WBC 6.0 04/07/2022   HGB 13.6 04/07/2022   HCT 40.0 04/07/2022    PLT 227.0 04/07/2022   GLUCOSE 80 10/16/2022   CHOL 168 10/16/2022   TRIG 112.0 10/16/2022   HDL 58.10 10/16/2022   LDLDIRECT 103.0 05/21/2015   LDLCALC 87 10/16/2022   ALT 18 10/16/2022   AST 15 10/16/2022   NA 141 10/16/2022   K 3.9 10/16/2022   CL 104 10/16/2022   CREATININE 0.73 10/16/2022   BUN 18 10/16/2022   CO2 29 10/16/2022   TSH 1.58 03/12/2022   INR 1.0 03/17/2022    Ultrasound renal complete  Result Date: 06/26/2022 CLINICAL DATA:  Recurrent UTI EXAM: RENAL / URINARY TRACT ULTRASOUND COMPLETE COMPARISON:  Renal ultrasound 06/06/2021 FINDINGS: Right Kidney: Renal measurements: 9.1 x 3.9 x 4.1 cm = volume: 76 mL. Echogenicity within normal limits. No mass or hydronephrosis visualized. There is a shadowing calculus measuring 0.5 cm. Left Kidney: Renal measurements: 9.6 x 4.4 x 4.2 cm = volume: 95 mL. Echogenicity within normal limits. No hydronephrosis. There are simple cysts in the superior pole measuring 2.7 cm and in the midpole measuring 1.2 cm. Bladder: Appears normal for degree of bladder distention. Other: None. IMPRESSION: 1.  Nonobstructing right renal calculus. 2.  Benign cysts in the left kidney. Electronically Signed   By: Audie Pinto M.D.   On: 06/26/2022 12:21       Assessment & Plan:   Problem List Items Addressed This Visit     Vitamin D deficiency    Completed her prescription vitamin D.  On otc vitamin D supplements now.  Recheck vitamin D level today.       Relevant Orders   VITAMIN D 25 Hydroxy (Vit-D Deficiency, Fractures)   Stress    Overall appears to be doing  better.  Follow       Loss of weight    She has adjusted her diet.  Lost weight.  Feels better.       Hypercholesterolemia    Continue lipitor.  Low cholesterol diet and exercise.  Follow lipid panel and liver function tests.       Relevant Orders   TSH   Lipid panel   Hepatic function panel   HTN (hypertension) - Primary    Continue amlodipine and hctz.  Blood pressure  running a little higher in the office today. She will spot check her pressure and send in readings.  Follow pressures.  Follow metabolic panel.        Relevant Orders   Basic metabolic panel   Headache    She relates her headache to the weather change/pressure change.  No acute change with headache.  Will follow.  Notify me if symptoms worsen or progress.  Continue saline nasal spray.       Angioleiomyoma    Saw urology 06/26/22 - recommended f/u ultrasound in 05/2023.       Abnormal liver function tests    Follow liver function tests.         Einar Pheasant, MD

## 2023-01-14 NOTE — Assessment & Plan Note (Signed)
Follow liver function tests.

## 2023-01-14 NOTE — Assessment & Plan Note (Signed)
Overall appears to be doing better.  Follow.  

## 2023-01-14 NOTE — Assessment & Plan Note (Signed)
She has adjusted her diet.  Lost weight.  Feels better.

## 2023-01-15 NOTE — Telephone Encounter (Signed)
I want her to stop taking the prescription vitamin D.  She told me she had completed her prescription.  Start otc vitamin D3 1000 units per day.

## 2023-01-15 NOTE — Telephone Encounter (Signed)
Pharm advised Pt was on auto refill

## 2023-02-25 DIAGNOSIS — Z1231 Encounter for screening mammogram for malignant neoplasm of breast: Secondary | ICD-10-CM | POA: Diagnosis not present

## 2023-02-25 LAB — HM MAMMOGRAPHY

## 2023-03-02 DIAGNOSIS — N6321 Unspecified lump in the left breast, upper outer quadrant: Secondary | ICD-10-CM | POA: Diagnosis not present

## 2023-03-02 DIAGNOSIS — C50412 Malignant neoplasm of upper-outer quadrant of left female breast: Secondary | ICD-10-CM | POA: Diagnosis not present

## 2023-03-02 DIAGNOSIS — D0512 Intraductal carcinoma in situ of left breast: Secondary | ICD-10-CM | POA: Diagnosis not present

## 2023-03-02 DIAGNOSIS — R928 Other abnormal and inconclusive findings on diagnostic imaging of breast: Secondary | ICD-10-CM | POA: Diagnosis not present

## 2023-03-02 DIAGNOSIS — Z170 Estrogen receptor positive status [ER+]: Secondary | ICD-10-CM | POA: Diagnosis not present

## 2023-03-02 DIAGNOSIS — N6489 Other specified disorders of breast: Secondary | ICD-10-CM | POA: Diagnosis not present

## 2023-03-08 ENCOUNTER — Telehealth: Payer: Self-pay | Admitting: Internal Medicine

## 2023-03-08 DIAGNOSIS — C50919 Malignant neoplasm of unspecified site of unspecified female breast: Secondary | ICD-10-CM

## 2023-03-08 NOTE — Telephone Encounter (Signed)
Pt called stating she need a referral to the cancer cernter because she has been diagnosed with cancer

## 2023-03-08 NOTE — Telephone Encounter (Signed)
You sent me a note to see you before calling. It looks like this patient was set up with [facility]. Let me know if there was anything in particular I need to let her know.

## 2023-03-08 NOTE — Telephone Encounter (Signed)
The previous message was to get more information about if she had biopsy results, etc.  Thank her for letting us know.  I am ok with placing order for referral.  Does she plan to continue at [facility] or need referral here.

## 2023-03-08 NOTE — Telephone Encounter (Signed)
Patient has been set up with [facility] per chart. Will confirm with patient nothing else needed.

## 2023-03-09 NOTE — Telephone Encounter (Signed)
I pended the referral for you, just wanted to make sure that information was correct prior to signing since she was seeing [facility] and will switch to Dr Rogue Bussing for treatments. She would prefer to do this because she will be closer to home for treatments.  Also, Fyi her appt at [facility] has been changed to 4/3 because she changed doctors.

## 2023-03-09 NOTE — Telephone Encounter (Signed)
Patient would like to see Dr Rogue Bussing at the cancer center in [place].

## 2023-03-09 NOTE — Telephone Encounter (Signed)
Patient called and would like to have treatment in [place] at [facility], please send referral. Patient would like a call from office on referral is ready.  Patient will have surgery through [facility], no date has been scheduled.

## 2023-03-09 NOTE — Addendum Note (Signed)
Addended by: Roetta Sessions D on: 03/09/2023 09:28 AM   Modules accepted: Orders

## 2023-03-10 NOTE — Addendum Note (Signed)
Addended by: Alisa Graff on: 03/10/2023 04:46 AM   Modules accepted: Orders

## 2023-03-10 NOTE — Telephone Encounter (Signed)
Order signed for oncology referral (Dr Rogue Bussing). Someone should be contacting her with an appt date and time.

## 2023-03-10 NOTE — Telephone Encounter (Signed)
Patient is aware 

## 2023-03-15 ENCOUNTER — Encounter: Payer: Self-pay | Admitting: Family Medicine

## 2023-03-15 ENCOUNTER — Ambulatory Visit (INDEPENDENT_AMBULATORY_CARE_PROVIDER_SITE_OTHER): Payer: Medicare Other | Admitting: Family Medicine

## 2023-03-15 VITALS — BP 112/68 | HR 105 | Temp 99.3°F | Ht 62.0 in | Wt 121.4 lb

## 2023-03-15 DIAGNOSIS — R490 Dysphonia: Secondary | ICD-10-CM | POA: Diagnosis not present

## 2023-03-15 DIAGNOSIS — J069 Acute upper respiratory infection, unspecified: Secondary | ICD-10-CM | POA: Diagnosis not present

## 2023-03-15 DIAGNOSIS — J398 Other specified diseases of upper respiratory tract: Secondary | ICD-10-CM

## 2023-03-15 LAB — POC COVID19 BINAXNOW: SARS Coronavirus 2 Ag: NEGATIVE

## 2023-03-15 NOTE — Assessment & Plan Note (Addendum)
Symptoms are consistent with a viral upper respiratory infection.  Rapid COVID test negative.  Will do a COVID PCR test.  I have asked her to stay home until this test result returns.  Discussed supportive care with continuing her Nasacort and her Zyrtec.  Advise she could try Delsym or Robitussin over-the-counter for cough if needed.  If she felt she needed something  stronger than that she should let us know.  If her symptoms worsen at all or persist she will let us know.  Patient does report she has a visit with the cancer center later this week for breast cancer.  Discussed at some point she should let them know that she has a respiratory illness and they can decide if they would like her to come in person for her visit.

## 2023-03-15 NOTE — Patient Instructions (Signed)
Nice to meet you. You can try Delsym or Robitussin over-the-counter if needed for cough. If your symptoms worsen significantly or if they persist longer than 7 to 10 days please let us know.

## 2023-03-15 NOTE — Progress Notes (Signed)
Tommi Rumps, MD Phone: [phone]  JAKALYA MONES is a 77 y.o. female who presents today for same-day visit.  Scratchy throat: Patient notes onset of symptoms yesterday.  She has scratchy throat with postnasal drip.  She notes some mild cough related to this.  Notes she is losing her voice.  She notes no fever or shortness of breath.  She noted some chills yesterday.  She did have a sore throat yesterday.  No taste or smell disturbances.  No known COVID exposures.  She tried Mucinex and gargling with salt water.  Social History   Tobacco Use  Smoking Status Never  Smokeless Tobacco Never    Current Outpatient Medications on File Prior to Visit  Medication Sig Dispense Refill   acetaminophen (TYLENOL) 500 MG tablet Take 500 mg by mouth every 6 (six) hours as needed.     amLODipine (NORVASC) 5 MG tablet Take 1 tablet (5 mg total) by mouth daily. 90 tablet 1   Ascorbic Acid (VITAMIN C) 1000 MG tablet Take 1,000 mg by mouth daily.     atorvastatin (LIPITOR) 40 MG tablet Take 1 tablet (40 mg total) by mouth daily. 90 tablet 0   cetirizine (ZYRTEC) 10 MG tablet Take 1 tablet (10 mg total) by mouth daily. 30 tablet 0   Cholecalciferol (VITAMIN D3) 25 MCG (1000 UT) CAPS Take 1 capsule by mouth daily.     CRANBERRY EXTRACT PO Take 1 capsule by mouth 2 (two) times daily.     D-Mannose 500 MG CAPS Take 500 mg by mouth in the morning and at bedtime.     hydrochlorothiazide (HYDRODIURIL) 12.5 MG tablet TAKE ONE TABLET DAILY. 90 tablet 0   losartan (COZAAR) 100 MG tablet Take 1 tablet by mouth daily. 90 tablet 0   polyethylene glycol (MIRALAX / GLYCOLAX) 17 g packet Take 17 g by mouth every other day.     Probiotic Product (ALIGN PO) Take by mouth as needed.     triamcinolone (NASACORT) 55 MCG/ACT nasal inhaler Place 2 sprays into the nose as needed. 1 Inhaler 5   No current facility-administered medications on file prior to visit.     ROS see history of present  illness  Objective  Physical Exam Vitals:   03/15/23 1059  BP: 112/68  Pulse: (!) 105  Temp: 99.3 F (37.4 C)  SpO2: 98%    BP Readings from Last 3 Encounters:  03/15/23 112/68  01/14/23 132/76  01/04/23 126/61   Wt Readings from Last 3 Encounters:  03/15/23 121 lb 6.4 oz (55.1 kg)  01/14/23 120 lb (54.4 kg)  01/04/23 125 lb (56.7 kg)    Physical Exam Constitutional:      General: She is not in acute distress.    Appearance: She is not diaphoretic.  HENT:     Right Ear: Tympanic membrane normal.     Left Ear: Tympanic membrane normal.     Mouth/Throat:     Mouth: Mucous membranes are moist.     Pharynx: Oropharynx is clear.  Cardiovascular:     Rate and Rhythm: Normal rate and regular rhythm.     Heart sounds: Normal heart sounds.  Pulmonary:     Effort: Pulmonary effort is normal.     Breath sounds: Normal breath sounds.  Lymphadenopathy:     Cervical: No cervical adenopathy.  Skin:    General: Skin is warm and dry.  Neurological:     Mental Status: She is alert.      Assessment/Plan: Please  see individual problem list.  Congestion of upper respiratory tract -     POC COVID-19 BinaxNow -     Novel Coronavirus, NAA ([facility])  Hoarse -     POC COVID-19 BinaxNow  Viral URI Assessment & Plan: Symptoms are consistent with a viral upper respiratory infection.  Rapid COVID test negative.  Will do a COVID PCR test.  I have asked her to stay home until this test result returns.  Discussed supportive care with continuing her Nasacort and her Zyrtec.  Advise she could try Delsym or Robitussin over-the-counter for cough if needed.  If she felt she needed something  stronger than that she should let us know.  If her symptoms worsen at all or persist she will let us know.  Patient does report she has a visit with the cancer center later this week for breast cancer.  Discussed at some point she should let them know that she has a respiratory illness and they can decide  if they would like her to come in person for her visit.     Return if symptoms worsen or fail to improve.   Tommi Rumps, MD [place]

## 2023-03-16 LAB — NOVEL CORONAVIRUS, NAA: SARS-CoV-2, NAA: NOT DETECTED

## 2023-03-17 ENCOUNTER — Inpatient Hospital Stay: Payer: BC Managed Care – PPO

## 2023-03-17 ENCOUNTER — Inpatient Hospital Stay: Payer: BC Managed Care – PPO | Admitting: Internal Medicine

## 2023-03-17 ENCOUNTER — Encounter: Payer: Self-pay | Admitting: *Deleted

## 2023-03-17 ENCOUNTER — Ambulatory Visit (INDEPENDENT_AMBULATORY_CARE_PROVIDER_SITE_OTHER): Payer: Medicare Other | Admitting: Internal Medicine

## 2023-03-17 VITALS — BP 118/68 | HR 90 | Temp 98.0°F | Resp 16 | Ht 62.0 in | Wt 121.2 lb

## 2023-03-17 DIAGNOSIS — D219 Benign neoplasm of connective and other soft tissue, unspecified: Secondary | ICD-10-CM

## 2023-03-17 DIAGNOSIS — E7800 Pure hypercholesterolemia, unspecified: Secondary | ICD-10-CM | POA: Diagnosis not present

## 2023-03-17 DIAGNOSIS — R7989 Other specified abnormal findings of blood chemistry: Secondary | ICD-10-CM | POA: Diagnosis not present

## 2023-03-17 DIAGNOSIS — R0981 Nasal congestion: Secondary | ICD-10-CM

## 2023-03-17 DIAGNOSIS — I10 Essential (primary) hypertension: Secondary | ICD-10-CM

## 2023-03-17 DIAGNOSIS — F439 Reaction to severe stress, unspecified: Secondary | ICD-10-CM

## 2023-03-17 MED ORDER — LOSARTAN POTASSIUM 100 MG PO TABS: 100.0000 mg | ORAL_TABLET | Freq: Every day | ORAL | 2 refills | Status: DC

## 2023-03-17 MED ORDER — DOXYCYCLINE HYCLATE 100 MG PO TABS: 100.0000 mg | ORAL_TABLET | Freq: Two times a day (BID) | ORAL | 0 refills | Status: DC

## 2023-03-17 MED ORDER — ATORVASTATIN CALCIUM 40 MG PO TABS: 40.0000 mg | ORAL_TABLET | Freq: Every day | ORAL | 3 refills | Status: DC

## 2023-03-17 MED ORDER — AMLODIPINE BESYLATE 5 MG PO TABS: 5.0000 mg | ORAL_TABLET | Freq: Every day | ORAL | 2 refills | Status: DC

## 2023-03-17 NOTE — Progress Notes (Signed)
Subjective:    Patient ID: Jamie Elliott, female    DOB: [birth year], 77 y.o.   MRN: [id number]  Patient here for  Chief Complaint  Patient presents with   Medical Management of Chronic Issues    HPI Here to follow up regarding her blood pressure and cholesterol.  Recently diagnosed with breast cancer.  Planning to see Dr Rogue Bussing for evaluation and treatment.  Has appt scheduled today.  Was evaluated 03/15/23 - Dr Caryl Bis - viral URI.  Covid negative.  Instructed to continue zyrtec and nasacort. Discussed with her today.  She is having some increased nasal congestion.  Productive mucus.  Drainage down her throat.  Increased chest congestion.  Some cough.  No sob.  No chest pain.  No nausea or vomiting.  No abdominal pain.  No diarrhea.     Past Medical History:  Diagnosis Date   Environmental allergies    Hypercholesterolemia    Hypertension    Migraines    Past Surgical History:  Procedure Laterality Date   ABDOMINAL HYSTERECTOMY  2000   APPENDECTOMY  2000   CATARACT EXTRACTION W/PHACO Left 04/23/2021   Procedure: CATARACT EXTRACTION PHACO AND INTRAOCULAR LENS PLACEMENT (IOC) LEFT panoptix toric 10.41 01:22.8 12.6%;  Surgeon: Leandrew Koyanagi, MD;  Location: [facility];  Service: Ophthalmology;  Laterality: Left;   CATARACT EXTRACTION W/PHACO Right 05/07/2021   Procedure: CATARACT EXTRACTION PHACO AND INTRAOCULAR LENS PLACEMENT (IOC) RIGHT  panoptix 12.85 01:33.8 13.7%;  Surgeon: Leandrew Koyanagi, MD;  Location: [facility] [facility];  Service: Ophthalmology;  Laterality: Right;   NOSE SURGERY  1967   SEPTOPLASTY     SHOULDER ARTHROSCOPY WITH OPEN ROTATOR CUFF REPAIR Right 12/05/2015   Procedure: SHOULDER ARTHROSCOPY WITH MINI OPEN ROTATOR CUFF REPAIR. DISTAL CLAVICLE EXCISION;  Surgeon: Thornton Park, MD;  Location: [facility];  Service: Orthopedics;  Laterality: Right;   Family History  Problem Relation Age of Onset   Heart disease Mother    Hyperlipidemia  Mother    Hypertension Mother    Cancer Father        lung, prostate   Hyperlipidemia Father    Hypertension Father    Prostate cancer Father    Bladder Cancer Neg Hx    Kidney cancer Neg Hx    Social History   Socioeconomic History   Marital status: Married    Spouse name: Not on file   Number of children: 2   Years of education: Not on file   Highest education level: Bachelor's degree (e.g., BA, AB, BS)  Occupational History   Not on file  Tobacco Use   Smoking status: Never   Smokeless tobacco: Never  Vaping Use   Vaping Use: Never used  Substance and Sexual Activity   Alcohol use: No    Alcohol/week: 0.0 standard drinks of alcohol   Drug use: No   Sexual activity: Not Currently  Other Topics Concern   Not on file  Social History Narrative   Not on file   Social Determinants of Health   Financial Resource Strain: Low Risk  (03/15/2023)   Overall Financial Resource Strain (CARDIA)    Difficulty of Paying Living Expenses: Not hard at all  Food Insecurity: No Food Insecurity (03/15/2023)   Hunger Vital Sign    Worried About Running Out of Food in the Last Year: Never true    Ran Out of Food in the Last Year: Never true  Transportation Needs: No Transportation Needs (03/15/2023)   PRAPARE - Transportation  Lack of Transportation (Medical): No    Lack of Transportation (Non-Medical): No  Physical Activity: Sufficiently Active (03/15/2023)   Exercise Vital Sign    Days of Exercise per Week: 5 days    Minutes of Exercise per Session: 40 min  Stress: No Stress Concern Present (03/15/2023)   [place]    Feeling of Stress : Not at all  Social Connections: [place] (03/15/2023)   Social Connection and Isolation Panel [NHANES]    Frequency of Communication with Friends and Family: More than three times a week    Frequency of Social Gatherings with Friends and Family: Twice a week    Attends  Religious Services: More than 4 times per year    Active Member of [employer] or Organizations: Yes    Attends Music therapist: More than 4 times per year    Marital Status: Married     Review of Systems  Constitutional:  Negative for appetite change and unexpected weight change.  HENT:  Positive for congestion, postnasal drip and sinus pressure.   Respiratory:  Positive for cough. Negative for chest tightness and shortness of breath.   Cardiovascular:  Negative for chest pain and palpitations.  Gastrointestinal:  Negative for abdominal pain, diarrhea, nausea and vomiting.  Genitourinary:  Negative for difficulty urinating and dysuria.  Musculoskeletal:  Negative for joint swelling and myalgias.  Skin:  Negative for color change and rash.  Neurological:  Negative for dizziness and headaches.  Psychiatric/Behavioral:  Negative for agitation and dysphoric mood.        Objective:     BP 118/68   Pulse 90   Temp 98 F (36.7 C)   Resp 16   Ht 5\' 2"  (1.575 m)   Wt 121 lb 3.2 oz (55 kg)   LMP 11/28/1998   SpO2 98%   BMI 22.17 kg/m  Wt Readings from Last 3 Encounters:  03/17/23 121 lb 3.2 oz (55 kg)  03/15/23 121 lb 6.4 oz (55.1 kg)  01/14/23 120 lb (54.4 kg)    Physical Exam Vitals reviewed.  Constitutional:      General: She is not in acute distress.    Appearance: Normal appearance.  HENT:     Head: Normocephalic and atraumatic.     Right Ear: External ear normal.     Left Ear: External ear normal.  Eyes:     General: No scleral icterus.       Right eye: No discharge.        Left eye: No discharge.     Conjunctiva/sclera: Conjunctivae normal.  Neck:     Thyroid: No thyromegaly.  Cardiovascular:     Rate and Rhythm: Normal rate and regular rhythm.  Pulmonary:     Effort: No respiratory distress.     Breath sounds: Normal breath sounds. No wheezing.  Abdominal:     General: Bowel sounds are normal.     Palpations: Abdomen is soft.     Tenderness:  There is no abdominal tenderness.  Musculoskeletal:        General: No swelling or tenderness.     Cervical back: Neck supple. No tenderness.  Lymphadenopathy:     Cervical: No cervical adenopathy.  Skin:    Findings: No erythema or rash.  Neurological:     Mental Status: She is alert.  Psychiatric:        Mood and Affect: Mood normal.        Behavior: Behavior  normal.      Outpatient Encounter Medications as of 03/17/2023  Medication Sig   doxycycline (VIBRA-TABS) 100 MG tablet Take 1 tablet (100 mg total) by mouth 2 (two) times daily.   acetaminophen (TYLENOL) 500 MG tablet Take 500 mg by mouth every 6 (six) hours as needed.   amLODipine (NORVASC) 5 MG tablet Take 1 tablet (5 mg total) by mouth daily.   Ascorbic Acid (VITAMIN C) 1000 MG tablet Take 1,000 mg by mouth daily.   atorvastatin (LIPITOR) 40 MG tablet Take 1 tablet (40 mg total) by mouth daily.   cetirizine (ZYRTEC) 10 MG tablet Take 1 tablet (10 mg total) by mouth daily.   Cholecalciferol (VITAMIN D3) 25 MCG (1000 UT) CAPS Take 1 capsule by mouth daily.   CRANBERRY EXTRACT PO Take 1 capsule by mouth 2 (two) times daily.   D-Mannose 500 MG CAPS Take 500 mg by mouth in the morning and at bedtime.   hydrochlorothiazide (HYDRODIURIL) 12.5 MG tablet TAKE ONE TABLET DAILY.   losartan (COZAAR) 100 MG tablet Take 1 tablet (100 mg total) by mouth daily.   polyethylene glycol (MIRALAX / GLYCOLAX) 17 g packet Take 17 g by mouth every other day.   Probiotic Product (ALIGN PO) Take by mouth as needed.   triamcinolone (NASACORT) 55 MCG/ACT nasal inhaler Place 2 sprays into the nose as needed.   [DISCONTINUED] amLODipine (NORVASC) 5 MG tablet Take 1 tablet (5 mg total) by mouth daily.   [DISCONTINUED] atorvastatin (LIPITOR) 40 MG tablet Take 1 tablet (40 mg total) by mouth daily.   [DISCONTINUED] losartan (COZAAR) 100 MG tablet Take 1 tablet by mouth daily.   No facility-administered encounter medications on file as of 03/17/2023.      Lab Results  Component Value Date   WBC 6.0 04/07/2022   HGB 13.6 04/07/2022   HCT 40.0 04/07/2022   PLT 227.0 04/07/2022   GLUCOSE 88 01/14/2023   CHOL 153 01/14/2023   TRIG 110.0 01/14/2023   HDL 54.70 01/14/2023   LDLDIRECT 103.0 05/21/2015   LDLCALC 77 01/14/2023   ALT 16 01/14/2023   AST 16 01/14/2023   NA 142 01/14/2023   K 3.8 01/14/2023   CL 105 01/14/2023   CREATININE 0.69 01/14/2023   BUN 17 01/14/2023   CO2 29 01/14/2023   TSH 1.75 01/14/2023   INR 1.0 03/17/2022    Ultrasound renal complete  Result Date: 06/26/2022 CLINICAL DATA:  Recurrent UTI EXAM: RENAL / URINARY TRACT ULTRASOUND COMPLETE COMPARISON:  Renal ultrasound 06/06/2021 FINDINGS: Right Kidney: Renal measurements: 9.1 x 3.9 x 4.1 cm = volume: 76 mL. Echogenicity within normal limits. No mass or hydronephrosis visualized. There is a shadowing calculus measuring 0.5 cm. Left Kidney: Renal measurements: 9.6 x 4.4 x 4.2 cm = volume: 95 mL. Echogenicity within normal limits. No hydronephrosis. There are simple cysts in the superior pole measuring 2.7 cm and in the midpole measuring 1.2 cm. Bladder: Appears normal for degree of bladder distention. Other: None. IMPRESSION: 1.  Nonobstructing right renal calculus. 2.  Benign cysts in the left kidney. Electronically Signed   By: Audie Pinto M.D.   On: 06/26/2022 12:21       Assessment & Plan:  Hypercholesterolemia Assessment & Plan: Continue lipitor.  Low cholesterol diet and exercise.  Follow lipid panel and liver function tests.   Orders: -     Lipid panel; Future -     Hepatic function panel; Future -     Basic metabolic panel; Future  Abnormal liver function tests Assessment & Plan: Follow liver function tests.     Angioleiomyoma Assessment & Plan: Saw urology 06/26/22 - recommended f/u ultrasound in 05/2023.    Primary hypertension Assessment & Plan: Continue amlodipine and hctz.  Follow pressures.  Follow metabolic panel.     Sinus  congestion Assessment & Plan: Increased sinus pressure and drainage. Increased cough and congestion.  Symptoms progressing. States feels c/w her previous sinus infections.  Continue saline nasal spray.  nasacort nasal spray.  Doxycycline as directed.  Continue probiotic.  Follow closely.  Call with update.     Stress Assessment & Plan: Appears to be handling things relatively well.  Follow.     Other orders -     amLODIPine Besylate; Take 1 tablet (5 mg total) by mouth daily.  Dispense: 90 tablet; Refill: 2 -     Atorvastatin Calcium; Take 1 tablet (40 mg total) by mouth daily.  Dispense: 90 tablet; Refill: 3 -     Losartan Potassium; Take 1 tablet (100 mg total) by mouth daily.  Dispense: 90 tablet; Refill: 2 -     Doxycycline Hyclate; Take 1 tablet (100 mg total) by mouth 2 (two) times daily.  Dispense: 20 tablet; Refill: 0     Einar Pheasant, MD

## 2023-03-17 NOTE — Progress Notes (Signed)
Jamie Elliott had a new patient appointment to see Dr. Jacinto Reap today.   He was not going to be in the office due to a delayed flight, she has been rescheduled to Monday at 2:00.   Appt. Details given.

## 2023-03-17 NOTE — Patient Instructions (Signed)
Robitussin DM - take twice a day for cough and congestion.   Nasacort nasal spray - 2 sprays each nostril one time per day.  Do this in the evening.    Saline nasal spray - flush nose 1-2x/day  Continue the probiotic.

## 2023-03-18 ENCOUNTER — Telehealth: Payer: Self-pay | Admitting: Internal Medicine

## 2023-03-18 MED ORDER — CEFDINIR 300 MG PO CAPS: 300.0000 mg | ORAL_CAPSULE | Freq: Two times a day (BID) | ORAL | 0 refills | Status: DC

## 2023-03-18 NOTE — Telephone Encounter (Signed)
LMTCB

## 2023-03-18 NOTE — Addendum Note (Signed)
Addended by: Roetta Sessions D on: 03/18/2023 04:22 PM   Modules accepted: Orders

## 2023-03-18 NOTE — Telephone Encounter (Signed)
Patient stated she does not remember ever taking omnicef but she will try it. Does not recall having issues with keflex or ceftin. Omnicef sent in to [facility]. Pt will stop taking if side effects.

## 2023-03-18 NOTE — Telephone Encounter (Signed)
She has taken doxycycline multiple times previously and tolerated.  We will stop the doxycycline. If symptoms continue, needs to be evaluated.  Also she had a documented allergy to augmentin.  Per review of medications, she has taken keflex and ceftin previously.  Please confirm no allergy to keflex or ceftin.  If no, then can send in omnicef 300mg  bid x 1 week.

## 2023-03-18 NOTE — Telephone Encounter (Signed)
Patient called in stating that she cannot take the doxycycline that was given to her. Everytime she takes a pill she gets nauseous and hot (flushed feeling) until she vomits then she is ok. Has tried with and without food. She is wondering if there is another abx she can take.

## 2023-03-18 NOTE — Telephone Encounter (Signed)
Patient called, her antiembolic that was prescribed yesterday is making her sick to her stomach.

## 2023-03-21 ENCOUNTER — Encounter: Payer: Self-pay | Admitting: Internal Medicine

## 2023-03-21 NOTE — Assessment & Plan Note (Signed)
Continue lipitor.  Low cholesterol diet and exercise.  Follow lipid panel and liver function tests.   

## 2023-03-21 NOTE — Assessment & Plan Note (Signed)
Follow liver function tests.   

## 2023-03-21 NOTE — Assessment & Plan Note (Signed)
Increased sinus pressure and drainage. Increased cough and congestion.  Symptoms progressing. States feels c/w her previous sinus infections.  Continue saline nasal spray.  nasacort nasal spray.  Doxycycline as directed.  Continue probiotic.  Follow closely.  Call with update.

## 2023-03-21 NOTE — Assessment & Plan Note (Signed)
Saw urology 06/26/22 - recommended f/u ultrasound in 05/2023.  

## 2023-03-21 NOTE — Assessment & Plan Note (Signed)
Appears to be handling things relatively well.  Follow.  

## 2023-03-21 NOTE — Assessment & Plan Note (Signed)
Continue amlodipine and hctz.  Follow pressures.  Follow metabolic panel.  

## 2023-03-22 ENCOUNTER — Encounter: Payer: Self-pay | Admitting: *Deleted

## 2023-03-22 ENCOUNTER — Inpatient Hospital Stay: Payer: BC Managed Care – PPO | Attending: Internal Medicine | Admitting: Internal Medicine

## 2023-03-22 ENCOUNTER — Encounter: Payer: Self-pay | Admitting: Internal Medicine

## 2023-03-22 ENCOUNTER — Inpatient Hospital Stay: Payer: BC Managed Care – PPO

## 2023-03-22 VITALS — BP 119/56 | HR 86 | Temp 97.2°F | Resp 17 | Ht 63.0 in | Wt 122.0 lb

## 2023-03-22 DIAGNOSIS — Z801 Family history of malignant neoplasm of trachea, bronchus and lung: Secondary | ICD-10-CM | POA: Insufficient documentation

## 2023-03-22 DIAGNOSIS — Z170 Estrogen receptor positive status [ER+]: Secondary | ICD-10-CM | POA: Diagnosis not present

## 2023-03-22 DIAGNOSIS — C50412 Malignant neoplasm of upper-outer quadrant of left female breast: Secondary | ICD-10-CM

## 2023-03-22 DIAGNOSIS — Z803 Family history of malignant neoplasm of breast: Secondary | ICD-10-CM | POA: Diagnosis not present

## 2023-03-22 NOTE — Assessment & Plan Note (Addendum)
#   Invasive mammary carcinoma-lobular features lobular ER/PR positive HER2 negative.  Clinical stage I [pT1b- 0.8 cm x 0.6 cm x 0.5 cm ]# I had a long discussion with the patient in general regarding the treatment options of breast cancer including-surgery; adjuvant radiation; role of adjuvant systemic therapy including-chemotherapy antihormone therapy.    # Discussed surgical options-sentinel lymph node biopsy with lumpectomy versus mastectomy.  Discussed that lumpectomy is usually followed by adjuvant radiation.  Whereas mastectomy typically does not indicate radiation.  However given the small size of the primary tumor-lumpectomy is most reasonable.  Given lobular features?  Defer to surgery for consideration of MRI.  Awaiting surgical evaluation on 4/03.  Also await final pathology to review recommendations regarding postlumpectomy radiation.  #Discussed that given patient's presurgical clinical characteristics less likely that she will need chemotherapy.  However final decision regarding chemotherapy based on final surgical pathology.   #I discussed the role of endocrine therapy-given ER/PR positive disease postsurgery.  Patient will be offered antihormone pill 1 a day for 5 years.  Discussed potential downsides including but not limited to arthralgias hot flashes and osteoporosis.  Would recommend a baseline bone density test prior to starting treatment.  # Genetic counseling: Given personal history of breast cancer; and family history-we will recommend genetic counseling.  We will make a referral at next visit.   Thank you Dr. Nicki Reaper for allowing me to participate in the care of your pleasant patient. Please do not hesitate to contact me with questions or concerns in the interim.  Discussed with Golda Acre.  # DISPOSITION: # no labs-  # follow up TBD- Dr.B

## 2023-03-22 NOTE — Progress Notes (Signed)
Met with patient and family at initial medical oncology appointment.   Reviewed Breast Cancer treatment handbook.   Care plan summary given to patient.   Reviewed outreach programs and cancer center services.

## 2023-03-22 NOTE — Progress Notes (Signed)
[facility] [place] NOTE  Patient Care Team: Einar Pheasant, MD as PCP - General (Internal Medicine) Daiva Huge, RN as Oncology Nurse Navigator  CHIEF COMPLAINTS/PURPOSE OF CONSULTATION: Breast cancer  #  Oncology History Overview Note   [facility]- MARCH 2024-  There is a subtle 0.8 cm x 0.6 cm x 0.5 cm  irregularly shaped, non-parallel, hypoechoic mass with indistinct margins  with shadowing seen in the upper outer quadrant of the left breast at 2  o'clock in the posterior depth, 9 cm from the nipple. The mass correlates  with the architectural distortion seen on the mammogram. March 2024- Left breast architectural distortion at 2:00, cylinder clip, stereotactic needle core biopsy:   Invasive adenocarcinoma of the breast (7 mm on core). Histologic type: Ductal with lobular features Provisional Nottingham combined histologic grade: 2 of 3 Tubule formation score: 3 Nuclear pleomorphism score: 2 Mitotic rate score:  1   Ductal carcinoma in situ (DCIS): Present Type of in-situ carcinoma: Solid and cribriform Nuclear grade of in-situ carcinoma: 2    Carcinoma of upper-outer quadrant of left breast in female, estrogen receptor positive  03/22/2023 Initial Diagnosis   Carcinoma of upper-outer quadrant of left breast in female, estrogen receptor positive   03/22/2023 Cancer Staging   Staging form: Breast, AJCC 8th Edition - Clinical: Stage IA (cT1b, cN0, cM0, G2, ER+, PR+, HER2-) - Signed by Cammie Sickle, MD on 03/22/2023 Histologic grading system: 3 grade system     HISTORY OF PRESENTING ILLNESS: Patient ambulating-independently.Accompanied by family- daughter/husband.  Jamie Elliott 77 y.o.  female female with no prior history of breast cancer/or malignancies has been referred to [place] for further evaluation recommendations for new diagnosis of breast cancer.   Patient states she was found to have an abnormal screening mammogram which led to diagnostic  mammogram/ultrasound/followed by biopsy-as summarized above.  Patient had her biopsy through [facility]; and is currently awaiting evaluation with surgery at [facility] in 2 days from now.  Family history of breast cancer: grandma- breast cancer in 90s; mo/sister-aunt- ? Breast cancer Family history of other cancers: fathers/smoker- Lung cancer  Previous biopsy:  none.   Review of Systems  Constitutional:  Negative for chills, diaphoresis, fever, malaise/fatigue and weight loss.  HENT:  Negative for nosebleeds and sore throat.   Eyes:  Negative for double vision.  Respiratory:  Negative for cough, hemoptysis, sputum production, shortness of breath and wheezing.   Cardiovascular:  Negative for chest pain, palpitations, orthopnea and leg swelling.  Gastrointestinal:  Negative for abdominal pain, blood in stool, constipation, diarrhea, heartburn, melena, nausea and vomiting.  Genitourinary:  Negative for dysuria, frequency and urgency.  Musculoskeletal:  Negative for back pain and joint pain.  Skin: Negative.  Negative for itching and rash.  Neurological:  Negative for dizziness, tingling, focal weakness, weakness and headaches.  Endo/Heme/Allergies:  Does not bruise/bleed easily.  Psychiatric/Behavioral:  Negative for depression. The patient is not nervous/anxious and does not have insomnia.      MEDICAL HISTORY:  Past Medical History:  Diagnosis Date   Environmental allergies    Hypercholesterolemia    Hypertension    Migraines     SURGICAL HISTORY: Past Surgical History:  Procedure Laterality Date   ABDOMINAL HYSTERECTOMY  2000   APPENDECTOMY  2000   CATARACT EXTRACTION W/PHACO Left 04/23/2021   Procedure: CATARACT EXTRACTION PHACO AND INTRAOCULAR LENS PLACEMENT (IOC) LEFT panoptix toric 10.41 01:22.8 12.6%;  Surgeon: Leandrew Koyanagi, MD;  Location: [place];  Service: Ophthalmology;  Laterality: Left;   CATARACT EXTRACTION W/PHACO Right 05/07/2021   Procedure: CATARACT  EXTRACTION PHACO AND INTRAOCULAR LENS PLACEMENT (IOC) RIGHT  panoptix 12.85 01:33.8 13.7%;  Surgeon: Leandrew Koyanagi, MD;  Location: [place];  Service: Ophthalmology;  Laterality: Right;   NOSE SURGERY  1967   SEPTOPLASTY     SHOULDER ARTHROSCOPY WITH OPEN ROTATOR CUFF REPAIR Right 12/05/2015   Procedure: SHOULDER ARTHROSCOPY WITH MINI OPEN ROTATOR CUFF REPAIR. DISTAL CLAVICLE EXCISION;  Surgeon: Thornton Park, MD;  Location: [facility];  Service: Orthopedics;  Laterality: Right;    SOCIAL HISTORY: Social History   Socioeconomic History   Marital status: Married    Spouse name: Not on file   Number of children: 2   Years of education: Not on file   Highest education level: Bachelor's degree (e.g., BA, AB, BS)  Occupational History   Not on file  Tobacco Use   Smoking status: Never   Smokeless tobacco: Never  Vaping Use   Vaping Use: Never used  Substance and Sexual Activity   Alcohol use: No    Alcohol/week: 0.0 standard drinks of alcohol   Drug use: No   Sexual activity: Not Currently  Other Topics Concern   Not on file  Social History Narrative   Not on file   Social Determinants of Health   Financial Resource Strain: Low Risk  (03/15/2023)   Overall Financial Resource Strain (CARDIA)    Difficulty of Paying Living Expenses: Not hard at all  Food Insecurity: No Food Insecurity (03/15/2023)   Hunger Vital Sign    Worried About Running Out of Food in the Last Year: Never true    Ran Out of Food in the Last Year: Never true  Transportation Needs: No Transportation Needs (03/15/2023)   PRAPARE - Hydrologist (Medical): No    Lack of Transportation (Non-Medical): No  Physical Activity: Sufficiently Active (03/15/2023)   Exercise Vital Sign    Days of Exercise per Week: 5 days    Minutes of Exercise per Session: 40 min  Stress: No Stress Concern Present (03/15/2023)   [place]    Feeling of Stress : Not at all  Social Connections: [place] (03/15/2023)   Social Connection and Isolation Panel [NHANES]    Frequency of Communication with Friends and Family: More than three times a week    Frequency of Social Gatherings with Friends and Family: Twice a week    Attends Religious Services: More than 4 times per year    Active Member of [employer] or Organizations: Yes    Attends Music therapist: More than 4 times per year    Marital Status: Married  Human resources officer Violence: Not At Risk (01/04/2023)   Humiliation, Afraid, Rape, and Kick questionnaire    Fear of Current or Ex-Partner: No    Emotionally Abused: No    Physically Abused: No    Sexually Abused: No    FAMILY HISTORY: Family History  Problem Relation Age of Onset   Heart disease Mother    Hyperlipidemia Mother    Hypertension Mother    Cancer Father        lung, prostate   Hyperlipidemia Father    Hypertension Father    Prostate cancer Father    Bladder Cancer Neg Hx    Kidney cancer Neg Hx     ALLERGIES:  is allergic to ciprofloxacin, zithromax [azithromycin], augmentin [amoxicillin-pot clavulanate], and doxycycline.  MEDICATIONS:  Current Outpatient Medications  Medication Sig Dispense Refill   acetaminophen (TYLENOL) 500 MG tablet Take 500 mg by mouth every 6 (six) hours as needed.     amLODipine (NORVASC) 5 MG tablet Take 1 tablet (5 mg total) by mouth daily. 90 tablet 2   Ascorbic Acid (VITAMIN C) 1000 MG tablet Take 1,000 mg by mouth daily.     atorvastatin (LIPITOR) 40 MG tablet Take 1 tablet (40 mg total) by mouth daily. 90 tablet 3   cefdinir (OMNICEF) 300 MG capsule Take 1 capsule (300 mg total) by mouth 2 (two) times daily. 14 capsule 0   cetirizine (ZYRTEC) 10 MG tablet Take 1 tablet (10 mg total) by mouth daily. 30 tablet 0   Cholecalciferol (VITAMIN D3) 25 MCG (1000 UT) CAPS Take 1 capsule by mouth daily.     CRANBERRY EXTRACT PO Take 1 capsule  by mouth 2 (two) times daily.     D-Mannose 500 MG CAPS Take 500 mg by mouth in the morning and at bedtime.     hydrochlorothiazide (HYDRODIURIL) 12.5 MG tablet TAKE ONE TABLET DAILY. 90 tablet 0   losartan (COZAAR) 100 MG tablet Take 1 tablet (100 mg total) by mouth daily. 90 tablet 2   polyethylene glycol (MIRALAX / GLYCOLAX) 17 g packet Take 17 g by mouth every other day.     Probiotic Product (ALIGN PO) Take by mouth as needed.     triamcinolone (NASACORT) 55 MCG/ACT nasal inhaler Place 2 sprays into the nose as needed. 1 Inhaler 5   doxycycline (VIBRA-TABS) 100 MG tablet Take 1 tablet (100 mg total) by mouth 2 (two) times daily. (Patient not taking: Reported on 03/22/2023) 20 tablet 0   No current facility-administered medications for this visit.     PHYSICAL EXAMINATION:   Vitals:   03/22/23 1358  BP: (!) 119/56  Pulse: 86  Resp: 17  Temp: (!) 97.2 F (36.2 C)  SpO2: 97%   Filed Weights   03/22/23 1358  Weight: 122 lb (55.3 kg)    Physical Exam Vitals and nursing note reviewed.  HENT:     Head: Normocephalic and atraumatic.     Mouth/Throat:     Pharynx: Oropharynx is clear.  Eyes:     Extraocular Movements: Extraocular movements intact.     Pupils: Pupils are equal, round, and reactive to light.  Cardiovascular:     Rate and Rhythm: Normal rate and regular rhythm.  Pulmonary:     Comments: Decreased breath sounds bilaterally.  Abdominal:     Palpations: Abdomen is soft.  Musculoskeletal:        General: Normal range of motion.     Cervical back: Normal range of motion.  Skin:    General: Skin is warm.  Neurological:     General: No focal deficit present.     Mental Status: She is alert and oriented to person, place, and time.  Psychiatric:        Behavior: Behavior normal.        Judgment: Judgment normal.      LABORATORY DATA:  I have reviewed the data as listed Lab Results  Component Value Date   WBC 6.0 04/07/2022   HGB 13.6 04/07/2022   HCT  40.0 04/07/2022   MCV 91.7 04/07/2022   PLT 227.0 04/07/2022   Recent Labs    06/12/22 0846 10/16/22 0800 01/14/23 0815  NA 142 141 142  K 3.9 3.9 3.8  CL 105 104 105  CO2  32 29 29  GLUCOSE 89 80 88  BUN 18 18 17   CREATININE 0.76 0.73 0.69  CALCIUM 10.3 10.0 9.8  PROT 6.0 6.1 6.1  ALBUMIN 3.9 4.0 4.0  AST 16 15 16   ALT 17 18 16   ALKPHOS 83 85 83  BILITOT 0.5 0.6 0.5  BILIDIR 0.1 0.1 0.1    RADIOGRAPHIC STUDIES: I have personally reviewed the radiological images as listed and agreed with the findings in the report. No results found.  ASSESSMENT & PLAN:   Carcinoma of upper-outer quadrant of left breast in female, estrogen receptor positive # Invasive mammary carcinoma-lobular features lobular ER/PR positive HER2 negative.  Clinical stage I [pT1b- 0.8 cm x 0.6 cm x 0.5 cm ]# I had a long discussion with the patient in general regarding the treatment options of breast cancer including-surgery; adjuvant radiation; role of adjuvant systemic therapy including-chemotherapy antihormone therapy.    # Discussed surgical options-sentinel lymph node biopsy with lumpectomy versus mastectomy.  Discussed that lumpectomy is usually followed by adjuvant radiation.  Whereas mastectomy typically does not indicate radiation.  However given the small size of the primary tumor-lumpectomy is most reasonable.  Given lobular features?  Defer to surgery for consideration of MRI.  Awaiting surgical evaluation on 4/03.  Also await final pathology to review recommendations regarding postlumpectomy radiation.  #Discussed that given patient's presurgical clinical characteristics less likely that she will need chemotherapy.  However final decision regarding chemotherapy based on final surgical pathology.   #I discussed the role of endocrine therapy-given ER/PR positive disease postsurgery.  Patient will be offered antihormone pill 1 a day for 5 years.  Discussed potential downsides including but not limited  to arthralgias hot flashes and osteoporosis.  Would recommend a baseline bone density test prior to starting treatment.  # Genetic counseling: Given personal history of breast cancer; and family history-we will recommend genetic counseling.  We will make a referral at next visit.   Thank you Dr. Nicki Reaper for allowing me to participate in the care of your pleasant patient. Please do not hesitate to contact me with questions or concerns in the interim.  Discussed with Golda Acre.  # DISPOSITION: # no labs-  # follow up TBD- Dr.B  All questions were answered. The patient/family knows to call the clinic with any problems, questions or concerns.    Cammie Sickle, MD 03/22/2023 3:55 PM

## 2023-03-24 DIAGNOSIS — Z170 Estrogen receptor positive status [ER+]: Secondary | ICD-10-CM | POA: Diagnosis not present

## 2023-03-24 DIAGNOSIS — M810 Age-related osteoporosis without current pathological fracture: Secondary | ICD-10-CM | POA: Diagnosis not present

## 2023-03-24 DIAGNOSIS — C50412 Malignant neoplasm of upper-outer quadrant of left female breast: Secondary | ICD-10-CM | POA: Diagnosis not present

## 2023-03-29 ENCOUNTER — Encounter: Payer: Self-pay | Admitting: *Deleted

## 2023-03-29 DIAGNOSIS — Z170 Estrogen receptor positive status [ER+]: Secondary | ICD-10-CM

## 2023-03-29 NOTE — Progress Notes (Signed)
Surgery at [facility] is scheduled for 04/22/23.  She will see Dr. Leonard Schwartz and Dr. Rushie Chestnut on 5/9.  Appt. Details given to her.

## 2023-04-07 ENCOUNTER — Inpatient Hospital Stay (HOSPITAL_BASED_OUTPATIENT_CLINIC_OR_DEPARTMENT_OTHER): Payer: BC Managed Care – PPO | Admitting: Hospice and Palliative Medicine

## 2023-04-07 DIAGNOSIS — C50412 Malignant neoplasm of upper-outer quadrant of left female breast: Secondary | ICD-10-CM

## 2023-04-07 DIAGNOSIS — Z170 Estrogen receptor positive status [ER+]: Secondary | ICD-10-CM

## 2023-04-07 NOTE — Progress Notes (Signed)
Multidisciplinary Oncology Council Documentation  Jamie Elliott was presented by our [facility] on 04/07/2023, which included representatives from:  Palliative Care Dietitian  Physical/Occupational Therapist Nurse Navigator Genetics Speech Therapist Social work Survivorship RN Financial Navigator Research RN   Jamie Elliott currently presents with history of breast cancer  We reviewed previous medical and familial history, history of present illness, and recent lab results along with all available histopathologic and imaging studies. The MOC considered available treatment options and made the following recommendations/referrals:  Genetics, SW, rehab screening  The MOC is a meeting of clinicians from various specialty areas who evaluate and discuss patients for whom a multidisciplinary approach is being considered. Final determinations in the plan of care are those of the provider(s).   Today's extended care, comprehensive team conference, Jamie Elliott was not present for the discussion and was not examined.

## 2023-04-14 ENCOUNTER — Inpatient Hospital Stay: Payer: BC Managed Care – PPO | Admitting: Occupational Therapy

## 2023-04-14 DIAGNOSIS — Z170 Estrogen receptor positive status [ER+]: Secondary | ICD-10-CM

## 2023-04-14 NOTE — Therapy (Signed)
[facility] at [facility] [address], [address] [place], [place], [address] Phone: [phone]   Fax:  [phone]  Occupational Therapy Screen  Patient Details  Name: Jamie Elliott MRN: [id number] Date of Birth: 06-30-1946 No data recorded  Encounter Date: 04/14/2023   OT End of Session - 04/14/23 1218     Visit Number 0             Past Medical History:  Diagnosis Date   Environmental allergies    Hypercholesterolemia    Hypertension    Migraines     Past Surgical History:  Procedure Laterality Date   ABDOMINAL HYSTERECTOMY  2000   APPENDECTOMY  2000   CATARACT EXTRACTION W/PHACO Left 04/23/2021   Procedure: CATARACT EXTRACTION PHACO AND INTRAOCULAR LENS PLACEMENT (IOC) LEFT panoptix toric 10.41 01:22.8 12.6%;  Surgeon: Lockie Mola, MD;  Location: [facility];  Service: Ophthalmology;  Laterality: Left;   CATARACT EXTRACTION W/PHACO Right 05/07/2021   Procedure: CATARACT EXTRACTION PHACO AND INTRAOCULAR LENS PLACEMENT (IOC) RIGHT  panoptix 12.85 01:33.8 13.7%;  Surgeon: Lockie Mola, MD;  Location: [facility];  Service: Ophthalmology;  Laterality: Right;   NOSE SURGERY  1967   SEPTOPLASTY     SHOULDER ARTHROSCOPY WITH OPEN ROTATOR CUFF REPAIR Right 12/05/2015   Procedure: SHOULDER ARTHROSCOPY WITH MINI OPEN ROTATOR CUFF REPAIR. DISTAL CLAVICLE EXCISION;  Surgeon: Juanell Fairly, MD;  Location: [facility];  Service: Orthopedics;  Laterality: Right;    There were no vitals filed for this visit.   Subjective Assessment - 04/14/23 1217     Subjective  I am having surgery 04/22/23 at [facility]- Lumpectomy -had Rotator cuff surgery years ago on the R    Currently in Pain? No/denies                 LYMPHEDEMA/ONCOLOGY QUESTIONNAIRE - 04/14/23 0001       Right Upper Extremity Lymphedema   15 cm Proximal to Olecranon Process 24.5 cm    10 cm Proximal to Olecranon Process 23 cm    Olecranon  Process 20.5 cm      Left Upper Extremity Lymphedema   15 cm Proximal to Olecranon Process 25 cm    10 cm Proximal to Olecranon Process 23.5 cm    Olecranon Process 21.5 cm             ASSESSMENT & PLAN:  03/22/23 DR Donneta Romberg    Carcinoma of upper-outer quadrant of left breast in female, estrogen receptor positive # Invasive mammary carcinoma-lobular features lobular ER/PR positive HER2 negative.  Clinical stage I [pT1b- 0.8 cm x 0.6 cm x 0.5 cm ]# I had a long discussion with the patient in general regarding the treatment options of breast cancer including-surgery; adjuvant radiation; role of adjuvant systemic therapy including-chemotherapy antihormone therapy.     # Discussed surgical options-sentinel lymph node biopsy with lumpectomy versus mastectomy.  Discussed that lumpectomy is usually followed by adjuvant radiation.  Whereas mastectomy typically does not indicate radiation.  However given the small size of the primary tumor-lumpectomy is most reasonable.  Given lobular features?  Defer to surgery for consideration of MRI.  Awaiting surgical evaluation on 4/03.  Also await final pathology to review recommendations regarding postlumpectomy radiation.   #Discussed that given patient's presurgical clinical characteristics less likely that she will need chemotherapy.  However final decision regarding chemotherapy based on final surgical pathology.    #I discussed the role of endocrine therapy-given ER/PR positive disease postsurgery.  Patient will be offered antihormone pill 1 a day for 5 years.  Discussed potential downsides including but not limited to arthralgias hot flashes and osteoporosis.  Would recommend a baseline bone density test prior to starting treatment.   # Genetic counseling: Given personal history of breast cancer; and family history-we will recommend genetic counseling.  We will make a referral at next visit.      OT SCREEN 04/14/23: Patient presented at OT  screen-scheduled for lumpectomy at [facility] 04/22/2023 left breast. Patient reports she is mostly left-handed but doing a lot of the right hand. Patient had rotator cuff surgery on the right. Patient's active range of motion and strength within functional limits No pain Reviewed with patient's home exercises after surgery in supine for shoulder flexion and abduction if okayed by surgeon. Circumference was measured within normal limits  - at baseline Patient to follow-up with me as needed prior /during radiation.                         Visit Diagnosis: Carcinoma of upper-outer quadrant of left breast in female, estrogen receptor positive    Problem List Patient Active Problem List   Diagnosis Date Noted   Carcinoma of upper-outer quadrant of left breast in female, estrogen receptor positive 03/22/2023   Viral URI 03/15/2023   Headache 01/14/2023   Fall 08/23/2022   Glossitis 05/02/2022   Aphthous ulcer 04/11/2022   Leukocytosis 04/07/2022   Viral syndrome 03/28/2022   Respiratory tract infection 03/17/2022   Nausea and vomiting 03/17/2022   Vitamin D deficiency 12/11/2021   Mouth sores 10/19/2021   Exposure to COVID-19 virus 08/24/2021   Abnormal mammogram 06/26/2021   History of COVID-19 06/18/2021   Bleeding 03/15/2021   Left foot pain 03/04/2021   Fatigue 01/05/2021   Angioleiomyoma 11/18/2020   Nasal drainage 08/12/2020   Stress 04/22/2020   Sinus congestion 11/08/2019   Fever 09/17/2019   Allergy 07/02/2019   Low back pain 12/11/2018   Left arm pain 07/24/2018   Right hip pain 05/24/2018   Left leg swelling 09/03/2017   Chest pressure 06/16/2017   Altered sensation of foot 06/16/2017   Recurrent UTI 05/12/2017   Constipation 02/16/2016   UTI (urinary tract infection) 01/06/2016   Pain in joint, shoulder region 12/05/2015   Loss of weight 11/25/2015   History of abnormal mammogram 08/26/2015   Health care maintenance 05/19/2015   Osteoporosis  04/17/2014   Abnormal liver function tests 12/17/2013   HTN (hypertension) 12/04/2012   Hypercholesterolemia 12/04/2012   Environmental allergies 12/04/2012    Oletta Cohn, OTR/L,CLT 04/14/2023, 12:19 PM  [facility] [address], [address] [place], [place], [address] Phone: [phone]   Fax:  [phone]  Name: Jamie Elliott MRN: [id number] Date of Birth: 1946/03/11

## 2023-04-21 DIAGNOSIS — Z170 Estrogen receptor positive status [ER+]: Secondary | ICD-10-CM | POA: Diagnosis not present

## 2023-04-21 DIAGNOSIS — R928 Other abnormal and inconclusive findings on diagnostic imaging of breast: Secondary | ICD-10-CM | POA: Diagnosis not present

## 2023-04-21 DIAGNOSIS — C50412 Malignant neoplasm of upper-outer quadrant of left female breast: Secondary | ICD-10-CM | POA: Diagnosis not present

## 2023-04-22 DIAGNOSIS — N6092 Unspecified benign mammary dysplasia of left breast: Secondary | ICD-10-CM | POA: Diagnosis not present

## 2023-04-22 DIAGNOSIS — Z170 Estrogen receptor positive status [ER+]: Secondary | ICD-10-CM | POA: Diagnosis not present

## 2023-04-22 DIAGNOSIS — I10 Essential (primary) hypertension: Secondary | ICD-10-CM | POA: Diagnosis not present

## 2023-04-22 DIAGNOSIS — Z9049 Acquired absence of other specified parts of digestive tract: Secondary | ICD-10-CM | POA: Diagnosis not present

## 2023-04-22 DIAGNOSIS — N6012 Diffuse cystic mastopathy of left breast: Secondary | ICD-10-CM | POA: Diagnosis not present

## 2023-04-22 DIAGNOSIS — R921 Mammographic calcification found on diagnostic imaging of breast: Secondary | ICD-10-CM | POA: Diagnosis not present

## 2023-04-22 DIAGNOSIS — C50412 Malignant neoplasm of upper-outer quadrant of left female breast: Secondary | ICD-10-CM | POA: Diagnosis not present

## 2023-04-22 DIAGNOSIS — Z803 Family history of malignant neoplasm of breast: Secondary | ICD-10-CM | POA: Diagnosis not present

## 2023-04-22 DIAGNOSIS — Z79899 Other long term (current) drug therapy: Secondary | ICD-10-CM | POA: Diagnosis not present

## 2023-04-22 DIAGNOSIS — Z881 Allergy status to other antibiotic agents status: Secondary | ICD-10-CM | POA: Diagnosis not present

## 2023-04-22 DIAGNOSIS — N62 Hypertrophy of breast: Secondary | ICD-10-CM | POA: Diagnosis not present

## 2023-04-26 ENCOUNTER — Inpatient Hospital Stay: Payer: Medicare Other | Attending: Internal Medicine

## 2023-04-26 DIAGNOSIS — C50412 Malignant neoplasm of upper-outer quadrant of left female breast: Secondary | ICD-10-CM | POA: Insufficient documentation

## 2023-04-26 DIAGNOSIS — Z170 Estrogen receptor positive status [ER+]: Secondary | ICD-10-CM | POA: Insufficient documentation

## 2023-04-26 DIAGNOSIS — Z801 Family history of malignant neoplasm of trachea, bronchus and lung: Secondary | ICD-10-CM | POA: Insufficient documentation

## 2023-04-26 NOTE — Progress Notes (Signed)
[facility] Clinical Social Work  Clinical Social Work was referred by medical provider for assessment of psychosocial needs.  Clinical Social Worker contacted patient by phone to offer support and assess for needs.    Patient stated she was doing well and had no questions or needs at this time.  Her husband and daughter are very supportive and plan on going to the genetic appointment this Thursday with her.  Encouraged her to contact CSW with any further questions or concerns.     Dorothey Baseman, LCSW  Clinical Social Worker [facility]

## 2023-04-28 ENCOUNTER — Encounter: Payer: Self-pay | Admitting: *Deleted

## 2023-04-28 NOTE — Progress Notes (Signed)
Pathology is not back yet from lumpectomy at [facility].  Follow up appointments with Dr. B and consult with Dr. Rushie Chestnut moved to 5/23.

## 2023-04-29 ENCOUNTER — Inpatient Hospital Stay: Payer: Medicare Other | Admitting: Internal Medicine

## 2023-04-29 ENCOUNTER — Ambulatory Visit: Payer: Medicare Other | Admitting: Radiation Oncology

## 2023-04-29 ENCOUNTER — Inpatient Hospital Stay: Payer: Medicare Other

## 2023-04-29 ENCOUNTER — Inpatient Hospital Stay: Payer: Medicare Other | Admitting: Licensed Clinical Social Worker

## 2023-05-03 ENCOUNTER — Ambulatory Visit (INDEPENDENT_AMBULATORY_CARE_PROVIDER_SITE_OTHER): Payer: Medicare Other | Admitting: Urology

## 2023-05-03 ENCOUNTER — Other Ambulatory Visit
Admission: RE | Admit: 2023-05-03 | Discharge: 2023-05-03 | Disposition: A | Discharge status: Home / Self Care | Payer: Medicare Other | Attending: Urology | Admitting: Urology

## 2023-05-03 ENCOUNTER — Other Ambulatory Visit: Payer: Self-pay | Admitting: Family Medicine

## 2023-05-03 ENCOUNTER — Encounter: Payer: Self-pay | Admitting: Urology

## 2023-05-03 VITALS — BP 110/63 | HR 77 | Ht 63.0 in | Wt 122.0 lb

## 2023-05-03 DIAGNOSIS — N390 Urinary tract infection, site not specified: Secondary | ICD-10-CM

## 2023-05-03 DIAGNOSIS — R82998 Other abnormal findings in urine: Secondary | ICD-10-CM

## 2023-05-03 DIAGNOSIS — N952 Postmenopausal atrophic vaginitis: Secondary | ICD-10-CM | POA: Diagnosis not present

## 2023-05-03 DIAGNOSIS — R1030 Lower abdominal pain, unspecified: Secondary | ICD-10-CM

## 2023-05-03 DIAGNOSIS — D1771 Benign lipomatous neoplasm of kidney: Secondary | ICD-10-CM | POA: Diagnosis not present

## 2023-05-03 DIAGNOSIS — R3915 Urgency of urination: Secondary | ICD-10-CM

## 2023-05-03 DIAGNOSIS — R300 Dysuria: Secondary | ICD-10-CM

## 2023-05-03 DIAGNOSIS — M545 Low back pain, unspecified: Secondary | ICD-10-CM

## 2023-05-03 DIAGNOSIS — Z87440 Personal history of urinary (tract) infections: Secondary | ICD-10-CM

## 2023-05-03 LAB — URINALYSIS, COMPLETE (UACMP) WITH MICROSCOPIC
Bilirubin Urine: NEGATIVE
Glucose, UA: NEGATIVE mg/dL
Ketones, ur: NEGATIVE mg/dL
Nitrite: POSITIVE — AB
Protein, ur: NEGATIVE mg/dL
Specific Gravity, Urine: 1.015 (ref 1.005–1.030)
pH: 6.5 (ref 5.0–8.0)

## 2023-05-03 MED ORDER — NITROFURANTOIN MONOHYD MACRO 100 MG PO CAPS
100.0000 mg | ORAL_CAPSULE | Freq: Two times a day (BID) | ORAL | 0 refills | Status: DC
Start: 2023-05-03 — End: 2023-05-13

## 2023-05-03 NOTE — Progress Notes (Signed)
05/03/2023 3:04 PM   Jamie Elliott Apr 20, 1946 [id number]  Referring provider: Dale [place], MD [address] [place],  [place] [address]  Urological history: 1. rUTI's -Risk factors: age, vaginal atrophy and constipation -KUB 12/2017 constipation.  Cystoscopy performed on 02/02/2018 by Dr. Apolinar Junes noted subtle trigonitis but no significant pathology.   -documented urine cultures over the last year  July 17, 2022 no growth  July 10, 2022 E. coli   2. Angiomyolipoma -RUS 11/13/2020 ~ 2 cm cyst in the left kidney and a ~ 1 cm hyperechoic lesion in the right likely an angiomyolipoma -RUS 05/2021 - Stable 0.8 cm hyperechoic structure within the right renal pelvis, which could reflect a nonshadowing calculus or small angiomyolipoma.  Simple left renal cyst. -contrast CT, 02/2022 - simple appearing bilateral renal cyst  3. Vaginal atrophy -cervix, uterus and ovaries removed in 2000.  No malignancies noted -abnormality found in left breast on 02/2021 mammogram/breast ultrasound-currently under surveillance   Chief Complaint  Patient presents with   Recurrent UTI    HPI: Jamie Elliott is a 77 y.o. female who presents today for possible UTI.    Sometime around Thursday or Friday, she had the sudden onset of lower abdominal pain, dysuria, urgency and lower back pain.  She also mentioned cloudy urine.    Patient denies any modifying or aggravating factors.  Patient denies any gross hematuria, dysuria or suprapubic/flank pain.  Patient denies any fevers, chills, nausea or vomiting.    UA yellow hazy, specific gravity 1.015, pH 6.5, trace hemoglobin, nitrate positive, small leukocytes, 0-5 squames, 21-50 WBCs, 0-5 RBCs and many bacteria.    Of note, she underwent a left lumpectomy on Apr 22, 2023.  She has an upcoming appointment to receive her results.  PMH: Past Medical History:  Diagnosis Date   Environmental allergies    Hypercholesterolemia     Hypertension    Migraines     Surgical History: Past Surgical History:  Procedure Laterality Date   ABDOMINAL HYSTERECTOMY  2000   APPENDECTOMY  2000   CATARACT EXTRACTION W/PHACO Left 04/23/2021   Procedure: CATARACT EXTRACTION PHACO AND INTRAOCULAR LENS PLACEMENT (IOC) LEFT panoptix toric 10.41 01:22.8 12.6%;  Surgeon: Lockie Mola, MD;  Location: [facility];  Service: Ophthalmology;  Laterality: Left;   CATARACT EXTRACTION W/PHACO Right 05/07/2021   Procedure: CATARACT EXTRACTION PHACO AND INTRAOCULAR LENS PLACEMENT (IOC) RIGHT  panoptix 12.85 01:33.8 13.7%;  Surgeon: Lockie Mola, MD;  Location: [facility];  Service: Ophthalmology;  Laterality: Right;   NOSE SURGERY  1967   SEPTOPLASTY     SHOULDER ARTHROSCOPY WITH OPEN ROTATOR CUFF REPAIR Right 12/05/2015   Procedure: SHOULDER ARTHROSCOPY WITH MINI OPEN ROTATOR CUFF REPAIR. DISTAL CLAVICLE EXCISION;  Surgeon: Juanell Fairly, MD;  Location: [facility];  Service: Orthopedics;  Laterality: Right;    Home Medications:  Current Outpatient Medications on File Prior to Visit  Medication Sig Dispense Refill   acetaminophen (TYLENOL) 500 MG tablet Take 500 mg by mouth every 6 (six) hours as needed.     amLODipine (NORVASC) 5 MG tablet Take 1 tablet (5 mg total) by mouth daily. 90 tablet 2   Ascorbic Acid (VITAMIN C) 1000 MG tablet Take 1,000 mg by mouth daily.     atorvastatin (LIPITOR) 40 MG tablet Take 1 tablet (40 mg total) by mouth daily. 90 tablet 3   cetirizine (ZYRTEC) 10 MG tablet Take 1 tablet (10 mg total) by mouth daily. 30 tablet 0  Cholecalciferol (VITAMIN D3) 25 MCG (1000 UT) CAPS Take 1 capsule by mouth daily.     CRANBERRY EXTRACT PO Take 1 capsule by mouth 2 (two) times daily.     D-Mannose 500 MG CAPS Take 500 mg by mouth in the morning and at bedtime.     hydrochlorothiazide (HYDRODIURIL) 12.5 MG tablet TAKE ONE TABLET DAILY. 90 tablet 0   losartan (COZAAR) 100 MG tablet Take 1 tablet (100  mg total) by mouth daily. 90 tablet 2   polyethylene glycol (MIRALAX / GLYCOLAX) 17 g packet Take 17 g by mouth every other day.     Probiotic Product (ALIGN PO) Take by mouth as needed.     triamcinolone (NASACORT) 55 MCG/ACT nasal inhaler Place 2 sprays into the nose as needed. 1 Inhaler 5   No current facility-administered medications on file prior to visit.    Allergies:  Allergies  Allergen Reactions   Ciprofloxacin Other (See Comments)    Patient reports "it doesn't work for me". Last culture was sensitive, however.    Zithromax [Azithromycin]    Augmentin [Amoxicillin-Pot Clavulanate] Rash   Doxycycline Nausea And Vomiting    Sensitive on her stomach    Family History: Family History  Problem Relation Age of Onset   Heart disease Mother    Hyperlipidemia Mother    Hypertension Mother    Cancer Father        lung, prostate   Hyperlipidemia Father    Hypertension Father    Prostate cancer Father    Bladder Cancer Neg Hx    Kidney cancer Neg Hx     Social History:  reports that she has never smoked. She has never used smokeless tobacco. She reports that she does not drink alcohol and does not use drugs.  ROS: Pertinent ROS in HPI  Physical Exam: BP 110/63   Pulse 77   Ht 5\' 3"  (1.6 m)   Wt 122 lb (55.3 kg)   LMP 11/28/1998   BMI 21.61 kg/m   Constitutional:  Well nourished. Alert and oriented, No acute distress. HEENT: [place] AT, moist mucus membranes.  Trachea midline Cardiovascular: No clubbing, cyanosis, or edema. Respiratory: Normal respiratory effort, no increased work of breathing. Neurologic: Grossly intact, no focal deficits, moving all 4 extremities. Psychiatric: Normal mood and affect.    Laboratory Data:    Latest Ref Rng & Units 01/14/2023    8:15 AM 10/16/2022    8:00 AM 06/12/2022    8:46 AM  BMP  Glucose 70 - 99 mg/dL 88  80  89   BUN 6 - 23 mg/dL 17  18  18    Creatinine 0.40 - 1.20 mg/dL 0.98  1.19  1.47   Sodium 135 - 145 mEq/L 142  141   142   Potassium 3.5 - 5.1 mEq/L 3.8  3.9  3.9   Chloride 96 - 112 mEq/L 105  104  105   CO2 19 - 32 mEq/L 29  29  32   Calcium 8.4 - 10.5 mg/dL 9.8  82.9  56.2   Urinalysis Component     Latest Ref Rng 05/03/2023  Color, Urine     YELLOW  YELLOW   Appearance     CLEAR  HAZY !   Specific Gravity, Urine     1.005 - 1.030  1.015   pH     5.0 - 8.0  6.5   Ketones, ur     NEGATIVE mg/dL NEGATIVE   Bilirubin Urine  NEGATIVE  NEGATIVE   Hgb urine dipstick     NEGATIVE  TRACE !   Nitrite     NEGATIVE  POSITIVE !   WBC, UA     0 - 5 WBC/hpf 21-50   RBC / HPF     0 - 5 RBC/hpf 0-5   Squamous Epithelial / HPF     0 - 5 /HPF 0-5   Glucose, UA     NEGATIVE mg/dL NEGATIVE   Bacteria, UA     NONE SEEN  MANY !   Protein     NEGATIVE mg/dL NEGATIVE   Leukocytes,Ua     NEGATIVE  SMALL !     Legend: ! Abnormal I have reviewed the labs.   Pertinent Imaging: N/A  Assessment & Plan:    1. rUTI's -symptomatic at today's visit -UA suspicious for infection -Urine sent for culture -Started on nitrofurantoin 100 mg twice daily empirically, will adjust if necessary once culture results are available  2. Vaginal atrophy -she just underwent a lumpectomy and has not yet received the results -If results are negative for malignancy, we could consider prescribing vaginal estrogen cream  3. Right renal angiomyolipoma - will obtain a RUS in 12 months (June 2024) to evaluate stability - ending surveillance in 2026  Return for keep follow up in July .  These notes generated with voice recognition software. I apologize for typographical errors.  Cloretta Ned  [facility] [address]  [address] [place], [place] [address] [phone]

## 2023-05-05 DIAGNOSIS — Z170 Estrogen receptor positive status [ER+]: Secondary | ICD-10-CM | POA: Diagnosis not present

## 2023-05-05 DIAGNOSIS — C50412 Malignant neoplasm of upper-outer quadrant of left female breast: Secondary | ICD-10-CM | POA: Diagnosis not present

## 2023-05-05 LAB — URINE CULTURE: Culture: >=100000 — AB

## 2023-05-06 ENCOUNTER — Ambulatory Visit
Admission: RE | Admit: 2023-05-06 | Discharge: 2023-05-06 | Disposition: A | Discharge status: Home / Self Care | Payer: Medicare Other | Source: Ambulatory Visit | Attending: Urology | Admitting: Urology

## 2023-05-06 DIAGNOSIS — N390 Urinary tract infection, site not specified: Secondary | ICD-10-CM | POA: Diagnosis not present

## 2023-05-06 DIAGNOSIS — N1330 Unspecified hydronephrosis: Secondary | ICD-10-CM | POA: Insufficient documentation

## 2023-05-06 DIAGNOSIS — D1771 Benign lipomatous neoplasm of kidney: Secondary | ICD-10-CM

## 2023-05-06 DIAGNOSIS — R39198 Other difficulties with micturition: Secondary | ICD-10-CM | POA: Insufficient documentation

## 2023-05-13 ENCOUNTER — Inpatient Hospital Stay: Payer: Medicare Other

## 2023-05-13 ENCOUNTER — Inpatient Hospital Stay (HOSPITAL_BASED_OUTPATIENT_CLINIC_OR_DEPARTMENT_OTHER): Payer: Medicare Other | Admitting: Internal Medicine

## 2023-05-13 ENCOUNTER — Encounter: Payer: Self-pay | Admitting: *Deleted

## 2023-05-13 ENCOUNTER — Encounter: Payer: Self-pay | Admitting: Internal Medicine

## 2023-05-13 ENCOUNTER — Telehealth: Payer: Self-pay | Admitting: *Deleted

## 2023-05-13 ENCOUNTER — Ambulatory Visit
Admission: RE | Admit: 2023-05-13 | Discharge: 2023-05-13 | Disposition: A | Discharge status: Home / Self Care | Payer: Medicare Other | Source: Ambulatory Visit | Attending: Radiation Oncology | Admitting: Radiation Oncology

## 2023-05-13 ENCOUNTER — Inpatient Hospital Stay: Payer: Medicare Other | Admitting: Licensed Clinical Social Worker

## 2023-05-13 VITALS — BP 110/61 | HR 82 | Temp 97.9°F | Resp 18 | Ht 63.0 in | Wt 127.4 lb

## 2023-05-13 VITALS — BP 110/61 | HR 82 | Temp 97.9°F | Resp 18 | Ht 63.0 in | Wt 125.2 lb

## 2023-05-13 DIAGNOSIS — Z8042 Family history of malignant neoplasm of prostate: Secondary | ICD-10-CM | POA: Insufficient documentation

## 2023-05-13 DIAGNOSIS — Z801 Family history of malignant neoplasm of trachea, bronchus and lung: Secondary | ICD-10-CM | POA: Diagnosis not present

## 2023-05-13 DIAGNOSIS — C50412 Malignant neoplasm of upper-outer quadrant of left female breast: Secondary | ICD-10-CM

## 2023-05-13 DIAGNOSIS — Z170 Estrogen receptor positive status [ER+]: Secondary | ICD-10-CM | POA: Diagnosis not present

## 2023-05-13 DIAGNOSIS — Z803 Family history of malignant neoplasm of breast: Secondary | ICD-10-CM | POA: Diagnosis not present

## 2023-05-13 DIAGNOSIS — Z79899 Other long term (current) drug therapy: Secondary | ICD-10-CM | POA: Insufficient documentation

## 2023-05-13 DIAGNOSIS — E7800 Pure hypercholesterolemia, unspecified: Secondary | ICD-10-CM | POA: Insufficient documentation

## 2023-05-13 DIAGNOSIS — I10 Essential (primary) hypertension: Secondary | ICD-10-CM | POA: Insufficient documentation

## 2023-05-13 NOTE — Progress Notes (Signed)
Pt is healing well from surgery. Unfortunately they have to go back in and retrieve more breast tissue next week. Appetite is good. Good ROM of her left arm. Denies pain.

## 2023-05-13 NOTE — Progress Notes (Signed)
Had appt with Dr. Aggie Cosier this morning.

## 2023-05-13 NOTE — Progress Notes (Deleted)
REFERRING PROVIDER: Earna Coder, MD [address] [place],  [place] [address]  PRIMARY PROVIDER:  Dale Coco, MD  PRIMARY REASON FOR VISIT:  1. Carcinoma of upper-outer quadrant of left breast in female, estrogen receptor positive (HCC)   2. Family history of breast cancer   3. Family history of prostate cancer      HISTORY OF PRESENT ILLNESS:   Jamie Elliott, a 77 y.o. female, was seen for a [place] cancer genetics consultation at the request of Dr. Donneta Romberg due to a personal and family history of cancer.  Jamie Elliott presents to clinic today to discuss the possibility of a hereditary predisposition to cancer, genetic testing, and to further clarify her future cancer risks, as well as potential cancer risks for family members.   In 2024, at the age of 15, Jamie Elliott was diagnosed with invasive mammary carcinoma of the left breast, ER/PR+ HER2-. The treatment plan includes lumpectomy (scheduled for 5/30). She meets with Dr. Donneta Romberg and Dr. Rushie Chestnut today as well.   CANCER HISTORY:  Oncology History Overview Note   [facility]- MARCH 2024-  There is a subtle 0.8 cm x 0.6 cm x 0.5 cm  irregularly shaped, non-parallel, hypoechoic mass with indistinct margins  with shadowing seen in the upper outer quadrant of the left breast at 2  o'clock in the posterior depth, 9 cm from the nipple. The mass correlates  with the architectural distortion seen on the mammogram. March 2024- Left breast architectural distortion at 2:00, cylinder clip, stereotactic needle core biopsy:   Invasive adenocarcinoma of the breast (7 mm on core). Histologic type: Ductal with lobular features Provisional [place] combined histologic grade: 2 of 3 Tubule formation score: 3 Nuclear pleomorphism score: 2 Mitotic rate score:  1   Ductal carcinoma in situ (DCIS): Present Type of in-situ carcinoma: Solid and cribriform Nuclear grade of in-situ carcinoma: 2    Carcinoma of upper-outer quadrant of  left breast in female, estrogen receptor positive (HCC)  03/22/2023 Initial Diagnosis   Carcinoma of upper-outer quadrant of left breast in female, estrogen receptor positive   03/22/2023 Cancer Staging   Staging form: Breast, AJCC 8th Edition - Clinical: Stage IA (cT1b, cN0, cM0, G2, ER+, PR+, HER2-) - Signed by Earna Coder, MD on 03/22/2023 Histologic grading system: 3 grade system     RISK FACTORS:  Menarche was at age ***.  First live birth at age ***.  OCP use for approximately {Numbers 1-12 multi-select:20307} years.  Ovaries intact: {Yes/No-Ex:120004}.  Hysterectomy: {Yes/No-Ex:120004}.  Menopausal status: {Menopause:31378}.  HRT use: {Numbers 1-12 multi-select:20307} years. Colonoscopy: {Yes/No-Ex:120004}; {normal/abnormal/not examined:14677}. Mammogram within the last year: {Yes/No-Ex:120004}. Number of breast biopsies: {Numbers 1-12 multi-select:20307}. Up to date with pelvic exams: {Yes/No-Ex:120004}. Any excessive radiation exposure in the past: {Yes/No-Ex:120004}  Past Medical History:  Diagnosis Date   Environmental allergies    Hypercholesterolemia    Hypertension    Migraines     Past Surgical History:  Procedure Laterality Date   ABDOMINAL HYSTERECTOMY  2000   APPENDECTOMY  2000   CATARACT EXTRACTION W/PHACO Left 04/23/2021   Procedure: CATARACT EXTRACTION PHACO AND INTRAOCULAR LENS PLACEMENT (IOC) LEFT panoptix toric 10.41 01:22.8 12.6%;  Surgeon: Lockie Mola, MD;  Location: [facility];  Service: Ophthalmology;  Laterality: Left;   CATARACT EXTRACTION W/PHACO Right 05/07/2021   Procedure: CATARACT EXTRACTION PHACO AND INTRAOCULAR LENS PLACEMENT (IOC) RIGHT  panoptix 12.85 01:33.8 13.7%;  Surgeon: Lockie Mola, MD;  Location: [facility];  Service: Ophthalmology;  Laterality: Right;  NOSE SURGERY  1967   SEPTOPLASTY     SHOULDER ARTHROSCOPY WITH OPEN ROTATOR CUFF REPAIR Right 12/05/2015   Procedure: SHOULDER ARTHROSCOPY  WITH MINI OPEN ROTATOR CUFF REPAIR. DISTAL CLAVICLE EXCISION;  Surgeon: Juanell Fairly, MD;  Location: [facility];  Service: Orthopedics;  Laterality: Right;    FAMILY HISTORY:  We obtained a detailed, 4-generation family history.  Significant diagnoses are listed below: Family History  Problem Relation Age of Onset   Heart disease Mother    Hyperlipidemia Mother    Hypertension Mother    Cancer Father        lung, prostate   Hyperlipidemia Father    Hypertension Father    Prostate cancer Father    Bladder Cancer Neg Hx    Kidney cancer Neg Hx     Ms. Grissom is unaware of previous family history of genetic testing for hereditary cancer risks. There is no reported Ashkenazi Jewish ancestry. There is no known consanguinity.  GENETIC COUNSELING ASSESSMENT: Jamie Elliott is a 77 y.o. female with a personal and family history of breast cancer which is somewhat suggestive of a hereditary cancer syndrome and predisposition to cancer. We, therefore, discussed and recommended the following at today's visit.   DISCUSSION: We discussed that approximately 10% of breast cancer is hereditary. Most cases of hereditary breast cancer are associated with BRCA1/BRCA2 genes, although there are other genes associated with hereditary cancer as well. Cancers and risks are gene specific. We discussed that testing is beneficial for several reasons including knowing about cancer risks, identifying potential screening and risk-reduction options that may be appropriate, and to understand if other family members could be at risk for cancer and allow them to undergo genetic testing.   We reviewed the characteristics, features and inheritance patterns of hereditary cancer syndromes. We also discussed genetic testing, including the appropriate family members to test, the process of testing, insurance coverage and turn-around-time for results. We discussed the implications of a negative, positive and/or variant of uncertain  significant result. We recommended Jamie Elliott pursue genetic testing for the [facility] Common Hereditary Cancers+RNA gene panel.   Based on Jamie Elliott's personal and family history of cancer, she meets medical criteria for genetic testing. Despite that she meets criteria, she may still have an out of pocket cost.   PLAN: After considering the risks, benefits, and limitations, Ms. Roquemore provided informed consent to pursue genetic testing and the blood sample was sent to [facility] for analysis of the Common Hereditary Cancers+RNA panel. Results should be available within approximately 2-3 weeks' time, at which point they will be disclosed by telephone to Ms. Starke, as will any additional recommendations warranted by these results. Ms. Canal will receive a summary of her genetic counseling visit and a copy of her results once available. This information will also be available in Epic.   *** Despite our recommendation, Ms. Mcwhinney did not wish to pursue genetic testing at today's visit. We understand this decision and remain available to coordinate genetic testing at any time in the future. We, therefore, recommend Ms. Polo continue to follow the cancer screening guidelines given by her primary healthcare provider.  ***Based on Ms. Pogue's family history, we recommended her *** have genetic counseling and testing. Ms. Jensen will let us know if we can be of any assistance in coordinating genetic counseling and/or testing for this family member.   Ms. Sarno questions were answered to her satisfaction today. Our contact information was provided should additional questions or concerns arise.  Thank you for the referral and allowing [place] to share in the care of your patient.   Lacy Duverney, MS, [facility] Genetic Counselor [email or web address] Phone: [phone]  The patient was seen for a total of *** minutes in face-to-face genetic counseling.  Dr. Orlie Dakin was available for discussion  regarding this case.   _______________________________________________________________________ For Office Staff:  Number of people involved in session: *** Was an Intern/ student involved with case: no

## 2023-05-13 NOTE — Progress Notes (Signed)
Oncotype Dx order submitted online on [facility] [phone]

## 2023-05-13 NOTE — Consult Note (Signed)
NEW PATIENT EVALUATION  Name: Jamie Elliott  MRN: [id number]  Date:   05/13/2023     DOB: [birth year]   This 77 y.o. female patient presents to the clinic for initial evaluation of stage Ia (pT1b N0 M0) ER/PR positive invasive mammary carcinoma.  With lobular features  REFERRING PHYSICIAN: Dale Oblong, MD  CHIEF COMPLAINT: No chief complaint on file.   DIAGNOSIS: The encounter diagnosis was Carcinoma of upper-outer quadrant of left breast in female, estrogen receptor positive (HCC).   PREVIOUS INVESTIGATIONS:  Mammogram and ultrasound reports reviewed Pathology reports reviewed Clinical notes reviewed  HPI: Patient is a 77 year old female who presented with an abnormal screening mammogram showing focal asymmetry in the upper outer quadrant of the left breast.  This was located at 2 o'clock position with a subtle sonographic correlate.  She underwent stereotactic biopsy positive for invasive mammary carcinoma.  Tumor measured 1.8 x 0.6 x 0.5 cm.  Core biopsy was positive for overall grade 1 invasive mammary carcinoma.  Patient underwent wide local excision for a invasive ductal carcinoma with lobular features measuring 7 mm.  There is also ductal carcinoma in situ present.  Margins were clear but close at less than 1 mm to posterior medial.  Tumor was overall grade 2.  No regional lymph nodes were submitted.  Patient is planning to have reexcision which I question based on the clear margins.  She has been seen by medical oncology and will have endocrine therapy after completion of radiation.  She is strongly ER/PR positive HER2/neu not overexpressed.  She is doing well specifically denies breast tenderness cough or bone pain.  PLANNED TREATMENT REGIMEN: Left hypofractionated whole breast radiation  PAST MEDICAL HISTORY:  has a past medical history of Environmental allergies, Hypercholesterolemia, Hypertension, and Migraines.    PAST SURGICAL HISTORY:  Past Surgical History:  Procedure  Laterality Date   ABDOMINAL HYSTERECTOMY  2000   APPENDECTOMY  2000   CATARACT EXTRACTION W/PHACO Left 04/23/2021   Procedure: CATARACT EXTRACTION PHACO AND INTRAOCULAR LENS PLACEMENT (IOC) LEFT panoptix toric 10.41 01:22.8 12.6%;  Surgeon: Lockie Mola, MD;  Location: [facility];  Service: Ophthalmology;  Laterality: Left;   CATARACT EXTRACTION W/PHACO Right 05/07/2021   Procedure: CATARACT EXTRACTION PHACO AND INTRAOCULAR LENS PLACEMENT (IOC) RIGHT  panoptix 12.85 01:33.8 13.7%;  Surgeon: Lockie Mola, MD;  Location: [facility];  Service: Ophthalmology;  Laterality: Right;   NOSE SURGERY  1967   SEPTOPLASTY     SHOULDER ARTHROSCOPY WITH OPEN ROTATOR CUFF REPAIR Right 12/05/2015   Procedure: SHOULDER ARTHROSCOPY WITH MINI OPEN ROTATOR CUFF REPAIR. DISTAL CLAVICLE EXCISION;  Surgeon: Juanell Fairly, MD;  Location: [facility] ORS;  Service: Orthopedics;  Laterality: Right;    FAMILY HISTORY: family history includes Cancer in her father; Heart disease in her mother; Hyperlipidemia in her father and mother; Hypertension in her father and mother; Prostate cancer in her father.  SOCIAL HISTORY:  reports that she has never smoked. She has never used smokeless tobacco. She reports that she does not drink alcohol and does not use drugs.  ALLERGIES: Ciprofloxacin, Zithromax [azithromycin], Augmentin [amoxicillin-pot clavulanate], and Doxycycline  MEDICATIONS:  Current Outpatient Medications  Medication Sig Dispense Refill   acetaminophen (TYLENOL) 500 MG tablet Take 500 mg by mouth every 6 (six) hours as needed.     amLODipine (NORVASC) 5 MG tablet Take 1 tablet (5 mg total) by mouth daily. 90 tablet 2   Ascorbic Acid (VITAMIN C) 1000 MG tablet Take 1,000 mg by mouth daily.  atorvastatin (LIPITOR) 40 MG tablet Take 1 tablet (40 mg total) by mouth daily. 90 tablet 3   cetirizine (ZYRTEC) 10 MG tablet Take 1 tablet (10 mg total) by mouth daily. 30 tablet 0    Cholecalciferol (VITAMIN D3) 25 MCG (1000 UT) CAPS Take 1 capsule by mouth daily.     CRANBERRY EXTRACT PO Take 1 capsule by mouth 2 (two) times daily.     D-Mannose 500 MG CAPS Take 500 mg by mouth in the morning and at bedtime.     hydrochlorothiazide (HYDRODIURIL) 12.5 MG tablet TAKE ONE TABLET DAILY. 90 tablet 0   losartan (COZAAR) 100 MG tablet Take 1 tablet (100 mg total) by mouth daily. 90 tablet 2   polyethylene glycol (MIRALAX / GLYCOLAX) 17 g packet Take 17 g by mouth every other day.     Probiotic Product (ALIGN PO) Take by mouth as needed.     triamcinolone (NASACORT) 55 MCG/ACT nasal inhaler Place 2 sprays into the nose as needed. 1 Inhaler 5   No current facility-administered medications for this encounter.    ECOG PERFORMANCE STATUS:  0 - Asymptomatic  REVIEW OF SYSTEMS: Patient denies any weight loss, fatigue, weakness, fever, chills or night sweats. Patient denies any loss of vision, blurred vision. Patient denies any ringing  of the ears or hearing loss. No irregular heartbeat. Patient denies heart murmur or history of fainting. Patient denies any chest pain or pain radiating to her upper extremities. Patient denies any shortness of breath, difficulty breathing at night, cough or hemoptysis. Patient denies any swelling in the lower legs. Patient denies any nausea vomiting, vomiting of blood, or coffee ground material in the vomitus. Patient denies any stomach pain. Patient states has had normal bowel movements no significant constipation or diarrhea. Patient denies any dysuria, hematuria or significant nocturia. Patient denies any problems walking, swelling in the joints or loss of balance. Patient denies any skin changes, loss of hair or loss of weight. Patient denies any excessive worrying or anxiety or significant depression. Patient denies any problems with insomnia. Patient denies excessive thirst, polyuria, polydipsia. Patient denies any swollen glands, patient denies easy  bruising or easy bleeding. Patient denies any recent infections, allergies or URI. Patient "s visual fields have not changed significantly in recent time.   PHYSICAL EXAM: BP 110/61 (BP Location: Left Arm, Patient Position: Sitting)   Pulse 82   Temp 97.9 F (36.6 C) (Tympanic)   Resp 18   Ht 5\' 3"  (1.6 m)   Wt 125 lb 3.2 oz (56.8 kg)   LMP 11/28/1998   SpO2 99%   BMI 22.18 kg/m  Patient status post wide local stage in the left breast no axillary or sentinel node incision was performed.  Incision is well-healed no dominant masses noted in either breast no axillary or supraclavicular adenopathy is identified.  Well-developed well-nourished patient in NAD. HEENT reveals PERLA, EOMI, discs not visualized.  Oral cavity is clear. No oral mucosal lesions are identified. Neck is clear without evidence of cervical or supraclavicular adenopathy. Lungs are clear to A&P. Cardiac examination is essentially unremarkable with regular rate and rhythm without murmur rub or thrill. Abdomen is benign with no organomegaly or masses noted. Motor sensory and DTR levels are equal and symmetric in the upper and lower extremities. Cranial nerves II through XII are grossly intact. Proprioception is intact. No peripheral adenopathy or edema is identified. No motor or sensory levels are noted. Crude visual fields are within normal range.  LABORATORY DATA:  Pathology reports reviewed    RADIOLOGY RESULTS: Mammogram ultrasound reports reviewed compatible with above-stated findings   IMPRESSION: Stage Ia (cT1b N0 M0) ER/PR positive invasive mammary carcinoma with lobular features of the upper outer quadrant of the left breast in 77 year old female  PLAN: At this time I question the need for reexcision based on the clear margins.  This does not follow NCCN guidelines.  I have advised the patient of this and she will be discussing this with her surgeon I will personally contact her surgeon.  I would plan on delivering whole  breast radiation a hypofractionated course of treatment over 3 weeks boosting her scar another 1000 centigrade.  Risks and benefits of treatment including skin reaction fatigue alteration of blood counts all were discussed in detail with the patient.  She seems to comprehend my treatment plan well.  If she subsides and reexcision this will postpone her radiation another month.  Patient also be candidate for endocrine therapy after completion of radiation.  I would like to take this opportunity to thank you for allowing me to participate in the care of your patient.Carmina Miller, MD

## 2023-05-13 NOTE — Assessment & Plan Note (Addendum)
#   Left breast stage I breast cancer ER/PR positive HER2 negative [s/p lumpectomy [facility]; Dr.Hwang] ; pT1b pSnLNx-M0- G-2.   # I reviewed with patient and her husband in general regarding the treatment options of breast cancer including-surgery; adjuvant radiation; role of adjuvant systemic therapy including-chemotherapy antihormone therapy.    # Left breast surgery s/p lumpectomy-medial margin less than 1mm.  Status post evaluation at [facility] regarding reexcision.  Also status post evaluation with radiation oncology for postlumpectomy radiation.  Await final decision regarding local therapy before proceeding with systemic therapy- see below.   # With regards to systemic therapy-patient will UNlikely need chemotherapy.  However discussed regarding Oncotype testing-tumor is pT1b; and grade 2.  After extensive discussion back-and-forth patient decided to proceed with Oncotype testing.  Will order.  Again patient very unlikely to receive chemotherapy.  Will discuss regarding endocrine therapy after local therapy is done.  # Genetic counseling: Given personal history of breast cancer; and family history-we will recommend genetic counseling.  Await appt today.  # DISPOSITION: # please send off oncotype- surgery at [facility]-  # follow up TBD- Dr.B

## 2023-05-13 NOTE — Telephone Encounter (Signed)
Pt calling stating that she would like her urine rechecked before she has surgery on 05/20/23 at [facility]. Pt complains of pressure, no fever, blood, nausea or vomiting, no odor. No appts available due to holiday on Monday, pt states she will wait until after surgery.

## 2023-05-13 NOTE — Progress Notes (Signed)
[facility] CONSULT NOTE  Patient Care Team: Dale , MD as PCP - General (Internal Medicine) Hulen Luster, RN as Oncology Nurse Navigator  CHIEF COMPLAINTS/PURPOSE OF CONSULTATION: Breast cancer  #  Oncology History Overview Note   [facility]- MARCH 2024-  There is a subtle 0.8 cm x 0.6 cm x 0.5 cm  irregularly shaped,  Invasive adenocarcinoma of the breast (7 mm on core). Histologic type: Ductal with lobular features Provisional Nottingham combined histologic grade: 2 of 3 Tubule formation score: 3 Nuclear pleomorphism score: 2 Mitotic rate score:  1   Ductal carcinoma in situ (DCIS): Present Type of in-situ carcinoma: Solid and cribriform Nuclear grade of in-situ carcinoma: 2  MARGINS   Margin Status for Invasive Carcinoma:    All margins negative for invasive carcinoma     Distance from Invasive Carcinoma to Closest Margin:    Less than: 1 mm     Closest Margin(s) to Invasive Carcinoma:    Medial   Margin Status for DCIS:    All margins negative for DCIS     Distance from DCIS to Closest Margin:    Greater than: 5 mm     Closest Margin(s) to DCIS:    all margins   REGIONAL LYMPH NODES   Regional Lymph Node Status:    Not applicable (no regional lymph nodes submitted or found)     pT Category:    pT1b   pN Category:    pN not assigned (no nodes submitted or found)   SPECIAL STUDIES     Estrogen Receptor (ER) Status:    Positive (greater than 10% of cells demonstrate nuclear positivity)       Percentage of Cells with Nuclear Positivity:    99 %     Progesterone Receptor (PgR) Status:    Positive       Percentage of Cells with Nuclear Positivity:    64 %     HER2 (by immunohistochemistry):    Negative (Score 1+)      Carcinoma of upper-outer quadrant of left breast in female, estrogen receptor positive (HCC)  03/22/2023 Initial Diagnosis   Carcinoma of upper-outer quadrant of left breast in female, estrogen receptor positive   03/22/2023 Cancer Staging    Staging form: Breast, AJCC 8th Edition - Clinical: Stage IA (cT1b, cN0, cM0, G2, ER+, PR+, HER2-) - Signed by Earna Coder, MD on 03/22/2023 Histologic grading system: 3 grade system    HISTORY OF PRESENTING ILLNESS: Patient ambulating-independently.Accompanied by family-husband.   Jamie Elliott 77 y.o.  female patient with left  breast cancer stage I ER/PR positive HER2 negative status postlumpectomy [[facility]] is here for follow-up.  Patient is recovering well from surgery.  Of note patient had recent postop evaluation at [facility]-regarding medial margin less than 1 mm.  With consideration of repeat surgery next week  Patient had appt with Dr. Aggie Cosier this morning.   Review of Systems  Constitutional:  Negative for chills, diaphoresis, fever, malaise/fatigue and weight loss.  HENT:  Negative for nosebleeds and sore throat.   Eyes:  Negative for double vision.  Respiratory:  Negative for cough, hemoptysis, sputum production, shortness of breath and wheezing.   Cardiovascular:  Negative for chest pain, palpitations, orthopnea and leg swelling.  Gastrointestinal:  Negative for abdominal pain, blood in stool, constipation, diarrhea, heartburn, melena, nausea and vomiting.  Genitourinary:  Negative for dysuria, frequency and urgency.  Musculoskeletal:  Negative for back pain and joint pain.  Skin: Negative.  Negative for itching and rash.  Neurological:  Negative for dizziness, tingling, focal weakness, weakness and headaches.  Endo/Heme/Allergies:  Does not bruise/bleed easily.  Psychiatric/Behavioral:  Negative for depression. The patient is not nervous/anxious and does not have insomnia.      MEDICAL HISTORY:  Past Medical History:  Diagnosis Date   Environmental allergies    Hypercholesterolemia    Hypertension    Migraines     SURGICAL HISTORY: Past Surgical History:  Procedure Laterality Date   ABDOMINAL HYSTERECTOMY  2000   APPENDECTOMY  2000   CATARACT EXTRACTION  W/PHACO Left 04/23/2021   Procedure: CATARACT EXTRACTION PHACO AND INTRAOCULAR LENS PLACEMENT (IOC) LEFT panoptix toric 10.41 01:22.8 12.6%;  Surgeon: Lockie Mola, MD;  Location: [facility];  Service: Ophthalmology;  Laterality: Left;   CATARACT EXTRACTION W/PHACO Right 05/07/2021   Procedure: CATARACT EXTRACTION PHACO AND INTRAOCULAR LENS PLACEMENT (IOC) RIGHT  panoptix 12.85 01:33.8 13.7%;  Surgeon: Lockie Mola, MD;  Location: [facility];  Service: Ophthalmology;  Laterality: Right;   NOSE SURGERY  1967   SEPTOPLASTY     SHOULDER ARTHROSCOPY WITH OPEN ROTATOR CUFF REPAIR Right 12/05/2015   Procedure: SHOULDER ARTHROSCOPY WITH MINI OPEN ROTATOR CUFF REPAIR. DISTAL CLAVICLE EXCISION;  Surgeon: Juanell Fairly, MD;  Location: [facility] ORS;  Service: Orthopedics;  Laterality: Right;    SOCIAL HISTORY: Social History   Socioeconomic History   Marital status: Married    Spouse name: Not on file   Number of children: 2   Years of education: Not on file   Highest education level: Bachelor's degree (e.g., BA, AB, BS)  Occupational History   Not on file  Tobacco Use   Smoking status: Never   Smokeless tobacco: Never  Vaping Use   Vaping Use: Never used  Substance and Sexual Activity   Alcohol use: No    Alcohol/week: 0.0 standard drinks of alcohol   Drug use: No   Sexual activity: Not Currently  Other Topics Concern   Not on file  Social History Narrative   Not on file   Social Determinants of Health   Financial Resource Strain: Low Risk  (03/15/2023)   Overall Financial Resource Strain (CARDIA)    Difficulty of Paying Living Expenses: Not hard at all  Food Insecurity: No Food Insecurity (03/15/2023)   Hunger Vital Sign    Worried About Running Out of Food in the Last Year: Never true    Ran Out of Food in the Last Year: Never true  Transportation Needs: No Transportation Needs (03/15/2023)   PRAPARE - Administrator, Civil Service  (Medical): No    Lack of Transportation (Non-Medical): No  Physical Activity: Sufficiently Active (03/15/2023)   Exercise Vital Sign    Days of Exercise per Week: 5 days    Minutes of Exercise per Session: 40 min  Stress: No Stress Concern Present (03/15/2023)   [employer] of Occupational Health - Occupational Stress Questionnaire    Feeling of Stress : Not at all  Social Connections: Socially Integrated (03/15/2023)   Social Connection and Isolation Panel [NHANES]    Frequency of Communication with Friends and Family: More than three times a week    Frequency of Social Gatherings with Friends and Family: Twice a week    Attends Religious Services: More than 4 times per year    Active Member of [employer] or Organizations: Yes    Attends Banker Meetings: More than 4 times per year    Marital Status:  Married  Intimate Partner Violence: Not At Risk (01/04/2023)   Humiliation, Afraid, Rape, and Kick questionnaire    Fear of Current or Ex-Partner: No    Emotionally Abused: No    Physically Abused: No    Sexually Abused: No    FAMILY HISTORY: Family History  Problem Relation Age of Onset   Heart disease Mother    Hyperlipidemia Mother    Hypertension Mother    Cancer Father        lung, prostate   Hyperlipidemia Father    Hypertension Father    Prostate cancer Father    Bladder Cancer Neg Hx    Kidney cancer Neg Hx     ALLERGIES:  is allergic to ciprofloxacin, zithromax [azithromycin], augmentin [amoxicillin-pot clavulanate], and doxycycline.  MEDICATIONS:  Current Outpatient Medications  Medication Sig Dispense Refill   acetaminophen (TYLENOL) 500 MG tablet Take 500 mg by mouth every 6 (six) hours as needed.     amLODipine (NORVASC) 5 MG tablet Take 1 tablet (5 mg total) by mouth daily. 90 tablet 2   Ascorbic Acid (VITAMIN C) 1000 MG tablet Take 1,000 mg by mouth daily.     atorvastatin (LIPITOR) 40 MG tablet Take 1 tablet (40 mg total) by mouth daily. 90  tablet 3   cetirizine (ZYRTEC) 10 MG tablet Take 1 tablet (10 mg total) by mouth daily. 30 tablet 0   Cholecalciferol (VITAMIN D3) 25 MCG (1000 UT) CAPS Take 1 capsule by mouth daily.     CRANBERRY EXTRACT PO Take 1 capsule by mouth 2 (two) times daily.     D-Mannose 500 MG CAPS Take 500 mg by mouth in the morning and at bedtime.     hydrochlorothiazide (HYDRODIURIL) 12.5 MG tablet TAKE ONE TABLET DAILY. 90 tablet 0   losartan (COZAAR) 100 MG tablet Take 1 tablet (100 mg total) by mouth daily. 90 tablet 2   polyethylene glycol (MIRALAX / GLYCOLAX) 17 g packet Take 17 g by mouth every other day.     Probiotic Product (ALIGN PO) Take by mouth as needed.     triamcinolone (NASACORT) 55 MCG/ACT nasal inhaler Place 2 sprays into the nose as needed. 1 Inhaler 5   No current facility-administered medications for this visit.     PHYSICAL EXAMINATION:   Vitals:   05/13/23 1102  BP: 110/61  Pulse: 82  Resp: 18  Temp: 97.9 F (36.6 C)    Filed Weights   05/13/23 1102  Weight: 127 lb 6.5 oz (57.8 kg)     Physical Exam Vitals and nursing note reviewed.  HENT:     Head: Normocephalic and atraumatic.     Mouth/Throat:     Pharynx: Oropharynx is clear.  Eyes:     Extraocular Movements: Extraocular movements intact.     Pupils: Pupils are equal, round, and reactive to light.  Cardiovascular:     Rate and Rhythm: Normal rate and regular rhythm.  Pulmonary:     Comments: Decreased breath sounds bilaterally.  Abdominal:     Palpations: Abdomen is soft.  Musculoskeletal:        General: Normal range of motion.     Cervical back: Normal range of motion.  Skin:    General: Skin is warm.  Neurological:     General: No focal deficit present.     Mental Status: She is alert and oriented to person, place, and time.  Psychiatric:        Behavior: Behavior normal.  Judgment: Judgment normal.      LABORATORY DATA:  I have reviewed the data as listed Lab Results  Component  Value Date   WBC 6.0 04/07/2022   HGB 13.6 04/07/2022   HCT 40.0 04/07/2022   MCV 91.7 04/07/2022   PLT 227.0 04/07/2022   Recent Labs    06/12/22 0846 10/16/22 0800 01/14/23 0815  NA 142 141 142  K 3.9 3.9 3.8  CL 105 104 105  CO2 32 29 29  GLUCOSE 89 80 88  BUN 18 18 17   CREATININE 0.76 0.73 0.69  CALCIUM 10.3 10.0 9.8  PROT 6.0 6.1 6.1  ALBUMIN 3.9 4.0 4.0  AST 16 15 16   ALT 17 18 16   ALKPHOS 83 85 83  BILITOT 0.5 0.6 0.5  BILIDIR 0.1 0.1 0.1    RADIOGRAPHIC STUDIES: I have personally reviewed the radiological images as listed and agreed with the findings in the report. US RENAL  Result Date: 05/06/2023 CLINICAL DATA:  Current UTI.  Angio myelolipoma. EXAM: RENAL / URINARY TRACT ULTRASOUND COMPLETE COMPARISON:  CT of the abdomen and pelvis March 17, 2022. Renal ultrasound June 25, 2022. FINDINGS: Right Kidney: Renal measurements: 8.9 x 3.6 x 4.5 cm = volume: 74.4 mL. Contains a 7.6 mm shadowing calcification, likely a stone. Mild hydronephrosis which improves on postvoid imaging. Left Kidney: Renal measurements: 9.6 x 3.9 x 4.3 cm = volume: 84 mL. Contains 2 cysts with the largest measuring 2.6 cm. No follow-up imaging recommended for the cysts. Bladder: Small postvoid residual of 34.8 cc. The bladder is otherwise normal. Other: None. IMPRESSION: 1. Mild right hydronephrosis which improves on postvoid imaging. 2. 7.6 mm shadowing calcification in the right kidney, likely a stone. 3. Small postvoid residual in the bladder of 34.8 cc. The bladder is otherwise normal. Electronically Signed   By: Gerome Sam III M.D.   On: 05/06/2023 16:48    ASSESSMENT & PLAN:   Carcinoma of upper-outer quadrant of left breast in female, estrogen receptor positive (HCC) # Left breast stage I breast cancer ER/PR positive HER2 negative [s/p lumpectomy [facility]; Dr.Hwang] ; pT1b pSnLNx-M0- G-2.   # I reviewed with patient and her husband in general regarding the treatment options of breast cancer  including-surgery; adjuvant radiation; role of adjuvant systemic therapy including-chemotherapy antihormone therapy.    # Left breast surgery s/p lumpectomy-medial margin less than 1mm.  Status post evaluation at [facility] regarding reexcision.  Also status post evaluation with radiation oncology for postlumpectomy radiation.  Await final decision regarding local therapy before proceeding with systemic therapy- see below.   # With regards to systemic therapy-patient will UNlikely need chemotherapy.  However discussed regarding Oncotype testing-tumor is pT1b; and grade 2.  After extensive discussion back-and-forth patient decided to proceed with Oncotype testing.  Will order.  Again patient very unlikely to receive chemotherapy.  Will discuss regarding endocrine therapy after local therapy is done.  # Genetic counseling: Given personal history of breast cancer; and family history-we will recommend genetic counseling.  Await appt today.  # DISPOSITION: # please send off oncotype- surgery at [facility]-  # follow up TBD- Dr.B  All questions were answered. The patient/family knows to call the clinic with any problems, questions or concerns.    Earna Coder, MD 05/13/2023 12:20 PM

## 2023-05-14 ENCOUNTER — Other Ambulatory Visit: Payer: BC Managed Care – PPO

## 2023-05-14 ENCOUNTER — Telehealth: Payer: Self-pay | Admitting: *Deleted

## 2023-05-14 NOTE — Telephone Encounter (Signed)
Patient called reporting that she has decided that wants to have surgery and not radiation therapy.

## 2023-05-18 ENCOUNTER — Inpatient Hospital Stay (HOSPITAL_BASED_OUTPATIENT_CLINIC_OR_DEPARTMENT_OTHER): Payer: Medicare Other | Admitting: Licensed Clinical Social Worker

## 2023-05-18 ENCOUNTER — Encounter: Payer: Self-pay | Admitting: Licensed Clinical Social Worker

## 2023-05-18 DIAGNOSIS — Z170 Estrogen receptor positive status [ER+]: Secondary | ICD-10-CM | POA: Diagnosis not present

## 2023-05-18 DIAGNOSIS — Z8042 Family history of malignant neoplasm of prostate: Secondary | ICD-10-CM

## 2023-05-18 DIAGNOSIS — C50412 Malignant neoplasm of upper-outer quadrant of left female breast: Secondary | ICD-10-CM | POA: Diagnosis not present

## 2023-05-18 DIAGNOSIS — Z803 Family history of malignant neoplasm of breast: Secondary | ICD-10-CM | POA: Diagnosis not present

## 2023-05-18 NOTE — Progress Notes (Signed)
REFERRING PROVIDER: Earna Coder, MD [address] [place],  [place] [address]  PRIMARY PROVIDER:  Dale Shawano, MD  PRIMARY REASON FOR VISIT:  1. Carcinoma of upper-outer quadrant of left breast in female, estrogen receptor positive (HCC)   2. Family history of breast cancer   3. Family history of prostate cancer      HISTORY OF PRESENT ILLNESS:   Ms. Donithan, a 77 y.o. female, was seen for a [facility] cancer genetics consultation at the request of Dr. Donneta Romberg due to a personal and family history of cancer.  Ms. Crayne presents to clinic today to discuss the possibility of a hereditary predisposition to cancer, genetic testing, and to further clarify her future cancer risks, as well as potential cancer risks for family members.   In 2024, at the age of 23, Ms. Wirz was diagnosed with left breast cancer, ER/PR+ HER2-. The treatment plan includes lumpectomy, possible repeat surgery 05/20/2023.She will have Oncotype testing to determine chemotherapy benefit, although unlikely to need it, and endocrine therapy.   CANCER HISTORY:  Oncology History Overview Note   [facility]- MARCH 2024-  There is a subtle 0.8 cm x 0.6 cm x 0.5 cm  irregularly shaped,  Invasive adenocarcinoma of the breast (7 mm on core). Histologic type: Ductal with lobular features Provisional Nottingham combined histologic grade: 2 of 3 Tubule formation score: 3 Nuclear pleomorphism score: 2 Mitotic rate score:  1   Ductal carcinoma in situ (DCIS): Present Type of in-situ carcinoma: Solid and cribriform Nuclear grade of in-situ carcinoma: 2  MARGINS   Margin Status for Invasive Carcinoma:    All margins negative for invasive carcinoma     Distance from Invasive Carcinoma to Closest Margin:    Less than: 1 mm     Closest Margin(s) to Invasive Carcinoma:    Medial   Margin Status for DCIS:    All margins negative for DCIS     Distance from DCIS to Closest Margin:    Greater than: 5 mm     Closest  Margin(s) to DCIS:    all margins   REGIONAL LYMPH NODES   Regional Lymph Node Status:    Not applicable (no regional lymph nodes submitted or found)     pT Category:    pT1b   pN Category:    pN not assigned (no nodes submitted or found)   SPECIAL STUDIES     Estrogen Receptor (ER) Status:    Positive (greater than 10% of cells demonstrate nuclear positivity)       Percentage of Cells with Nuclear Positivity:    99 %     Progesterone Receptor (PgR) Status:    Positive       Percentage of Cells with Nuclear Positivity:    64 %     HER2 (by immunohistochemistry):    Negative (Score 1+)      Carcinoma of upper-outer quadrant of left breast in female, estrogen receptor positive (HCC)  03/22/2023 Initial Diagnosis   Carcinoma of upper-outer quadrant of left breast in female, estrogen receptor positive   03/22/2023 Cancer Staging   Staging form: Breast, AJCC 8th Edition - Clinical: Stage IA (cT1b, cN0, cM0, G2, ER+, PR+, HER2-) - Signed by Earna Coder, MD on 03/22/2023 Histologic grading system: 3 grade system    Past Medical History:  Diagnosis Date   Environmental allergies    Hypercholesterolemia    Hypertension    Migraines     Past Surgical History:  Procedure  Laterality Date   ABDOMINAL HYSTERECTOMY  2000   APPENDECTOMY  2000   CATARACT EXTRACTION W/PHACO Left 04/23/2021   Procedure: CATARACT EXTRACTION PHACO AND INTRAOCULAR LENS PLACEMENT (IOC) LEFT panoptix toric 10.41 01:22.8 12.6%;  Surgeon: Lockie Mola, MD;  Location: [facility];  Service: Ophthalmology;  Laterality: Left;   CATARACT EXTRACTION W/PHACO Right 05/07/2021   Procedure: CATARACT EXTRACTION PHACO AND INTRAOCULAR LENS PLACEMENT (IOC) RIGHT  panoptix 12.85 01:33.8 13.7%;  Surgeon: Lockie Mola, MD;  Location: [facility];  Service: Ophthalmology;  Laterality: Right;   NOSE SURGERY  1967   SEPTOPLASTY     SHOULDER ARTHROSCOPY WITH OPEN ROTATOR CUFF REPAIR Right 12/05/2015    Procedure: SHOULDER ARTHROSCOPY WITH MINI OPEN ROTATOR CUFF REPAIR. DISTAL CLAVICLE EXCISION;  Surgeon: Juanell Fairly, MD;  Location: [facility];  Service: Orthopedics;  Laterality: Right;    FAMILY HISTORY:  We obtained a detailed, 4-generation family history.  Significant diagnoses are listed below: Family History  Problem Relation Age of Onset   Heart disease Mother    Hyperlipidemia Mother    Hypertension Mother    Lung cancer Father        dx 66s   Hyperlipidemia Father    Hypertension Father    Prostate cancer Father        dx 94s   Breast cancer Maternal Aunt        dx 60s-70s   Breast cancer Maternal Grandmother        dx 90s   Bladder Cancer Neg Hx    Kidney cancer Neg Hx    Ms. Issa has 1 son, 42, and 1 daughter, 7, no cancers. She has 2 grandsons and 1 granddaughter. She has 2 brothers, no cancers.  Ms. Thibeaux's mother died at 16. Maternal aunt had breast cancer in her 31s-70s and passed at 29. She had history of smoking. Maternal grandmother also had breast cancer, diagnosed in her 61s, she passed at 67.   Ms. Huante's father had lung and prostate cancer, he passed at 52. No other known cancers on this side of the family.  Ms. Kapner is unaware of previous family history of genetic testing for hereditary cancer risks. There is no reported Ashkenazi Jewish ancestry. There is no known consanguinity.   GENETIC COUNSELING ASSESSMENT: Ms. Plescia is a 77 y.o. female with a personal and family history of breast cancer which is somewhat suggestive of a hereditary cancer syndrome and predisposition to cancer. We, therefore, discussed and recommended the following at today's visit.   DISCUSSION: We discussed that approximately 10% of breast cancer is hereditary. Most cases of hereditary breast cancer are associated with BRCA1/BRCA2 genes, although there are other genes associated with hereditary cancer as well. Cancers and risks are gene specific. We discussed that testing  is beneficial for several reasons including knowing about cancer risks, identifying potential screening and risk-reduction options that may be appropriate, and to understand if other family members could be at risk for cancer and allow them to undergo genetic testing.   We reviewed the characteristics, features and inheritance patterns of hereditary cancer syndromes. We also discussed genetic testing, including the appropriate family members to test, the process of testing, insurance coverage and turn-around-time for results. We discussed the implications of a negative, positive and/or variant of uncertain significant result. We recommended Ms. Desautels pursue genetic testing for the [facility] Multi-Cancer+RNA gene panel.   Based on Ms. Trinka's personal and family history of cancer, she meets medical criteria for genetic testing. Despite  that she meets criteria, she may still have an out of pocket cost.   PLAN: After considering the risks, benefits, and limitations, Ms. Hasbun provided informed consent to pursue genetic testing and the blood sample was sent to [facility] for analysis of the Multi-Cancer+RNA panel. Results should be available within approximately 2-3 weeks' time, at which point they will be disclosed by telephone to Ms. Hempstead, as will any additional recommendations warranted by these results. Ms. Theilen will receive a summary of her genetic counseling visit and a copy of her results once available. This information will also be available in Epic.   Ms. Sharf questions were answered to her satisfaction today. Our contact information was provided should additional questions or concerns arise. Thank you for the referral and allowing [place] to share in the care of your patient.   Lacy Duverney, MS, [facility] Genetic Counselor [email or web address] Phone: [phone]  The patient was seen for a total of 25 minutes in face-to-face genetic counseling.  Patient's husband was also  present. Dr. Orlie Dakin was available for discussion regarding this case.   _______________________________________________________________________ For Office Staff:  Number of people involved in session: 2 Was an Intern/ student involved with case: no

## 2023-05-20 ENCOUNTER — Ambulatory Visit: Payer: BC Managed Care – PPO | Admitting: Internal Medicine

## 2023-05-20 DIAGNOSIS — N62 Hypertrophy of breast: Secondary | ICD-10-CM | POA: Diagnosis not present

## 2023-05-20 DIAGNOSIS — C50412 Malignant neoplasm of upper-outer quadrant of left female breast: Secondary | ICD-10-CM | POA: Diagnosis not present

## 2023-05-20 DIAGNOSIS — N6092 Unspecified benign mammary dysplasia of left breast: Secondary | ICD-10-CM | POA: Diagnosis not present

## 2023-05-20 DIAGNOSIS — Z880 Allergy status to penicillin: Secondary | ICD-10-CM | POA: Diagnosis not present

## 2023-05-20 DIAGNOSIS — Z79899 Other long term (current) drug therapy: Secondary | ICD-10-CM | POA: Diagnosis not present

## 2023-05-20 DIAGNOSIS — Z170 Estrogen receptor positive status [ER+]: Secondary | ICD-10-CM | POA: Diagnosis not present

## 2023-05-20 DIAGNOSIS — Z881 Allergy status to other antibiotic agents status: Secondary | ICD-10-CM | POA: Diagnosis not present

## 2023-05-20 DIAGNOSIS — I10 Essential (primary) hypertension: Secondary | ICD-10-CM | POA: Diagnosis not present

## 2023-05-22 HISTORY — PX: BREAST SURGERY: SHX581

## 2023-05-24 ENCOUNTER — Encounter: Payer: Self-pay | Admitting: Licensed Clinical Social Worker

## 2023-05-24 ENCOUNTER — Telehealth: Payer: Self-pay | Admitting: Licensed Clinical Social Worker

## 2023-05-24 ENCOUNTER — Ambulatory Visit: Payer: Self-pay | Admitting: Licensed Clinical Social Worker

## 2023-05-24 DIAGNOSIS — Z170 Estrogen receptor positive status [ER+]: Secondary | ICD-10-CM

## 2023-05-24 DIAGNOSIS — Z1379 Encounter for other screening for genetic and chromosomal anomalies: Secondary | ICD-10-CM

## 2023-05-24 NOTE — Progress Notes (Signed)
HPI:   Ms. Joaquin was previously seen in the [facility] due to a personal and family history of cancer and concerns regarding a hereditary predisposition to cancer. Please refer to our prior cancer genetics clinic note for more information regarding our discussion, assessment and recommendations, at the time. Ms. Montealegre recent genetic test results were disclosed to her, as were recommendations warranted by these results. These results and recommendations are discussed in more detail below.  CANCER HISTORY:  Oncology History Overview Note   [facility]- MARCH 2024-  There is a subtle 0.8 cm x 0.6 cm x 0.5 cm  irregularly shaped,  Invasive adenocarcinoma of the breast (7 mm on core). Histologic type: Ductal with lobular features Provisional Nottingham combined histologic grade: 2 of 3 Tubule formation score: 3 Nuclear pleomorphism score: 2 Mitotic rate score:  1   Ductal carcinoma in situ (DCIS): Present Type of in-situ carcinoma: Solid and cribriform Nuclear grade of in-situ carcinoma: 2  MARGINS   Margin Status for Invasive Carcinoma:    All margins negative for invasive carcinoma     Distance from Invasive Carcinoma to Closest Margin:    Less than: 1 mm     Closest Margin(s) to Invasive Carcinoma:    Medial   Margin Status for DCIS:    All margins negative for DCIS     Distance from DCIS to Closest Margin:    Greater than: 5 mm     Closest Margin(s) to DCIS:    all margins   REGIONAL LYMPH NODES   Regional Lymph Node Status:    Not applicable (no regional lymph nodes submitted or found)     pT Category:    pT1b   pN Category:    pN not assigned (no nodes submitted or found)   SPECIAL STUDIES     Estrogen Receptor (ER) Status:    Positive (greater than 10% of cells demonstrate nuclear positivity)       Percentage of Cells with Nuclear Positivity:    99 %     Progesterone Receptor (PgR) Status:    Positive       Percentage of Cells with Nuclear Positivity:    64  %     HER2 (by immunohistochemistry):    Negative (Score 1+)      Carcinoma of upper-outer quadrant of left breast in female, estrogen receptor positive (HCC)  03/22/2023 Initial Diagnosis   Carcinoma of upper-outer quadrant of left breast in female, estrogen receptor positive   03/22/2023 Cancer Staging   Staging form: Breast, AJCC 8th Edition - Clinical: Stage IA (cT1b, cN0, cM0, G2, ER+, PR+, HER2-) - Signed by Earna Coder, MD on 03/22/2023 Histologic grading system: 3 grade system    Genetic Testing   Negative genetic testing. No pathogenic variants identified on the [facility] Multi-Cancer+RNA panel. The report date is 05/23/2023.  The Multi-Cancer + RNA Panel offered by [facility] includes sequencing and/or deletion/duplication analysis of the following 70 genes:  AIP*, ALK, APC*, ATM*, AXIN2*, BAP1*, BARD1*, BLM*, BMPR1A*, BRCA1*, BRCA2*, BRIP1*, CDC73*, CDH1*, CDK4, CDKN1B*, CDKN2A, CHEK2*, CTNNA1*, DICER1*, EPCAM, EGFR, FH*, FLCN*, GREM1, HOXB13, KIT, LZTR1, MAX*, MBD4, MEN1*, MET, MITF, MLH1*, MSH2*, MSH3*, MSH6*, MUTYH*, NF1*, NF2*, NTHL1*, PALB2*, PDGFRA, PMS2*, POLD1*, POLE*, POT1*, PRKAR1A*, PTCH1*, PTEN*, RAD51C*, RAD51D*, RB1*, RET, SDHA*, SDHAF2*, SDHB*, SDHC*, SDHD*, SMAD4*, SMARCA4*, SMARCB1*, SMARCE1*, STK11*, SUFU*, TMEM127*, TP53*, TSC1*, TSC2*, VHL*. RNA analysis is performed for * genes.'     FAMILY HISTORY:  We obtained a detailed, 4-generation  family history.  Significant diagnoses are listed below: Family History  Problem Relation Age of Onset   Heart disease Mother    Hyperlipidemia Mother    Hypertension Mother    Lung cancer Father        dx 58s   Hyperlipidemia Father    Hypertension Father    Prostate cancer Father        dx 56s   Breast cancer Maternal Aunt        dx 60s-70s   Breast cancer Maternal Grandmother        dx 90s   Bladder Cancer Neg Hx    Kidney cancer Neg Hx     Ms. Bieser has 1 son, 62, and 1 daughter, 6, no cancers. She has 2  grandsons and 1 granddaughter. She has 2 brothers, no cancers.   Ms. Andreatta's mother died at 69. Maternal aunt had breast cancer in her 102s-70s and passed at 81. She had history of smoking. Maternal grandmother also had breast cancer, diagnosed in her 17s, she passed at 42.    Ms. Lett's father had lung and prostate cancer, he passed at 60. No other known cancers on this side of the family.   Ms. Prevo is unaware of previous family history of genetic testing for hereditary cancer risks. There is no reported Ashkenazi Jewish ancestry. There is no known consanguinity.   GENETIC TEST RESULTS:  The [facility] Multi-Cancer+RNA Panel found no pathogenic mutations.   The Multi-Cancer + RNA Panel offered by [facility] includes sequencing and/or deletion/duplication analysis of the following 70 genes:  AIP*, ALK, APC*, ATM*, AXIN2*, BAP1*, BARD1*, BLM*, BMPR1A*, BRCA1*, BRCA2*, BRIP1*, CDC73*, CDH1*, CDK4, CDKN1B*, CDKN2A, CHEK2*, CTNNA1*, DICER1*, EPCAM, EGFR, FH*, FLCN*, GREM1, HOXB13, KIT, LZTR1, MAX*, MBD4, MEN1*, MET, MITF, MLH1*, MSH2*, MSH3*, MSH6*, MUTYH*, NF1*, NF2*, NTHL1*, PALB2*, PDGFRA, PMS2*, POLD1*, POLE*, POT1*, PRKAR1A*, PTCH1*, PTEN*, RAD51C*, RAD51D*, RB1*, RET, SDHA*, SDHAF2*, SDHB*, SDHC*, SDHD*, SMAD4*, SMARCA4*, SMARCB1*, SMARCE1*, STK11*, SUFU*, TMEM127*, TP53*, TSC1*, TSC2*, VHL*. RNA analysis is performed for * genes.  The test report has been scanned into EPIC and is located under the Molecular Pathology section of the Results Review tab.  A portion of the result report is included below for reference. Genetic testing reported out on 05/23/2023.     Even though a pathogenic variant was not identified, possible explanations for the cancer in the family may include: There may be no hereditary risk for cancer in the family. The cancers in Ms. Splain and/or her family may be sporadic/familial or due to other genetic and environmental factors. There may be a gene mutation in one of these  genes that current testing methods cannot detect but that chance is small. There could be another gene that has not yet been discovered, or that we have not yet tested, that is responsible for the cancer diagnoses in the family.  It is also possible there is a hereditary cause for the cancer in the family that Ms. Peetz did not inherit.  Therefore, it is important to remain in touch with cancer genetics in the future so that we can continue to offer Ms. Kaeo the most up to date genetic testing.   ADDITIONAL GENETIC TESTING:  We discussed with Ms. Pessin that her genetic testing was fairly extensive.  If there are additional relevant genes identified to increase cancer risk that can be analyzed in the future, we would be happy to discuss and coordinate this testing at that time.    CANCER SCREENING RECOMMENDATIONS:  Ms. Szarka test result is considered negative (normal).  This means that we have not identified a hereditary cause for her personal and family history of cancer at this time.   An individual's cancer risk and medical management are not determined by genetic test results alone. Overall cancer risk assessment incorporates additional factors, including personal medical history, family history, and any available genetic information that may result in a personalized plan for cancer prevention and surveillance. Therefore, it is recommended she continue to follow the cancer management and screening guidelines provided by her oncology and primary healthcare provider.  RECOMMENDATIONS FOR FAMILY MEMBERS:   Since she did not inherit a identifiable mutation in a cancer predisposition gene included on this panel, her children could not have inherited a known mutation from her in one of these genes. Individuals in this family might be at some increased risk of developing cancer, over the general population risk, due to the family history of cancer.  Individuals in the family should notify their  providers of the family history of cancer. We recommend women in this family have a yearly mammogram beginning at age 50, or 61 years younger than the earliest onset of cancer, an annual clinical breast exam, and perform monthly breast self-exams.  Family members should have colonoscopies by at age 75, or earlier, as recommended by their providers.  FOLLOW-UP:  Lastly, we discussed with Ms. Dub that cancer genetics is a rapidly advancing field and it is possible that new genetic tests will be appropriate for her and/or her family members in the future. We encouraged her to remain in contact with cancer genetics on an annual basis so we can update her personal and family histories and let her know of advances in cancer genetics that may benefit this family.   Our contact number was provided. Ms. Gunner questions were answered to her satisfaction, and she knows she is welcome to call us at anytime with additional questions or concerns.    Lacy Duverney, MS, [facility] Genetic Counselor [email or web address] Phone: [phone]

## 2023-05-24 NOTE — Telephone Encounter (Signed)
I contacted Ms. Werkmeister to discuss her genetic testing results. No pathogenic variants were identified in the 70 genes analyzed. Detailed clinic note to follow.   The test report has been scanned into EPIC and is located under the Molecular Pathology section of the Results Review tab.  A portion of the result report is included below for reference.      Lacy Duverney, MS, [facility] Genetic Counselor [email or web address] Phone: [phone]

## 2023-05-25 DIAGNOSIS — C50412 Malignant neoplasm of upper-outer quadrant of left female breast: Secondary | ICD-10-CM | POA: Diagnosis not present

## 2023-05-25 DIAGNOSIS — Z170 Estrogen receptor positive status [ER+]: Secondary | ICD-10-CM | POA: Diagnosis not present

## 2023-05-27 ENCOUNTER — Encounter: Payer: Self-pay | Admitting: *Deleted

## 2023-05-27 NOTE — Progress Notes (Signed)
Spoke with Jamie Elliott to given oncotype results and inform her chemo is not needed.   She is unsure if she will be receiving radiation or not.  She has post-op appt on 6/12.  I will call her back Thursday 6/13 to discuss when she needs to follow back up with Dr. Leonard Schwartz.

## 2023-05-29 ENCOUNTER — Other Ambulatory Visit: Payer: Self-pay | Admitting: Internal Medicine

## 2023-05-31 ENCOUNTER — Other Ambulatory Visit (INDEPENDENT_AMBULATORY_CARE_PROVIDER_SITE_OTHER): Payer: Medicare Other

## 2023-05-31 DIAGNOSIS — E7800 Pure hypercholesterolemia, unspecified: Secondary | ICD-10-CM | POA: Diagnosis not present

## 2023-05-31 LAB — BASIC METABOLIC PANEL
BUN: 14 mg/dL (ref 6–23)
CO2: 29 mEq/L (ref 19–32)
Calcium: 9.8 mg/dL (ref 8.4–10.5)
Chloride: 104 mEq/L (ref 96–112)
Creatinine, Ser: 0.72 mg/dL (ref 0.40–1.20)
GFR: 80.69 mL/min (ref >60.00)
Glucose, Bld: 86 mg/dL (ref 70–99)
Potassium: 3.6 mEq/L (ref 3.5–5.1)
Sodium: 141 mEq/L (ref 135–145)

## 2023-05-31 LAB — LIPID PANEL
Cholesterol: 179 mg/dL (ref 0–200)
HDL: 57.8 mg/dL (ref >39.00)
LDL Cholesterol: 84 mg/dL (ref 0–99)
NonHDL: 121.27
Total CHOL/HDL Ratio: 3
Triglycerides: 185 mg/dL — ABNORMAL HIGH (ref 0.0–149.0)
VLDL: 37 mg/dL (ref 0.0–40.0)

## 2023-05-31 LAB — HEPATIC FUNCTION PANEL
ALT: 39 U/L — ABNORMAL HIGH (ref 0–35)
AST: 26 U/L (ref 0–37)
Albumin: 4 g/dL (ref 3.5–5.2)
Alkaline Phosphatase: 93 U/L (ref 39–117)
Bilirubin, Direct: 0.1 mg/dL (ref 0.0–0.3)
Total Bilirubin: 0.5 mg/dL (ref 0.2–1.2)
Total Protein: 6.4 g/dL (ref 6.0–8.3)

## 2023-06-02 ENCOUNTER — Encounter: Payer: BC Managed Care – PPO | Admitting: Licensed Clinical Social Worker

## 2023-06-02 DIAGNOSIS — Z170 Estrogen receptor positive status [ER+]: Secondary | ICD-10-CM | POA: Diagnosis not present

## 2023-06-02 DIAGNOSIS — C50412 Malignant neoplasm of upper-outer quadrant of left female breast: Secondary | ICD-10-CM | POA: Diagnosis not present

## 2023-06-03 ENCOUNTER — Encounter: Payer: Self-pay | Admitting: Internal Medicine

## 2023-06-03 ENCOUNTER — Ambulatory Visit (INDEPENDENT_AMBULATORY_CARE_PROVIDER_SITE_OTHER): Payer: Medicare Other | Admitting: Internal Medicine

## 2023-06-03 ENCOUNTER — Encounter: Payer: Self-pay | Admitting: *Deleted

## 2023-06-03 VITALS — BP 124/72 | HR 89 | Temp 98.0°F | Resp 16 | Ht 59.0 in | Wt 124.0 lb

## 2023-06-03 DIAGNOSIS — I10 Essential (primary) hypertension: Secondary | ICD-10-CM

## 2023-06-03 DIAGNOSIS — R7989 Other specified abnormal findings of blood chemistry: Secondary | ICD-10-CM | POA: Diagnosis not present

## 2023-06-03 DIAGNOSIS — E7800 Pure hypercholesterolemia, unspecified: Secondary | ICD-10-CM | POA: Diagnosis not present

## 2023-06-03 DIAGNOSIS — E559 Vitamin D deficiency, unspecified: Secondary | ICD-10-CM

## 2023-06-03 DIAGNOSIS — D219 Benign neoplasm of connective and other soft tissue, unspecified: Secondary | ICD-10-CM | POA: Diagnosis not present

## 2023-06-03 DIAGNOSIS — K5909 Other constipation: Secondary | ICD-10-CM

## 2023-06-03 DIAGNOSIS — F439 Reaction to severe stress, unspecified: Secondary | ICD-10-CM

## 2023-06-03 DIAGNOSIS — M810 Age-related osteoporosis without current pathological fracture: Secondary | ICD-10-CM

## 2023-06-03 DIAGNOSIS — Z170 Estrogen receptor positive status [ER+]: Secondary | ICD-10-CM

## 2023-06-03 DIAGNOSIS — C50412 Malignant neoplasm of upper-outer quadrant of left female breast: Secondary | ICD-10-CM

## 2023-06-03 DIAGNOSIS — E2839 Other primary ovarian failure: Secondary | ICD-10-CM

## 2023-06-03 NOTE — Assessment & Plan Note (Signed)
Continue lipitor.  Low cholesterol diet and exercise.  Follow lipid panel and liver function tests.   

## 2023-06-03 NOTE — Progress Notes (Signed)
Spoke with Ms. Vohra.  She will be getting radiation at [facility].  She goes back on 6/24 for simulation.  I will call her after that to see when she will be finishing radiation so we can get her back on Dr. Senaida Lange schedule.

## 2023-06-03 NOTE — Assessment & Plan Note (Signed)
Continue amlodipine and hctz.  Follow pressures.  Follow metabolic panel.  

## 2023-06-03 NOTE — Progress Notes (Signed)
Subjective:    Patient ID: Jamie Elliott, female    DOB: 12-22-1945, 77 y.o.   MRN: [id number]  Patient here for  Chief Complaint  Patient presents with   Medical Management of Chronic Issues    HPI Here to follow up regarding her blood pressure and cholesterol. Recently diagnosed with breast cancer. S/p left breast partial mastectomy 04/22/23. Had to go back for f/u surgery 05/20/23.  Did well with her surgeries.  Tissues healing well.  No pain.  Increased stress related to above.  Had questions about her treatment.  Planning XRT.   Saw urology 05/03/23 - recommended renal ultrasound 05/2023. (Ending surveillance 2026).  No urinary symptoms currently.  Bowels doing well.  Taking prn mirlax.  No abdominal pain.  No vomiting.  Discussed labs.  Has been eating differently and not exercising  as much.      Past Medical History:  Diagnosis Date   Environmental allergies    Hypercholesterolemia    Hypertension    Migraines    Past Surgical History:  Procedure Laterality Date   ABDOMINAL HYSTERECTOMY  2000   APPENDECTOMY  2000   CATARACT EXTRACTION W/PHACO Left 04/23/2021   Procedure: CATARACT EXTRACTION PHACO AND INTRAOCULAR LENS PLACEMENT (IOC) LEFT panoptix toric 10.41 01:22.8 12.6%;  Surgeon: Lockie Mola, MD;  Location: [facility];  Service: Ophthalmology;  Laterality: Left;   CATARACT EXTRACTION W/PHACO Right 05/07/2021   Procedure: CATARACT EXTRACTION PHACO AND INTRAOCULAR LENS PLACEMENT (IOC) RIGHT  panoptix 12.85 01:33.8 13.7%;  Surgeon: Lockie Mola, MD;  Location: [facility];  Service: Ophthalmology;  Laterality: Right;   NOSE SURGERY  1967   SEPTOPLASTY     SHOULDER ARTHROSCOPY WITH OPEN ROTATOR CUFF REPAIR Right 12/05/2015   Procedure: SHOULDER ARTHROSCOPY WITH MINI OPEN ROTATOR CUFF REPAIR. DISTAL CLAVICLE EXCISION;  Surgeon: Juanell Fairly, MD;  Location: [facility];  Service: Orthopedics;  Laterality: Right;   Family History  Problem Relation  Age of Onset   Heart disease Mother    Hyperlipidemia Mother    Hypertension Mother    Lung cancer Father        dx 71s   Hyperlipidemia Father    Hypertension Father    Prostate cancer Father        dx 22s   Breast cancer Maternal Aunt        dx 60s-70s   Breast cancer Maternal Grandmother        dx 90s   Bladder Cancer Neg Hx    Kidney cancer Neg Hx    Social History   Socioeconomic History   Marital status: Married    Spouse name: Not on file   Number of children: 2   Years of education: Not on file   Highest education level: Bachelor's degree (e.g., BA, AB, BS)  Occupational History   Not on file  Tobacco Use   Smoking status: Never   Smokeless tobacco: Never  Vaping Use   Vaping Use: Never used  Substance and Sexual Activity   Alcohol use: No    Alcohol/week: 0.0 standard drinks of alcohol   Drug use: No   Sexual activity: Not Currently  Other Topics Concern   Not on file  Social History Narrative   Not on file   Social Determinants of Health   Financial Resource Strain: Low Risk  (03/15/2023)   Overall Financial Resource Strain (CARDIA)    Difficulty of Paying Living Expenses: Not hard at all  Food Insecurity: No Food Insecurity (  03/15/2023)   Hunger Vital Sign    Worried About Running Out of Food in the Last Year: Never true    Ran Out of Food in the Last Year: Never true  Transportation Needs: No Transportation Needs (03/15/2023)   PRAPARE - Administrator, Civil Service (Medical): No    Lack of Transportation (Non-Medical): No  Physical Activity: Sufficiently Active (03/15/2023)   Exercise Vital Sign    Days of Exercise per Week: 5 days    Minutes of Exercise per Session: 40 min  Stress: No Stress Concern Present (03/15/2023)   [employer] of Occupational Health - Occupational Stress Questionnaire    Feeling of Stress : Not at all  Social Connections: Socially Integrated (03/15/2023)   Social Connection and Isolation Panel [NHANES]     Frequency of Communication with Friends and Family: More than three times a week    Frequency of Social Gatherings with Friends and Family: Twice a week    Attends Religious Services: More than 4 times per year    Active Member of [employer] or Organizations: Yes    Attends Engineer, structural: More than 4 times per year    Marital Status: Married     Review of Systems  Constitutional:  Negative for appetite change and unexpected weight change.  HENT:  Negative for congestion and sinus pressure.   Respiratory:  Negative for cough, chest tightness and shortness of breath.   Cardiovascular:  Negative for chest pain, palpitations and leg swelling.  Gastrointestinal:  Negative for abdominal pain, diarrhea, nausea and vomiting.  Genitourinary:  Negative for difficulty urinating and dysuria.  Musculoskeletal:  Negative for joint swelling and myalgias.  Skin:  Negative for color change and rash.  Neurological:  Negative for dizziness and headaches.  Psychiatric/Behavioral:  Negative for agitation and dysphoric mood.        Objective:     BP 124/72   Pulse 89   Temp 98 F (36.7 C)   Resp 16   Ht 4\' 11"  (1.499 m)   Wt 124 lb (56.2 kg)   LMP 11/28/1998   SpO2 98%   BMI 25.04 kg/m  Wt Readings from Last 3 Encounters:  06/03/23 124 lb (56.2 kg)  05/13/23 125 lb 3.2 oz (56.8 kg)  05/13/23 127 lb 6.5 oz (57.8 kg)    Physical Exam Vitals reviewed.  Constitutional:      General: She is not in acute distress.    Appearance: Normal appearance.  HENT:     Head: Normocephalic and atraumatic.     Right Ear: External ear normal.     Left Ear: External ear normal.  Eyes:     General: No scleral icterus.       Right eye: No discharge.        Left eye: No discharge.     Conjunctiva/sclera: Conjunctivae normal.  Neck:     Thyroid: No thyromegaly.  Cardiovascular:     Rate and Rhythm: Normal rate and regular rhythm.  Pulmonary:     Effort: No respiratory distress.     Breath  sounds: Normal breath sounds. No wheezing.  Abdominal:     General: Bowel sounds are normal.     Palpations: Abdomen is soft.     Tenderness: There is no abdominal tenderness.  Musculoskeletal:        General: No swelling or tenderness.     Cervical back: Neck supple. No tenderness.  Lymphadenopathy:     Cervical: No cervical  adenopathy.  Skin:    Findings: No erythema or rash.  Neurological:     Mental Status: She is alert.  Psychiatric:        Mood and Affect: Mood normal.        Behavior: Behavior normal.      Outpatient Encounter Medications as of 06/03/2023  Medication Sig   acetaminophen (TYLENOL) 500 MG tablet Take 500 mg by mouth every 6 (six) hours as needed.   amLODipine (NORVASC) 5 MG tablet Take 1 tablet (5 mg total) by mouth daily.   Ascorbic Acid (VITAMIN C) 1000 MG tablet Take 1,000 mg by mouth daily.   atorvastatin (LIPITOR) 40 MG tablet Take 1 tablet (40 mg total) by mouth daily.   cetirizine (ZYRTEC) 10 MG tablet Take 1 tablet (10 mg total) by mouth daily.   Cholecalciferol (VITAMIN D3) 25 MCG (1000 UT) CAPS Take 1 capsule by mouth daily.   CRANBERRY EXTRACT PO Take 1 capsule by mouth 2 (two) times daily.   D-Mannose 500 MG CAPS Take 500 mg by mouth in the morning and at bedtime.   hydrochlorothiazide (HYDRODIURIL) 12.5 MG tablet TAKE ONE TABLET DAILY.   losartan (COZAAR) 100 MG tablet Take 1 tablet (100 mg total) by mouth daily.   polyethylene glycol (MIRALAX / GLYCOLAX) 17 g packet Take 17 g by mouth every other day.   Probiotic Product (ALIGN PO) Take by mouth as needed.   triamcinolone (NASACORT) 55 MCG/ACT nasal inhaler Place 2 sprays into the nose as needed.   No facility-administered encounter medications on file as of 06/03/2023.     Lab Results  Component Value Date   WBC 6.0 04/07/2022   HGB 13.6 04/07/2022   HCT 40.0 04/07/2022   PLT 227.0 04/07/2022   GLUCOSE 86 05/31/2023   CHOL 179 05/31/2023   TRIG 185.0 (H) 05/31/2023   HDL 57.80  05/31/2023   LDLDIRECT 103.0 05/21/2015   LDLCALC 84 05/31/2023   ALT 39 (H) 05/31/2023   AST 26 05/31/2023   NA 141 05/31/2023   K 3.6 05/31/2023   CL 104 05/31/2023   CREATININE 0.72 05/31/2023   BUN 14 05/31/2023   CO2 29 05/31/2023   TSH 1.75 01/14/2023   INR 1.0 03/17/2022    No results found.     Assessment & Plan:  Primary hypertension Assessment & Plan: Continue amlodipine and hctz.  Follow pressures.  Follow metabolic panel.     Hypercholesterolemia Assessment & Plan: Continue lipitor.  Low cholesterol diet and exercise.  Follow lipid panel and liver function tests.    Abnormal liver function tests Assessment & Plan: One liver test on recent labs wnl.  Recheck liver panel.   Orders: -     Hepatic function panel; Future  Angioleiomyoma Assessment & Plan: Saw urology 06/26/22 - recommended f/u ultrasound in 05/2023.    Carcinoma of upper-outer quadrant of left breast in female, estrogen receptor positive (HCC) Assessment & Plan: Left breast stage I breast cancer ER/PR positive HER2 negative [s/p lumpectomy [facility]; Dr.Hwang] ; pT1b pSnLNx-M0- G-2. S/p left breast partial mastectomy 04/22/23. Had to go back for f/u surgery 05/20/23.  Did well with her surgeries.  Tissues healing well.  No pain.  Increased stress related to above.  Had questions about her treatment.  Planning XRT.    Other constipation Assessment & Plan: Taking miralax prn.  Stable.    Osteoporosis without current pathological fracture, unspecified osteoporosis type Assessment & Plan: Needs bone density.  Schedule.    Stress  Assessment & Plan: Appears to be handling things relatively well.  Follow.     Vitamin D deficiency Assessment & Plan: Vitamin D level wnl  - 12/2022.    Estrogen deficiency -     DG Bone Density; Future     Dale Toco, MD

## 2023-06-04 ENCOUNTER — Encounter: Payer: Self-pay | Admitting: Internal Medicine

## 2023-06-09 ENCOUNTER — Encounter: Payer: Self-pay | Admitting: Internal Medicine

## 2023-06-09 ENCOUNTER — Telehealth: Payer: Self-pay | Admitting: Internal Medicine

## 2023-06-09 NOTE — Telephone Encounter (Signed)
Please schedule Jamie Elliott for bone density.  Order is in.  Thanks.

## 2023-06-09 NOTE — Assessment & Plan Note (Signed)
Needs bone density.  Schedule.   

## 2023-06-09 NOTE — Assessment & Plan Note (Signed)
Saw urology 06/26/22 - recommended f/u ultrasound in 05/2023.  

## 2023-06-09 NOTE — Assessment & Plan Note (Signed)
One liver test on recent labs wnl.  Recheck liver panel.

## 2023-06-09 NOTE — Assessment & Plan Note (Signed)
Appears to be handling things relatively well.  Follow.  

## 2023-06-09 NOTE — Assessment & Plan Note (Signed)
Left breast stage I breast cancer ER/PR positive HER2 negative [s/p lumpectomy [facility]; Dr.Hwang] ; pT1b pSnLNx-M0- G-2. S/p left breast partial mastectomy 04/22/23. Had to go back for f/u surgery 05/20/23.  Did well with her surgeries.  Tissues healing well.  No pain.  Increased stress related to above.  Had questions about her treatment.  Planning XRT.

## 2023-06-09 NOTE — Assessment & Plan Note (Signed)
Vitamin D level wnl  - 12/2022.

## 2023-06-09 NOTE — Assessment & Plan Note (Signed)
Taking miralax prn.  Stable.

## 2023-06-10 NOTE — Telephone Encounter (Signed)
Patient scheduled for DEXA and aware of date and time.

## 2023-06-14 ENCOUNTER — Ambulatory Visit: Payer: BC Managed Care – PPO | Admitting: Radiation Oncology

## 2023-06-14 DIAGNOSIS — C50412 Malignant neoplasm of upper-outer quadrant of left female breast: Secondary | ICD-10-CM | POA: Diagnosis not present

## 2023-06-14 DIAGNOSIS — Z170 Estrogen receptor positive status [ER+]: Secondary | ICD-10-CM | POA: Diagnosis not present

## 2023-06-15 ENCOUNTER — Encounter: Payer: Self-pay | Admitting: *Deleted

## 2023-06-15 NOTE — Progress Notes (Signed)
Jamie Elliott will finish radiation at [facility] on 7/29.   Dr. B would like to see her back 2 weeks after radiation.   She will see him on 8/12 at 10:15.  Appt. Details given to her.

## 2023-06-22 DIAGNOSIS — C50412 Malignant neoplasm of upper-outer quadrant of left female breast: Secondary | ICD-10-CM | POA: Diagnosis not present

## 2023-06-22 DIAGNOSIS — Z170 Estrogen receptor positive status [ER+]: Secondary | ICD-10-CM | POA: Diagnosis not present

## 2023-06-23 DIAGNOSIS — C50412 Malignant neoplasm of upper-outer quadrant of left female breast: Secondary | ICD-10-CM | POA: Diagnosis not present

## 2023-06-28 DIAGNOSIS — C50412 Malignant neoplasm of upper-outer quadrant of left female breast: Secondary | ICD-10-CM | POA: Diagnosis not present

## 2023-06-29 ENCOUNTER — Ambulatory Visit: Payer: Medicare Other | Admitting: Urology

## 2023-07-01 ENCOUNTER — Other Ambulatory Visit (INDEPENDENT_AMBULATORY_CARE_PROVIDER_SITE_OTHER): Payer: Medicare Other

## 2023-07-01 DIAGNOSIS — R7989 Other specified abnormal findings of blood chemistry: Secondary | ICD-10-CM | POA: Diagnosis not present

## 2023-07-01 DIAGNOSIS — Z85828 Personal history of other malignant neoplasm of skin: Secondary | ICD-10-CM | POA: Diagnosis not present

## 2023-07-01 DIAGNOSIS — D2261 Melanocytic nevi of right upper limb, including shoulder: Secondary | ICD-10-CM | POA: Diagnosis not present

## 2023-07-01 DIAGNOSIS — D2262 Melanocytic nevi of left upper limb, including shoulder: Secondary | ICD-10-CM | POA: Diagnosis not present

## 2023-07-01 DIAGNOSIS — L570 Actinic keratosis: Secondary | ICD-10-CM | POA: Diagnosis not present

## 2023-07-01 DIAGNOSIS — D225 Melanocytic nevi of trunk: Secondary | ICD-10-CM | POA: Diagnosis not present

## 2023-07-01 LAB — HEPATIC FUNCTION PANEL
ALT: 15 U/L (ref 0–35)
AST: 16 U/L (ref 0–37)
Albumin: 3.9 g/dL (ref 3.5–5.2)
Alkaline Phosphatase: 81 U/L (ref 39–117)
Bilirubin, Direct: 0.1 mg/dL (ref 0.0–0.3)
Total Bilirubin: 0.7 mg/dL (ref 0.2–1.2)
Total Protein: 6.2 g/dL (ref 6.0–8.3)

## 2023-07-05 DIAGNOSIS — C50412 Malignant neoplasm of upper-outer quadrant of left female breast: Secondary | ICD-10-CM | POA: Diagnosis not present

## 2023-07-05 NOTE — Progress Notes (Signed)
07/06/2023 8:57 AM   Jamie Elliott 07-Mar-1946 [id number]  Referring provider: Dale Frankford, MD [address] [place],  [place] [address]  Urological history: 1. rUTI's -Risk factors: age, vaginal atrophy and constipation -KUB 12/2017 constipation.  Cystoscopy performed on 02/02/2018 by Dr. Apolinar Elliott noted subtle trigonitis but no significant pathology.   -documented urine cultures over the last year  May 03, 2023 - E.coli   July 17, 2022 no growth  July 10, 2022 E. coli   2. Angiomyolipoma -RUS 11/13/2020 ~ 2 cm cyst in the left kidney and a ~ 1 cm hyperechoic lesion in the right likely an angiomyolipoma -RUS 05/2021 - Stable 0.8 cm hyperechoic structure within the right renal pelvis, which could reflect a nonshadowing calculus or small angiomyolipoma.  Simple left renal cyst. -contrast CT, 02/2022 - simple appearing bilateral renal cyst -RUS (04/2023) - no angiomyolipoma's visualized  3. GSM -cervix, uterus and ovaries removed in 2000.  No malignancies noted -diagnosed with breast cancer (03/2023)   No chief complaint on file.   HPI: Jamie Elliott is a 77 y.o. female who presents today for yearly follow up.  Previous records reviewed.    She has been recently diagnosed with breast cancer.   PVR on 04/2023 RUS 34.8 cc.  Her ultrasound also noted some mild right hydronephrosis which resolved on postvoid imaging, a possible right renal calculi and 2 left renal cysts.   PMH: Past Medical History:  Diagnosis Date   Environmental allergies    Hypercholesterolemia    Hypertension    Migraines     Surgical History: Past Surgical History:  Procedure Laterality Date   ABDOMINAL HYSTERECTOMY  2000   APPENDECTOMY  2000   CATARACT EXTRACTION W/PHACO Left 04/23/2021   Procedure: CATARACT EXTRACTION PHACO AND INTRAOCULAR LENS PLACEMENT (IOC) LEFT panoptix toric 10.41 01:22.8 12.6%;  Surgeon: Jamie Mola, MD;  Location: [facility];   Service: Ophthalmology;  Laterality: Left;   CATARACT EXTRACTION W/PHACO Right 05/07/2021   Procedure: CATARACT EXTRACTION PHACO AND INTRAOCULAR LENS PLACEMENT (IOC) RIGHT  panoptix 12.85 01:33.8 13.7%;  Surgeon: Jamie Mola, MD;  Location: [facility];  Service: Ophthalmology;  Laterality: Right;   NOSE SURGERY  1967   SEPTOPLASTY     SHOULDER ARTHROSCOPY WITH OPEN ROTATOR CUFF REPAIR Right 12/05/2015   Procedure: SHOULDER ARTHROSCOPY WITH MINI OPEN ROTATOR CUFF REPAIR. DISTAL CLAVICLE EXCISION;  Surgeon: Jamie Fairly, MD;  Location: [facility];  Service: Orthopedics;  Laterality: Right;    Home Medications:  Current Outpatient Medications on File Prior to Visit  Medication Sig Dispense Refill   acetaminophen (TYLENOL) 500 MG tablet Take 500 mg by mouth every 6 (six) hours as needed.     amLODipine (NORVASC) 5 MG tablet Take 1 tablet (5 mg total) by mouth daily. 90 tablet 2   Ascorbic Acid (VITAMIN C) 1000 MG tablet Take 1,000 mg by mouth daily.     atorvastatin (LIPITOR) 40 MG tablet Take 1 tablet (40 mg total) by mouth daily. 90 tablet 3   cetirizine (ZYRTEC) 10 MG tablet Take 1 tablet (10 mg total) by mouth daily. 90 tablet 3   Cholecalciferol (VITAMIN D3) 25 MCG (1000 UT) CAPS Take 1 capsule by mouth daily.     CRANBERRY EXTRACT PO Take 1 capsule by mouth 2 (two) times daily.     D-Mannose 500 MG CAPS Take 500 mg by mouth in the morning and at bedtime.     hydrochlorothiazide (HYDRODIURIL) 12.5 MG tablet TAKE  ONE TABLET DAILY. 90 tablet 0   losartan (COZAAR) 100 MG tablet Take 1 tablet (100 mg total) by mouth daily. 90 tablet 2   polyethylene glycol (MIRALAX / GLYCOLAX) 17 g packet Take 17 g by mouth every other day.     Probiotic Product (ALIGN PO) Take by mouth as needed.     triamcinolone (NASACORT) 55 MCG/ACT nasal inhaler Place 2 sprays into the nose as needed. 1 Inhaler 5   No current facility-administered medications on file prior to visit.    Allergies:   Allergies  Allergen Reactions   Ciprofloxacin Other (See Comments)    Patient reports "it doesn't work for me". Last culture was sensitive, however.    Zithromax [Azithromycin]    Augmentin [Amoxicillin-Pot Clavulanate] Rash   Doxycycline Nausea And Vomiting    Sensitive on her stomach    Family History: Family History  Problem Relation Age of Onset   Heart disease Mother    Hyperlipidemia Mother    Hypertension Mother    Lung cancer Father        dx 75s   Hyperlipidemia Father    Hypertension Father    Prostate cancer Father        dx 53s   Breast cancer Maternal Aunt        dx 60s-70s   Breast cancer Maternal Grandmother        dx 90s   Bladder Cancer Neg Hx    Kidney cancer Neg Hx     Social History:  reports that she has never smoked. She has never used smokeless tobacco. She reports that she does not drink alcohol and does not use drugs.  ROS: Pertinent ROS in HPI  Physical Exam: LMP 11/28/1998   Constitutional:  Well nourished. Alert and oriented, No acute distress. HEENT: [place] AT, moist mucus membranes.  Trachea midline, no masses. Cardiovascular: No clubbing, cyanosis, or edema. Respiratory: Normal respiratory effort, no increased work of breathing. GU: No CVA tenderness.  No bladder fullness or masses. Vulvovaginal atrophy w/ pallor, loss of rugae, introital retraction, excoriations.  Vulvar thinning, fusion of labia, clitoral hood retraction, prominent urethral meatus.   *** external genitalia, *** pubic hair distribution, no lesions.  Normal urethral meatus, no lesions, no prolapse, no discharge.   No urethral masses, tenderness and/or tenderness. No bladder fullness, tenderness or masses. *** vagina mucosa, *** estrogen effect, no discharge, no lesions, *** pelvic support, *** cystocele and *** rectocele noted.  No cervical motion tenderness.  Uterus is freely mobile and non-fixed.  No adnexal/parametria masses or tenderness noted.  Anus and perineum are without  rashes or lesions.   ***  Neurologic: Grossly intact, no focal deficits, moving all 4 extremities. Psychiatric: Normal mood and affect.    Laboratory Data:    Latest Ref Rng & Units 05/31/2023   10:13 AM 01/14/2023    8:15 AM 10/16/2022    8:00 AM  BMP  Glucose 70 - 99 mg/dL 86  88  80   BUN 6 - 23 mg/dL 14  17  18    Creatinine 0.40 - 1.20 mg/dL 4.09  8.11  9.14   Sodium 135 - 145 mEq/L 141  142  141   Potassium 3.5 - 5.1 mEq/L 3.6  3.8  3.9   Chloride 96 - 112 mEq/L 104  105  104   CO2 19 - 32 mEq/L 29  29  29    Calcium 8.4 - 10.5 mg/dL 9.8  9.8  10.0  Legend: ! Abnormal I have reviewed the labs.   Pertinent Imaging: CLINICAL DATA:  Current UTI.  Angio myelolipoma.   EXAM: RENAL / URINARY TRACT ULTRASOUND COMPLETE   COMPARISON:  CT of the abdomen and pelvis March 17, 2022. Renal ultrasound June 25, 2022.   FINDINGS: Right Kidney:   Renal measurements: 8.9 x 3.6 x 4.5 cm = volume: 74.4 mL. Contains a 7.6 mm shadowing calcification, likely a stone. Mild hydronephrosis which improves on postvoid imaging.   Left Kidney:   Renal measurements: 9.6 x 3.9 x 4.3 cm = volume: 84 mL. Contains 2 cysts with the largest measuring 2.6 cm. No follow-up imaging recommended for the cysts.   Bladder:   Small postvoid residual of 34.8 cc. The bladder is otherwise normal.   Other:   None.   IMPRESSION: 1. Mild right hydronephrosis which improves on postvoid imaging. 2. 7.6 mm shadowing calcification in the right kidney, likely a stone. 3. Small postvoid residual in the bladder of 34.8 cc. The bladder is otherwise normal.     Electronically Signed   By: Gerome Sam III M.D.   On: 05/06/2023 16:48  I have independently reviewed the films.  See HPI.    Assessment & Plan:    1. rUTI's -Asymptomatic at today's visit  2. Vaginal atrophy -Newly diagnosed breast cancer, so not a candidate for vaginal estrogen cream  3. Right renal angiomyolipoma - not visualized on  recent RUS, so we can discontinue screening at this time -advised her if she should experience gross heme to contact us  4. Renal cysts -Benign, no further lesions warranted   5. Nephrolithiasis -asymptomatic at this visit    No follow-ups on file.  These notes generated with voice recognition software. I apologize for typographical errors.  Cloretta Ned  [facility] [address]  [address] [place], [place] [address] [phone]

## 2023-07-06 ENCOUNTER — Ambulatory Visit (INDEPENDENT_AMBULATORY_CARE_PROVIDER_SITE_OTHER): Payer: Medicare Other | Admitting: Urology

## 2023-07-06 ENCOUNTER — Encounter: Payer: Self-pay | Admitting: Urology

## 2023-07-06 VITALS — BP 117/72 | HR 83 | Ht 62.0 in | Wt 124.0 lb

## 2023-07-06 DIAGNOSIS — N200 Calculus of kidney: Secondary | ICD-10-CM

## 2023-07-06 DIAGNOSIS — Z87440 Personal history of urinary (tract) infections: Secondary | ICD-10-CM

## 2023-07-06 DIAGNOSIS — D1771 Benign lipomatous neoplasm of kidney: Secondary | ICD-10-CM

## 2023-07-06 DIAGNOSIS — N390 Urinary tract infection, site not specified: Secondary | ICD-10-CM

## 2023-07-06 DIAGNOSIS — R93421 Abnormal radiologic findings on diagnostic imaging of right kidney: Secondary | ICD-10-CM

## 2023-07-06 DIAGNOSIS — N281 Cyst of kidney, acquired: Secondary | ICD-10-CM

## 2023-07-06 DIAGNOSIS — N952 Postmenopausal atrophic vaginitis: Secondary | ICD-10-CM

## 2023-07-12 DIAGNOSIS — C50412 Malignant neoplasm of upper-outer quadrant of left female breast: Secondary | ICD-10-CM | POA: Diagnosis not present

## 2023-07-19 DIAGNOSIS — C50412 Malignant neoplasm of upper-outer quadrant of left female breast: Secondary | ICD-10-CM | POA: Diagnosis not present

## 2023-08-02 ENCOUNTER — Inpatient Hospital Stay: Payer: Medicare Other | Attending: Internal Medicine | Admitting: Internal Medicine

## 2023-08-02 ENCOUNTER — Encounter: Payer: Self-pay | Admitting: Internal Medicine

## 2023-08-02 ENCOUNTER — Encounter: Payer: Self-pay | Admitting: *Deleted

## 2023-08-02 VITALS — BP 123/66 | HR 85 | Temp 98.7°F | Ht 62.0 in | Wt 125.6 lb

## 2023-08-02 DIAGNOSIS — Z803 Family history of malignant neoplasm of breast: Secondary | ICD-10-CM | POA: Diagnosis not present

## 2023-08-02 DIAGNOSIS — Z170 Estrogen receptor positive status [ER+]: Secondary | ICD-10-CM

## 2023-08-02 DIAGNOSIS — C50412 Malignant neoplasm of upper-outer quadrant of left female breast: Secondary | ICD-10-CM

## 2023-08-02 DIAGNOSIS — Z801 Family history of malignant neoplasm of trachea, bronchus and lung: Secondary | ICD-10-CM | POA: Insufficient documentation

## 2023-08-02 NOTE — Progress Notes (Signed)
[facility] CONSULT NOTE  Patient Care Team: Dale Mermentau, MD as PCP - General (Internal Medicine) Hulen Luster, RN as Oncology Nurse Navigator Earna Coder, MD as Consulting Physician (Oncology)  CHIEF COMPLAINTS/PURPOSE OF CONSULTATION: Breast cancer  #  Oncology History Overview Note   [facility]- MARCH 2024-  There is a subtle 0.8 cm x 0.6 cm x 0.5 cm  irregularly shaped,  Invasive adenocarcinoma of the breast (7 mm on core). Histologic type: Ductal with lobular features Provisional Nottingham combined histologic grade: 2 of 3 Tubule formation score: 3 Nuclear pleomorphism score: 2 Mitotic rate score:  1   Ductal carcinoma in situ (DCIS): Present Type of in-situ carcinoma: Solid and cribriform Nuclear grade of in-situ carcinoma: 2  MARGINS   Margin Status for Invasive Carcinoma:    All margins negative for invasive carcinoma     Distance from Invasive Carcinoma to Closest Margin:    Less than: 1 mm     Closest Margin(s) to Invasive Carcinoma:    Medial   Margin Status for DCIS:    All margins negative for DCIS     Distance from DCIS to Closest Margin:    Greater than: 5 mm     Closest Margin(s) to DCIS:    all margins   REGIONAL LYMPH NODES   Regional Lymph Node Status:    Not applicable (no regional lymph nodes submitted or found)     pT Category:    pT1b   pN Category:    pN not assigned (no nodes submitted or found)   SPECIAL STUDIES     Estrogen Receptor (ER) Status:    Positive (greater than 10% of cells demonstrate nuclear positivity)       Percentage of Cells with Nuclear Positivity:    99 %     Progesterone Receptor (PgR) Status:    Positive       Percentage of Cells with Nuclear Positivity:    64 %     HER2 (by immunohistochemistry):    Negative (Score 1+)      Carcinoma of upper-outer quadrant of left breast in female, estrogen receptor positive (HCC)  03/22/2023 Initial Diagnosis   Carcinoma of upper-outer quadrant of left breast in  female, estrogen receptor positive   03/22/2023 Cancer Staging   Staging form: Breast, AJCC 8th Edition - Clinical: Stage IA (cT1b, cN0, cM0, G2, ER+, PR+, HER2-) - Signed by Earna Coder, MD on 03/22/2023 Histologic grading system: 3 grade system    Genetic Testing   Negative genetic testing. No pathogenic variants identified on the [facility] Multi-Cancer+RNA panel. The report date is 05/23/2023.  The Multi-Cancer + RNA Panel offered by [facility] includes sequencing and/or deletion/duplication analysis of the following 70 genes:  AIP*, ALK, APC*, ATM*, AXIN2*, BAP1*, BARD1*, BLM*, BMPR1A*, BRCA1*, BRCA2*, BRIP1*, CDC73*, CDH1*, CDK4, CDKN1B*, CDKN2A, CHEK2*, CTNNA1*, DICER1*, EPCAM, EGFR, FH*, FLCN*, GREM1, HOXB13, KIT, LZTR1, MAX*, MBD4, MEN1*, MET, MITF, MLH1*, MSH2*, MSH3*, MSH6*, MUTYH*, NF1*, NF2*, NTHL1*, PALB2*, PDGFRA, PMS2*, POLD1*, POLE*, POT1*, PRKAR1A*, PTCH1*, PTEN*, RAD51C*, RAD51D*, RB1*, RET, SDHA*, SDHAF2*, SDHB*, SDHC*, SDHD*, SMAD4*, SMARCA4*, SMARCB1*, SMARCE1*, STK11*, SUFU*, TMEM127*, TP53*, TSC1*, TSC2*, VHL*. RNA analysis is performed for * genes.'    HISTORY OF PRESENTING ILLNESS: Patient ambulating-independently.Accompanied by family-husband.   Jamie Elliott 78 y.o.  female patient with left  breast cancer stage I ER/PR positive HER2 negative status postlumpectomy [[facility]] is here for follow-up.  Pt finish radiation at [facility] on 7/29.  patient states she had to stop 3 meds while doing radiation, can she start those back.    Review of Systems  Constitutional:  Negative for chills, diaphoresis, fever, malaise/fatigue and weight loss.  HENT:  Negative for nosebleeds and sore throat.   Eyes:  Negative for double vision.  Respiratory:  Negative for cough, hemoptysis, sputum production, shortness of breath and wheezing.   Cardiovascular:  Negative for chest pain, palpitations, orthopnea and leg swelling.  Gastrointestinal:  Negative for abdominal pain, blood in stool,  constipation, diarrhea, heartburn, melena, nausea and vomiting.  Genitourinary:  Negative for dysuria, frequency and urgency.  Musculoskeletal:  Negative for back pain and joint pain.  Skin: Negative.  Negative for itching and rash.  Neurological:  Negative for dizziness, tingling, focal weakness, weakness and headaches.  Endo/Heme/Allergies:  Does not bruise/bleed easily.  Psychiatric/Behavioral:  Negative for depression. The patient is not nervous/anxious and does not have insomnia.      MEDICAL HISTORY:  Past Medical History:  Diagnosis Date   Environmental allergies    Hypercholesterolemia    Hypertension    Migraines     SURGICAL HISTORY: Past Surgical History:  Procedure Laterality Date   ABDOMINAL HYSTERECTOMY  2000   APPENDECTOMY  2000   CATARACT EXTRACTION W/PHACO Left 04/23/2021   Procedure: CATARACT EXTRACTION PHACO AND INTRAOCULAR LENS PLACEMENT (IOC) LEFT panoptix toric 10.41 01:22.8 12.6%;  Surgeon: Lockie Mola, MD;  Location: [facility];  Service: Ophthalmology;  Laterality: Left;   CATARACT EXTRACTION W/PHACO Right 05/07/2021   Procedure: CATARACT EXTRACTION PHACO AND INTRAOCULAR LENS PLACEMENT (IOC) RIGHT  panoptix 12.85 01:33.8 13.7%;  Surgeon: Lockie Mola, MD;  Location: [facility];  Service: Ophthalmology;  Laterality: Right;   NOSE SURGERY  1967   SEPTOPLASTY     SHOULDER ARTHROSCOPY WITH OPEN ROTATOR CUFF REPAIR Right 12/05/2015   Procedure: SHOULDER ARTHROSCOPY WITH MINI OPEN ROTATOR CUFF REPAIR. DISTAL CLAVICLE EXCISION;  Surgeon: Juanell Fairly, MD;  Location: [facility];  Service: Orthopedics;  Laterality: Right;    SOCIAL HISTORY: Social History   Socioeconomic History   Marital status: Married    Spouse name: Not on file   Number of children: 2   Years of education: Not on file   Highest education level: Bachelor's degree (e.g., BA, AB, BS)  Occupational History   Not on file  Tobacco Use   Smoking status: Never    Smokeless tobacco: Never  Vaping Use   Vaping status: Never Used  Substance and Sexual Activity   Alcohol use: No    Alcohol/week: 0.0 standard drinks of alcohol   Drug use: No   Sexual activity: Not Currently  Other Topics Concern   Not on file  Social History Narrative   Not on file   Social Determinants of Health   Financial Resource Strain: Low Risk  (03/15/2023)   Overall Financial Resource Strain (CARDIA)    Difficulty of Paying Living Expenses: Not hard at all  Food Insecurity: No Food Insecurity (03/15/2023)   Hunger Vital Sign    Worried About Running Out of Food in the Last Year: Never true    Ran Out of Food in the Last Year: Never true  Transportation Needs: No Transportation Needs (03/15/2023)   PRAPARE - Administrator, Civil Service (Medical): No    Lack of Transportation (Non-Medical): No  Physical Activity: Sufficiently Active (03/15/2023)   Exercise Vital Sign    Days of Exercise per Week: 5 days    Minutes of  Exercise per Session: 40 min  Stress: No Stress Concern Present (03/15/2023)   [employer] of Occupational Health - Occupational Stress Questionnaire    Feeling of Stress : Not at all  Social Connections: Socially Integrated (03/15/2023)   Social Connection and Isolation Panel [NHANES]    Frequency of Communication with Friends and Family: More than three times a week    Frequency of Social Gatherings with Friends and Family: Twice a week    Attends Religious Services: More than 4 times per year    Active Member of [employer] or Organizations: Yes    Attends Engineer, structural: More than 4 times per year    Marital Status: Married  Catering manager Violence: Not At Risk (01/04/2023)   Humiliation, Afraid, Rape, and Kick questionnaire    Fear of Current or Ex-Partner: No    Emotionally Abused: No    Physically Abused: No    Sexually Abused: No    FAMILY HISTORY: Family History  Problem Relation Age of Onset   Heart disease  Mother    Hyperlipidemia Mother    Hypertension Mother    Lung cancer Father        dx 58s   Hyperlipidemia Father    Hypertension Father    Prostate cancer Father        dx 64s   Breast cancer Maternal Aunt        dx 60s-70s   Breast cancer Maternal Grandmother        dx 90s   Bladder Cancer Neg Hx    Kidney cancer Neg Hx     ALLERGIES:  is allergic to ciprofloxacin, zithromax [azithromycin], augmentin [amoxicillin-pot clavulanate], and doxycycline.  MEDICATIONS:  Current Outpatient Medications  Medication Sig Dispense Refill   acetaminophen (TYLENOL) 500 MG tablet Take 500 mg by mouth every 6 (six) hours as needed.     amLODipine (NORVASC) 5 MG tablet Take 1 tablet (5 mg total) by mouth daily. 90 tablet 2   Ascorbic Acid (VITAMIN C) 1000 MG tablet Take 1,000 mg by mouth daily.     atorvastatin (LIPITOR) 40 MG tablet Take 1 tablet (40 mg total) by mouth daily. 90 tablet 3   cetirizine (ZYRTEC) 10 MG tablet Take 1 tablet (10 mg total) by mouth daily. 90 tablet 3   Cholecalciferol (VITAMIN D3) 25 MCG (1000 UT) CAPS Take 1 capsule by mouth daily.     CRANBERRY EXTRACT PO Take 1 capsule by mouth 2 (two) times daily.     D-Mannose 500 MG CAPS Take 500 mg by mouth in the morning and at bedtime.     hydrochlorothiazide (HYDRODIURIL) 12.5 MG tablet TAKE ONE TABLET DAILY. 90 tablet 0   losartan (COZAAR) 100 MG tablet Take 1 tablet (100 mg total) by mouth daily. 90 tablet 2   polyethylene glycol (MIRALAX / GLYCOLAX) 17 g packet Take 17 g by mouth every other day.     Probiotic Product (ALIGN PO) Take by mouth as needed.     triamcinolone (NASACORT) 55 MCG/ACT nasal inhaler Place 2 sprays into the nose as needed. 1 Inhaler 5   No current facility-administered medications for this visit.     PHYSICAL EXAMINATION:   Vitals:   08/02/23 1010  BP: 123/66  Pulse: 85  Temp: 98.7 F (37.1 C)  SpO2: 97%     Filed Weights   08/02/23 1010  Weight: 125 lb 9.6 oz (57 kg)       Physical Exam Vitals and nursing note  reviewed.  HENT:     Head: Normocephalic and atraumatic.     Mouth/Throat:     Pharynx: Oropharynx is clear.  Eyes:     Extraocular Movements: Extraocular movements intact.     Pupils: Pupils are equal, round, and reactive to light.  Cardiovascular:     Rate and Rhythm: Normal rate and regular rhythm.  Pulmonary:     Comments: Decreased breath sounds bilaterally.  Abdominal:     Palpations: Abdomen is soft.  Musculoskeletal:        General: Normal range of motion.     Cervical back: Normal range of motion.  Skin:    General: Skin is warm.  Neurological:     General: No focal deficit present.     Mental Status: She is alert and oriented to person, place, and time.  Psychiatric:        Behavior: Behavior normal.        Judgment: Judgment normal.      LABORATORY DATA:  I have reviewed the data as listed Lab Results  Component Value Date   WBC 6.0 04/07/2022   HGB 13.6 04/07/2022   HCT 40.0 04/07/2022   MCV 91.7 04/07/2022   PLT 227.0 04/07/2022   Recent Labs    10/16/22 0800 01/14/23 0815 05/31/23 1013 07/01/23 0840  NA 141 142 141  --   K 3.9 3.8 3.6  --   CL 104 105 104  --   CO2 29 29 29   --   GLUCOSE 80 88 86  --   BUN 18 17 14   --   CREATININE 0.73 0.69 0.72  --   CALCIUM 10.0 9.8 9.8  --   PROT 6.1 6.1 6.4 6.2  ALBUMIN 4.0 4.0 4.0 3.9  AST 15 16 26 16   ALT 18 16 39* 15  ALKPHOS 85 83 93 81  BILITOT 0.6 0.5 0.5 0.7  BILIDIR 0.1 0.1 0.1 0.1    RADIOGRAPHIC STUDIES: I have personally reviewed the radiological images as listed and agreed with the findings in the report. No results found.  ASSESSMENT & PLAN:   Carcinoma of upper-outer quadrant of left breast in female, estrogen receptor positive (HCC) # Left breast stage I breast cancer ER/PR positive HER2 negative [s/p lumpectomy [facility]; Dr.Hwang] ; pT1b pSnLNx-M0- G-2. S/p re-excision [[facility]]- s/p radiation at [facility] on 7/29.  JUNE 2024- RS-23; recurrence  < 9.   # #Discussed the mechanism of action of aromatase inhibitors-with blocking of estrogen to prevent breast cancer.  Also discussed the potential side effects including but not limited to arthralgias hot flashes and increased risk of osteoporosis. Script for Anastrozole.   # Bone health- BMD with PCP- next week   # Genetic counseling: no pathogenic mutations.   # DISPOSITION: # follow up in 6 weeks-MD: no labs- Dr.B  All questions were answered. The patient/family knows to call the clinic with any problems, questions or concerns.    Earna Coder, MD 08/02/2023 11:03 AM

## 2023-08-02 NOTE — Assessment & Plan Note (Addendum)
#   Left breast stage I breast cancer ER/PR positive HER2 negative [s/p lumpectomy [facility]; Dr.Hwang] ; pT1b pSnLNx-M0- G-2. S/p re-excision [[facility]]- s/p radiation at [facility] on 7/29.  JUNE 2024- RS-23; recurrence < 9.   # #Discussed the mechanism of action of aromatase inhibitors-with blocking of estrogen to prevent breast cancer.  Also discussed the potential side effects including but not limited to arthralgias hot flashes and increased risk of osteoporosis. Script for Anastrozole.   # Bone health- BMD with PCP- next week   # Genetic counseling: no pathogenic mutations.   # DISPOSITION: # follow up in 6-8 weeks-MD: no labs- Dr.B

## 2023-08-02 NOTE — Progress Notes (Signed)
Pt states she had to stop 3 meds while doing radiation, can she start those back?

## 2023-08-09 ENCOUNTER — Ambulatory Visit
Admission: RE | Admit: 2023-08-09 | Discharge: 2023-08-09 | Disposition: A | Discharge status: Home / Self Care | Payer: Medicare Other | Source: Ambulatory Visit | Attending: Internal Medicine | Admitting: Internal Medicine

## 2023-08-09 DIAGNOSIS — M810 Age-related osteoporosis without current pathological fracture: Secondary | ICD-10-CM | POA: Diagnosis not present

## 2023-08-09 DIAGNOSIS — E2839 Other primary ovarian failure: Secondary | ICD-10-CM | POA: Insufficient documentation

## 2023-08-19 ENCOUNTER — Encounter: Payer: Self-pay | Admitting: Internal Medicine

## 2023-08-19 ENCOUNTER — Ambulatory Visit (INDEPENDENT_AMBULATORY_CARE_PROVIDER_SITE_OTHER): Payer: Medicare Other | Admitting: Internal Medicine

## 2023-08-19 VITALS — BP 128/70 | HR 91 | Temp 97.7°F | Resp 16 | Ht 62.0 in | Wt 122.0 lb

## 2023-08-19 DIAGNOSIS — Z170 Estrogen receptor positive status [ER+]: Secondary | ICD-10-CM

## 2023-08-19 DIAGNOSIS — F439 Reaction to severe stress, unspecified: Secondary | ICD-10-CM | POA: Diagnosis not present

## 2023-08-19 DIAGNOSIS — M810 Age-related osteoporosis without current pathological fracture: Secondary | ICD-10-CM

## 2023-08-19 DIAGNOSIS — E7800 Pure hypercholesterolemia, unspecified: Secondary | ICD-10-CM | POA: Diagnosis not present

## 2023-08-19 DIAGNOSIS — Z23 Encounter for immunization: Secondary | ICD-10-CM | POA: Diagnosis not present

## 2023-08-19 DIAGNOSIS — E559 Vitamin D deficiency, unspecified: Secondary | ICD-10-CM

## 2023-08-19 DIAGNOSIS — I10 Essential (primary) hypertension: Secondary | ICD-10-CM

## 2023-08-19 DIAGNOSIS — C50412 Malignant neoplasm of upper-outer quadrant of left female breast: Secondary | ICD-10-CM

## 2023-08-19 MED ORDER — ALENDRONATE SODIUM 70 MG PO TABS
70.0000 mg | ORAL_TABLET | ORAL | 11 refills | Status: DC
Start: 2023-08-19 — End: 2024-01-26

## 2023-08-19 MED ORDER — MUPIROCIN 2 % EX OINT: 1.0000 | TOPICAL_OINTMENT | Freq: Two times a day (BID) | CUTANEOUS | 0 refills | Status: DC

## 2023-08-19 NOTE — Progress Notes (Unsigned)
Subjective:    Patient ID: Jamie Elliott, female    DOB: Sep 18, 1946, 77 y.o.   MRN: [id number]  Patient here for  Chief Complaint  Patient presents with   Osteoporosis    HPI Here for a scheduled appt to discuss treatment for osteoporosis.  Discussed her recent bone density results.  Discussed osteoporosis.  Discussed treatment options - including oral bisphosphonates, reclast, prolia.  Discussed calcium and vitamin D.  She is currently being followed by oncology.     Past Medical History:  Diagnosis Date   Environmental allergies    Hypercholesterolemia    Hypertension    Migraines    Past Surgical History:  Procedure Laterality Date   ABDOMINAL HYSTERECTOMY  2000   APPENDECTOMY  2000   CATARACT EXTRACTION W/PHACO Left 04/23/2021   Procedure: CATARACT EXTRACTION PHACO AND INTRAOCULAR LENS PLACEMENT (IOC) LEFT panoptix toric 10.41 01:22.8 12.6%;  Surgeon: Lockie Mola, MD;  Location: [facility];  Service: Ophthalmology;  Laterality: Left;   CATARACT EXTRACTION W/PHACO Right 05/07/2021   Procedure: CATARACT EXTRACTION PHACO AND INTRAOCULAR LENS PLACEMENT (IOC) RIGHT  panoptix 12.85 01:33.8 13.7%;  Surgeon: Lockie Mola, MD;  Location: [facility];  Service: Ophthalmology;  Laterality: Right;   NOSE SURGERY  1967   SEPTOPLASTY     SHOULDER ARTHROSCOPY WITH OPEN ROTATOR CUFF REPAIR Right 12/05/2015   Procedure: SHOULDER ARTHROSCOPY WITH MINI OPEN ROTATOR CUFF REPAIR. DISTAL CLAVICLE EXCISION;  Surgeon: Juanell Fairly, MD;  Location: [facility];  Service: Orthopedics;  Laterality: Right;   Family History  Problem Relation Age of Onset   Heart disease Mother    Hyperlipidemia Mother    Hypertension Mother    Lung cancer Father        dx 91s   Hyperlipidemia Father    Hypertension Father    Prostate cancer Father        dx 36s   Breast cancer Maternal Aunt        dx 60s-70s   Breast cancer Maternal Grandmother        dx 90s   Bladder Cancer  Neg Hx    Kidney cancer Neg Hx    Social History   Socioeconomic History   Marital status: Married    Spouse name: Not on file   Number of children: 2   Years of education: Not on file   Highest education level: Bachelor's degree (e.g., BA, AB, BS)  Occupational History   Not on file  Tobacco Use   Smoking status: Never   Smokeless tobacco: Never  Vaping Use   Vaping status: Never Used  Substance and Sexual Activity   Alcohol use: No    Alcohol/week: 0.0 standard drinks of alcohol   Drug use: No   Sexual activity: Not Currently  Other Topics Concern   Not on file  Social History Narrative   Not on file   Social Determinants of Health   Financial Resource Strain: Low Risk  (03/15/2023)   Overall Financial Resource Strain (CARDIA)    Difficulty of Paying Living Expenses: Not hard at all  Food Insecurity: No Food Insecurity (03/15/2023)   Hunger Vital Sign    Worried About Running Out of Food in the Last Year: Never true    Ran Out of Food in the Last Year: Never true  Transportation Needs: No Transportation Needs (03/15/2023)   PRAPARE - Administrator, Civil Service (Medical): No    Lack of Transportation (Non-Medical): No  Physical Activity:  Sufficiently Active (03/15/2023)   Exercise Vital Sign    Days of Exercise per Week: 5 days    Minutes of Exercise per Session: 40 min  Stress: No Stress Concern Present (03/15/2023)   [employer] of Occupational Health - Occupational Stress Questionnaire    Feeling of Stress : Not at all  Social Connections: Socially Integrated (03/15/2023)   Social Connection and Isolation Panel [NHANES]    Frequency of Communication with Friends and Family: More than three times a week    Frequency of Social Gatherings with Friends and Family: Twice a week    Attends Religious Services: More than 4 times per year    Active Member of [employer] or Organizations: Yes    Attends Engineer, structural: More than 4 times per year     Marital Status: Married     Review of Systems     Objective:     BP 128/70   Pulse 91   Temp 97.7 F (36.5 C)   Resp 16   Ht 5\' 2"  (1.575 m)   Wt 122 lb (55.3 kg)   LMP 11/28/1998   SpO2 97%   BMI 22.31 kg/m  Wt Readings from Last 3 Encounters:  08/19/23 122 lb (55.3 kg)  08/02/23 125 lb 9.6 oz (57 kg)  07/06/23 124 lb (56.2 kg)    Physical Exam   Outpatient Encounter Medications as of 08/19/2023  Medication Sig   alendronate (FOSAMAX) 70 MG tablet Take 1 tablet (70 mg total) by mouth every 7 (seven) days. Take with a full glass of water on an empty stomach.   mupirocin ointment (BACTROBAN) 2 % Apply 1 Application topically 2 (two) times daily.   acetaminophen (TYLENOL) 500 MG tablet Take 500 mg by mouth every 6 (six) hours as needed.   amLODipine (NORVASC) 5 MG tablet Take 1 tablet (5 mg total) by mouth daily.   Ascorbic Acid (VITAMIN C) 1000 MG tablet Take 1,000 mg by mouth daily.   atorvastatin (LIPITOR) 40 MG tablet Take 1 tablet (40 mg total) by mouth daily.   cetirizine (ZYRTEC) 10 MG tablet Take 1 tablet (10 mg total) by mouth daily.   Cholecalciferol (VITAMIN D3) 25 MCG (1000 UT) CAPS Take 1 capsule by mouth daily.   CRANBERRY EXTRACT PO Take 1 capsule by mouth 2 (two) times daily.   D-Mannose 500 MG CAPS Take 500 mg by mouth in the morning and at bedtime.   hydrochlorothiazide (HYDRODIURIL) 12.5 MG tablet TAKE ONE TABLET DAILY.   losartan (COZAAR) 100 MG tablet Take 1 tablet (100 mg total) by mouth daily.   polyethylene glycol (MIRALAX / GLYCOLAX) 17 g packet Take 17 g by mouth every other day.   Probiotic Product (ALIGN PO) Take by mouth as needed.   triamcinolone (NASACORT) 55 MCG/ACT nasal inhaler Place 2 sprays into the nose as needed.   No facility-administered encounter medications on file as of 08/19/2023.     Lab Results  Component Value Date   WBC 6.0 04/07/2022   HGB 13.6 04/07/2022   HCT 40.0 04/07/2022   PLT 227.0 04/07/2022   GLUCOSE 86  05/31/2023   CHOL 179 05/31/2023   TRIG 185.0 (H) 05/31/2023   HDL 57.80 05/31/2023   LDLDIRECT 103.0 05/21/2015   LDLCALC 84 05/31/2023   ALT 15 07/01/2023   AST 16 07/01/2023   NA 141 05/31/2023   K 3.6 05/31/2023   CL 104 05/31/2023   CREATININE 0.72 05/31/2023   BUN 14 05/31/2023  CO2 29 05/31/2023   TSH 1.75 01/14/2023   INR 1.0 03/17/2022    DG Bone Density  Result Date: 08/09/2023 EXAM: DUAL X-RAY ABSORPTIOMETRY (DXA) FOR BONE MINERAL DENSITY IMPRESSION: Your patient Jamie Elliott completed a BMD test on 08/09/2023 using the [employer] DXA System (software version: 14.10) manufactured by [employer]. The following summarizes the results of our evaluation. Technologist:VLM PATIENT BIOGRAPHICAL: Name: Jamie Elliott, Jamie Elliott Patient ID: [id number] Birth Date: May 03, 1946 Height: 62.0 in. Gender: Female Exam Date: 08/09/2023 Weight: 125.6 lbs. Indications: Caucasian, History of Breast Cancer, Hysterectomy, Postmenopausal Fractures: Treatments: calcium w/ vit D DENSITOMETRY RESULTS: Site          Region     Measured Date Measured Age WHO Classification Young Adult T-score BMD         %Change vs. Previous Significant Change (*) Right Forearm Radius 33% 08/09/2023 [age] Oste[age] -1.6 0.737 g/cm2 - - DualFemur Total Left 08/09/2023 [age] Osteoporosis -2.6 0.686 g/cm2 - - ASSESSMENT: The BMD measured at Femur Total Left is 0.686 g/cm2 with a T-score of -2.6. This patient is considered osteoporotic according to World Health Organization [facility]) criteria. Lumbar spine was not utilized due to advanced degenerative changes and scoliosis. The scan quality is good. World Science writer [facility]) criteria for post-menopausal, Caucasian Women: Normal:                   T-score at or above -1 SD Osteopenia/low bone mass: T-score between -1 and -2.5 SD Osteoporosis:             T-score at or below -2.5 SD RECOMMENDATIONS: 1. All patients should optimize calcium and vitamin D intake. 2. Consider  FDA-approved medical therapies in postmenopausal women and men aged 2 years and older, based on the following: a. A hip or vertebral(clinical or morphometric) fracture b. T-score < -2.5 at the femoral neck or spine after appropriate evaluation to exclude secondary causes c. Low bone mass (T-score between -1.0 and -2.5 at the femoral neck or spine) and a 10-year probability of a hip fracture > 3% or a 10-year probability of a major osteoporosis-related fracture > 20% based on the [place]-adapted WHO algorithm 3. Clinician judgment and/or patient preferences may indicate treatment for people with 10-year fracture probabilities above or below these levels FOLLOW-UP: People with diagnosed cases of osteoporosis or at high risk for fracture should have regular bone mineral density tests. For patients eligible for Medicare, routine testing is allowed once every 2 years. The testing frequency can be increased to one year for patients who have rapidly progressing disease, those who are receiving or discontinuing medical therapy to restore bone mass, or have additional risk factors. I have reviewed this report, and agree with the above findings. [facility] Electronically Signed   By: Frederico Hamman M.D.   On: 08/09/2023 14:57       Assessment & Plan:  There are no diagnoses linked to this encounter.   Dale Harper, MD

## 2023-08-21 NOTE — Progress Notes (Incomplete)
Subjective:    Patient ID: Jamie Elliott, female    DOB: [birth year], 77 y.o.   MRN: [id number]  Patient here for  Chief Complaint  Patient presents with  . Osteoporosis    HPI Here for a scheduled appt to discuss treatment for osteoporosis.  Discussed her recent bone density results.  Discussed osteoporosis.  Discussed treatment options - including oral bisphosphonates, reclast, prolia.  Discussed calcium and vitamin D.  She is currently being followed by oncology - breast cancer.  Discussed aromatase inhibitors.  S/p lumpectomy.  Persistent "knot" left breast.  Concerned regarding increased redness.  Not as sore now.     Past Medical History:  Diagnosis Date  . Environmental allergies   . Hypercholesterolemia   . Hypertension   . Migraines    Past Surgical History:  Procedure Laterality Date  . ABDOMINAL HYSTERECTOMY  2000  . APPENDECTOMY  2000  . CATARACT EXTRACTION W/PHACO Left 04/23/2021   Procedure: CATARACT EXTRACTION PHACO AND INTRAOCULAR LENS PLACEMENT (IOC) LEFT panoptix toric 10.41 01:22.8 12.6%;  Surgeon: Lockie Mola, MD;  Location: [facility];  Service: Ophthalmology;  Laterality: Left;  . CATARACT EXTRACTION W/PHACO Right 05/07/2021   Procedure: CATARACT EXTRACTION PHACO AND INTRAOCULAR LENS PLACEMENT (IOC) RIGHT  panoptix 12.85 01:33.8 13.7%;  Surgeon: Lockie Mola, MD;  Location: [facility];  Service: Ophthalmology;  Laterality: Right;  . NOSE SURGERY  1967  . SEPTOPLASTY    . SHOULDER ARTHROSCOPY WITH OPEN ROTATOR CUFF REPAIR Right 12/05/2015   Procedure: SHOULDER ARTHROSCOPY WITH MINI OPEN ROTATOR CUFF REPAIR. DISTAL CLAVICLE EXCISION;  Surgeon: Juanell Fairly, MD;  Location: [facility];  Service: Orthopedics;  Laterality: Right;   Family History  Problem Relation Age of Onset  . Heart disease Mother   . Hyperlipidemia Mother   . Hypertension Mother   . Lung cancer Father        dx 57s  . Hyperlipidemia Father   .  Hypertension Father   . Prostate cancer Father        dx 61s  . Breast cancer Maternal Aunt        dx 60s-70s  . Breast cancer Maternal Grandmother        dx 90s  . Bladder Cancer Neg Hx   . Kidney cancer Neg Hx    Social History   Socioeconomic History  . Marital status: Married    Spouse name: Not on file  . Number of children: 2  . Years of education: Not on file  . Highest education level: Bachelor's degree (e.g., BA, AB, BS)  Occupational History  . Not on file  Tobacco Use  . Smoking status: Never  . Smokeless tobacco: Never  Vaping Use  . Vaping status: Never Used  Substance and Sexual Activity  . Alcohol use: No    Alcohol/week: 0.0 standard drinks of alcohol  . Drug use: No  . Sexual activity: Not Currently  Other Topics Concern  . Not on file  Social History Narrative  . Not on file   Social Determinants of Health   Financial Resource Strain: Low Risk  (03/15/2023)   Overall Financial Resource Strain (CARDIA)   . Difficulty of Paying Living Expenses: Not hard at all  Food Insecurity: No Food Insecurity (03/15/2023)   Hunger Vital Sign   . Worried About Programme researcher, broadcasting/film/video in the Last Year: Never true   . Ran Out of Food in the Last Year: Never true  Transportation Needs: No Transportation Needs (  03/15/2023)   PRAPARE - Transportation   . Lack of Transportation (Medical): No   . Lack of Transportation (Non-Medical): No  Physical Activity: Sufficiently Active (03/15/2023)   Exercise Vital Sign   . Days of Exercise per Week: 5 days   . Minutes of Exercise per Session: 40 min  Stress: No Stress Concern Present (03/15/2023)   [employer] of Occupational Health - Occupational Stress Questionnaire   . Feeling of Stress : Not at all  Social Connections: Socially Integrated (03/15/2023)   Social Connection and Isolation Panel [NHANES]   . Frequency of Communication with Friends and Family: More than three times a week   . Frequency of Social Gatherings with  Friends and Family: Twice a week   . Attends Religious Services: More than 4 times per year   . Active Member of Clubs or Organizations: Yes   . Attends Banker Meetings: More than 4 times per year   . Marital Status: Married     Review of Systems  Constitutional:  Negative for appetite change and unexpected weight change.  HENT:  Negative for congestion and sinus pressure.   Respiratory:  Negative for cough, chest tightness and shortness of breath.   Cardiovascular:  Negative for chest pain and palpitations.       No increased swelling.   Gastrointestinal:  Negative for abdominal pain, diarrhea, nausea and vomiting.  Genitourinary:  Negative for difficulty urinating and dysuria.  Musculoskeletal:  Negative for joint swelling and myalgias.  Skin:  Negative for color change and rash.  Neurological:  Negative for dizziness and headaches.  Psychiatric/Behavioral:  Negative for agitation and dysphoric mood.        Objective:     BP 128/70   Pulse 91   Temp 97.7 F (36.5 C)   Resp 16   Ht 5\' 2"  (1.575 m)   Wt 122 lb (55.3 kg)   LMP 11/28/1998   SpO2 97%   BMI 22.31 kg/m  Wt Readings from Last 3 Encounters:  08/19/23 122 lb (55.3 kg)  08/02/23 125 lb 9.6 oz (57 kg)  07/06/23 124 lb (56.2 kg)    Physical Exam Vitals reviewed.  Constitutional:      General: She is not in acute distress.    Appearance: Normal appearance.  HENT:     Head: Normocephalic and atraumatic.     Right Ear: External ear normal.     Left Ear: External ear normal.  Eyes:     General: No scleral icterus.       Right eye: No discharge.        Left eye: No discharge.     Conjunctiva/sclera: Conjunctivae normal.  Neck:     Thyroid: No thyromegaly.  Cardiovascular:     Rate and Rhythm: Normal rate and regular rhythm.  Pulmonary:     Effort: No respiratory distress.     Breath sounds: Normal breath sounds. No wheezing.     Comments: Breast - Minimal erythema - scarring - s/p  lumpectomy.  Abdominal:     General: Bowel sounds are normal.     Palpations: Abdomen is soft.     Tenderness: There is no abdominal tenderness.  Musculoskeletal:        General: No swelling or tenderness.     Cervical back: Neck supple. No tenderness.  Lymphadenopathy:     Cervical: No cervical adenopathy.  Skin:    Findings: No erythema or rash.  Neurological:     Mental Status: She  is alert.  Psychiatric:        Mood and Affect: Mood normal.        Behavior: Behavior normal.      Outpatient Encounter Medications as of 08/19/2023  Medication Sig  . alendronate (FOSAMAX) 70 MG tablet Take 1 tablet (70 mg total) by mouth every 7 (seven) days. Take with a full glass of water on an empty stomach.  . mupirocin ointment (BACTROBAN) 2 % Apply 1 Application topically 2 (two) times daily.  Marland Kitchen acetaminophen (TYLENOL) 500 MG tablet Take 500 mg by mouth every 6 (six) hours as needed.  Marland Kitchen amLODipine (NORVASC) 5 MG tablet Take 1 tablet (5 mg total) by mouth daily.  . Ascorbic Acid (VITAMIN C) 1000 MG tablet Take 1,000 mg by mouth daily.  Marland Kitchen atorvastatin (LIPITOR) 40 MG tablet Take 1 tablet (40 mg total) by mouth daily.  . cetirizine (ZYRTEC) 10 MG tablet Take 1 tablet (10 mg total) by mouth daily.  . Cholecalciferol (VITAMIN D3) 25 MCG (1000 UT) CAPS Take 1 capsule by mouth daily.  Marland Kitchen CRANBERRY EXTRACT PO Take 1 capsule by mouth 2 (two) times daily.  . D-Mannose 500 MG CAPS Take 500 mg by mouth in the morning and at bedtime.  . hydrochlorothiazide (HYDRODIURIL) 12.5 MG tablet TAKE ONE TABLET DAILY.  Marland Kitchen losartan (COZAAR) 100 MG tablet Take 1 tablet (100 mg total) by mouth daily.  . polyethylene glycol (MIRALAX / GLYCOLAX) 17 g packet Take 17 g by mouth every other day.  . Probiotic Product (ALIGN PO) Take by mouth as needed.  . triamcinolone (NASACORT) 55 MCG/ACT nasal inhaler Place 2 sprays into the nose as needed.   No facility-administered encounter medications on file as of 08/19/2023.      Lab Results  Component Value Date   WBC 6.0 04/07/2022   HGB 13.6 04/07/2022   HCT 40.0 04/07/2022   PLT 227.0 04/07/2022   GLUCOSE 86 05/31/2023   CHOL 179 05/31/2023   TRIG 185.0 (H) 05/31/2023   HDL 57.80 05/31/2023   LDLDIRECT 103.0 05/21/2015   LDLCALC 84 05/31/2023   ALT 15 07/01/2023   AST 16 07/01/2023   NA 141 05/31/2023   K 3.6 05/31/2023   CL 104 05/31/2023   CREATININE 0.72 05/31/2023   BUN 14 05/31/2023   CO2 29 05/31/2023   TSH 1.75 01/14/2023   INR 1.0 03/17/2022    DG Bone Density  Result Date: 08/09/2023 EXAM: DUAL X-RAY ABSORPTIOMETRY (DXA) FOR BONE MINERAL DENSITY IMPRESSION: Your patient Ketsia Leaton completed a BMD test on 08/09/2023 using the [employer] DXA System (software version: 14.10) manufactured by [employer]. The following summarizes the results of our evaluation. Technologist:VLM PATIENT BIOGRAPHICAL: Name: Elexia, [place] Patient ID: [id number] Birth Date: 1946/12/19 Height: 62.0 in. Gender: Female Exam Date: 08/09/2023 Weight: 125.6 lbs. Indications: Caucasian, History of Breast Cancer, Hysterectomy, Postmenopausal Fractures: Treatments: calcium w/ vit D DENSITOMETRY RESULTS: Site          Region     Measured Date Measured Age WHO Classification Young Adult T-score BMD         %Change vs. Previous Significant Change (*) Right Forearm Radius 33% 08/09/2023 77.5 Oste[age] -1.6 0.737 g/cm2 - - DualFemur Total Left 8/09/2023 [age] Osteoporosis -2.6 0.686 g/cm2 - - ASSESSMENT: The BMD measured at Femur Total Left is 0.686 g/cm2 with a T-score of -2.6. This patient is considered osteoporotic according to World Health Organization [facility]) criteria. Lumbar spine was not utilized due to advanced degenerative  changes and scoliosis. The scan quality is good. World Science writer [facility]) criteria for post-menopausal, Caucasian Women: Normal:                   T-score at or above -1 SD Osteopenia/low bone mass: T-score between -1 and -2.5 SD  Osteoporosis:             T-score at or below -2.5 SD RECOMMENDATIONS: 1. All patients should optimize calcium and vitamin D intake. 2. Consider FDA-approved medical therapies in postmenopausal women and men aged 40 years and older, based on the following: a. A hip or vertebral(clinical or morphometric) fracture b. T-score < -2.5 at the femoral neck or spine after appropriate evaluation to exclude secondary causes c. Low bone mass (T-score between -1.0 and -2.5 at the femoral neck or spine) and a 10-year probability of a hip fracture > 3% or a 10-year probability of a major osteoporosis-related fracture > 20% based on the [place]-adapted WHO algorithm 3. Clinician judgment and/or patient preferences may indicate treatment for people with 10-year fracture probabilities above or below these levels FOLLOW-UP: People with diagnosed cases of osteoporosis or at high risk for fracture should have regular bone mineral density tests. For patients eligible for Medicare, routine testing is allowed once every 2 years. The testing frequency can be increased to one year for patients who have rapidly progressing disease, those who are receiving or discontinuing medical therapy to restore bone mass, or have additional risk factors. I have reviewed this report, and agree with the above findings. [facility] Electronically Signed   By: Frederico Hamman M.D.   On: 08/09/2023 14:57       Assessment & Plan:  Encounter for immunization -     Flu Vaccine Trivalent High Dose (Fluad)  Other orders -     Mupirocin; Apply 1 Application topically 2 (two) times daily.  Dispense: 22 g; Refill: 0 -     Alendronate Sodium; Take 1 tablet (70 mg total) by mouth every 7 (seven) days. Take with a full glass of water on an empty stomach.  Dispense: 4 tablet; Refill: 11     Dale Aspermont, MD

## 2023-08-22 NOTE — Assessment & Plan Note (Signed)
Vitamin D level wnl  - 12/2022.

## 2023-08-22 NOTE — Assessment & Plan Note (Signed)
Continue amlodipine and hctz.  Follow pressures.  Follow metabolic panel.  

## 2023-08-22 NOTE — Assessment & Plan Note (Signed)
Discussed her recent bone density results.  Discussed osteoporosis.  Discussed treatment options - including oral bisphosphonates, reclast, prolia.  Discussed calcium and vitamin D. Will start calcium and vitamin D.  Desired to start fosamax weekly.  Weight bearing exercise.

## 2023-08-22 NOTE — Assessment & Plan Note (Signed)
Appears to be handling things relatively well.  Follow.  

## 2023-08-22 NOTE — Assessment & Plan Note (Signed)
Continue lipitor.  Low cholesterol diet and exercise.  Follow lipid panel and liver function tests.   

## 2023-08-22 NOTE — Assessment & Plan Note (Signed)
Left breast stage I breast cancer ER/PR positive HER2 negative [s/p lumpectomy [facility]; Dr.Hwang] ; pT1b pSnLNx-M0- G-2. S/p left breast partial mastectomy 04/22/23. Had to go back for f/u surgery 05/20/23.  XRT. Breast lesion as outlined.  Bactroban as directed.  Follow.

## 2023-08-24 ENCOUNTER — Telehealth: Payer: Self-pay | Admitting: Internal Medicine

## 2023-08-24 NOTE — Telephone Encounter (Signed)
Pt called stating she need the provider to look at her infection again. Pt would like to be called

## 2023-08-25 NOTE — Telephone Encounter (Signed)
FYI- patient was given mupirocin to use on her lumpectomy incision while here last week and says that she does feel like it is getting better but still red. She says she would feel better if you looked at it. I put her down for Friday at 11 to be worked in for you to look at the incision. Pt was agreeable. Will continue to use bactroban until Friday.

## 2023-08-25 NOTE — Telephone Encounter (Signed)
Patient just called and would like for someone to call her concerning her infection again. She would like for someone to check it out.

## 2023-08-27 ENCOUNTER — Telehealth: Payer: Self-pay

## 2023-08-27 ENCOUNTER — Ambulatory Visit (INDEPENDENT_AMBULATORY_CARE_PROVIDER_SITE_OTHER): Payer: Medicare Other | Admitting: Internal Medicine

## 2023-08-27 ENCOUNTER — Encounter: Payer: Self-pay | Admitting: Internal Medicine

## 2023-08-27 VITALS — BP 118/72 | HR 76 | Temp 98.0°F | Ht 62.0 in | Wt 124.4 lb

## 2023-08-27 DIAGNOSIS — I10 Essential (primary) hypertension: Secondary | ICD-10-CM

## 2023-08-27 DIAGNOSIS — Z170 Estrogen receptor positive status [ER+]: Secondary | ICD-10-CM

## 2023-08-27 DIAGNOSIS — F439 Reaction to severe stress, unspecified: Secondary | ICD-10-CM | POA: Diagnosis not present

## 2023-08-27 DIAGNOSIS — C50412 Malignant neoplasm of upper-outer quadrant of left female breast: Secondary | ICD-10-CM | POA: Diagnosis not present

## 2023-08-27 NOTE — Telephone Encounter (Signed)
Patient states she saw Dr. Dale Advance today regarding an infection she has in her breast.  Patient states she has an appointment with her oncologist's Nurse Practitioner at [facility] on 09/01/2023 at 3pm.  Patient states Dr. Lorin Picket wanted her to let her know when she has the appointment scheduled.

## 2023-08-27 NOTE — Progress Notes (Unsigned)
Subjective:    Patient ID: Jamie Elliott, female    DOB: 03-21-1946, 77 y.o.   MRN: [id number]  Patient here for  Chief Complaint  Patient presents with   inicision check    HPI Here as a work in appt.  Work in - to evaluate incision site.  S/p left breast partial mastectomy 04/22/23. F/u surgery 05/20/23.  Had f/u last week.  Redness around incision site. Placed on bactroban. No worsening.  No open lesion.  Palpable fullness around incision. No fever.  No increased redness.  Eating.  No nausea or vomiting.  Breathing stable.    Past Medical History:  Diagnosis Date   Environmental allergies    Hypercholesterolemia    Hypertension    Migraines    Past Surgical History:  Procedure Laterality Date   ABDOMINAL HYSTERECTOMY  2000   APPENDECTOMY  2000   CATARACT EXTRACTION W/PHACO Left 04/23/2021   Procedure: CATARACT EXTRACTION PHACO AND INTRAOCULAR LENS PLACEMENT (IOC) LEFT panoptix toric 10.41 01:22.8 12.6%;  Surgeon: Lockie Mola, MD;  Location: [facility];  Service: Ophthalmology;  Laterality: Left;   CATARACT EXTRACTION W/PHACO Right 05/07/2021   Procedure: CATARACT EXTRACTION PHACO AND INTRAOCULAR LENS PLACEMENT (IOC) RIGHT  panoptix 12.85 01:33.8 13.7%;  Surgeon: Lockie Mola, MD;  Location: [facility];  Service: Ophthalmology;  Laterality: Right;   NOSE SURGERY  1967   SEPTOPLASTY     SHOULDER ARTHROSCOPY WITH OPEN ROTATOR CUFF REPAIR Right 12/05/2015   Procedure: SHOULDER ARTHROSCOPY WITH MINI OPEN ROTATOR CUFF REPAIR. DISTAL CLAVICLE EXCISION;  Surgeon: Juanell Fairly, MD;  Location: [facility];  Service: Orthopedics;  Laterality: Right;   Family History  Problem Relation Age of Onset   Heart disease Mother    Hyperlipidemia Mother    Hypertension Mother    Lung cancer Father        dx 63s   Hyperlipidemia Father    Hypertension Father    Prostate cancer Father        dx 31s   Breast cancer Maternal Aunt        dx 60s-70s   Breast  cancer Maternal Grandmother        dx 90s   Bladder Cancer Neg Hx    Kidney cancer Neg Hx    Social History   Socioeconomic History   Marital status: Married    Spouse name: Not on file   Number of children: 2   Years of education: Not on file   Highest education level: Bachelor's degree (e.g., BA, AB, BS)  Occupational History   Not on file  Tobacco Use   Smoking status: Never   Smokeless tobacco: Never  Vaping Use   Vaping status: Never Used  Substance and Sexual Activity   Alcohol use: No    Alcohol/week: 0.0 standard drinks of alcohol   Drug use: No   Sexual activity: Not Currently  Other Topics Concern   Not on file  Social History Narrative   Not on file   Social Determinants of Health   Financial Resource Strain: Low Risk  (03/15/2023)   Overall Financial Resource Strain (CARDIA)    Difficulty of Paying Living Expenses: Not hard at all  Food Insecurity: No Food Insecurity (03/15/2023)   Hunger Vital Sign    Worried About Running Out of Food in the Last Year: Never true    Ran Out of Food in the Last Year: Never true  Transportation Needs: No Transportation Needs (03/15/2023)   PRAPARE -  Administrator, Civil Service (Medical): No    Lack of Transportation (Non-Medical): No  Physical Activity: Sufficiently Active (03/15/2023)   Exercise Vital Sign    Days of Exercise per Week: 5 days    Minutes of Exercise per Session: 40 min  Stress: No Stress Concern Present (03/15/2023)   [employer] of Occupational Health - Occupational Stress Questionnaire    Feeling of Stress : Not at all  Social Connections: Socially Integrated (03/15/2023)   Social Connection and Isolation Panel [NHANES]    Frequency of Communication with Friends and Family: More than three times a week    Frequency of Social Gatherings with Friends and Family: Twice a week    Attends Religious Services: More than 4 times per year    Active Member of [employer] or Organizations: Yes     Attends Engineer, structural: More than 4 times per year    Marital Status: Married     Review of Systems  Constitutional:  Negative for appetite change and fever.  HENT:  Negative for congestion and sinus pressure.   Respiratory:  Negative for cough, chest tightness and shortness of breath.   Cardiovascular:  Negative for chest pain, palpitations and leg swelling.  Gastrointestinal:  Negative for abdominal pain, diarrhea, nausea and vomiting.  Musculoskeletal:  Negative for joint swelling and myalgias.  Skin:        Incision site - closed.  Minimal erythema.    Neurological:  Negative for dizziness and headaches.  Psychiatric/Behavioral:  Negative for agitation and dysphoric mood.        Objective:     BP 118/72   Pulse 76   Temp 98 F (36.7 C) (Oral)   Ht 5\' 2"  (1.575 m)   Wt 124 lb 6.4 oz (56.4 kg)   LMP 11/28/1998   SpO2 95%   BMI 22.75 kg/m  Wt Readings from Last 3 Encounters:  08/27/23 124 lb 6.4 oz (56.4 kg)  08/19/23 122 lb (55.3 kg)  08/02/23 125 lb 9.6 oz (57 kg)    Physical Exam Vitals reviewed.  Constitutional:      General: She is not in acute distress.    Appearance: Normal appearance.  HENT:     Head: Normocephalic and atraumatic.     Right Ear: External ear normal.     Left Ear: External ear normal.  Eyes:     General: No scleral icterus.       Right eye: No discharge.        Left eye: No discharge.     Conjunctiva/sclera: Conjunctivae normal.  Neck:     Thyroid: No thyromegaly.  Cardiovascular:     Rate and Rhythm: Normal rate and regular rhythm.  Pulmonary:     Effort: No respiratory distress.     Breath sounds: Normal breath sounds. No wheezing.     Comments: Breasts:  no nipple discharge or nipple retraction present.  No palpable nodules - right breast.  Incision (right breast) - closed.  Minimal erythema.  Question seroma - surrounding incision.  Minimal tenderness to palpation.  Abdominal:     General: Bowel sounds are  normal.     Palpations: Abdomen is soft.     Tenderness: There is no abdominal tenderness.  Musculoskeletal:        General: No swelling or tenderness.     Cervical back: Neck supple. No tenderness.  Lymphadenopathy:     Cervical: No cervical adenopathy.  Skin:    Findings:  No erythema or rash.  Neurological:     Mental Status: She is alert.  Psychiatric:        Mood and Affect: Mood normal.        Behavior: Behavior normal.      Outpatient Encounter Medications as of 08/27/2023  Medication Sig   acetaminophen (TYLENOL) 500 MG tablet Take 500 mg by mouth every 6 (six) hours as needed.   alendronate (FOSAMAX) 70 MG tablet Take 1 tablet (70 mg total) by mouth every 7 (seven) days. Take with a full glass of water on an empty stomach.   amLODipine (NORVASC) 5 MG tablet Take 1 tablet (5 mg total) by mouth daily.   Ascorbic Acid (VITAMIN C) 1000 MG tablet Take 1,000 mg by mouth daily.   atorvastatin (LIPITOR) 40 MG tablet Take 1 tablet (40 mg total) by mouth daily.   cetirizine (ZYRTEC) 10 MG tablet Take 1 tablet (10 mg total) by mouth daily.   Cholecalciferol (VITAMIN D3) 25 MCG (1000 UT) CAPS Take 1 capsule by mouth daily.   CRANBERRY EXTRACT PO Take 1 capsule by mouth 2 (two) times daily.   D-Mannose 500 MG CAPS Take 500 mg by mouth in the morning and at bedtime.   hydrochlorothiazide (HYDRODIURIL) 12.5 MG tablet TAKE ONE TABLET DAILY.   losartan (COZAAR) 100 MG tablet Take 1 tablet (100 mg total) by mouth daily.   mupirocin ointment (BACTROBAN) 2 % Apply 1 Application topically 2 (two) times daily.   polyethylene glycol (MIRALAX / GLYCOLAX) 17 g packet Take 17 g by mouth every other day.   Probiotic Product (ALIGN PO) Take by mouth as needed.   triamcinolone (NASACORT) 55 MCG/ACT nasal inhaler Place 2 sprays into the nose as needed.   No facility-administered encounter medications on file as of 08/27/2023.     Lab Results  Component Value Date   WBC 6.0 04/07/2022   HGB 13.6  04/07/2022   HCT 40.0 04/07/2022   PLT 227.0 04/07/2022   GLUCOSE 86 05/31/2023   CHOL 179 05/31/2023   TRIG 185.0 (H) 05/31/2023   HDL 57.80 05/31/2023   LDLDIRECT 103.0 05/21/2015   LDLCALC 84 05/31/2023   ALT 15 07/01/2023   AST 16 07/01/2023   NA 141 05/31/2023   K 3.6 05/31/2023   CL 104 05/31/2023   CREATININE 0.72 05/31/2023   BUN 14 05/31/2023   CO2 29 05/31/2023   TSH 1.75 01/14/2023   INR 1.0 03/17/2022    DG Bone Density  Result Date: 08/09/2023 EXAM: DUAL X-RAY ABSORPTIOMETRY (DXA) FOR BONE MINERAL DENSITY IMPRESSION: Your patient Baraa Labarr completed a BMD test on 08/09/2023 using the [employer] DXA System (software version: 14.10) manufactured by [employer]. The following summarizes the results of our evaluation. Technologist:VLM PATIENT BIOGRAPHICAL: Name: Ju, Agreda Patient ID: [id number] Birth Date: Jan 06, 1946 Height: 62.0 in. Gender: Female Exam Date: 08/09/2023 Weight: 125.6 lbs. Indications: Caucasian, History of Breast Cancer, Hysterectomy, Postmenopausal Fractures: Treatments: calcium w/ vit D DENSITOMETRY RESULTS: Site          Region     Measured Date Measured Age WHO Classification Young Adult T-score BMD         %Change vs. Previous Significant Change (*) Right Forearm Radius 33% 08/09/2023 77.5 Oste[age] -1.6 0.737 g/cm2 - - DualFemur Total Left 08/09/2023 77.5 Osteoporosis -2.6 0.686 g/cm2 - - ASSESSMENT: The BMD measured at Femur Total Left is 0.686 g/cm2 with a T-score of -2.6. This patient is considered osteoporotic according to World  Health Organization [facility]) criteria. Lumbar spine was not utilized due to advanced degenerative changes and scoliosis. The scan quality is good. World Science writer [facility]) criteria for post-menopausal, Caucasian Women: Normal:                   T-score at or above -1 SD Osteopenia/low bone mass: T-score between -1 and -2.5 SD Osteoporosis:             T-score at or below -2.5 SD RECOMMENDATIONS: 1. All  patients should optimize calcium and vitamin D intake. 2. Consider FDA-approved medical therapies in postmenopausal women and men aged 110 years and older, based on the following: a. A hip or vertebral(clinical or morphometric) fracture b. T-score < -2.5 at the femoral neck or spine after appropriate evaluation to exclude secondary causes c. Low bone mass (T-score between -1.0 and -2.5 at the femoral neck or spine) and a 10-year probability of a hip fracture > 3% or a 10-year probability of a major osteoporosis-related fracture > 20% based on the [place]-adapted WHO algorithm 3. Clinician judgment and/or patient preferences may indicate treatment for people with 10-year fracture probabilities above or below these levels FOLLOW-UP: People with diagnosed cases of osteoporosis or at high risk for fracture should have regular bone mineral density tests. For patients eligible for Medicare, routine testing is allowed once every 2 years. The testing frequency can be increased to one year for patients who have rapidly progressing disease, those who are receiving or discontinuing medical therapy to restore bone mass, or have additional risk factors. I have reviewed this report, and agree with the above findings. [facility] Electronically Signed   By: Frederico Hamman M.D.   On: 08/09/2023 14:57       Assessment & Plan:  Carcinoma of upper-outer quadrant of left breast in female, estrogen receptor positive (HCC) Assessment & Plan: Left breast stage I breast cancer ER/PR positive HER2 negative [s/p lumpectomy [facility]; Dr.Hwang] ; pT1b pSnLNx-M0- G-2. S/p left breast partial mastectomy 04/22/23. Had to go back for f/u surgery 05/20/23.  XRT.  Incision - closed.  Minimal erythema.  Discussed scarring. Seroma?.  Continue bactroban. Will have surgery reevaluate to determine if further intervention warranted.     Primary hypertension Assessment & Plan: Continue amlodipine and hctz.  Follow pressures.  Follow  metabolic panel.     Stress Assessment & Plan: Some increased stress related to above. Appears to be handling things relatively well.  Follow.        Dale , MD

## 2023-08-29 ENCOUNTER — Encounter: Payer: Self-pay | Admitting: Internal Medicine

## 2023-08-29 NOTE — Assessment & Plan Note (Signed)
Some increased stress related to above. Appears to be handling things relatively well.  Follow.

## 2023-08-29 NOTE — Assessment & Plan Note (Signed)
Left breast stage I breast cancer ER/PR positive HER2 negative [s/p lumpectomy [facility]; Dr.Hwang] ; pT1b pSnLNx-M0- G-2. S/p left breast partial mastectomy 04/22/23. Had to go back for f/u surgery 05/20/23.  XRT.  Incision - closed.  Minimal erythema.  Discussed scarring. Seroma?.  Continue bactroban. Will have surgery reevaluate to determine if further intervention warranted.

## 2023-08-29 NOTE — Assessment & Plan Note (Signed)
Continue amlodipine and hctz.  Follow pressures.  Follow metabolic panel.  

## 2023-08-30 NOTE — Telephone Encounter (Signed)
FYI for you. 

## 2023-09-01 DIAGNOSIS — L539 Erythematous condition, unspecified: Secondary | ICD-10-CM | POA: Diagnosis not present

## 2023-09-01 DIAGNOSIS — N61 Mastitis without abscess: Secondary | ICD-10-CM | POA: Diagnosis not present

## 2023-09-01 DIAGNOSIS — C50412 Malignant neoplasm of upper-outer quadrant of left female breast: Secondary | ICD-10-CM | POA: Diagnosis not present

## 2023-09-01 DIAGNOSIS — Z9889 Other specified postprocedural states: Secondary | ICD-10-CM | POA: Diagnosis not present

## 2023-09-01 DIAGNOSIS — T888XXA Other specified complications of surgical and medical care, not elsewhere classified, initial encounter: Secondary | ICD-10-CM | POA: Diagnosis not present

## 2023-09-01 DIAGNOSIS — Z170 Estrogen receptor positive status [ER+]: Secondary | ICD-10-CM | POA: Diagnosis not present

## 2023-09-01 DIAGNOSIS — M810 Age-related osteoporosis without current pathological fracture: Secondary | ICD-10-CM | POA: Diagnosis not present

## 2023-09-01 DIAGNOSIS — Z79899 Other long term (current) drug therapy: Secondary | ICD-10-CM | POA: Diagnosis not present

## 2023-09-01 DIAGNOSIS — Z923 Personal history of irradiation: Secondary | ICD-10-CM | POA: Diagnosis not present

## 2023-09-01 DIAGNOSIS — Y838 Other surgical procedures as the cause of abnormal reaction of the patient, or of later complication, without mention of misadventure at the time of the procedure: Secondary | ICD-10-CM | POA: Diagnosis not present

## 2023-09-01 DIAGNOSIS — N6489 Other specified disorders of breast: Secondary | ICD-10-CM | POA: Diagnosis not present

## 2023-09-05 ENCOUNTER — Other Ambulatory Visit: Payer: Self-pay | Admitting: Internal Medicine

## 2023-09-09 ENCOUNTER — Encounter: Payer: Self-pay | Admitting: *Deleted

## 2023-09-13 DIAGNOSIS — H43813 Vitreous degeneration, bilateral: Secondary | ICD-10-CM | POA: Diagnosis not present

## 2023-09-13 DIAGNOSIS — Z961 Presence of intraocular lens: Secondary | ICD-10-CM | POA: Diagnosis not present

## 2023-09-13 DIAGNOSIS — H40003 Preglaucoma, unspecified, bilateral: Secondary | ICD-10-CM | POA: Diagnosis not present

## 2023-09-20 ENCOUNTER — Ambulatory Visit: Payer: Medicare Other | Admitting: Internal Medicine

## 2023-09-21 ENCOUNTER — Ambulatory Visit: Payer: Medicare Other | Admitting: Internal Medicine

## 2023-09-22 DIAGNOSIS — M96843 Postprocedural seroma of a musculoskeletal structure following other procedure: Secondary | ICD-10-CM | POA: Diagnosis not present

## 2023-09-22 DIAGNOSIS — N61 Mastitis without abscess: Secondary | ICD-10-CM | POA: Diagnosis not present

## 2023-09-22 DIAGNOSIS — T888XXD Other specified complications of surgical and medical care, not elsewhere classified, subsequent encounter: Secondary | ICD-10-CM | POA: Diagnosis not present

## 2023-09-22 DIAGNOSIS — M810 Age-related osteoporosis without current pathological fracture: Secondary | ICD-10-CM | POA: Diagnosis not present

## 2023-09-22 DIAGNOSIS — C50412 Malignant neoplasm of upper-outer quadrant of left female breast: Secondary | ICD-10-CM | POA: Diagnosis not present

## 2023-09-24 ENCOUNTER — Encounter: Payer: Self-pay | Admitting: *Deleted

## 2023-09-24 ENCOUNTER — Encounter: Payer: Self-pay | Admitting: Internal Medicine

## 2023-09-24 ENCOUNTER — Inpatient Hospital Stay: Payer: Medicare Other | Attending: Internal Medicine | Admitting: Internal Medicine

## 2023-09-24 VITALS — BP 118/51 | HR 79 | Temp 97.4°F | Ht 62.0 in | Wt 125.4 lb

## 2023-09-24 DIAGNOSIS — M810 Age-related osteoporosis without current pathological fracture: Secondary | ICD-10-CM | POA: Insufficient documentation

## 2023-09-24 DIAGNOSIS — C50412 Malignant neoplasm of upper-outer quadrant of left female breast: Secondary | ICD-10-CM | POA: Diagnosis not present

## 2023-09-24 DIAGNOSIS — Z170 Estrogen receptor positive status [ER+]: Secondary | ICD-10-CM | POA: Diagnosis not present

## 2023-09-24 MED ORDER — ANASTROZOLE 1 MG PO TABS: 1.0000 mg | ORAL_TABLET | Freq: Every day | ORAL | 4 refills | Status: DC

## 2023-09-24 NOTE — Assessment & Plan Note (Addendum)
#   Left breast stage I breast cancer ER/PR positive HER2 negative [s/p lumpectomy [facility]; Dr.Hwang] ; pT1b pSnLNx-M0- G-2. S/p re-excision [[facility]]- s/p radiation at [facility] on 7/29/ 2024.  JUNE 2024- RS-23; recurrence < 9.   # plan to start adjuvant  Anastrozole [x 5 years; till AUG 2029]. #Discussed the mechanism of action of aromatase inhibitors-with blocking of estrogen to prevent breast cancer.  Also discussed the potential side effects including but not limited to arthralgias hot flashes and increased risk of osteoporosis [see below].  Prescription sent.  # OSTEOPOROSIS-AUG 2024- BMD [Dr.Scott; ]  T-score of -2.6. Discussed the potential risk factors for osteoporosis- age/gender/postmenopausal status/use of anti-estrogen treatments. Discussed multiple options including exercise/ calcium and vitamin D supplementation/ and also use of bisphosphonates. Discussed oral bisphosphonates versus parenteral bisphosphonate like Reclast.Currently on Fosomax- to inform us if reflux or if BMD does not improve in 2026.and then might consider reclast  # Recent Breast infection [s/p lumpectomy]- s/p RT- s/p anti-biotics- defer follow up to [facility] surgery.  Recommend icing for now.  # Genetic counseling: no pathogenic mutations.   # Vaccination; recommend Flu shots/COVID-   # DISPOSITION: # follow up in 2 months; No labs-Dr.B

## 2023-09-24 NOTE — Progress Notes (Signed)
C/o of soreness left breast, 4/10.

## 2023-09-24 NOTE — Progress Notes (Signed)
[facility] CONSULT NOTE  Patient Care Team: Dale Mingoville, MD as PCP - General (Internal Medicine) Hulen Luster, RN as Oncology Nurse Navigator Earna Coder, MD as Consulting Physician (Oncology)  CHIEF COMPLAINTS/PURPOSE OF CONSULTATION: Breast cancer  #  Oncology History Overview Note   [facility]- MARCH 2024-  There is a subtle 0.8 cm x 0.6 cm x 0.5 cm  irregularly shaped,  Invasive adenocarcinoma of the breast (7 mm on core). Histologic type: Ductal with lobular features Provisional Nottingham combined histologic grade: 2 of 3 Tubule formation score: 3 Nuclear pleomorphism score: 2 Mitotic rate score:  1   Ductal carcinoma in situ (DCIS): Present Type of in-situ carcinoma: Solid and cribriform Nuclear grade of in-situ carcinoma: 2  MARGINS   Margin Status for Invasive Carcinoma:    All margins negative for invasive carcinoma     Distance from Invasive Carcinoma to Closest Margin:    Less than: 1 mm     Closest Margin(s) to Invasive Carcinoma:    Medial   Margin Status for DCIS:    All margins negative for DCIS     Distance from DCIS to Closest Margin:    Greater than: 5 mm     Closest Margin(s) to DCIS:    all margins   REGIONAL LYMPH NODES   Regional Lymph Node Status:    Not applicable (no regional lymph nodes submitted or found)     pT Category:    pT1b   pN Category:    pN not assigned (no nodes submitted or found)   SPECIAL STUDIES     Estrogen Receptor (ER) Status:    Positive (greater than 10% of cells demonstrate nuclear positivity)       Percentage of Cells with Nuclear Positivity:    99 %     Progesterone Receptor (PgR) Status:    Positive       Percentage of Cells with Nuclear Positivity:    64 %     HER2 (by immunohistochemistry):    Negative (Score 1+)   # OCT 2024- plan to start adjuvant  Anastrozole [x 5 years; till  OCT 2029]   Carcinoma of upper-outer quadrant of left breast in female, estrogen receptor positive (HCC)  03/22/2023  Initial Diagnosis   Carcinoma of upper-outer quadrant of left breast in female, estrogen receptor positive   03/22/2023 Cancer Staging   Staging form: Breast, AJCC 8th Edition - Clinical: Stage IA (cT1b, cN0, cM0, G2, ER+, PR+, HER2-) - Signed by Earna Coder, MD on 03/22/2023 Histologic grading system: 3 grade system    Genetic Testing   Negative genetic testing. No pathogenic variants identified on the [facility] Multi-Cancer+RNA panel. The report date is 05/23/2023.  The Multi-Cancer + RNA Panel offered by [facility] includes sequencing and/or deletion/duplication analysis of the following 70 genes:  AIP*, ALK, APC*, ATM*, AXIN2*, BAP1*, BARD1*, BLM*, BMPR1A*, BRCA1*, BRCA2*, BRIP1*, CDC73*, CDH1*, CDK4, CDKN1B*, CDKN2A, CHEK2*, CTNNA1*, DICER1*, EPCAM, EGFR, FH*, FLCN*, GREM1, HOXB13, KIT, LZTR1, MAX*, MBD4, MEN1*, MET, MITF, MLH1*, MSH2*, MSH3*, MSH6*, MUTYH*, NF1*, NF2*, NTHL1*, PALB2*, PDGFRA, PMS2*, POLD1*, POLE*, POT1*, PRKAR1A*, PTCH1*, PTEN*, RAD51C*, RAD51D*, RB1*, RET, SDHA*, SDHAF2*, SDHB*, SDHC*, SDHD*, SMAD4*, SMARCA4*, SMARCB1*, SMARCE1*, STK11*, SUFU*, TMEM127*, TP53*, TSC1*, TSC2*, VHL*. RNA analysis is performed for * genes.'    HISTORY OF PRESENTING ILLNESS: Patient ambulating-independently.Accompanied by family-husband.   Jamie Elliott 77 y.o.  female patient with left  breast cancer stage I ER/PR positive HER2 negative status postlumpectomy [[facility]] is  here for follow-up.  Pt finish radiation at [facility] on 7/29.     In the interim patient had episode of infection of the breast needing antibiotics.  She also had ultrasound of the breast done at [facility] with the surgeon.  She had a reevaluation again yesterday.  Complains of mild soreness in the left breast.  Otherwise denies any lumps or bumps. Review of Systems  Constitutional:  Negative for chills, diaphoresis, fever, malaise/fatigue and weight loss.  HENT:  Negative for nosebleeds and sore throat.   Eyes:  Negative for double  vision.  Respiratory:  Negative for cough, hemoptysis, sputum production, shortness of breath and wheezing.   Cardiovascular:  Negative for chest pain, palpitations, orthopnea and leg swelling.  Gastrointestinal:  Negative for abdominal pain, blood in stool, constipation, diarrhea, heartburn, melena, nausea and vomiting.  Genitourinary:  Negative for dysuria, frequency and urgency.  Musculoskeletal:  Negative for back pain and joint pain.  Skin: Negative.  Negative for itching and rash.  Neurological:  Negative for dizziness, tingling, focal weakness, weakness and headaches.  Endo/Heme/Allergies:  Does not bruise/bleed easily.  Psychiatric/Behavioral:  Negative for depression. The patient is not nervous/anxious and does not have insomnia.      MEDICAL HISTORY:  Past Medical History:  Diagnosis Date   Environmental allergies    Hypercholesterolemia    Hypertension    Migraines     SURGICAL HISTORY: Past Surgical History:  Procedure Laterality Date   ABDOMINAL HYSTERECTOMY  2000   APPENDECTOMY  2000   CATARACT EXTRACTION W/PHACO Left 04/23/2021   Procedure: CATARACT EXTRACTION PHACO AND INTRAOCULAR LENS PLACEMENT (IOC) LEFT panoptix toric 10.41 01:22.8 12.6%;  Surgeon: Lockie Mola, MD;  Location: [facility];  Service: Ophthalmology;  Laterality: Left;   CATARACT EXTRACTION W/PHACO Right 05/07/2021   Procedure: CATARACT EXTRACTION PHACO AND INTRAOCULAR LENS PLACEMENT (IOC) RIGHT  panoptix 12.85 01:33.8 13.7%;  Surgeon: Lockie Mola, MD;  Location: [facility];  Service: Ophthalmology;  Laterality: Right;   NOSE SURGERY  1967   SEPTOPLASTY     SHOULDER ARTHROSCOPY WITH OPEN ROTATOR CUFF REPAIR Right 12/05/2015   Procedure: SHOULDER ARTHROSCOPY WITH MINI OPEN ROTATOR CUFF REPAIR. DISTAL CLAVICLE EXCISION;  Surgeon: Juanell Fairly, MD;  Location: [facility];  Service: Orthopedics;  Laterality: Right;    SOCIAL HISTORY: Social History   Socioeconomic  History   Marital status: Married    Spouse name: Not on file   Number of children: 2   Years of education: Not on file   Highest education level: Bachelor's degree (e.g., BA, AB, BS)  Occupational History   Not on file  Tobacco Use   Smoking status: Never   Smokeless tobacco: Never  Vaping Use   Vaping status: Never Used  Substance and Sexual Activity   Alcohol use: No    Alcohol/week: 0.0 standard drinks of alcohol   Drug use: No   Sexual activity: Not Currently  Other Topics Concern   Not on file  Social History Narrative   Not on file   Social Determinants of Health   Financial Resource Strain: Low Risk  (03/15/2023)   Overall Financial Resource Strain (CARDIA)    Difficulty of Paying Living Expenses: Not hard at all  Food Insecurity: No Food Insecurity (03/15/2023)   Hunger Vital Sign    Worried About Running Out of Food in the Last Year: Never true    Ran Out of Food in the Last Year: Never true  Transportation Needs: No Transportation Needs (03/15/2023)  PRAPARE - Administrator, Civil Service (Medical): No    Lack of Transportation (Non-Medical): No  Physical Activity: Sufficiently Active (03/15/2023)   Exercise Vital Sign    Days of Exercise per Week: 5 days    Minutes of Exercise per Session: 40 min  Stress: No Stress Concern Present (03/15/2023)   [employer] of Occupational Health - Occupational Stress Questionnaire    Feeling of Stress : Not at all  Social Connections: Socially Integrated (03/15/2023)   Social Connection and Isolation Panel [NHANES]    Frequency of Communication with Friends and Family: More than three times a week    Frequency of Social Gatherings with Friends and Family: Twice a week    Attends Religious Services: More than 4 times per year    Active Member of [employer] or Organizations: Yes    Attends Engineer, structural: More than 4 times per year    Marital Status: Married  Catering manager Violence: Not At Risk  (01/04/2023)   Humiliation, Afraid, Rape, and Kick questionnaire    Fear of Current or Ex-Partner: No    Emotionally Abused: No    Physically Abused: No    Sexually Abused: No    FAMILY HISTORY: Family History  Problem Relation Age of Onset   Heart disease Mother    Hyperlipidemia Mother    Hypertension Mother    Lung cancer Father        dx 71s   Hyperlipidemia Father    Hypertension Father    Prostate cancer Father        dx 70s   Breast cancer Maternal Aunt        dx 60s-70s   Breast cancer Maternal Grandmother        dx 90s   Bladder Cancer Neg Hx    Kidney cancer Neg Hx     ALLERGIES:  is allergic to ciprofloxacin, zithromax [azithromycin], augmentin [amoxicillin-pot clavulanate], and doxycycline.  MEDICATIONS:  Current Outpatient Medications  Medication Sig Dispense Refill   acetaminophen (TYLENOL) 500 MG tablet Take 500 mg by mouth every 6 (six) hours as needed.     alendronate (FOSAMAX) 70 MG tablet Take 1 tablet (70 mg total) by mouth every 7 (seven) days. Take with a full glass of water on an empty stomach. 4 tablet 11   amLODipine (NORVASC) 5 MG tablet Take 1 tablet (5 mg total) by mouth daily. 90 tablet 2   anastrozole (ARIMIDEX) 1 MG tablet Take 1 tablet (1 mg total) by mouth daily. 30 tablet 4   Ascorbic Acid (VITAMIN C) 1000 MG tablet Take 1,000 mg by mouth daily.     atorvastatin (LIPITOR) 40 MG tablet Take 1 tablet (40 mg total) by mouth daily. 90 tablet 3   cetirizine (ZYRTEC) 10 MG tablet Take 1 tablet (10 mg total) by mouth daily. 90 tablet 3   Cholecalciferol (VITAMIN D3) 25 MCG (1000 UT) CAPS Take 1 capsule by mouth daily.     CRANBERRY EXTRACT PO Take 1 capsule by mouth 2 (two) times daily.     D-Mannose 500 MG CAPS Take 500 mg by mouth in the morning and at bedtime.     hydrochlorothiazide (HYDRODIURIL) 12.5 MG tablet TAKE ONE TABLET DAILY. 90 tablet 0   losartan (COZAAR) 100 MG tablet Take 1 tablet (100 mg total) by mouth daily. 90 tablet 1    mupirocin ointment (BACTROBAN) 2 % Apply 1 Application topically 2 (two) times daily. 22 g 0   polyethylene  glycol (MIRALAX / GLYCOLAX) 17 g packet Take 17 g by mouth every other day.     Probiotic Product (ALIGN PO) Take by mouth as needed.     triamcinolone (NASACORT) 55 MCG/ACT nasal inhaler Place 2 sprays into the nose as needed. 1 Inhaler 5   No current facility-administered medications for this visit.     PHYSICAL EXAMINATION:   Vitals:   09/24/23 1030  BP: (!) 118/51  Pulse: 79  Temp: (!) 97.4 F (36.3 C)  SpO2: 100%      Filed Weights   09/24/23 1030  Weight: 125 lb 6.4 oz (56.9 kg)       Physical Exam Vitals and nursing note reviewed.  HENT:     Head: Normocephalic and atraumatic.     Mouth/Throat:     Pharynx: Oropharynx is clear.  Eyes:     Extraocular Movements: Extraocular movements intact.     Pupils: Pupils are equal, round, and reactive to light.  Cardiovascular:     Rate and Rhythm: Normal rate and regular rhythm.  Pulmonary:     Comments: Decreased breath sounds bilaterally.  Abdominal:     Palpations: Abdomen is soft.  Musculoskeletal:        General: Normal range of motion.     Cervical back: Normal range of motion.  Skin:    General: Skin is warm.  Neurological:     General: No focal deficit present.     Mental Status: She is alert and oriented to person, place, and time.  Psychiatric:        Behavior: Behavior normal.        Judgment: Judgment normal.      LABORATORY DATA:  I have reviewed the data as listed Lab Results  Component Value Date   WBC 6.0 04/07/2022   HGB 13.6 04/07/2022   HCT 40.0 04/07/2022   MCV 91.7 04/07/2022   PLT 227.0 04/07/2022   Recent Labs    10/16/22 0800 01/14/23 0815 05/31/23 1013 07/01/23 0840  NA 141 142 141  --   K 3.9 3.8 3.6  --   CL 104 105 104  --   CO2 29 29 29   --   GLUCOSE 80 88 86  --   BUN 18 17 14   --   CREATININE 0.73 0.69 0.72  --   CALCIUM 10.0 9.8 9.8  --   PROT 6.1  6.1 6.4 6.2  ALBUMIN 4.0 4.0 4.0 3.9  AST 15 16 26 16   ALT 18 16 39* 15  ALKPHOS 85 83 93 81  BILITOT 0.6 0.5 0.5 0.7  BILIDIR 0.1 0.1 0.1 0.1    RADIOGRAPHIC STUDIES: I have personally reviewed the radiological images as listed and agreed with the findings in the report. No results found.  ASSESSMENT & PLAN:   Carcinoma of upper-outer quadrant of left breast in female, estrogen receptor positive (HCC) # Left breast stage I breast cancer ER/PR positive HER2 negative [s/p lumpectomy [facility]; Dr.Hwang] ; pT1b pSnLNx-M0- G-2. S/p re-excision [[facility]]- s/p radiation at [facility] on 7/29/ 2024.  JUNE 2024- RS-23; recurrence < 9.   # plan to start adjuvant  Anastrozole [x 5 years; till AUG 2029]. #Discussed the mechanism of action of aromatase inhibitors-with blocking of estrogen to prevent breast cancer.  Also discussed the potential side effects including but not limited to arthralgias hot flashes and increased risk of osteoporosis [see below].  Prescription sent.  # OSTEOPOROSIS-AUG 2024- BMD [Dr.Scott; ]  T-score of -2.6. Discussed the  potential risk factors for osteoporosis- age/gender/postmenopausal status/use of anti-estrogen treatments. Discussed multiple options including exercise/ calcium and vitamin D supplementation/ and also use of bisphosphonates. Discussed oral bisphosphonates versus parenteral bisphosphonate like Reclast.Currently on Fosomax- to inform us if reflux or if BMD does not improve in 2026.and then might consider reclast  # Recent Breast infection [s/p lumpectomy]- s/p RT- s/p anti-biotics- defer follow up to [facility] surgery.  Recommend icing for now.  # Genetic counseling: no pathogenic mutations.   # Vaccination; recommend Flu shots/COVID-   # DISPOSITION: # follow up in 2 months; No labs-Dr.B  All questions were answered. The patient/family knows to call the clinic with any problems, questions or concerns.    Earna Coder, MD 09/24/2023 12:06 PM

## 2023-10-04 ENCOUNTER — Ambulatory Visit: Payer: Medicare Other | Admitting: Internal Medicine

## 2023-10-04 ENCOUNTER — Encounter: Payer: Self-pay | Admitting: Internal Medicine

## 2023-10-04 VITALS — BP 114/70 | HR 88 | Temp 98.0°F | Resp 16 | Ht 62.0 in | Wt 123.8 lb

## 2023-10-04 DIAGNOSIS — N61 Mastitis without abscess: Secondary | ICD-10-CM | POA: Insufficient documentation

## 2023-10-04 DIAGNOSIS — F439 Reaction to severe stress, unspecified: Secondary | ICD-10-CM

## 2023-10-04 DIAGNOSIS — M810 Age-related osteoporosis without current pathological fracture: Secondary | ICD-10-CM

## 2023-10-04 DIAGNOSIS — I10 Essential (primary) hypertension: Secondary | ICD-10-CM | POA: Diagnosis not present

## 2023-10-04 DIAGNOSIS — E7800 Pure hypercholesterolemia, unspecified: Secondary | ICD-10-CM

## 2023-10-04 DIAGNOSIS — C50412 Malignant neoplasm of upper-outer quadrant of left female breast: Secondary | ICD-10-CM

## 2023-10-04 DIAGNOSIS — Z170 Estrogen receptor positive status [ER+]: Secondary | ICD-10-CM

## 2023-10-04 MED ORDER — SULFAMETHOXAZOLE-TRIMETHOPRIM 800-160 MG PO TABS: 1.0000 | ORAL_TABLET | Freq: Two times a day (BID) | ORAL | 0 refills | Status: DC

## 2023-10-04 NOTE — Assessment & Plan Note (Signed)
Continue lipitor.  Low cholesterol diet and exercise.  Follow lipid panel and liver function tests.

## 2023-10-04 NOTE — Assessment & Plan Note (Signed)
Increased stress as outlined.  Discussed.  Does not feel needs any further intervention at this time.  Follow.

## 2023-10-04 NOTE — Assessment & Plan Note (Signed)
Recently treated with improvement. Noticed increased erythema after stopping.  Bactrim x 1 week. Follow.  Calll with update.

## 2023-10-04 NOTE — Assessment & Plan Note (Signed)
Left breast stage I breast cancer ER/PR positive HER2 negative [s/p lumpectomy [facility]; Dr.Hwang] ; pT1b pSnLNx-M0- G-2. S/p left breast partial mastectomy 04/22/23. Had to go back for f/u surgery 05/20/23.  XRT.  Incision - closed.  Minimal erythema.  Discussed scarring. Recently treated for cellulitis.  Improved when on abx.  Increased erythema today.  Bactrim x 1 week as directed. Follow.  Started anastrozole.

## 2023-10-04 NOTE — Assessment & Plan Note (Signed)
Continue calcium and vitamin D.  fosamax weekly.  Weight bearing exercise.

## 2023-10-04 NOTE — Assessment & Plan Note (Signed)
Continue amlodipine and hctz.  Follow pressures.  Follow metabolic panel.

## 2023-10-04 NOTE — Progress Notes (Signed)
Subjective:    Patient ID: Jamie Elliott, female    DOB: 10-06-1946, 77 y.o.   MRN: [id number]  Patient here for  Chief Complaint  Patient presents with   Medical Management of Chronic Issues    HPI Here for a scheduled follow up.  Recently diagnosed with breast cancer. Is s/p left breast partial mastectomy 04/22/23, with f/u surgery 05/20/23. XRT. Was seen by surgery 09/01/23 - left breast cellulitis with draining seroma.  Placed on bactrim. Had f/u 09/22/23 - drainage site closed and no evidence of infection. Plan f/u 05/2024. Had f/u with Dr Donneta Romberg 09/24/23 - recommended starting anastrozole. On fosamax.  Reports persistent increased soreness left breast.  Increased redness.  Improved right after abx, but has noticed more redness since stopping.  No fever.  Eating and drinking ok.  No nausea or vomiting.  No abdominal pain or bowel change.  Tolerated the abx without problems.  Increased stress - family stress. Discussed. Does not feel needs any further intervention at this time.    Past Medical History:  Diagnosis Date   Environmental allergies    Hypercholesterolemia    Hypertension    Migraines    Past Surgical History:  Procedure Laterality Date   ABDOMINAL HYSTERECTOMY  2000   APPENDECTOMY  2000   CATARACT EXTRACTION W/PHACO Left 04/23/2021   Procedure: CATARACT EXTRACTION PHACO AND INTRAOCULAR LENS PLACEMENT (IOC) LEFT panoptix toric 10.41 01:22.8 12.6%;  Surgeon: Lockie Mola, MD;  Location: [facility];  Service: Ophthalmology;  Laterality: Left;   CATARACT EXTRACTION W/PHACO Right 05/07/2021   Procedure: CATARACT EXTRACTION PHACO AND INTRAOCULAR LENS PLACEMENT (IOC) RIGHT  panoptix 12.85 01:33.8 13.7%;  Surgeon: Lockie Mola, MD;  Location: [facility];  Service: Ophthalmology;  Laterality: Right;   NOSE SURGERY  1967   SEPTOPLASTY     SHOULDER ARTHROSCOPY WITH OPEN ROTATOR CUFF REPAIR Right 12/05/2015   Procedure: SHOULDER ARTHROSCOPY WITH MINI  OPEN ROTATOR CUFF REPAIR. DISTAL CLAVICLE EXCISION;  Surgeon: Juanell Fairly, MD;  Location: [facility];  Service: Orthopedics;  Laterality: Right;   Family History  Problem Relation Age of Onset   Heart disease Mother    Hyperlipidemia Mother    Hypertension Mother    Lung cancer Father        dx 84s   Hyperlipidemia Father    Hypertension Father    Prostate cancer Father        dx 54s   Breast cancer Maternal Aunt        dx 60s-70s   Breast cancer Maternal Grandmother        dx 90s   Bladder Cancer Neg Hx    Kidney cancer Neg Hx    Social History   Socioeconomic History   Marital status: Married    Spouse name: Not on file   Number of children: 2   Years of education: Not on file   Highest education level: Bachelor's degree (e.g., BA, AB, BS)  Occupational History   Not on file  Tobacco Use   Smoking status: Never   Smokeless tobacco: Never  Vaping Use   Vaping status: Never Used  Substance and Sexual Activity   Alcohol use: No    Alcohol/week: 0.0 standard drinks of alcohol   Drug use: No   Sexual activity: Not Currently  Other Topics Concern   Not on file  Social History Narrative   Not on file   Social Determinants of Health   Financial Resource Strain: Low Risk  (  The following summarizes the results of our evaluation. Technologist:VLM PATIENT BIOGRAPHICAL: Name: Selen, Smucker Patient ID: [id number] Birth Date: 03-Aug-1946 Height: 62.0 in. Gender: Female Exam Date: 08/09/2023 Weight: 125.6 lbs. Indications: Caucasian, History of Breast Cancer, Hysterectomy, Postmenopausal Fractures: Treatments: calcium w/ vit D DENSITOMETRY RESULTS: Site          Region     Measured Date  Measured Age WHO Classification Young Adult T-score BMD         %Change vs. Previous Significant Change (*) Right Forearm Radius 33% 08/09/20[age].5 Osteopenia -1.6 0.737 g/cm2 - - DualFemur Total Left 8/09/2023 77.5 Osteoporosis -2.6 0.686 g/cm2 - - ASSESSMENT: The BMD measured at Femur Total Left is 0.686 g/cm2 with a T-score of -2.6. This patient is considered osteoporotic according to World Health Organization [facility]) criteria. Lumbar spine was not utilized due to advanced degenerative changes and scoliosis. The scan quality is good. World Science writer [facility]) criteria for post-menopausal, Caucasian Women: Normal:                   T-score at or above -1 SD Osteopenia/low bone mass: T-score between -1 and -2.5 SD Osteoporosis:             T-score at or below -2.5 SD RECOMMENDATIONS: 1. All patients should optimize calcium and vitamin D intake. 2. Consider FDA-approved medical therapies in postmenopausal women and men aged 53 years and older, based on the following: a. A hip or vertebral(clinical or morphometric) fracture b. T-score < -2.5 at the femoral neck or spine after appropriate evaluation to exclude secondary causes c. Low bone mass (T-score between -1.0 and -2.5 at the femoral neck or spine) and a 10-year probability of a hip fracture > 3% or a 10-year probability of a major osteoporosis-related fracture > 20% based on the [place]-adapted WHO algorithm 3. Clinician judgment and/or patient preferences may indicate treatment for people with 10-year fracture probabilities above or below these levels FOLLOW-UP: People with diagnosed cases of osteoporosis or at high risk for fracture should have regular bone mineral density tests. For patients eligible for Medicare, routine testing is allowed once every 2 years. The testing frequency can be increased to one year for patients who have rapidly progressing disease, those who are receiving or discontinuing medical therapy to restore bone mass, or have additional  risk factors. I have reviewed this report, and agree with the above findings. [facility] Electronically Signed   By: Frederico Hamman M.D.   On: 08/09/2023 14:57       Assessment & Plan:  Hypercholesterolemia Assessment & Plan: Continue lipitor.  Low cholesterol diet and exercise.  Follow lipid panel and liver function tests.   Orders: -     CBC with Differential/Platelet; Future -     Hepatic function panel; Future -     Lipid panel; Future  Primary hypertension Assessment & Plan: Continue amlodipine and hctz.  Follow pressures.  Follow metabolic panel.    Orders: -     Basic metabolic panel; Future  Stress Assessment & Plan: Increased stress as outlined. Discussed. Does not feel needs any further intervention at this time. Follow.     Osteoporosis without current pathological fracture, unspecified osteoporosis type Assessment & Plan: Continue calcium and vitamin D.  fosamax weekly.  Weight bearing exercise.    Carcinoma of upper-outer quadrant of left breast in female, estrogen receptor positive (HCC) Assessment & Plan: Left breast stage I breast cancer ER/PR positive HER2 negative [s/p lumpectomy [facility]; Dr.Hwang] ;  The following summarizes the results of our evaluation. Technologist:VLM PATIENT BIOGRAPHICAL: Name: Selen, Smucker Patient ID: [id number] Birth Date: 03-Aug-1946 Height: 62.0 in. Gender: Female Exam Date: 08/09/2023 Weight: 125.6 lbs. Indications: Caucasian, History of Breast Cancer, Hysterectomy, Postmenopausal Fractures: Treatments: calcium w/ vit D DENSITOMETRY RESULTS: Site          Region     Measured Date  Measured Age WHO Classification Young Adult T-score BMD         %Change vs. Previous Significant Change (*) Right Forearm Radius 33% 08/09/2023 77.5 Osteopenia -1.6 0.737 g/cm2 - - DualFemur Total Left 08/09/2023 77.5 Osteoporosis -2.6 0.686 g/cm2 - - ASSESSMENT: The BMD measured at Femur Total Left is 0.686 g/cm2 with a T-score of -2.6. This patient is considered osteoporotic according to World Health Organization [facility]) criteria. Lumbar spine was not utilized due to advanced degenerative changes and scoliosis. The scan quality is good. World Science writer [facility]) criteria for post-menopausal, Caucasian Women: Normal:                   T-score at or above -1 SD Osteopenia/low bone mass: T-score between -1 and -2.5 SD Osteoporosis:             T-score at or below -2.5 SD RECOMMENDATIONS: 1. All patients should optimize calcium and vitamin D intake. 2. Consider FDA-approved medical therapies in postmenopausal women and men aged 53 years and older, based on the following: a. A hip or vertebral(clinical or morphometric) fracture b. T-score < -2.5 at the femoral neck or spine after appropriate evaluation to exclude secondary causes c. Low bone mass (T-score between -1.0 and -2.5 at the femoral neck or spine) and a 10-year probability of a hip fracture > 3% or a 10-year probability of a major osteoporosis-related fracture > 20% based on the [place]-adapted WHO algorithm 3. Clinician judgment and/or patient preferences may indicate treatment for people with 10-year fracture probabilities above or below these levels FOLLOW-UP: People with diagnosed cases of osteoporosis or at high risk for fracture should have regular bone mineral density tests. For patients eligible for Medicare, routine testing is allowed once every 2 years. The testing frequency can be increased to one year for patients who have rapidly progressing disease, those who are receiving or discontinuing medical therapy to restore bone mass, or have additional  risk factors. I have reviewed this report, and agree with the above findings. [facility] Electronically Signed   By: Frederico Hamman M.D.   On: 08/09/2023 14:57       Assessment & Plan:  Hypercholesterolemia Assessment & Plan: Continue lipitor.  Low cholesterol diet and exercise.  Follow lipid panel and liver function tests.   Orders: -     CBC with Differential/Platelet; Future -     Hepatic function panel; Future -     Lipid panel; Future  Primary hypertension Assessment & Plan: Continue amlodipine and hctz.  Follow pressures.  Follow metabolic panel.    Orders: -     Basic metabolic panel; Future  Stress Assessment & Plan: Increased stress as outlined. Discussed. Does not feel needs any further intervention at this time. Follow.     Osteoporosis without current pathological fracture, unspecified osteoporosis type Assessment & Plan: Continue calcium and vitamin D.  fosamax weekly.  Weight bearing exercise.    Carcinoma of upper-outer quadrant of left breast in female, estrogen receptor positive (HCC) Assessment & Plan: Left breast stage I breast cancer ER/PR positive HER2 negative [s/p lumpectomy [facility]; Dr.Hwang] ;  Subjective:    Patient ID: Jamie Elliott, female    DOB: 10-06-1946, 77 y.o.   MRN: [id number]  Patient here for  Chief Complaint  Patient presents with   Medical Management of Chronic Issues    HPI Here for a scheduled follow up.  Recently diagnosed with breast cancer. Is s/p left breast partial mastectomy 04/22/23, with f/u surgery 05/20/23. XRT. Was seen by surgery 09/01/23 - left breast cellulitis with draining seroma.  Placed on bactrim. Had f/u 09/22/23 - drainage site closed and no evidence of infection. Plan f/u 05/2024. Had f/u with Dr Donneta Romberg 09/24/23 - recommended starting anastrozole. On fosamax.  Reports persistent increased soreness left breast.  Increased redness.  Improved right after abx, but has noticed more redness since stopping.  No fever.  Eating and drinking ok.  No nausea or vomiting.  No abdominal pain or bowel change.  Tolerated the abx without problems.  Increased stress - family stress. Discussed. Does not feel needs any further intervention at this time.    Past Medical History:  Diagnosis Date   Environmental allergies    Hypercholesterolemia    Hypertension    Migraines    Past Surgical History:  Procedure Laterality Date   ABDOMINAL HYSTERECTOMY  2000   APPENDECTOMY  2000   CATARACT EXTRACTION W/PHACO Left 04/23/2021   Procedure: CATARACT EXTRACTION PHACO AND INTRAOCULAR LENS PLACEMENT (IOC) LEFT panoptix toric 10.41 01:22.8 12.6%;  Surgeon: Lockie Mola, MD;  Location: [facility];  Service: Ophthalmology;  Laterality: Left;   CATARACT EXTRACTION W/PHACO Right 05/07/2021   Procedure: CATARACT EXTRACTION PHACO AND INTRAOCULAR LENS PLACEMENT (IOC) RIGHT  panoptix 12.85 01:33.8 13.7%;  Surgeon: Lockie Mola, MD;  Location: [facility];  Service: Ophthalmology;  Laterality: Right;   NOSE SURGERY  1967   SEPTOPLASTY     SHOULDER ARTHROSCOPY WITH OPEN ROTATOR CUFF REPAIR Right 12/05/2015   Procedure: SHOULDER ARTHROSCOPY WITH MINI  OPEN ROTATOR CUFF REPAIR. DISTAL CLAVICLE EXCISION;  Surgeon: Juanell Fairly, MD;  Location: [facility];  Service: Orthopedics;  Laterality: Right;   Family History  Problem Relation Age of Onset   Heart disease Mother    Hyperlipidemia Mother    Hypertension Mother    Lung cancer Father        dx 84s   Hyperlipidemia Father    Hypertension Father    Prostate cancer Father        dx 54s   Breast cancer Maternal Aunt        dx 60s-70s   Breast cancer Maternal Grandmother        dx 90s   Bladder Cancer Neg Hx    Kidney cancer Neg Hx    Social History   Socioeconomic History   Marital status: Married    Spouse name: Not on file   Number of children: 2   Years of education: Not on file   Highest education level: Bachelor's degree (e.g., BA, AB, BS)  Occupational History   Not on file  Tobacco Use   Smoking status: Never   Smokeless tobacco: Never  Vaping Use   Vaping status: Never Used  Substance and Sexual Activity   Alcohol use: No    Alcohol/week: 0.0 standard drinks of alcohol   Drug use: No   Sexual activity: Not Currently  Other Topics Concern   Not on file  Social History Narrative   Not on file   Social Determinants of Health   Financial Resource Strain: Low Risk  (  Subjective:    Patient ID: Jamie Elliott, female    DOB: 10-06-1946, 77 y.o.   MRN: [id number]  Patient here for  Chief Complaint  Patient presents with   Medical Management of Chronic Issues    HPI Here for a scheduled follow up.  Recently diagnosed with breast cancer. Is s/p left breast partial mastectomy 04/22/23, with f/u surgery 05/20/23. XRT. Was seen by surgery 09/01/23 - left breast cellulitis with draining seroma.  Placed on bactrim. Had f/u 09/22/23 - drainage site closed and no evidence of infection. Plan f/u 05/2024. Had f/u with Dr Donneta Romberg 09/24/23 - recommended starting anastrozole. On fosamax.  Reports persistent increased soreness left breast.  Increased redness.  Improved right after abx, but has noticed more redness since stopping.  No fever.  Eating and drinking ok.  No nausea or vomiting.  No abdominal pain or bowel change.  Tolerated the abx without problems.  Increased stress - family stress. Discussed. Does not feel needs any further intervention at this time.    Past Medical History:  Diagnosis Date   Environmental allergies    Hypercholesterolemia    Hypertension    Migraines    Past Surgical History:  Procedure Laterality Date   ABDOMINAL HYSTERECTOMY  2000   APPENDECTOMY  2000   CATARACT EXTRACTION W/PHACO Left 04/23/2021   Procedure: CATARACT EXTRACTION PHACO AND INTRAOCULAR LENS PLACEMENT (IOC) LEFT panoptix toric 10.41 01:22.8 12.6%;  Surgeon: Lockie Mola, MD;  Location: [facility];  Service: Ophthalmology;  Laterality: Left;   CATARACT EXTRACTION W/PHACO Right 05/07/2021   Procedure: CATARACT EXTRACTION PHACO AND INTRAOCULAR LENS PLACEMENT (IOC) RIGHT  panoptix 12.85 01:33.8 13.7%;  Surgeon: Lockie Mola, MD;  Location: [facility];  Service: Ophthalmology;  Laterality: Right;   NOSE SURGERY  1967   SEPTOPLASTY     SHOULDER ARTHROSCOPY WITH OPEN ROTATOR CUFF REPAIR Right 12/05/2015   Procedure: SHOULDER ARTHROSCOPY WITH MINI  OPEN ROTATOR CUFF REPAIR. DISTAL CLAVICLE EXCISION;  Surgeon: Juanell Fairly, MD;  Location: [facility];  Service: Orthopedics;  Laterality: Right;   Family History  Problem Relation Age of Onset   Heart disease Mother    Hyperlipidemia Mother    Hypertension Mother    Lung cancer Father        dx 84s   Hyperlipidemia Father    Hypertension Father    Prostate cancer Father        dx 54s   Breast cancer Maternal Aunt        dx 60s-70s   Breast cancer Maternal Grandmother        dx 90s   Bladder Cancer Neg Hx    Kidney cancer Neg Hx    Social History   Socioeconomic History   Marital status: Married    Spouse name: Not on file   Number of children: 2   Years of education: Not on file   Highest education level: Bachelor's degree (e.g., BA, AB, BS)  Occupational History   Not on file  Tobacco Use   Smoking status: Never   Smokeless tobacco: Never  Vaping Use   Vaping status: Never Used  Substance and Sexual Activity   Alcohol use: No    Alcohol/week: 0.0 standard drinks of alcohol   Drug use: No   Sexual activity: Not Currently  Other Topics Concern   Not on file  Social History Narrative   Not on file   Social Determinants of Health   Financial Resource Strain: Low Risk  (

## 2023-10-15 DIAGNOSIS — Y838 Other surgical procedures as the cause of abnormal reaction of the patient, or of later complication, without mention of misadventure at the time of the procedure: Secondary | ICD-10-CM | POA: Diagnosis not present

## 2023-10-15 DIAGNOSIS — Z79899 Other long term (current) drug therapy: Secondary | ICD-10-CM | POA: Diagnosis not present

## 2023-10-15 DIAGNOSIS — C50412 Malignant neoplasm of upper-outer quadrant of left female breast: Secondary | ICD-10-CM | POA: Diagnosis not present

## 2023-10-15 DIAGNOSIS — Z170 Estrogen receptor positive status [ER+]: Secondary | ICD-10-CM | POA: Diagnosis not present

## 2023-10-15 DIAGNOSIS — T888XXA Other specified complications of surgical and medical care, not elsewhere classified, initial encounter: Secondary | ICD-10-CM | POA: Diagnosis not present

## 2023-10-15 DIAGNOSIS — N61 Mastitis without abscess: Secondary | ICD-10-CM | POA: Diagnosis not present

## 2023-11-08 ENCOUNTER — Other Ambulatory Visit (INDEPENDENT_AMBULATORY_CARE_PROVIDER_SITE_OTHER): Payer: Medicare Other

## 2023-11-08 DIAGNOSIS — E7800 Pure hypercholesterolemia, unspecified: Secondary | ICD-10-CM

## 2023-11-08 DIAGNOSIS — I10 Essential (primary) hypertension: Secondary | ICD-10-CM

## 2023-11-08 LAB — CBC WITH DIFFERENTIAL/PLATELET
Basophils Absolute: 0.1 10*3/uL (ref 0.0–0.1)
Basophils Relative: 0.9 % (ref 0.0–3.0)
Eosinophils Absolute: 0.2 10*3/uL (ref 0.0–0.7)
Eosinophils Relative: 3 % (ref 0.0–5.0)
HCT: 38.4 % (ref 36.0–46.0)
Hemoglobin: 12.8 g/dL (ref 12.0–15.0)
Lymphocytes Relative: 17.8 % (ref 12.0–46.0)
Lymphs Abs: 1 10*3/uL (ref 0.7–4.0)
MCHC: 33.4 g/dL (ref 30.0–36.0)
MCV: 94.5 fL (ref 78.0–100.0)
Monocytes Absolute: 0.4 10*3/uL (ref 0.1–1.0)
Monocytes Relative: 7.5 % (ref 3.0–12.0)
Neutro Abs: 4.2 10*3/uL (ref 1.4–7.7)
Neutrophils Relative %: 70.8 % (ref 43.0–77.0)
Platelets: 226 10*3/uL (ref 150.0–400.0)
RBC: 4.06 Mil/uL (ref 3.87–5.11)
RDW: 13.9 % (ref 11.5–15.5)
WBC: 5.9 10*3/uL (ref 4.0–10.5)

## 2023-11-08 LAB — HEPATIC FUNCTION PANEL
ALT: 18 U/L (ref 0–35)
AST: 18 U/L (ref 0–37)
Albumin: 4 g/dL (ref 3.5–5.2)
Alkaline Phosphatase: 75 U/L (ref 39–117)
Bilirubin, Direct: 0.1 mg/dL (ref 0.0–0.3)
Total Bilirubin: 0.5 mg/dL (ref 0.2–1.2)
Total Protein: 6.3 g/dL (ref 6.0–8.3)

## 2023-11-08 LAB — BASIC METABOLIC PANEL
BUN: 13 mg/dL (ref 6–23)
CO2: 29 meq/L (ref 19–32)
Calcium: 10.1 mg/dL (ref 8.4–10.5)
Chloride: 103 meq/L (ref 96–112)
Creatinine, Ser: 0.73 mg/dL (ref 0.40–1.20)
GFR: 79.13 mL/min (ref >60.00)
Glucose, Bld: 86 mg/dL (ref 70–99)
Potassium: 3.6 meq/L (ref 3.5–5.1)
Sodium: 140 meq/L (ref 135–145)

## 2023-11-08 LAB — LIPID PANEL
Cholesterol: 159 mg/dL (ref 0–200)
HDL: 48 mg/dL (ref >39.00)
LDL Cholesterol: 84 mg/dL (ref 0–99)
NonHDL: 111.13
Total CHOL/HDL Ratio: 3
Triglycerides: 134 mg/dL (ref 0.0–149.0)
VLDL: 26.8 mg/dL (ref 0.0–40.0)

## 2023-11-11 ENCOUNTER — Ambulatory Visit (INDEPENDENT_AMBULATORY_CARE_PROVIDER_SITE_OTHER): Payer: Medicare Other | Admitting: Internal Medicine

## 2023-11-11 ENCOUNTER — Encounter: Payer: Self-pay | Admitting: Internal Medicine

## 2023-11-11 VITALS — BP 118/70 | HR 77 | Temp 98.0°F | Ht 62.0 in | Wt 123.0 lb

## 2023-11-11 DIAGNOSIS — N61 Mastitis without abscess: Secondary | ICD-10-CM | POA: Diagnosis not present

## 2023-11-11 DIAGNOSIS — C50412 Malignant neoplasm of upper-outer quadrant of left female breast: Secondary | ICD-10-CM

## 2023-11-11 DIAGNOSIS — E7800 Pure hypercholesterolemia, unspecified: Secondary | ICD-10-CM | POA: Diagnosis not present

## 2023-11-11 DIAGNOSIS — Z170 Estrogen receptor positive status [ER+]: Secondary | ICD-10-CM

## 2023-11-11 DIAGNOSIS — F439 Reaction to severe stress, unspecified: Secondary | ICD-10-CM

## 2023-11-11 DIAGNOSIS — M810 Age-related osteoporosis without current pathological fracture: Secondary | ICD-10-CM

## 2023-11-11 DIAGNOSIS — I10 Essential (primary) hypertension: Secondary | ICD-10-CM

## 2023-11-11 NOTE — Progress Notes (Signed)
Subjective:    Patient ID: Jamie Elliott, female    DOB: 11-19-1946, 77 y.o.   MRN: [id number]  Patient here for  Chief Complaint  Patient presents with   Medical Management of Chronic Issues    6 week f/up discuss labs, left breast soreness     HPI Here for a scheduled follow up. Here to follow up regarding hypercholesterolemia and hypertension. Recently diagnosed with breast cancer. Is s/p left breast partial mastectomy 04/22/23, with f/u surgery 05/20/23. XRT. Had f/u with Dr Donneta Romberg 09/24/23 - recommended starting anastrozole. On fosamax. Had f/u [facility] - 10/15/23 - recommended f/u and diagnostic mammogram in 05/2024.  Had f/u regarding breast cellulitis.  Treated with bactrim.  Warm compresses.  F/u with radiation oncology planned 11/2023. She is doing relatively well.  Still concerned regarding her breast.  Previous drainage has subsided. Area of concern is smaller.  No fever.  Walking.  No chest pain or sob reported.  No cough or congestion.  No abdominal pain.     Past Medical History:  Diagnosis Date   Environmental allergies    Hypercholesterolemia    Hypertension    Migraines    Past Surgical History:  Procedure Laterality Date   ABDOMINAL HYSTERECTOMY  2000   APPENDECTOMY  2000   CATARACT EXTRACTION W/PHACO Left 04/23/2021   Procedure: CATARACT EXTRACTION PHACO AND INTRAOCULAR LENS PLACEMENT (IOC) LEFT panoptix toric 10.41 01:22.8 12.6%;  Surgeon: Lockie Mola, MD;  Location: [facility];  Service: Ophthalmology;  Laterality: Left;   CATARACT EXTRACTION W/PHACO Right 05/07/2021   Procedure: CATARACT EXTRACTION PHACO AND INTRAOCULAR LENS PLACEMENT (IOC) RIGHT  panoptix 12.85 01:33.8 13.7%;  Surgeon: Lockie Mola, MD;  Location: [facility];  Service: Ophthalmology;  Laterality: Right;   NOSE SURGERY  1967   SEPTOPLASTY     SHOULDER ARTHROSCOPY WITH OPEN ROTATOR CUFF REPAIR Right 12/05/2015   Procedure: SHOULDER ARTHROSCOPY WITH MINI OPEN ROTATOR  CUFF REPAIR. DISTAL CLAVICLE EXCISION;  Surgeon: Juanell Fairly, MD;  Location: [facility];  Service: Orthopedics;  Laterality: Right;   Family History  Problem Relation Age of Onset   Heart disease Mother    Hyperlipidemia Mother    Hypertension Mother    Lung cancer Father        dx 72s   Hyperlipidemia Father    Hypertension Father    Prostate cancer Father        dx 78s   Breast cancer Maternal Aunt        dx 60s-70s   Breast cancer Maternal Grandmother        dx 90s   Bladder Cancer Neg Hx    Kidney cancer Neg Hx    Social History   Socioeconomic History   Marital status: Married    Spouse name: Not on file   Number of children: 2   Years of education: Not on file   Highest education level: Bachelor's degree (e.g., BA, AB, BS)  Occupational History   Not on file  Tobacco Use   Smoking status: Never   Smokeless tobacco: Never  Vaping Use   Vaping status: Never Used  Substance and Sexual Activity   Alcohol use: No    Alcohol/week: 0.0 standard drinks of alcohol   Drug use: No   Sexual activity: Not Currently  Other Topics Concern   Not on file  Social History Narrative   Not on file   Social Determinants of Health   Financial Resource Strain: Low Risk  (10/03/2023)  Overall Financial Resource Strain (CARDIA)    Difficulty of Paying Living Expenses: Not hard at all  Food Insecurity: No Food Insecurity (10/03/2023)   Hunger Vital Sign    Worried About Running Out of Food in the Last Year: Never true    Ran Out of Food in the Last Year: Never true  Transportation Needs: No Transportation Needs (10/03/2023)   PRAPARE - Administrator, Civil Service (Medical): No    Lack of Transportation (Non-Medical): No  Physical Activity: Sufficiently Active (10/03/2023)   Exercise Vital Sign    Days of Exercise per Week: 5 days    Minutes of Exercise per Session: 40 min  Stress: No Stress Concern Present (10/03/2023)   [employer] of Occupational  Health - Occupational Stress Questionnaire    Feeling of Stress : Not at all  Social Connections: Socially Integrated (10/03/2023)   Social Connection and Isolation Panel [NHANES]    Frequency of Communication with Friends and Family: More than three times a week    Frequency of Social Gatherings with Friends and Family: Twice a week    Attends Religious Services: More than 4 times per year    Active Member of [employer] or Organizations: Yes    Attends Engineer, structural: More than 4 times per year    Marital Status: Married     Review of Systems  Constitutional:  Negative for appetite change, fever and unexpected weight change.  HENT:  Negative for congestion and sinus pressure.   Respiratory:  Negative for cough, chest tightness and shortness of breath.   Cardiovascular:  Negative for chest pain and palpitations.       No increased swelling.   Gastrointestinal:  Negative for abdominal pain, diarrhea, nausea and vomiting.  Genitourinary:  Negative for difficulty urinating and dysuria.  Musculoskeletal:  Negative for joint swelling and myalgias.  Skin:  Negative for rash.       Left breast - small erythematous area.  No drainage.  Decreased tenderness.   Neurological:  Negative for dizziness and headaches.  Psychiatric/Behavioral:  Negative for agitation and dysphoric mood.        Objective:     BP 118/70   Pulse 77   Temp 98 F (36.7 C) (Oral)   Ht 5\' 2"  (1.575 m)   Wt 123 lb (55.8 kg)   LMP 11/28/1998   SpO2 98%   BMI 22.50 kg/m  Wt Readings from Last 3 Encounters:  11/11/23 123 lb (55.8 kg)  10/04/23 123 lb 12.8 oz (56.2 kg)  09/24/23 125 lb 6.4 oz (56.9 kg)    Physical Exam Vitals reviewed.  Constitutional:      General: She is not in acute distress.    Appearance: Normal appearance.  HENT:     Head: Normocephalic and atraumatic.     Right Ear: External ear normal.     Left Ear: External ear normal.  Eyes:     General: No scleral icterus.        Right eye: No discharge.        Left eye: No discharge.     Conjunctiva/sclera: Conjunctivae normal.  Neck:     Thyroid: No thyromegaly.  Cardiovascular:     Rate and Rhythm: Normal rate and regular rhythm.  Pulmonary:     Effort: No respiratory distress.     Breath sounds: Normal breath sounds. No wheezing.  Abdominal:     General: Bowel sounds are normal.     Palpations: Abdomen  is soft.     Tenderness: There is no abdominal tenderness.  Musculoskeletal:        General: No swelling or tenderness.     Cervical back: Neck supple. No tenderness.  Lymphadenopathy:     Cervical: No cervical adenopathy.  Skin:    Comments: Left breast - small erythematous area - lateral breast.  No drainage.  Minimal tenderness (decreased).  Overall improved.   Neurological:     Mental Status: She is alert.  Psychiatric:        Mood and Affect: Mood normal.        Behavior: Behavior normal.      Outpatient Encounter Medications as of 11/11/2023  Medication Sig   acetaminophen (TYLENOL) 500 MG tablet Take 500 mg by mouth every 6 (six) hours as needed.   alendronate (FOSAMAX) 70 MG tablet Take 1 tablet (70 mg total) by mouth every 7 (seven) days. Take with a full glass of water on an empty stomach.   amLODipine (NORVASC) 5 MG tablet Take 1 tablet (5 mg total) by mouth daily.   anastrozole (ARIMIDEX) 1 MG tablet Take 1 tablet (1 mg total) by mouth daily.   Ascorbic Acid (VITAMIN C) 1000 MG tablet Take 1,000 mg by mouth daily.   atorvastatin (LIPITOR) 40 MG tablet Take 1 tablet (40 mg total) by mouth daily.   cetirizine (ZYRTEC) 10 MG tablet Take 1 tablet (10 mg total) by mouth daily.   Cholecalciferol (VITAMIN D3) 25 MCG (1000 UT) CAPS Take 1 capsule by mouth daily.   CRANBERRY EXTRACT PO Take 1 capsule by mouth 2 (two) times daily.   D-Mannose 500 MG CAPS Take 500 mg by mouth in the morning and at bedtime.   hydrochlorothiazide (HYDRODIURIL) 12.5 MG tablet TAKE ONE TABLET DAILY.   losartan  (COZAAR) 100 MG tablet Take 1 tablet (100 mg total) by mouth daily.   polyethylene glycol (MIRALAX / GLYCOLAX) 17 g packet Take 17 g by mouth every other day.   Probiotic Product (ALIGN PO) Take by mouth as needed.   sulfamethoxazole-trimethoprim (BACTRIM DS) 800-160 MG tablet Take 1 tablet by mouth 2 (two) times daily.   triamcinolone (NASACORT) 55 MCG/ACT nasal inhaler Place 2 sprays into the nose as needed.   No facility-administered encounter medications on file as of 11/11/2023.     Lab Results  Component Value Date   WBC 5.9 11/08/2023   HGB 12.8 11/08/2023   HCT 38.4 11/08/2023   PLT 226.0 11/08/2023   GLUCOSE 86 11/08/2023   CHOL 159 11/08/2023   TRIG 134.0 11/08/2023   HDL 48.00 11/08/2023   LDLDIRECT 103.0 05/21/2015   LDLCALC 84 11/08/2023   ALT 18 11/08/2023   AST 18 11/08/2023   NA 140 11/08/2023   K 3.6 11/08/2023   CL 103 11/08/2023   CREATININE 0.73 11/08/2023   BUN 13 11/08/2023   CO2 29 11/08/2023   TSH 1.75 01/14/2023   INR 1.0 03/17/2022    DG Bone Density  Result Date: 08/09/2023 EXAM: DUAL X-RAY ABSORPTIOMETRY (DXA) FOR BONE MINERAL DENSITY IMPRESSION: Your patient Arbella Krolik completed a BMD test on 08/09/2023 using the [employer] DXA System (software version: 14.10) manufactured by [employer]. The following summarizes the results of our evaluation. Technologist:VLM PATIENT BIOGRAPHICAL: Name: Leala, Olive Patient ID: [id number] Birth Date: 1946-11-21 Height: 62.0 in. Gender: Female Exam Date: 08/09/2023 Weight: 125.6 lbs. Indications: Caucasian, History of Breast Cancer, Hysterectomy, Postmenopausal Fractures: Treatments: calcium w/ vit D DENSITOMETRY RESULTS: Site  Region     Measured Date Measured Age WHO Classification Young Adult T-score BMD         %Change vs. Previous Significant Change (*) Right Forearm Radius 33% 08/09/20[age].5 Oste[age] -1.6 0.737 g/cm2 - - DualFemur Total Left 8/09/2023 [age] Osteoporosis -2.6 0.686 g/cm2  - - ASSESSMENT: The BMD measured at Femur Total Left is 0.686 g/cm2 with a T-score of -2.6. This patient is considered osteoporotic according to World Health Organization [facility]) criteria. Lumbar spine was not utilized due to advanced degenerative changes and scoliosis. The scan quality is good. World Science writer [facility]) criteria for post-menopausal, Caucasian Women: Normal:                   T-score at or above -1 SD Osteopenia/low bone mass: T-score between -1 and -2.5 SD Osteoporosis:             T-score at or below -2.5 SD RECOMMENDATIONS: 1. All patients should optimize calcium and vitamin D intake. 2. Consider FDA-approved medical therapies in postmenopausal women and men aged 62 years and older, based on the following: a. A hip or vertebral(clinical or morphometric) fracture b. T-score < -2.5 at the femoral neck or spine after appropriate evaluation to exclude secondary causes c. Low bone mass (T-score between -1.0 and -2.5 at the femoral neck or spine) and a 10-year probability of a hip fracture > 3% or a 10-year probability of a major osteoporosis-related fracture > 20% based on the [place]-adapted WHO algorithm 3. Clinician judgment and/or patient preferences may indicate treatment for people with 10-year fracture probabilities above or below these levels FOLLOW-UP: People with diagnosed cases of osteoporosis or at high risk for fracture should have regular bone mineral density tests. For patients eligible for Medicare, routine testing is allowed once every 2 years. The testing frequency can be increased to one year for patients who have rapidly progressing disease, those who are receiving or discontinuing medical therapy to restore bone mass, or have additional risk factors. I have reviewed this report, and agree with the above findings. [facility] Electronically Signed   By: Frederico Hamman M.D.   On: 08/09/2023 14:57       Assessment & Plan:  Carcinoma of upper-outer quadrant of  left breast in female, estrogen receptor positive (HCC) Assessment & Plan: Left breast stage I breast cancer ER/PR positive HER2 negative [s/p lumpectomy [facility]; Dr.Hwang] ; pT1b pSnLNx-M0- G-2. S/p left breast partial mastectomy 04/22/23. Had to go back for f/u surgery 05/20/23.  XRT.  Incision - closed.  Minimal erythema.  Discussed scarring. Recently treated for cellulitis.  Improved.  Reevaluation today.  No drainage. Area  smaller. Hold on further abx. Follow.  Started anastrozole.    Cellulitis of breast Assessment & Plan: Overall improved as outlined.  No further abx at this time.  Follow.    Primary hypertension Assessment & Plan: Continue amlodipine and hctz.  Follow pressures.  Follow metabolic panel.     Hypercholesterolemia Assessment & Plan: Continue lipitor.  Low cholesterol diet and exercise.  Follow lipid panel and liver function tests.    Osteoporosis without current pathological fracture, unspecified osteoporosis type Assessment & Plan: Continue calcium and vitamin D.  fosamax weekly.  Weight bearing exercise.    Stress Assessment & Plan: Increased stress related to her medical issues. Overall appears to be handling things well. Does not feel needs any further intervention at this time. Follow.        Dale Grantley, MD

## 2023-11-14 ENCOUNTER — Encounter: Payer: Self-pay | Admitting: Internal Medicine

## 2023-11-14 NOTE — Assessment & Plan Note (Signed)
Left breast stage I breast cancer ER/PR positive HER2 negative [s/p lumpectomy [facility]; Dr.Hwang] ; pT1b pSnLNx-M0- G-2. S/p left breast partial mastectomy 04/22/23. Had to go back for f/u surgery 05/20/23.  XRT.  Incision - closed.  Minimal erythema.  Discussed scarring. Recently treated for cellulitis.  Improved.  Reevaluation today.  No drainage. Area  smaller. Hold on further abx. Follow.  Started anastrozole.

## 2023-11-14 NOTE — Assessment & Plan Note (Signed)
Overall improved as outlined.  No further abx at this time.  Follow.

## 2023-11-14 NOTE — Assessment & Plan Note (Signed)
Increased stress related to her medical issues. Overall appears to be handling things well. Does not feel needs any further intervention at this time. Follow.

## 2023-11-14 NOTE — Assessment & Plan Note (Signed)
Continue amlodipine and hctz.  Follow pressures.  Follow metabolic panel.

## 2023-11-14 NOTE — Assessment & Plan Note (Signed)
Continue calcium and vitamin D.  fosamax weekly.  Weight bearing exercise.

## 2023-11-14 NOTE — Assessment & Plan Note (Signed)
Continue lipitor.  Low cholesterol diet and exercise.  Follow lipid panel and liver function tests.

## 2023-11-16 ENCOUNTER — Telehealth: Payer: Self-pay | Admitting: Internal Medicine

## 2023-11-16 NOTE — Telephone Encounter (Signed)
Prescription Request  11/16/2023  LOV: 11/11/2023  What is the name of the medication or equipment? hydrochlorothiazide   Have you contacted your pharmacy to request a refill? No   Which pharmacy would you like this sent to? [facility]   Patient notified that their request is being sent to the clinical staff for review and that they should receive a response within 2 business days.   Please advise at Mobile [phone] (mobile)

## 2023-11-17 ENCOUNTER — Other Ambulatory Visit: Payer: Self-pay

## 2023-11-17 MED ORDER — HYDROCHLOROTHIAZIDE 12.5 MG PO TABS: 12.5000 mg | ORAL_TABLET | Freq: Every day | ORAL | 1 refills | Status: DC

## 2023-11-17 NOTE — Telephone Encounter (Signed)
Medication refilled

## 2023-11-26 ENCOUNTER — Encounter: Payer: Self-pay | Admitting: Nurse Practitioner

## 2023-11-26 ENCOUNTER — Ambulatory Visit (INDEPENDENT_AMBULATORY_CARE_PROVIDER_SITE_OTHER): Payer: Medicare Other | Admitting: Nurse Practitioner

## 2023-11-26 VITALS — BP 110/64 | HR 95 | Temp 97.9°F | Ht 62.0 in | Wt 124.2 lb

## 2023-11-26 DIAGNOSIS — R21 Rash and other nonspecific skin eruption: Secondary | ICD-10-CM | POA: Diagnosis not present

## 2023-11-26 NOTE — Patient Instructions (Addendum)
Use cold compress 2-3 times a day, hydrocortisone cream twice a day  and benadryl at night. If not improving call the office.

## 2023-11-26 NOTE — Progress Notes (Signed)
Established Patient Office Visit  Subjective:  Patient ID: Jamie Elliott, female    DOB: [birth year]  Age: 77 y.o. MRN: [id number]  CC:  Chief Complaint  Patient presents with   Rash    HPI  Jamie Elliott presents for rash on the right upper arm after getting the COVID vaccines on 11/23/23.  Patient presents with a red itchy and swollen patch appearing area 3 to 5 days post vaccination.  She denies any difficulty with range of motion.  States the rash has been improved compared to yesterday.  But she still wanted to be get checked out. Denies fever, swelling of the face or lips or difficulty breathing.  HPI   Past Medical History:  Diagnosis Date   Environmental allergies    Hypercholesterolemia    Hypertension    Migraines     Past Surgical History:  Procedure Laterality Date   ABDOMINAL HYSTERECTOMY  2000   APPENDECTOMY  2000   CATARACT EXTRACTION W/PHACO Left 04/23/2021   Procedure: CATARACT EXTRACTION PHACO AND INTRAOCULAR LENS PLACEMENT (IOC) LEFT panoptix toric 10.41 01:22.8 12.6%;  Surgeon: Lockie Mola, MD;  Location: [facility];  Service: Ophthalmology;  Laterality: Left;   CATARACT EXTRACTION W/PHACO Right 05/07/2021   Procedure: CATARACT EXTRACTION PHACO AND INTRAOCULAR LENS PLACEMENT (IOC) RIGHT  panoptix 12.85 01:33.8 13.7%;  Surgeon: Lockie Mola, MD;  Location: [facility];  Service: Ophthalmology;  Laterality: Right;   NOSE SURGERY  1967   SEPTOPLASTY     SHOULDER ARTHROSCOPY WITH OPEN ROTATOR CUFF REPAIR Right 12/05/2015   Procedure: SHOULDER ARTHROSCOPY WITH MINI OPEN ROTATOR CUFF REPAIR. DISTAL CLAVICLE EXCISION;  Surgeon: Juanell Fairly, MD;  Location: [facility];  Service: Orthopedics;  Laterality: Right;    Family History  Problem Relation Age of Onset   Heart disease Mother    Hyperlipidemia Mother    Hypertension Mother    Lung cancer Father        dx 35s   Hyperlipidemia Father    Hypertension Father     Prostate cancer Father        dx 20s   Breast cancer Maternal Aunt        dx 60s-70s   Breast cancer Maternal Grandmother        dx 90s   Bladder Cancer Neg Hx    Kidney cancer Neg Hx     Social History   Socioeconomic History   Marital status: Married    Spouse name: Not on file   Number of children: 2   Years of education: Not on file   Highest education level: Bachelor's degree (e.g., BA, AB, BS)  Occupational History   Not on file  Tobacco Use   Smoking status: Never   Smokeless tobacco: Never  Vaping Use   Vaping status: Never Used  Substance and Sexual Activity   Alcohol use: No    Alcohol/week: 0.0 standard drinks of alcohol   Drug use: No   Sexual activity: Not Currently  Other Topics Concern   Not on file  Social History Narrative   Not on file   Social Drivers of Health   Financial Resource Strain: Low Risk  (10/03/2023)   Overall Financial Resource Strain (CARDIA)    Difficulty of Paying Living Expenses: Not hard at all  Food Insecurity: No Food Insecurity (10/03/2023)   Hunger Vital Sign    Worried About Running Out of Food in the Last Year: Never true    Ran Out of Food  in the Last Year: Never true  Transportation Needs: No Transportation Needs (10/03/2023)   PRAPARE - Administrator, Civil Service (Medical): No    Lack of Transportation (Non-Medical): No  Physical Activity: Sufficiently Active (10/03/2023)   Exercise Vital Sign    Days of Exercise per Week: 5 days    Minutes of Exercise per Session: 40 min  Stress: No Stress Concern Present (10/03/2023)   [employer] of Occupational Health - Occupational Stress Questionnaire    Feeling of Stress : Not at all  Social Connections: Socially Integrated (10/03/2023)   Social Connection and Isolation Panel [NHANES]    Frequency of Communication with Friends and Family: More than three times a week    Frequency of Social Gatherings with Friends and Family: Twice a week    Attends  Religious Services: More than 4 times per year    Active Member of [employer] or Organizations: Yes    Attends Engineer, structural: More than 4 times per year    Marital Status: Married  Catering manager Violence: Not At Risk (01/04/2023)   Humiliation, Afraid, Rape, and Kick questionnaire    Fear of Current or Ex-Partner: No    Emotionally Abused: No    Physically Abused: No    Sexually Abused: No     Outpatient Medications Prior to Visit  Medication Sig Dispense Refill   acetaminophen (TYLENOL) 500 MG tablet Take 500 mg by mouth every 6 (six) hours as needed.     alendronate (FOSAMAX) 70 MG tablet Take 1 tablet (70 mg total) by mouth every 7 (seven) days. Take with a full glass of water on an empty stomach. 4 tablet 11   amLODipine (NORVASC) 5 MG tablet Take 1 tablet (5 mg total) by mouth daily. 90 tablet 2   Ascorbic Acid (VITAMIN C) 1000 MG tablet Take 1,000 mg by mouth daily.     atorvastatin (LIPITOR) 40 MG tablet Take 1 tablet (40 mg total) by mouth daily. 90 tablet 3   cetirizine (ZYRTEC) 10 MG tablet Take 1 tablet (10 mg total) by mouth daily. 90 tablet 3   Cholecalciferol (VITAMIN D3) 25 MCG (1000 UT) CAPS Take 1 capsule by mouth daily.     CRANBERRY EXTRACT PO Take 1 capsule by mouth 2 (two) times daily.     D-Mannose 500 MG CAPS Take 500 mg by mouth in the morning and at bedtime.     hydrochlorothiazide (HYDRODIURIL) 12.5 MG tablet Take 1 tablet (12.5 mg total) by mouth daily. 90 tablet 1   losartan (COZAAR) 100 MG tablet Take 1 tablet (100 mg total) by mouth daily. 90 tablet 1   polyethylene glycol (MIRALAX / GLYCOLAX) 17 g packet Take 17 g by mouth every other day.     Probiotic Product (ALIGN PO) Take by mouth as needed.     triamcinolone (NASACORT) 55 MCG/ACT nasal inhaler Place 2 sprays into the nose as needed. 1 Inhaler 5   anastrozole (ARIMIDEX) 1 MG tablet Take 1 tablet (1 mg total) by mouth daily. 30 tablet 4   sulfamethoxazole-trimethoprim (BACTRIM DS) 800-160  MG tablet Take 1 tablet by mouth 2 (two) times daily. 14 tablet 0   No facility-administered medications prior to visit.    Allergies  Allergen Reactions   Ciprofloxacin Other (See Comments)    Patient reports "it doesn't work for me". Last culture was sensitive, however.    Zithromax [Azithromycin]    Augmentin [Amoxicillin-Pot Clavulanate] Rash   Doxycycline Nausea And  Vomiting    Sensitive on her stomach    ROS Review of Systems Negative unless indicated in HPI.    Objective:    Physical Exam Constitutional:      Appearance: Normal appearance.  Cardiovascular:     Rate and Rhythm: Normal rate and regular rhythm.     Pulses: Normal pulses.     Heart sounds: Normal heart sounds.  Pulmonary:     Effort: Pulmonary effort is normal.     Breath sounds: No stridor. No wheezing.  Musculoskeletal:     Cervical back: Normal range of motion.  Skin:    Findings: Rash present.     Comments: Slightly red, itchy and swollen patch to the right upper shoulder  Neurological:     General: No focal deficit present.     Mental Status: She is alert. Mental status is at baseline.  Psychiatric:        Mood and Affect: Mood normal.        Behavior: Behavior normal.        Thought Content: Thought content normal.        Judgment: Judgment normal.     BP 110/64   Pulse 95   Temp 97.9 F (36.6 C) (Oral)   Ht 5\' 2"  (1.575 m)   Wt 124 lb 3.2 oz (56.3 kg)   LMP 11/28/1998   SpO2 99%   BMI 22.72 kg/m  Wt Readings from Last 3 Encounters:  11/29/23 123 lb 12.8 oz (56.2 kg)  11/26/23 124 lb 3.2 oz (56.3 kg)  11/11/23 123 lb (55.8 kg)     Health Maintenance  Topic Date Due   Zoster Vaccines- Shingrix (1 of 2) 01/27/1965   DTaP/Tdap/Td (2 - Td or Tdap) 01/11/2023   Medicare Annual Wellness (AWV)  01/05/2024   COVID-19 Vaccine (7 - 2024-25 season) 01/18/2024   MAMMOGRAM  02/25/2024   Pneumonia Vaccine 51+ Years old  Completed   INFLUENZA VACCINE  Completed   DEXA SCAN   Completed   Hepatitis C Screening  Completed   HPV VACCINES  Aged Out   Colonoscopy  Discontinued    There are no preventive care reminders to display for this patient.  Lab Results  Component Value Date   TSH 1.75 01/14/2023   Lab Results  Component Value Date   WBC 5.9 11/08/2023   HGB 12.8 11/08/2023   HCT 38.4 11/08/2023   MCV 94.5 11/08/2023   PLT 226.0 11/08/2023   Lab Results  Component Value Date   NA 140 11/08/2023   K 3.6 11/08/2023   CO2 29 11/08/2023   GLUCOSE 86 11/08/2023   BUN 13 11/08/2023   CREATININE 0.73 11/08/2023   BILITOT 0.5 11/08/2023   ALKPHOS 75 11/08/2023   AST 18 11/08/2023   ALT 18 11/08/2023   PROT 6.3 11/08/2023   ALBUMIN 4.0 11/08/2023   CALCIUM 10.1 11/08/2023   ANIONGAP 12 03/17/2022   GFR 79.13 11/08/2023   Lab Results  Component Value Date   CHOL 159 11/08/2023   Lab Results  Component Value Date   HDL 48.00 11/08/2023   Lab Results  Component Value Date   LDLCALC 84 11/08/2023   Lab Results  Component Value Date   TRIG 134.0 11/08/2023   Lab Results  Component Value Date   CHOLHDL 3 11/08/2023   No results found for: "HGBA1C"     Assessment & Plan:  Rash Assessment & Plan: Advised patient to use cool compress 2-3 to the affected area.  Can use hydrocortisone cream twice daily to reduce redness and itching and benadryl during the night for itching Patient would let us know if symptoms not improving.     Follow-up: No follow-ups on file.   Kara Dies, NP

## 2023-11-29 ENCOUNTER — Encounter: Payer: Self-pay | Admitting: Internal Medicine

## 2023-11-29 ENCOUNTER — Inpatient Hospital Stay: Payer: Medicare Other | Attending: Internal Medicine | Admitting: Internal Medicine

## 2023-11-29 VITALS — BP 122/70 | HR 85 | Temp 97.9°F | Resp 19 | Wt 123.8 lb

## 2023-11-29 DIAGNOSIS — Z79811 Long term (current) use of aromatase inhibitors: Secondary | ICD-10-CM | POA: Diagnosis not present

## 2023-11-29 DIAGNOSIS — Z923 Personal history of irradiation: Secondary | ICD-10-CM | POA: Diagnosis not present

## 2023-11-29 DIAGNOSIS — Z170 Estrogen receptor positive status [ER+]: Secondary | ICD-10-CM | POA: Insufficient documentation

## 2023-11-29 DIAGNOSIS — Z79899 Other long term (current) drug therapy: Secondary | ICD-10-CM | POA: Insufficient documentation

## 2023-11-29 DIAGNOSIS — C50412 Malignant neoplasm of upper-outer quadrant of left female breast: Secondary | ICD-10-CM | POA: Insufficient documentation

## 2023-11-29 MED ORDER — ANASTROZOLE 1 MG PO TABS: 1.0000 mg | ORAL_TABLET | Freq: Every day | ORAL | 1 refills | Status: DC

## 2023-11-29 NOTE — Assessment & Plan Note (Addendum)
#   Left breast stage I breast cancer ER/PR positive HER2 negative [s/p lumpectomy [facility]; Dr.Hwang] ; pT1b pSnLNx-M0- G-2. S/p re-excision [[facility]]- s/p radiation at [facility] on 7/29/ 2024.  JUNE 2024- RS-23; recurrence < 9.   # currently on adjuvant  Anastrozole [x 5 years; till AUG 2029]- tolerating well. 09 days refills sent.   # OSTEOPOROSIS-AUG 2024- BMD [Dr.Scott; ]  T-score of -2.6.  Marland KitchenCurrently on Fosomax- to inform us if reflux or if BMD does not improve in 2026.and then might consider reclast  # Genetic counseling: no pathogenic mutations.   # Vaccination; recommend Flu shots/COVID-   # DISPOSITION: # follow up in 6  months; MD; No labs-Dr.B

## 2023-11-29 NOTE — Progress Notes (Signed)
[facility] CONSULT NOTE  Patient Care Team: Dale B[place], MD as PCP - General (Internal Medicine) Hulen Luster, RN as Oncology Nurse Navigator Earna Coder, MD as Consulting Physician (Oncology)  CHIEF COMPLAINTS/PURPOSE OF CONSULTATION: Breast cancer  #  Oncology History Overview Note   [facility]- MARCH 2024-  There is a subtle 0.8 cm x 0.6 cm x 0.5 cm  irregularly shaped,  Invasive adenocarcinoma of the breast (7 mm on core). Histologic type: Ductal with lobular features Provisional Nottingham combined histologic grade: 2 of 3 Tubule formation score: 3 Nuclear pleomorphism score: 2 Mitotic rate score:  1   Ductal carcinoma in situ (DCIS): Present Type of in-situ carcinoma: Solid and cribriform Nuclear grade of in-situ carcinoma: 2  MARGINS   Margin Status for Invasive Carcinoma:    All margins negative for invasive carcinoma     Distance from Invasive Carcinoma to Closest Margin:    Less than: 1 mm     Closest Margin(s) to Invasive Carcinoma:    Medial   Margin Status for DCIS:    All margins negative for DCIS     Distance from DCIS to Closest Margin:    Greater than: 5 mm     Closest Margin(s) to DCIS:    all margins   REGIONAL LYMPH NODES   Regional Lymph Node Status:    Not applicable (no regional lymph nodes submitted or found)     pT Category:    pT1b   pN Category:    pN not assigned (no nodes submitted or found)   SPECIAL STUDIES     Estrogen Receptor (ER) Status:    Positive (greater than 10% of cells demonstrate nuclear positivity)       Percentage of Cells with Nuclear Positivity:    99 %     Progesterone Receptor (PgR) Status:    Positive       Percentage of Cells with Nuclear Positivity:    64 %     HER2 (by immunohistochemistry):    Negative (Score 1+)   # OCT 2024- plan to start adjuvant  Anastrozole [x 5 years; till  OCT 2029]   Carcinoma of upper-outer quadrant of left breast in female, estrogen receptor positive (HCC)  03/22/2023  Initial Diagnosis   Carcinoma of upper-outer quadrant of left breast in female, estrogen receptor positive   03/22/2023 Cancer Staging   Staging form: Breast, AJCC 8th Edition - Clinical: Stage IA (cT1b, cN0, cM0, G2, ER+, PR+, HER2-) - Signed by Earna Coder, MD on 03/22/2023 Histologic grading system: 3 grade system    Genetic Testing   Negative genetic testing. No pathogenic variants identified on the [facility] Multi-Cancer+RNA panel. The report date is 05/23/2023.  The Multi-Cancer + RNA Panel offered by [facility] includes sequencing and/or deletion/duplication analysis of the following 70 genes:  AIP*, ALK, APC*, ATM*, AXIN2*, BAP1*, BARD1*, BLM*, BMPR1A*, BRCA1*, BRCA2*, BRIP1*, CDC73*, CDH1*, CDK4, CDKN1B*, CDKN2A, CHEK2*, CTNNA1*, DICER1*, EPCAM, EGFR, FH*, FLCN*, GREM1, HOXB13, KIT, LZTR1, MAX*, MBD4, MEN1*, MET, MITF, MLH1*, MSH2*, MSH3*, MSH6*, MUTYH*, NF1*, NF2*, NTHL1*, PALB2*, PDGFRA, PMS2*, POLD1*, POLE*, POT1*, PRKAR1A*, PTCH1*, PTEN*, RAD51C*, RAD51D*, RB1*, RET, SDHA*, SDHAF2*, SDHB*, SDHC*, SDHD*, SMAD4*, SMARCA4*, SMARCB1*, SMARCE1*, STK11*, SUFU*, TMEM127*, TP53*, TSC1*, TSC2*, VHL*. RNA analysis is performed for * genes.'    HISTORY OF PRESENTING ILLNESS: Patient ambulating-independently.Accompanied by family-husband.   Jamie Elliott 77 y.o.  female patient with left  breast cancer stage I ER/PR positive HER2 negative status postlumpectomy [[facility]] s/p  Radiation on adjuvant anastrazole is here for follow-up.  Patient had Covid ( Moderna) shot last week and she had a reaction; pain, fever and red, swollen arm. All her other shots were by [employer] .  Otherwise has been feeling well. Appetite and energy were good. No Breast pain or discomfort. Goes to [facility] on Monday for follow up visit post radiation. Otherwise denies any lumps or bumps.  Review of Systems  Constitutional:  Negative for chills, diaphoresis, fever, malaise/fatigue and weight loss.  HENT:  Negative for nosebleeds  and sore throat.   Eyes:  Negative for double vision.  Respiratory:  Negative for cough, hemoptysis, sputum production, shortness of breath and wheezing.   Cardiovascular:  Negative for chest pain, palpitations, orthopnea and leg swelling.  Gastrointestinal:  Negative for abdominal pain, blood in stool, constipation, diarrhea, heartburn, melena, nausea and vomiting.  Genitourinary:  Negative for dysuria, frequency and urgency.  Musculoskeletal:  Negative for back pain and joint pain.  Skin: Negative.  Negative for itching and rash.  Neurological:  Negative for dizziness, tingling, focal weakness, weakness and headaches.  Endo/Heme/Allergies:  Does not bruise/bleed easily.  Psychiatric/Behavioral:  Negative for depression. The patient is not nervous/anxious and does not have insomnia.      MEDICAL HISTORY:  Past Medical History:  Diagnosis Date   Environmental allergies    Hypercholesterolemia    Hypertension    Migraines     SURGICAL HISTORY: Past Surgical History:  Procedure Laterality Date   ABDOMINAL HYSTERECTOMY  2000   APPENDECTOMY  2000   CATARACT EXTRACTION W/PHACO Left 04/23/2021   Procedure: CATARACT EXTRACTION PHACO AND INTRAOCULAR LENS PLACEMENT (IOC) LEFT panoptix toric 10.41 01:22.8 12.6%;  Surgeon: Lockie Mola, MD;  Location: [facility];  Service: Ophthalmology;  Laterality: Left;   CATARACT EXTRACTION W/PHACO Right 05/07/2021   Procedure: CATARACT EXTRACTION PHACO AND INTRAOCULAR LENS PLACEMENT (IOC) RIGHT  panoptix 12.85 01:33.8 13.7%;  Surgeon: Lockie Mola, MD;  Location: [facility];  Service: Ophthalmology;  Laterality: Right;   NOSE SURGERY  1967   SEPTOPLASTY     SHOULDER ARTHROSCOPY WITH OPEN ROTATOR CUFF REPAIR Right 12/05/2015   Procedure: SHOULDER ARTHROSCOPY WITH MINI OPEN ROTATOR CUFF REPAIR. DISTAL CLAVICLE EXCISION;  Surgeon: Juanell Fairly, MD;  Location: [facility];  Service: Orthopedics;  Laterality: Right;    SOCIAL  HISTORY: Social History   Socioeconomic History   Marital status: Married    Spouse name: Not on file   Number of children: 2   Years of education: Not on file   Highest education level: Bachelor's degree (e.g., BA, AB, BS)  Occupational History   Not on file  Tobacco Use   Smoking status: Never   Smokeless tobacco: Never  Vaping Use   Vaping status: Never Used  Substance and Sexual Activity   Alcohol use: No    Alcohol/week: 0.0 standard drinks of alcohol   Drug use: No   Sexual activity: Not Currently  Other Topics Concern   Not on file  Social History Narrative   Not on file   Social Determinants of Health   Financial Resource Strain: Low Risk  (10/03/2023)   Overall Financial Resource Strain (CARDIA)    Difficulty of Paying Living Expenses: Not hard at all  Food Insecurity: No Food Insecurity (10/03/2023)   Hunger Vital Sign    Worried About Running Out of Food in the Last Year: Never true    Ran Out of Food in the Last Year: Never true  Transportation Needs:  No Transportation Needs (10/03/2023)   PRAPARE - Administrator, Civil Service (Medical): No    Lack of Transportation (Non-Medical): No  Physical Activity: Sufficiently Active (10/03/2023)   Exercise Vital Sign    Days of Exercise per Week: 5 days    Minutes of Exercise per Session: 40 min  Stress: No Stress Concern Present (10/03/2023)   [employer] of Occupational Health - Occupational Stress Questionnaire    Feeling of Stress : Not at all  Social Connections: Socially Integrated (10/03/2023)   Social Connection and Isolation Panel [NHANES]    Frequency of Communication with Friends and Family: More than three times a week    Frequency of Social Gatherings with Friends and Family: Twice a week    Attends Religious Services: More than 4 times per year    Active Member of [employer] or Organizations: Yes    Attends Engineer, structural: More than 4 times per year    Marital Status:  Married  Catering manager Violence: Not At Risk (01/04/2023)   Humiliation, Afraid, Rape, and Kick questionnaire    Fear of Current or Ex-Partner: No    Emotionally Abused: No    Physically Abused: No    Sexually Abused: No    FAMILY HISTORY: Family History  Problem Relation Age of Onset   Heart disease Mother    Hyperlipidemia Mother    Hypertension Mother    Lung cancer Father        dx 46s   Hyperlipidemia Father    Hypertension Father    Prostate cancer Father        dx 34s   Breast cancer Maternal Aunt        dx 60s-70s   Breast cancer Maternal Grandmother        dx 90s   Bladder Cancer Neg Hx    Kidney cancer Neg Hx     ALLERGIES:  is allergic to ciprofloxacin, zithromax [azithromycin], augmentin [amoxicillin-pot clavulanate], and doxycycline.  MEDICATIONS:  Current Outpatient Medications  Medication Sig Dispense Refill   acetaminophen (TYLENOL) 500 MG tablet Take 500 mg by mouth every 6 (six) hours as needed.     alendronate (FOSAMAX) 70 MG tablet Take 1 tablet (70 mg total) by mouth every 7 (seven) days. Take with a full glass of water on an empty stomach. 4 tablet 11   amLODipine (NORVASC) 5 MG tablet Take 1 tablet (5 mg total) by mouth daily. 90 tablet 2   Ascorbic Acid (VITAMIN C) 1000 MG tablet Take 1,000 mg by mouth daily.     atorvastatin (LIPITOR) 40 MG tablet Take 1 tablet (40 mg total) by mouth daily. 90 tablet 3   cetirizine (ZYRTEC) 10 MG tablet Take 1 tablet (10 mg total) by mouth daily. 90 tablet 3   Cholecalciferol (VITAMIN D3) 25 MCG (1000 UT) CAPS Take 1 capsule by mouth daily.     CRANBERRY EXTRACT PO Take 1 capsule by mouth 2 (two) times daily.     D-Mannose 500 MG CAPS Take 500 mg by mouth in the morning and at bedtime.     hydrochlorothiazide (HYDRODIURIL) 12.5 MG tablet Take 1 tablet (12.5 mg total) by mouth daily. 90 tablet 1   losartan (COZAAR) 100 MG tablet Take 1 tablet (100 mg total) by mouth daily. 90 tablet 1   polyethylene glycol  (MIRALAX / GLYCOLAX) 17 g packet Take 17 g by mouth every other day.     Probiotic Product (ALIGN PO) Take by mouth  as needed.     triamcinolone (NASACORT) 55 MCG/ACT nasal inhaler Place 2 sprays into the nose as needed. 1 Inhaler 5   anastrozole (ARIMIDEX) 1 MG tablet Take 1 tablet (1 mg total) by mouth daily. 90 tablet 1   No current facility-administered medications for this visit.     PHYSICAL EXAMINATION:   Vitals:   11/29/23 1041  BP: 122/70  Pulse: 85  Resp: 19  Temp: 97.9 F (36.6 C)  SpO2: 99%       Filed Weights   11/29/23 1041  Weight: 123 lb 12.8 oz (56.2 kg)        Physical Exam Vitals and nursing note reviewed.  HENT:     Head: Normocephalic and atraumatic.     Mouth/Throat:     Pharynx: Oropharynx is clear.  Eyes:     Extraocular Movements: Extraocular movements intact.     Pupils: Pupils are equal, round, and reactive to light.  Cardiovascular:     Rate and Rhythm: Normal rate and regular rhythm.  Pulmonary:     Comments: Decreased breath sounds bilaterally.  Abdominal:     Palpations: Abdomen is soft.  Musculoskeletal:        General: Normal range of motion.     Cervical back: Normal range of motion.  Skin:    General: Skin is warm.  Neurological:     General: No focal deficit present.     Mental Status: She is alert and oriented to person, place, and time.  Psychiatric:        Behavior: Behavior normal.        Judgment: Judgment normal.      LABORATORY DATA:  I have reviewed the data as listed Lab Results  Component Value Date   WBC 5.9 11/08/2023   HGB 12.8 11/08/2023   HCT 38.4 11/08/2023   MCV 94.5 11/08/2023   PLT 226.0 11/08/2023   Recent Labs    01/14/23 0815 05/31/23 1013 07/01/23 0840 11/08/23 0753  NA 142 141  --  140  K 3.8 3.6  --  3.6  CL 105 104  --  103  CO2 29 29  --  29  GLUCOSE 88 86  --  86  BUN 17 14  --  13  CREATININE 0.69 0.72  --  0.73  CALCIUM 9.8 9.8  --  10.1  PROT 6.1 6.4 6.2 6.3   ALBUMIN 4.0 4.0 3.9 4.0  AST 16 26 16 18   ALT 16 39* 15 18  ALKPHOS 83 93 81 75  BILITOT 0.5 0.5 0.7 0.5  BILIDIR 0.1 0.1 0.1 0.1    RADIOGRAPHIC STUDIES: I have personally reviewed the radiological images as listed and agreed with the findings in the report. No results found.  ASSESSMENT & PLAN:   Carcinoma of upper-outer quadrant of left breast in female, estrogen receptor positive (HCC) # Left breast stage I breast cancer ER/PR positive HER2 negative [s/p lumpectomy [facility]; Dr.Hwang] ; pT1b pSnLNx-M0- G-2. S/p re-excision [[facility]]- s/p radiation at [facility] on 7/29/ 2024.  JUNE 2024- RS-23; recurrence < 9.   # currently on adjuvant  Anastrozole [x 5 years; till AUG 2029]- tolerating well. 09 days refills sent.   # OSTEOPOROSIS-AUG 2024- BMD [Dr.Scott; ]  T-score of -2.6.  Marland KitchenCurrently on Fosomax- to inform us if reflux or if BMD does not improve in 2026.and then might consider reclast  # Genetic counseling: no pathogenic mutations.   # Vaccination; recommend Flu shots/COVID-   # DISPOSITION: #  follow up in 6  months; MD; No labs-Dr.B  All questions were answered. The patient/family knows to call the clinic with any problems, questions or concerns.    Earna Coder, MD 11/29/2023 12:49 PM

## 2023-11-29 NOTE — Progress Notes (Signed)
Took Covid ( Moderna) shot last week and she had a reaction; pain, fever and red, swollen arm. All her other shots were by [employer] . Otherwise has been feeling well. Appetite and energy were good. No Breast pain or discomfort. Goes to [facility] on Monday for follow up visit post radiation.

## 2023-12-06 DIAGNOSIS — Z170 Estrogen receptor positive status [ER+]: Secondary | ICD-10-CM | POA: Diagnosis not present

## 2023-12-06 DIAGNOSIS — C50412 Malignant neoplasm of upper-outer quadrant of left female breast: Secondary | ICD-10-CM | POA: Diagnosis not present

## 2023-12-12 DIAGNOSIS — R21 Rash and other nonspecific skin eruption: Secondary | ICD-10-CM | POA: Insufficient documentation

## 2023-12-12 NOTE — Assessment & Plan Note (Signed)
Advised patient to use cool compress 2-3 to the affected area. Can use hydrocortisone cream twice daily to reduce redness and itching and benadryl during the night for itching Patient would let us know if symptoms not improving.

## 2024-01-10 ENCOUNTER — Ambulatory Visit (INDEPENDENT_AMBULATORY_CARE_PROVIDER_SITE_OTHER): Payer: Medicare Other | Admitting: *Deleted

## 2024-01-10 VITALS — Ht 62.0 in | Wt 125.0 lb

## 2024-01-10 DIAGNOSIS — Z0000 Encounter for general adult medical examination without abnormal findings: Secondary | ICD-10-CM

## 2024-01-10 NOTE — Progress Notes (Signed)
Subjective:   Jamie Elliott is a 78 y.o. female who presents for Medicare Annual (Subsequent) preventive examination.  Visit Complete: Virtual I connected with  Jamie Elliott on 01/10/24 by a audio enabled telemedicine application and verified that I am speaking with the correct person using two identifiers.This patient declined Interactive audio and Acupuncturist. Therefore the visit was completed with audio only.   Patient Location: Home  Provider Location: Office/Clinic  I discussed the limitations of evaluation and management by telemedicine. The patient expressed understanding and agreed to proceed.  Vital Signs: Because this visit was a virtual/telehealth visit, some criteria may be missing or patient reported. Any vitals not documented were not able to be obtained and vitals that have been documented are patient reported.   Cardiac Risk Factors include: advanced age (>32men, >18 women);dyslipidemia;hypertension     Objective:    Today's Vitals   01/10/24 1300  Weight: 125 lb (56.7 kg)  Height: 5\' 2"  (1.575 m)   Body mass index is 22.86 kg/m.     01/10/2024    1:15 PM 11/29/2023   10:36 AM 09/24/2023   10:30 AM 08/02/2023   10:11 AM 05/13/2023   10:03 AM 05/13/2023    9:51 AM 03/22/2023    1:51 PM  Advanced Directives  Does Patient Have a Medical Advance Directive? Yes Yes Yes Yes Yes Yes Yes  Type of Estate agent of [place];Living will Healthcare Power of [place];Living will Healthcare Power of [place];Living will Healthcare Power of [place];Living will Healthcare Power of [place];Living will Healthcare Power of [place];Living will   Does patient want to make changes to medical advance directive? No - Patient declined No - Patient declined       Copy of Healthcare Power of Attorney in Chart? Yes - validated most recent copy scanned in chart (See row information) No - copy requested         Current Medications  (verified) Outpatient Encounter Medications as of 01/10/2024  Medication Sig   acetaminophen (TYLENOL) 500 MG tablet Take 500 mg by mouth every 6 (six) hours as needed.   alendronate (FOSAMAX) 70 MG tablet Take 1 tablet (70 mg total) by mouth every 7 (seven) days. Take with a full glass of water on an empty stomach.   amLODipine (NORVASC) 5 MG tablet Take 1 tablet (5 mg total) by mouth daily.   anastrozole (ARIMIDEX) 1 MG tablet Take 1 tablet (1 mg total) by mouth daily.   Ascorbic Acid (VITAMIN C) 1000 MG tablet Take 1,000 mg by mouth daily.   atorvastatin (LIPITOR) 40 MG tablet Take 1 tablet (40 mg total) by mouth daily.   cetirizine (ZYRTEC) 10 MG tablet Take 1 tablet (10 mg total) by mouth daily.   Cholecalciferol (VITAMIN D3) 25 MCG (1000 UT) CAPS Take 1 capsule by mouth daily.   CRANBERRY EXTRACT PO Take 1 capsule by mouth 2 (two) times daily.   D-Mannose 500 MG CAPS Take 500 mg by mouth in the morning and at bedtime.   hydrochlorothiazide (HYDRODIURIL) 12.5 MG tablet Take 1 tablet (12.5 mg total) by mouth daily.   losartan (COZAAR) 100 MG tablet Take 1 tablet (100 mg total) by mouth daily.   polyethylene glycol (MIRALAX / GLYCOLAX) 17 g packet Take 17 g by mouth every other day.   Probiotic Product (ALIGN PO) Take by mouth as needed.   triamcinolone (NASACORT) 55 MCG/ACT nasal inhaler Place 2 sprays into the nose as needed.   No facility-administered encounter medications  on file as of 01/10/2024.    Allergies (verified) Ciprofloxacin, Zithromax [azithromycin], Augmentin [amoxicillin-pot clavulanate], and Doxycycline   History: Past Medical History:  Diagnosis Date   Environmental allergies    Hypercholesterolemia    Hypertension    Migraines    Past Surgical History:  Procedure Laterality Date   ABDOMINAL HYSTERECTOMY  12/21/1998   APPENDECTOMY  12/21/1998   BREAST SURGERY  05/2023   at [facility] lumpectomy   CATARACT EXTRACTION W/PHACO Left 04/23/2021   Procedure: CATARACT  EXTRACTION PHACO AND INTRAOCULAR LENS PLACEMENT (IOC) LEFT panoptix toric 10.41 01:22.8 12.6%;  Surgeon: Lockie Mola, MD;  Location: [facility];  Service: Ophthalmology;  Laterality: Left;   CATARACT EXTRACTION W/PHACO Right 05/07/2021   Procedure: CATARACT EXTRACTION PHACO AND INTRAOCULAR LENS PLACEMENT (IOC) RIGHT  panoptix 12.85 01:33.8 13.7%;  Surgeon: Lockie Mola, MD;  Location: [facility];  Service: Ophthalmology;  Laterality: Right;   NOSE SURGERY  12/21/1965   SEPTOPLASTY     SHOULDER ARTHROSCOPY WITH OPEN ROTATOR CUFF REPAIR Right 12/05/2015   Procedure: SHOULDER ARTHROSCOPY WITH MINI OPEN ROTATOR CUFF REPAIR. DISTAL CLAVICLE EXCISION;  Surgeon: Juanell Fairly, MD;  Location: [facility];  Service: Orthopedics;  Laterality: Right;   Family History  Problem Relation Age of Onset   Heart disease Mother    Hyperlipidemia Mother    Hypertension Mother    Lung cancer Father        dx 39s   Hyperlipidemia Father    Hypertension Father    Prostate cancer Father        dx 72s   Breast cancer Maternal Aunt        dx 60s-70s   Breast cancer Maternal Grandmother        dx 90s   Bladder Cancer Neg Hx    Kidney cancer Neg Hx    Social History   Socioeconomic History   Marital status: Married    Spouse name: Not on file   Number of children: 2   Years of education: Not on file   Highest education level: Bachelor's degree (e.g., BA, AB, BS)  Occupational History   Not on file  Tobacco Use   Smoking status: Never   Smokeless tobacco: Never  Vaping Use   Vaping status: Never Used  Substance and Sexual Activity   Alcohol use: No    Alcohol/week: 0.0 standard drinks of alcohol   Drug use: No   Sexual activity: Not Currently  Other Topics Concern   Not on file  Social History Narrative   married   Social Drivers of Corporate investment banker Strain: Low Risk  (01/10/2024)   Overall Financial Resource Strain (CARDIA)    Difficulty of Paying  Living Expenses: Not hard at all  Food Insecurity: No Food Insecurity (01/10/2024)   Hunger Vital Sign    Worried About Running Out of Food in the Last Year: Never true    Ran Out of Food in the Last Year: Never true  Transportation Needs: No Transportation Needs (01/10/2024)   PRAPARE - Administrator, Civil Service (Medical): No    Lack of Transportation (Non-Medical): No  Physical Activity: Sufficiently Active (01/10/2024)   Exercise Vital Sign    Days of Exercise per Week: 5 days    Minutes of Exercise per Session: 40 min  Stress: No Stress Concern Present (01/10/2024)   [employer] of Occupational Health - Occupational Stress Questionnaire    Feeling of Stress : Not at all  Social Connections: Socially Integrated (01/10/2024)   Social Connection and Isolation Panel [NHANES]    Frequency of Communication with Friends and Family: More than three times a week    Frequency of Social Gatherings with Friends and Family: Twice a week    Attends Religious Services: More than 4 times per year    Active Member of [employer] or Organizations: Yes    Attends Engineer, structural: More than 4 times per year    Marital Status: Married    Tobacco Counseling Counseling given: Not Answered   Clinical Intake:  Pre-visit preparation completed: Yes  Pain : No/denies pain     BMI - recorded: 22.86 Nutritional Status: BMI of 19-24  Normal Nutritional Risks: None Diabetes: No  How often do you need to have someone help you when you read instructions, pamphlets, or other written materials from your doctor or pharmacy?: 1 - Never  Interpreter Needed?: No  Information entered by :: R. Makendra Vigeant LPN   Activities of Daily Living    01/10/2024    1:01 PM 01/05/2024    9:31 AM  In your present state of health, do you have any difficulty performing the following activities:  Hearing? 0 0  Vision? 0 0  Comment readers   Difficulty concentrating or making decisions? 0 0   Walking or climbing stairs? 0 0  Dressing or bathing? 0 0  Doing errands, shopping? 0 0  Preparing Food and eating ? N N  Using the Toilet? N N  In the past six months, have you accidently leaked urine? N N  Do you have problems with loss of bowel control? N N  Managing your Medications? N N  Managing your Finances? N N  Housekeeping or managing your Housekeeping? N N    Patient Care Team: [place], MD as PCP - General (Internal Medicine) Hulen Luster, RN as Oncology Nurse Navigator Earna Coder, MD as Consulting Physician (Oncology)  Indicate any recent Medical Services you may have received from other than [facility] providers in the past year (date may be approximate).     Assessment:   This is a routine wellness examination for [facility].  Hearing/Vision screen Hearing Screening - Comments:: No issues Vision Screening - Comments:: readers   Goals Addressed             This Visit's Progress    Patient Stated       Wants to get into an exercise program       Depression Screen    01/10/2024    1:08 PM 08/27/2023   11:06 AM 08/19/2023    8:07 AM 03/15/2023   11:00 AM 01/04/2023   10:29 AM 10/21/2022    9:34 AM 07/17/2022    3:11 PM  PHQ 2/9 Scores  PHQ - 2 Score 0 0 0 0 0 0 0  PHQ- 9 Score 0 0 0 0       Fall Risk    01/10/2024    1:03 PM 01/05/2024    9:31 AM 11/26/2023    1:46 PM 08/27/2023   11:05 AM 08/19/2023    8:07 AM  Fall Risk   Falls in the past year? 0 0 0 0 0  Number falls in past yr: 0 0 0 0 0  Injury with Fall? 0 0 0 0 0  Risk for fall due to : No Fall Risks  No Fall Risks No Fall Risks;History of fall(s) No Fall Risks  Follow up Falls prevention  discussed;Falls evaluation completed  Falls evaluation completed  Falls evaluation completed    MEDICARE RISK AT HOME: Medicare Risk at Home Any stairs in or around the home?: Yes If so, are there any without handrails?: No Home free of loose throw rugs in walkways, pet beds, electrical cords,  etc?: Yes Adequate lighting in your home to reduce risk of falls?: Yes Life alert?: Yes Use of a cane, walker or w/c?: No Grab bars in the bathroom?: Yes Shower chair or bench in shower?: No Elevated toilet seat or a handicapped toilet?: Yes   Cognitive Function:    10/04/2018    9:25 AM 09/30/2017    9:19 AM 09/30/2016    9:56 AM 09/30/2015   10:26 AM  MMSE - Mini Mental State Exam  Orientation to time 5 5 5 5   Orientation to Place 5 5 5 5   Registration 3 3 3 3   Attention/ Calculation 5 5 5 5   Recall 3 3 3 3   Language- name 2 objects 2 2 2 2   Language- repeat 1 1 1 1   Language- follow 3 step command 3 3 3 3   Language- read & follow direction 1 1 1 1   Write a sentence 1 1 1 1   Copy design 1 1 1 1   Total score 30 30 30 30         01/10/2024    1:15 PM 01/04/2023   10:30 AM 12/08/2019    9:53 AM  6CIT Screen  What Year? 0 points 0 points 0 points  What month? 0 points 0 points 0 points  What time? 0 points 0 points 0 points  Count back from 20 0 points 0 points 0 points  Months in reverse 0 points 0 points 0 points  Repeat phrase 0 points 0 points 2 points  Total Score 0 points 0 points 2 points    Immunizations Immunization History  Administered Date(s) Administered   Fluad Quad(high Dose 65+) 09/29/2021, 09/10/2022   Fluad Trivalent(High Dose 65+) 08/19/2023   Influenza Split 10/27/2012   Influenza, High Dose Seasonal PF 09/30/2016, 10/04/2018   Influenza,inj,Quad PF,6+ Mos 08/16/2014, 08/20/2015   Influenza,inj,quad, With Preservative 10/20/2017, 10/17/2019   Influenza-Unspecified 09/25/2020, 09/10/2022   Moderna Covid-19 Fall Seasonal Vaccine 41yrs & older 10/21/2022, 11/23/2023   PFIZER(Purple Top)SARS-COV-2 Vaccination 02/01/2020, 02/22/2020, 09/25/2020   Pfizer Covid-19 Vaccine Bivalent Booster 87yrs & up 10/14/2021   Pneumococcal Conjugate-13 10/17/2019   Pneumococcal Polysaccharide-23 06/05/2013   Respiratory Syncytial Virus Vaccine,Recomb  Aduvanted(Arexvy) 09/29/2022   Rsv, Bivalent, Protein Subunit Rsvpref,pf Verdis Frederickson) 09/29/2022   Tdap 01/11/2013   Zoster, Live 06/04/2011    TDAP status: Due, Education has been provided regarding the importance of this vaccine. Advised may receive this vaccine at local pharmacy or Health Dept. Aware to provide a copy of the vaccination record if obtained from local pharmacy or Health Dept. Verbalized acceptance and understanding.  Flu Vaccine status: Up to date  Pneumococcal vaccine status: Up to date  Covid-19 vaccine status: Declined, Education has been provided regarding the importance of this vaccine but patient still declined. Advised may receive this vaccine at local pharmacy or Health Dept.or vaccine clinic. Aware to provide a copy of the vaccination record if obtained from local pharmacy or Health Dept. Verbalized acceptance and understanding.  Qualifies for Shingles Vaccine? Yes   Zostavax completed Yes   Shingrix Completed?: No.    Education has been provided regarding the importance of this vaccine. Patient has been advised to call insurance company to determine out  of pocket expense if they have not yet received this vaccine. Advised may also receive vaccine at local pharmacy or Health Dept. Verbalized acceptance and understanding.  Screening Tests Health Maintenance  Topic Date Due   Zoster Vaccines- Shingrix (1 of 2) 01[birth year]27/1965   DTaP/Tdap/Td (2 - Td or Tdap) 01/11/2023   Medicare Annual Wellness (AWV)  01/05/2024   COVID-19 Vaccine (7 - 2024-25 season) 01/18/2024   MAMMOGRAM  02/25/2024   Pneumonia Vaccine 90+ Years old  Completed   INFLUENZA VACCINE  Completed   DEXA SCAN  Completed   Hepatitis C Screening  Completed   HPV VACCINES  Aged Out   Colonoscopy  Discontinued    Health Maintenance  Health Maintenance Due  Topic Date Due   Zoster Vaccines- Shingrix (1 of 2) 01/27/1965   DTaP/Tdap/Td (2 - Td or Tdap) 01/11/2023   Medicare Annual Wellness (AWV)   01/05/2024    Colorectal cancer screening: No longer required.   Mammogram status: Completed 02/2023. Repeat every year  Bone Density status: Completed 07/2023. Results reflect: Bone density results: OSTEOPOROSIS. Repeat every 2 years.  Lung Cancer Screening: (Low Dose CT Chest recommended if Age 72-80 years, 20 pack-year currently smoking OR have quit w/in 15years.) does not qualify.    Additional Screening:  Hepatitis C Screening: does qualify; Completed 09/2015  Vision Screening: Recommended annual ophthalmology exams for early detection of glaucoma and other disorders of the eye. Is the patient up to date with their annual eye exam?  Yes  Who is the provider or what is the name of the office in which the patient attends annual eye exams? [facility] If pt is not established with a provider, would they like to be referred to a provider to establish care? No .   Dental Screening: Recommended annual dental exams for proper oral hygiene   Community Resource Referral / Chronic Care Management: CRR required this visit?  No   CCM required this visit?  No     Plan:     I have personally reviewed and noted the following in the patient's chart:   Medical and social history Use of alcohol, tobacco or illicit drugs  Current medications and supplements including opioid prescriptions. Patient is not currently taking opioid prescriptions. Functional ability and status Nutritional status Physical activity Advanced directives List of other physicians Hospitalizations, surgeries, and ER visits in previous 12 months Vitals Screenings to include cognitive, depression, and falls Referrals and appointments  In addition, I have reviewed and discussed with patient certain preventive protocols, quality metrics, and best practice recommendations. A written personalized care plan for preventive services as well as general preventive health recommendations were provided to patient.      Sydell Axon, LPN   1/61/0960   After Visit Summary: (MyChart) Due to this being a telephonic visit, the after visit summary with patients personalized plan was offered to patient via MyChart   Nurse Notes: None

## 2024-01-10 NOTE — Patient Instructions (Signed)
Jamie Elliott , Thank you for taking time to come for your Medicare Wellness Visit. I appreciate your ongoing commitment to your health goals. Please review the following plan we discussed and let me know if I can assist you in the future.   Referrals/Orders/Follow-Ups/Clinician Recommendations: Remember to discuss your vaccines with your PCP at next visit.  This is a list of the screening recommended for you and due dates:  Health Maintenance  Topic Date Due   Zoster (Shingles) Vaccine (1 of 2) 01/27/1965   DTaP/Tdap/Td vaccine (2 - Td or Tdap) 01/11/2023   COVID-19 Vaccine (7 - 2024-25 season) 01/18/2024   Mammogram  02/25/2024   Medicare Annual Wellness Visit  01/09/2025   Pneumonia Vaccine  Completed   Flu Shot  Completed   DEXA scan (bone density measurement)  Completed   Hepatitis C Screening  Completed   HPV Vaccine  Aged Out   Colon Cancer Screening  Discontinued    Advanced directives: (In Chart) A copy of your advanced directives are scanned into your chart should your provider ever need it.  Next Medicare Annual Wellness Visit scheduled for next year: Yes 01/12/25 @ 8:50

## 2024-01-14 ENCOUNTER — Ambulatory Visit (INDEPENDENT_AMBULATORY_CARE_PROVIDER_SITE_OTHER): Payer: Medicare Other | Admitting: Internal Medicine

## 2024-01-14 ENCOUNTER — Encounter: Payer: Self-pay | Admitting: Internal Medicine

## 2024-01-14 VITALS — BP 120/70 | HR 80 | Temp 98.0°F | Resp 16 | Ht 60.0 in | Wt 124.2 lb

## 2024-01-14 DIAGNOSIS — Z170 Estrogen receptor positive status [ER+]: Secondary | ICD-10-CM

## 2024-01-14 DIAGNOSIS — F439 Reaction to severe stress, unspecified: Secondary | ICD-10-CM

## 2024-01-14 DIAGNOSIS — I10 Essential (primary) hypertension: Secondary | ICD-10-CM | POA: Diagnosis not present

## 2024-01-14 DIAGNOSIS — K5909 Other constipation: Secondary | ICD-10-CM

## 2024-01-14 DIAGNOSIS — E7800 Pure hypercholesterolemia, unspecified: Secondary | ICD-10-CM

## 2024-01-14 DIAGNOSIS — M810 Age-related osteoporosis without current pathological fracture: Secondary | ICD-10-CM | POA: Diagnosis not present

## 2024-01-14 DIAGNOSIS — C50412 Malignant neoplasm of upper-outer quadrant of left female breast: Secondary | ICD-10-CM

## 2024-01-14 DIAGNOSIS — D219 Benign neoplasm of connective and other soft tissue, unspecified: Secondary | ICD-10-CM

## 2024-01-14 NOTE — Assessment & Plan Note (Signed)
Saw urology 06/26/22 - recommended f/u ultrasound in 05/2023. F/u ultrasound 04/2023 - reviewed. Continue f/u with urology.

## 2024-01-14 NOTE — Assessment & Plan Note (Signed)
Continue calcium and vitamin D. Currently taking fosamax weekly. Reported increased acid reflux. Discussed changing to reclast.  Agreeable.  Referral to endocrinology.

## 2024-01-14 NOTE — Assessment & Plan Note (Signed)
Continue miralax and continues water intake.  Follow.

## 2024-01-14 NOTE — Progress Notes (Signed)
Subjective:    Patient ID: Jamie Elliott, female    DOB: October 08, 1946, 78 y.o.   MRN: [id number]  Patient here for  Chief Complaint  Patient presents with   Medical Management of Chronic Issues    HPI Here for a scheduled follow up - follow up regarding hypercholesterolemia and hypertension. Recently diagnosed with breast cancer. Is s/p left breast partial mastectomy 04/22/23, with f/u surgery 05/20/23. XRT. Had f/u [facility] - 10/15/23 - recommended f/u and diagnostic mammogram in 05/2024. Saw Dr Donneta Romberg 11/29/23 - continues on anastrozole. Taking fosamax. Reports since starting fosamax, she has noticed increased acid reflux.  Discussed other treatment options. Discussed reclast. Agreeable for referral. Reports breathing stable. No chest pain. Some constipation. Miralax and water - to help control. Increased stress.  Overall appears to be handling things relatively well.    Past Medical History:  Diagnosis Date   Environmental allergies    Hypercholesterolemia    Hypertension    Migraines    Past Surgical History:  Procedure Laterality Date   ABDOMINAL HYSTERECTOMY  12/21/1998   APPENDECTOMY  12/21/1998   BREAST SURGERY  05/2023   at [facility] lumpectomy   CATARACT EXTRACTION W/PHACO Left 04/23/2021   Procedure: CATARACT EXTRACTION PHACO AND INTRAOCULAR LENS PLACEMENT (IOC) LEFT panoptix toric 10.41 01:22.8 12.6%;  Surgeon: Lockie Mola, MD;  Location: [facility];  Service: Ophthalmology;  Laterality: Left;   CATARACT EXTRACTION W/PHACO Right 05/07/2021   Procedure: CATARACT EXTRACTION PHACO AND INTRAOCULAR LENS PLACEMENT (IOC) RIGHT  panoptix 12.85 01:33.8 13.7%;  Surgeon: Lockie Mola, MD;  Location: [facility];  Service: Ophthalmology;  Laterality: Right;   NOSE SURGERY  12/21/1965   SEPTOPLASTY     SHOULDER ARTHROSCOPY WITH OPEN ROTATOR CUFF REPAIR Right 12/05/2015   Procedure: SHOULDER ARTHROSCOPY WITH MINI OPEN ROTATOR CUFF REPAIR. DISTAL CLAVICLE  EXCISION;  Surgeon: Juanell Fairly, MD;  Location: [facility] ORS;  Service: Orthopedics;  Laterality: Right;   Family History  Problem Relation Age of Onset   Heart disease Mother    Hyperlipidemia Mother    Hypertension Mother    Lung cancer Father        dx 78s   Hyperlipidemia Father    Hypertension Father    Prostate cancer Father        dx 37s   Breast cancer Maternal Aunt        dx 60s-70s   Breast cancer Maternal Grandmother        dx 90s   Bladder Cancer Neg Hx    Kidney cancer Neg Hx    Social History   Socioeconomic History   Marital status: Married    Spouse name: Not on file   Number of children: 2   Years of education: Not on file   Highest education level: Bachelor's degree (e.g., BA, AB, BS)  Occupational History   Not on file  Tobacco Use   Smoking status: Never   Smokeless tobacco: Never  Vaping Use   Vaping status: Never Used  Substance and Sexual Activity   Alcohol use: No    Alcohol/week: 0.0 standard drinks of alcohol   Drug use: No   Sexual activity: Not Currently  Other Topics Concern   Not on file  Social History Narrative   married   Social Drivers of Corporate investment banker Strain: Low Risk  (01/10/2024)   Overall Financial Resource Strain (CARDIA)    Difficulty of Paying Living Expenses: Not hard at all  Food Insecurity: No  Food Insecurity (01/10/2024)   Hunger Vital Sign    Worried About Running Out of Food in the Last Year: Never true    Ran Out of Food in the Last Year: Never true  Transportation Needs: No Transportation Needs (01/10/2024)   PRAPARE - Administrator, Civil Service (Medical): No    Lack of Transportation (Non-Medical): No  Physical Activity: Sufficiently Active (01/10/2024)   Exercise Vital Sign    Days of Exercise per Week: 5 days    Minutes of Exercise per Session: 40 min  Stress: No Stress Concern Present (01/10/2024)   [employer] of Occupational Health - Occupational Stress Questionnaire     Feeling of Stress : Not at all  Social Connections: Socially Integrated (01/10/2024)   Social Connection and Isolation Panel [NHANES]    Frequency of Communication with Friends and Family: More than three times a week    Frequency of Social Gatherings with Friends and Family: Twice a week    Attends Religious Services: More than 4 times per year    Active Member of [employer] or Organizations: Yes    Attends Engineer, structural: More than 4 times per year    Marital Status: Married     Review of Systems  Constitutional:  Negative for appetite change and unexpected weight change.  HENT:  Negative for congestion and sinus pressure.   Respiratory:  Negative for cough, chest tightness and shortness of breath.   Cardiovascular:  Negative for chest pain, palpitations and leg swelling.  Gastrointestinal:  Negative for abdominal pain, diarrhea, nausea and vomiting.       Acid reflux as outlined.   Genitourinary:  Negative for difficulty urinating and dysuria.  Musculoskeletal:  Negative for joint swelling and myalgias.  Skin:  Negative for color change and rash.  Neurological:  Negative for dizziness and headaches.  Psychiatric/Behavioral:  Negative for agitation and dysphoric mood.        Objective:     BP 120/70   Pulse 80   Temp 98 F (36.7 C)   Resp 16   Ht 5' (1.524 m)   Wt 124 lb 3.2 oz (56.3 kg)   LMP 11/28/1998   SpO2 98%   BMI 24.26 kg/m  Wt Readings from Last 3 Encounters:  01/14/24 124 lb 3.2 oz (56.3 kg)  01/10/24 125 lb (56.7 kg)  11/29/23 123 lb 12.8 oz (56.2 kg)    Physical Exam Vitals reviewed.  Constitutional:      General: She is not in acute distress.    Appearance: Normal appearance.  HENT:     Head: Normocephalic and atraumatic.     Right Ear: External ear normal.     Left Ear: External ear normal.     Mouth/Throat:     Pharynx: No oropharyngeal exudate or posterior oropharyngeal erythema.  Eyes:     General: No scleral icterus.       Right  eye: No discharge.        Left eye: No discharge.     Conjunctiva/sclera: Conjunctivae normal.  Neck:     Thyroid: No thyromegaly.  Cardiovascular:     Rate and Rhythm: Normal rate and regular rhythm.  Pulmonary:     Effort: No respiratory distress.     Breath sounds: Normal breath sounds. No wheezing.  Abdominal:     General: Bowel sounds are normal.     Palpations: Abdomen is soft.     Tenderness: There is no abdominal tenderness.  Musculoskeletal:  General: No swelling or tenderness.     Cervical back: Neck supple. No tenderness.  Lymphadenopathy:     Cervical: No cervical adenopathy.  Skin:    Findings: No erythema or rash.  Neurological:     Mental Status: She is alert.  Psychiatric:        Mood and Affect: Mood normal.        Behavior: Behavior normal.         Outpatient Encounter Medications as of 01/14/2024  Medication Sig   acetaminophen (TYLENOL) 500 MG tablet Take 500 mg by mouth every 6 (six) hours as needed.   alendronate (FOSAMAX) 70 MG tablet Take 1 tablet (70 mg total) by mouth every 7 (seven) days. Take with a full glass of water on an empty stomach.   amLODipine (NORVASC) 5 MG tablet Take 1 tablet (5 mg total) by mouth daily.   anastrozole (ARIMIDEX) 1 MG tablet Take 1 tablet (1 mg total) by mouth daily.   Ascorbic Acid (VITAMIN C) 1000 MG tablet Take 1,000 mg by mouth daily.   atorvastatin (LIPITOR) 40 MG tablet Take 1 tablet (40 mg total) by mouth daily.   cetirizine (ZYRTEC) 10 MG tablet Take 1 tablet (10 mg total) by mouth daily.   Cholecalciferol (VITAMIN D3) 25 MCG (1000 UT) CAPS Take 1 capsule by mouth daily.   CRANBERRY EXTRACT PO Take 1 capsule by mouth 2 (two) times daily.   D-Mannose 500 MG CAPS Take 500 mg by mouth in the morning and at bedtime.   hydrochlorothiazide (HYDRODIURIL) 12.5 MG tablet Take 1 tablet (12.5 mg total) by mouth daily.   losartan (COZAAR) 100 MG tablet Take 1 tablet (100 mg total) by mouth daily.   polyethylene  glycol (MIRALAX / GLYCOLAX) 17 g packet Take 17 g by mouth every other day.   Probiotic Product (ALIGN PO) Take by mouth as needed.   triamcinolone (NASACORT) 55 MCG/ACT nasal inhaler Place 2 sprays into the nose as needed.   No facility-administered encounter medications on file as of 01/14/2024.     Lab Results  Component Value Date   WBC 5.9 11/08/2023   HGB 12.8 11/08/2023   HCT 38.4 11/08/2023   PLT 226.0 11/08/2023   GLUCOSE 86 11/08/2023   CHOL 159 11/08/2023   TRIG 134.0 11/08/2023   HDL 48.00 11/08/2023   LDLDIRECT 103.0 05/21/2015   LDLCALC 84 11/08/2023   ALT 18 11/08/2023   AST 18 11/08/2023   NA 140 11/08/2023   K 3.6 11/08/2023   CL 103 11/08/2023   CREATININE 0.73 11/08/2023   BUN 13 11/08/2023   CO2 29 11/08/2023   TSH 1.75 01/14/2023   INR 1.0 03/17/2022    DG Bone Density Result Date: 08/09/2023 EXAM: DUAL X-RAY ABSORPTIOMETRY (DXA) FOR BONE MINERAL DENSITY IMPRESSION: Your patient Maddilyn Campus completed a BMD test on 08/09/2023 using the [employer] DXA System (software version: 14.10) manufactured by [employer]. The following summarizes the results of our evaluation. Technologist:VLM PATIENT BIOGRAPHICAL: Name: Ronnetta, Currington Patient ID: [id number] Birth Date: June 07, 1946 Height: 62.0 in. Gender: Female Exam Date: 08/09/2023 Weight: 125.6 lbs. Indications: Caucasian, History of Breast Cancer, Hysterectomy, Postmenopausal Fractures: Treatments: calcium w/ vit D DENSITOMETRY RESULTS: Site          Region     Measured Date Measured Age WHO Classification Young Adult T-score BMD         %Change vs. Previous Significant Change (*) Right Forearm Radius 33% 08/09/2023 77.5 Osteopenia -1.6 0.737  g/cm2 - - DualFemur Total Left 8/09/2023 77.5 Osteoporosis -2.6 0.686 g/cm2 - - ASSESSMENT: The BMD measured at Femur Total Left is 0.686 g/cm2 with a T-score of -2.6. This patient is considered osteoporotic according to World Health Organization [facility]) criteria. Lumbar  spine was not utilized due to advanced degenerative changes and scoliosis. The scan quality is good. World Science writer [facility]) criteria for post-menopausal, Caucasian Women: Normal:                   T-score at or above -1 SD Osteopenia/low bone mass: T-score between -1 and -2.5 SD Osteoporosis:             T-score at or below -2.5 SD RECOMMENDATIONS: 1. All patients should optimize calcium and vitamin D intake. 2. Consider FDA-approved medical therapies in postmenopausal women and men aged 3 years and older, based on the following: a. A hip or vertebral(clinical or morphometric) fracture b. T-score < -2.5 at the femoral neck or spine after appropriate evaluation to exclude secondary causes c. Low bone mass (T-score between -1.0 and -2.5 at the femoral neck or spine) and a 10-year probability of a hip fracture > 3% or a 10-year probability of a major osteoporosis-related fracture > 20% based on the [place]-adapted WHO algorithm 3. Clinician judgment and/or patient preferences may indicate treatment for people with 10-year fracture probabilities above or below these levels FOLLOW-UP: People with diagnosed cases of osteoporosis or at high risk for fracture should have regular bone mineral density tests. For patients eligible for Medicare, routine testing is allowed once every 2 years. The testing frequency can be increased to one year for patients who have rapidly progressing disease, those who are receiving or discontinuing medical therapy to restore bone mass, or have additional risk factors. I have reviewed this report, and agree with the above findings. [facility] Electronically Signed   By: Frederico Hamman M.D.   On: 08/09/2023 14:57       Assessment & Plan:  Hypercholesterolemia Assessment & Plan: Continue lipitor.  Low cholesterol diet and exercise.  Follow lipid panel and liver function tests.   Orders: -     Lipid panel; Future -     Hepatic function panel; Future -     Basic  metabolic panel; Future -     TSH; Future  Stress Assessment & Plan: Increased stress. Overall appears to be handling things well. Does not feel needs any further intervention at this time. Follow.     Osteoporosis without current pathological fracture, unspecified osteoporosis type Assessment & Plan: Continue calcium and vitamin D. Currently taking fosamax weekly. Reported increased acid reflux. Discussed changing to reclast.  Agreeable.  Referral to endocrinology.   Orders: -     Ambulatory referral to Endocrinology  Primary hypertension Assessment & Plan: Continue amlodipine and hctz.  Follow pressures.  Follow metabolic panel.     Other constipation Assessment & Plan: Continue miralax and continues water intake.  Follow.    Carcinoma of upper-outer quadrant of left breast in female, estrogen receptor positive (HCC) Assessment & Plan: Left breast stage I breast cancer ER/PR positive HER2 negative [s/p lumpectomy [facility]; Dr.Hwang] ; pT1b pSnLNx-M0- G-2. S/p left breast partial mastectomy 04/22/23. Had to go back for f/u surgery 05/20/23.  XRT.  Incision - closed.  Started anastrozole. Previously breast lesion - resolved.  Follow.    Angioleiomyoma Assessment & Plan: Saw urology 06/26/22 - recommended f/u ultrasound in 05/2023. F/u ultrasound 04/2023 - reviewed. Continue f/u with  urology.       Dale [place], MD

## 2024-01-14 NOTE — Assessment & Plan Note (Signed)
Left breast stage I breast cancer ER/PR positive HER2 negative [s/p lumpectomy [facility]; Dr.Hwang] ; pT1b pSnLNx-M0- G-2. S/p left breast partial mastectomy 04/22/23. Had to go back for f/u surgery 05/20/23.  XRT.  Incision - closed.  Started anastrozole. Previously breast lesion - resolved.  Follow.

## 2024-01-14 NOTE — Assessment & Plan Note (Signed)
Continue lipitor.  Low cholesterol diet and exercise.  Follow lipid panel and liver function tests.

## 2024-01-14 NOTE — Assessment & Plan Note (Signed)
Continue amlodipine and hctz.  Follow pressures.  Follow metabolic panel.

## 2024-01-14 NOTE — Assessment & Plan Note (Signed)
Increased stress. Overall appears to be handling things well. Does not feel needs any further intervention at this time. Follow.

## 2024-01-19 ENCOUNTER — Inpatient Hospital Stay: Payer: Medicare Other | Attending: Internal Medicine | Admitting: Occupational Therapy

## 2024-01-19 DIAGNOSIS — L905 Scar conditions and fibrosis of skin: Secondary | ICD-10-CM

## 2024-01-19 NOTE — Therapy (Signed)
[facility] - A Dept Of [place]. [facility] [address] [place], [place], [address] Phone: [phone]   Fax:  [phone]  Occupational Therapy Screen  Patient Details  Name: Jamie Elliott MRN: [id number] Date of Birth: 10-01-1946 No data recorded  Encounter Date: 01/19/2024   OT End of Session - 01/19/24 1827     Visit Number 0             Past Medical History:  Diagnosis Date   Environmental allergies    Hypercholesterolemia    Hypertension    Migraines     Past Surgical History:  Procedure Laterality Date   ABDOMINAL HYSTERECTOMY  12/21/1998   APPENDECTOMY  12/21/1998   BREAST SURGERY  05/2023   at [facility] lumpectomy   CATARACT EXTRACTION W/PHACO Left 04/23/2021   Procedure: CATARACT EXTRACTION PHACO AND INTRAOCULAR LENS PLACEMENT (IOC) LEFT panoptix toric 10.41 01:22.8 12.6%;  Surgeon: Lockie Mola, MD;  Location: [facility];  Service: Ophthalmology;  Laterality: Left;   CATARACT EXTRACTION W/PHACO Right 05/07/2021   Procedure: CATARACT EXTRACTION PHACO AND INTRAOCULAR LENS PLACEMENT (IOC) RIGHT  panoptix 12.85 01:33.8 13.7%;  Surgeon: Lockie Mola, MD;  Location: [facility];  Service: Ophthalmology;  Laterality: Right;   NOSE SURGERY  12/21/1965   SEPTOPLASTY     SHOULDER ARTHROSCOPY WITH OPEN ROTATOR CUFF REPAIR Right 12/05/2015   Procedure: SHOULDER ARTHROSCOPY WITH MINI OPEN ROTATOR CUFF REPAIR. DISTAL CLAVICLE EXCISION;  Surgeon: Juanell Fairly, MD;  Location: [facility] ORS;  Service: Orthopedics;  Laterality: Right;    There were no vitals filed for this visit.        LYMPHEDEMA/ONCOLOGY QUESTIONNAIRE - 01/19/24 0001       Right Upper Extremity Lymphedema   15 cm Proximal to Olecranon Process 25.5 cm    10 cm Proximal to Olecranon Process 23.5 cm    Olecranon Process 20.5 cm      Left Upper Extremity Lymphedema   15 cm Proximal to Olecranon Process 25 cm    10  cm Proximal to Olecranon Process 23.5 cm    Olecranon Process 20.5 cm              12/05/24 [facility] appt: INTERVAL HISTORY: Jamie Elliott returns today for routine management of her left breast cancer. She was most recent seen in RadOnc clinic in July 2024 while receiving radiation therapy. She has recovered nicely from her acute radiation related toxicities and is feeling overall well today. She did develop left breast abscess/cellulitis in late August which required antibiotics.  09/01/23: Mammo/US limited left: IMPRESSION: Ill-defined phlegmonous changes in the left breast at 2:00, 8 cm from nipple, corresponding with site of skin redness/palpable abnormality. This extends to the skin surface, consistent with patient reported history of recent yellow colored, cloudy/thick discharge. There is no discrete, organized fluid collection or drainable abscess. RECOMMENDATION: Clinical Follow-up is recommended for the left breast.  Today she reports significant improvement. Mild tenderness noted and mild erythema noted at this time. She does continue to apply warm compresses if needed which she finds to be very helpful.  She has also noticed some mild tenderness in her left axilla and a little pulling sensation when lifting her arm above her head. This does not create any specific limitations for her.   Continues followup with medical oncology team and surgical oncology team without any evidence of recurrent disease.  In the interim she started on Arimidex and is tolerating  it well overall without any reported side effects today.     OT SCREEN 01/19/24: Patient return after being seen earlier last year for preop lumpectomy assessment and education for home program. Patient returned this date after finishing up all her treatment.  Patient had lumpectomy followed by radiation.  But had another surgery in the same incision. Patient ended up with cellulitis that resulted in a few rounds of  antibiotic. Patient referred for increased discomfort on left lateral breast around the scar in combination with range of motion overhead resulting in a slight pull. Patient scar tender and fibrotic.  Patient was educated on scar massage to be done 2 times a day for 2 to 3 minutes.  As well as OT done mini massager with great results with decrease scar tissue as well as decreased pain. Patient to follow-up if needed in 3 to 4 weeks to reassess if need chip bag. Patient ask OT about exercises for her post cancer treatment as well as with her risk for osteoporosis. Discussed with patient possible referral to [employer] exercise program that starting in March.                                Visit Diagnosis: Scar condition and fibrosis of skin    Problem List Patient Active Problem List   Diagnosis Date Noted   Rash 12/12/2023   Cellulitis of breast 10/04/2023   Genetic testing 05/24/2023   Carcinoma of upper-outer quadrant of left breast in female, estrogen receptor positive (HCC) 03/22/2023   Viral URI 03/15/2023   Headache 01/14/2023   Fall 08/23/2022   Glossitis 05/02/2022   Aphthous ulcer 04/11/2022   Leukocytosis 04/07/2022   Viral syndrome 03/28/2022   Respiratory tract infection 03/17/2022   Nausea and vomiting 03/17/2022   Vitamin D deficiency 12/11/2021   Mouth sores 10/19/2021   Exposure to COVID-19 virus 08/24/2021   Abnormal mammogram 06/26/2021   History of COVID-19 06/18/2021   Bleeding 03/15/2021   Left foot pain 03/04/2021   Fatigue 01/05/2021   Angioleiomyoma 11/18/2020   Nasal drainage 08/12/2020   Stress 04/22/2020   Sinus congestion 11/08/2019   Fever 09/17/2019   Allergy 07/02/2019   Low back pain 12/11/2018   Left arm pain 07/24/2018   Right hip pain 05/24/2018   Left leg swelling 09/03/2017   Chest pressure 06/16/2017   Altered sensation of foot 06/16/2017   Recurrent UTI 05/12/2017   Constipation 02/16/2016   UTI  (urinary tract infection) 01/06/2016   Pain in joint, shoulder region 12/05/2015   Loss of weight 11/25/2015   History of abnormal mammogram 08/26/2015   Health care maintenance 05/19/2015   Osteoporosis 04/17/2014   Abnormal liver function tests 12/17/2013   HTN (hypertension) 12/04/2012   Hypercholesterolemia 12/04/2012   Environmental allergies 12/04/2012    Oletta Cohn, OTR/L,CLT 01/19/2024, 6:30 PM  [facility] - A Dept Of [place] [address] [place], [place], [address] Phone: [phone]   Fax:  [phone]  Name: Jamie Elliott MRN: [id number] Date of Birth: [birth year]

## 2024-01-20 ENCOUNTER — Other Ambulatory Visit: Payer: Self-pay | Admitting: Internal Medicine

## 2024-01-24 ENCOUNTER — Telehealth: Payer: Self-pay

## 2024-01-24 NOTE — Telephone Encounter (Signed)
Copied from CRM [id number]. Topic: Referral - Question >> Jan 24, 2024  4:22 PM Taleah C wrote: Reason for CRM: pt stated that she has a referral to see an endocrinologist but she wants to know if it can be re-routed to an office in [place]. She explained that she already told [facility] she wants the referral switched. Please follow-up and advise with patient.

## 2024-01-25 NOTE — Telephone Encounter (Signed)
Advised patient that referral was placed to [facility] endocrinology. Number provided to patient.

## 2024-01-26 ENCOUNTER — Ambulatory Visit (INDEPENDENT_AMBULATORY_CARE_PROVIDER_SITE_OTHER): Payer: Medicare Other | Admitting: Internal Medicine

## 2024-01-26 ENCOUNTER — Encounter: Payer: Self-pay | Admitting: Internal Medicine

## 2024-01-26 VITALS — BP 114/68 | HR 75 | Temp 98.1°F | Ht 60.0 in | Wt 123.0 lb

## 2024-01-26 DIAGNOSIS — J069 Acute upper respiratory infection, unspecified: Secondary | ICD-10-CM

## 2024-01-26 NOTE — Assessment & Plan Note (Signed)
-  Patient presented today for nasal/sinus congestion for the last 3 days.  She also complains of some pressure over her ear.  Patient states that she did have some mild chills and a low-grade fever on the first day as well as associated malaise.  She states that her malaise is improved but she still has persistent congestion. -On exam today, she had no frontal or maxillary sinus tenderness.  She did have some enlarged nasal turbinates with erythema.  No pharyngeal erythema or exudate noted.  Lungs were clear to auscultation bilaterally.  Ear exam was within normal limits. -Patient states that her symptoms are already improving.  I suspect this is likely secondary to a viral URI. -No further workup required at this time -Will continue with conservative measures including saline nasal rinses, Nasacort  and cetirizine  as well as Nettie pot for her congestion and warm showers with steam inhalation.

## 2024-01-26 NOTE — Patient Instructions (Signed)
-  It was a pleasure meeting you today -I suspect that you have a viral upper respiratory tract infection that is causing your sinus congestion -I am glad that you are feeling better today.   -No indication for antibiotics currently -Consider trying an over-the-counter Nettie pot in addition to your saline nasal rinses, Nasacort  and cetirizine  -Try warm showers with steam inhalation as well to see if this will help with your congestion -Please contact with any questions or concerns or if your symptoms worsen -Please contact [facility] to make your endocrinology appointment

## 2024-01-26 NOTE — Progress Notes (Signed)
 Acute Office Visit  Subjective:     Patient ID: Jamie Elliott, female    DOB: 04/05/1946, 78 y.o.   MRN: [id number]  Chief Complaint  Patient presents with   Acute Visit    Sinus pain & pressure, sore neck glands, bilateral ear pressure and no energy    HPI Patient is in today for sinus congestion for the last 3 days.  Patient states that 3 days ago she developed congestion over her maxillary and frontal sinuses as well as associated headaches.  On the first day she did have mild chills and a low-grade fever.  She has complaints of some intermittent cough productive of yellowish phlegm.  No sore throat.  No chest pain.  No shortness of breath.  Patient is complaining of malaise.  However, today she states that she is feeling better except for some persistent congestion in her sinuses.  Patient states that she has been taking Tylenol  at home in addition to using saline nasal rinses, Nasacort  and cetirizine .  Review of Systems  Constitutional:  Positive for malaise/fatigue. Negative for chills and fever.  HENT:  Positive for congestion and sinus pain. Negative for ear discharge, ear pain and nosebleeds.        She does complain of some stuffiness in her ears  Respiratory:  Positive for cough and sputum production. Negative for hemoptysis and shortness of breath.   Neurological:  Positive for headaches.        Objective:    BP 114/68   Pulse 75   Temp 98.1 F (36.7 C)   Ht 5' (1.524 m)   Wt 123 lb (55.8 kg)   LMP 11/28/1998   SpO2 98%   BMI 24.02 kg/m    Physical Exam Constitutional:      Appearance: Normal appearance.  HENT:     Head: Normocephalic and atraumatic.     Right Ear: Tympanic membrane, ear canal and external ear normal.     Left Ear: Tympanic membrane, ear canal and external ear normal.     Nose: Congestion and rhinorrhea present.     Right Turbinates: Swollen.     Left Turbinates: Swollen.     Right Sinus: No maxillary sinus tenderness or frontal sinus  tenderness.     Left Sinus: No maxillary sinus tenderness or frontal sinus tenderness.     Mouth/Throat:     Mouth: Mucous membranes are moist.     Pharynx: Oropharynx is clear. No oropharyngeal exudate or posterior oropharyngeal erythema.  Cardiovascular:     Rate and Rhythm: Normal rate and regular rhythm.     Heart sounds: Normal heart sounds.  Pulmonary:     Effort: Pulmonary effort is normal.     Breath sounds: Normal breath sounds. No wheezing, rhonchi or rales.  Musculoskeletal:     Cervical back: Neck supple.  Lymphadenopathy:     Cervical: No cervical adenopathy.  Neurological:     Mental Status: She is alert.     No results found for any visits on 01/26/24.      Assessment & Plan:   Problem List Items Addressed This Visit       Respiratory   Viral URI - Primary   -Patient presented today for nasal/sinus congestion for the last 3 days.  She also complains of some pressure over her ear.  Patient states that she did have some mild chills and a low-grade fever on the first day as well as associated malaise.  She states that her  malaise is improved but she still has persistent congestion. -On exam today, she had no frontal or maxillary sinus tenderness.  She did have some enlarged nasal turbinates with erythema.  No pharyngeal erythema or exudate noted.  Lungs were clear to auscultation bilaterally.  Ear exam was within normal limits. -Patient states that her symptoms are already improving.  I suspect this is likely secondary to a viral URI. -No further workup required at this time -Will continue with conservative measures including saline nasal rinses, Nasacort  and cetirizine  as well as Nettie pot for her congestion and warm showers with steam inhalation.       No orders of the defined types were placed in this encounter.   No follow-ups on file.  Lemuel Boodram, MD

## 2024-02-14 DIAGNOSIS — M810 Age-related osteoporosis without current pathological fracture: Secondary | ICD-10-CM | POA: Diagnosis not present

## 2024-02-14 DIAGNOSIS — Z853 Personal history of malignant neoplasm of breast: Secondary | ICD-10-CM | POA: Diagnosis not present

## 2024-02-14 DIAGNOSIS — Z79811 Long term (current) use of aromatase inhibitors: Secondary | ICD-10-CM | POA: Diagnosis not present

## 2024-02-16 ENCOUNTER — Inpatient Hospital Stay: Payer: Medicare Other | Attending: Internal Medicine | Admitting: Occupational Therapy

## 2024-02-16 DIAGNOSIS — L905 Scar conditions and fibrosis of skin: Secondary | ICD-10-CM

## 2024-02-16 NOTE — Therapy (Signed)
 [facility] - A Dept Of [place]. [facility] [address], [address] [place], [place], [address] Phone: [phone]   Fax:  [phone]  Occupational Therapy Screen  Patient Details  Name: Jamie Elliott MRN: [id number] Date of Birth: 02-Feb-1946 No data recorded  Encounter Date: 02/16/2024   OT End of Session - 02/16/24 0910     Visit Number 0             Past Medical History:  Diagnosis Date   Environmental allergies    Hypercholesterolemia    Hypertension    Migraines     Past Surgical History:  Procedure Laterality Date   ABDOMINAL HYSTERECTOMY  12/21/1998   APPENDECTOMY  12/21/1998   BREAST SURGERY  05/2023   at [facility] lumpectomy   CATARACT EXTRACTION W/PHACO Left 04/23/2021   Procedure: CATARACT EXTRACTION PHACO AND INTRAOCULAR LENS PLACEMENT (IOC) LEFT panoptix toric 10.41 01:22.8 12.6%;  Surgeon: Lockie Mola, MD;  Location: [facility];  Service: Ophthalmology;  Laterality: Left;   CATARACT EXTRACTION W/PHACO Right 05/07/2021   Procedure: CATARACT EXTRACTION PHACO AND INTRAOCULAR LENS PLACEMENT (IOC) RIGHT  panoptix 12.85 01:33.8 13.7%;  Surgeon: Lockie Mola, MD;  Location: [facility];  Service: Ophthalmology;  Laterality: Right;   NOSE SURGERY  12/21/1965   SEPTOPLASTY     SHOULDER ARTHROSCOPY WITH OPEN ROTATOR CUFF REPAIR Right 12/05/2015   Procedure: SHOULDER ARTHROSCOPY WITH MINI OPEN ROTATOR CUFF REPAIR. DISTAL CLAVICLE EXCISION;  Surgeon: Juanell Fairly, MD;  Location: [facility];  Service: Orthopedics;  Laterality: Right;    There were no vitals filed for this visit.        LYMPHEDEMA/ONCOLOGY QUESTIONNAIRE - 02/16/24 0001       Right Upper Extremity Lymphedema   15 cm Proximal to Olecranon Process 25.8 cm    10 cm Proximal to Olecranon Process 23 cm    Olecranon Process 20.5 cm      Left Upper Extremity Lymphedema   15 cm Proximal to Olecranon Process 25.3 cm    10  cm Proximal to Olecranon Process 23.2 cm    Olecranon Process 21 cm              12/05/24 [facility] appt: INTERVAL HISTORY: Jamie Elliott returns today for routine management of her left breast cancer. She was most recent seen in RadOnc clinic in July 2024 while receiving radiation therapy. She has recovered nicely from her acute radiation related toxicities and is feeling overall well today. She did develop left breast abscess/cellulitis in late August which required antibiotics.  09/01/23: Mammo/US limited left: IMPRESSION: Ill-defined phlegmonous changes in the left breast at 2:00, 8 cm from nipple, corresponding with site of skin redness/palpable abnormality. This extends to the skin surface, consistent with patient reported history of recent yellow colored, cloudy/thick discharge. There is no discrete, organized fluid collection or drainable abscess. RECOMMENDATION: Clinical Follow-up is recommended for the left breast.  Today she reports significant improvement. Mild tenderness noted and mild erythema noted at this time. She does continue to apply warm compresses if needed which she finds to be very helpful.  She has also noticed some mild tenderness in her left axilla and a little pulling sensation when lifting her arm above her head. This does not create any specific limitations for her.   Continues followup with medical oncology team and surgical oncology team without any evidence of recurrent disease.  In the interim she started on Arimidex and is tolerating  it well overall without any reported side effects today.       OT SCREEN 01/19/24: Patient return after being seen earlier last year for preop lumpectomy assessment and education for home program. Patient returned this date after finishing up all her treatment.  Patient had lumpectomy followed by radiation.  But had another surgery in the same incision. Patient ended up with cellulitis that resulted in a few rounds of  antibiotic. Patient referred for increased discomfort on left lateral breast around the scar in combination with range of motion overhead resulting in a slight pull. Patient scar tender and fibrotic.  Patient was educated on scar massage to be done 2 times a day for 2 to 3 minutes.  As well as OT done mini massager with great results with decrease scar tissue as well as decreased pain. Patient to follow-up if needed in 3 to 4 weeks to reassess if need chip bag. Patient ask OT about exercises for her post cancer treatment as well as with her risk for osteoporosis. Discussed with patient possible referral to [employer] exercise program that starting in March.   OT SCREEN 02/16/24: Patient return for follow-up after seeing 4 weeks ago for scar tissue and fibrosis on left breast.  Patient done soft tissue massage 2 times a day. With great success and improvement in fibrosis on L breast.  Patient only with 1 area of 2 x 2 cm at 2:00. Reviewed with patient again soft tissue massage.  Patient to do only the next few weeks twice a day for 1 minute. Reinforced again with patient not to slide and cause friction but keep in place doing scar massage with no friction.  Patient to follow-up with me as needed. Circumference taken for bilateral upper extremity and compared to the right.  Patient is left-hand dominant.  Circumference within normal limits -no signs and symptoms of lymphedema. Patient is starting in March the real and heel exercise program.                                        Visit Diagnosis: Scar condition and fibrosis of skin    Problem List Patient Active Problem List   Diagnosis Date Noted   Rash 12/12/2023   Cellulitis of breast 10/04/2023   Genetic testing 05/24/2023   Carcinoma of upper-outer quadrant of left breast in female, estrogen receptor positive (HCC) 03/22/2023   Viral URI 03/15/2023   Headache 01/14/2023   Fall 08/23/2022   Glossitis  05/02/2022   Aphthous ulcer 04/11/2022   Leukocytosis 04/07/2022   Viral syndrome 03/28/2022   Respiratory tract infection 03/17/2022   Nausea and vomiting 03/17/2022   Vitamin D deficiency 12/11/2021   Mouth sores 10/19/2021   Exposure to COVID-19 virus 08/24/2021   Abnormal mammogram 06/26/2021   History of COVID-19 06/18/2021   Bleeding 03/15/2021   Left foot pain 03/04/2021   Fatigue 01/05/2021   Angioleiomyoma 11/18/2020   Nasal drainage 08/12/2020   Stress 04/22/2020   Sinus congestion 11/08/2019   Fever 09/17/2019   Allergy 07/02/2019   Low back pain 12/11/2018   Left arm pain 07/24/2018   Right hip pain 05/24/2018   Left leg swelling 09/03/2017   Chest pressure 06/16/2017   Altered sensation of foot 06/16/2017   Recurrent UTI 05/12/2017   Constipation 02/16/2016   UTI (urinary tract infection) 01/06/2016   Pain in joint, shoulder region 12/05/2015  Loss of weight 11/25/2015   History of abnormal mammogram 08/26/2015   Health care maintenance 05/19/2015   Osteoporosis 04/17/2014   Abnormal liver function tests 12/17/2013   HTN (hypertension) 12/04/2012   Hypercholesterolemia 12/04/2012   Environmental allergies 12/04/2012    Oletta Cohn, OTR/L,CLT 02/16/2024, 9:11 AM  [facility] - [facility] Dept Of [place]. [facility] [address] [place], [place], [address] Phone: [phone]   Fax:  [phone]  Name: Jamie Elliott MRN: [id number] Date of Birth: 03/10/1946

## 2024-03-01 ENCOUNTER — Encounter: Payer: Self-pay | Admitting: Internal Medicine

## 2024-03-01 ENCOUNTER — Ambulatory Visit: Payer: Self-pay | Admitting: Internal Medicine

## 2024-03-01 ENCOUNTER — Ambulatory Visit: Admitting: Internal Medicine

## 2024-03-01 VITALS — BP 122/78 | HR 72 | Temp 98.2°F | Ht 60.0 in | Wt 124.4 lb

## 2024-03-01 DIAGNOSIS — S53402A Unspecified sprain of left elbow, initial encounter: Secondary | ICD-10-CM | POA: Diagnosis not present

## 2024-03-01 MED ORDER — IBUPROFEN 600 MG PO TABS
600.0000 mg | ORAL_TABLET | Freq: Three times a day (TID) | ORAL | 0 refills | Status: AC
Start: 2024-03-01 — End: ?

## 2024-03-01 NOTE — Assessment & Plan Note (Signed)
-  Patient presented today with pain over her left bicep with internal rotation which occurred after attempting to lift a heavy teapot -Patient states that the pain is exacerbated when she does other housework like mopping -On exam, patient was noted to have tenderness over her left bicep especially with internal rotation -I suspect she likely has a sprain of her left bicep -Patient was advised to rest that muscle and avoid internal rotation of her left arm as well as ice the arm -NSAIDs were given for pain control and for anti-inflammatory effect -No further workup at this time -Return precautions given to the patient

## 2024-03-01 NOTE — Telephone Encounter (Signed)
 Copied from CRM [id number]. Topic: Clinical - Red Word Triage >> Mar 01, 2024  9:00 AM Adele Barthel wrote: [facility] that prompted transfer to Nurse Triage:    Shooting/Tingling sensation in both hands and left upper arm, intermittently Constant pain in left upper arm with more movement.  Has history of breast cancer on left side Injured arm a week ago lifting a heavy object.  Pain is 6/10  Chief Complaint: numbness, tingling to both hands, left upper arm pain Symptoms: see above Frequency: numbness and tingling to hands comes and goes; left arm pain is consistant Pertinent Negatives: Patient denies cp, sob, weakness Disposition: [x] ED /[] Urgent Care (no appt availability in office) / [] Appointment(In office/virtual)/ []  [facility]/ [] Home Care/ [] Refused Recommended Disposition /[] [facility]/ []  Follow-up with PCP Additional Notes: Has history of breast cancer on left side Injured arm a week ago lifting a heavy object.  Pain is 6/10 Per protocol instructed to go to the ER; care advice given, denies questions; Pcp office updated.   Reason for Disposition  [1] Numbness (i.e., loss of sensation) of the face, arm / hand, or leg / foot on one side of the body AND [2] sudden onset AND [3] present now  Answer Assessment - Initial Assessment Questions 1. SYMPTOM: "What is the main symptom you are concerned about?" (e.g., weakness, numbness)     Numbness and tingling sensation in both hands and left upper arm intermittently 2. ONSET: "When did this start?" (minutes, hours, days; while sleeping)     Several days ago 3. LAST NORMAL: "When was the last time you (the patient) were normal (no symptoms)?"     Last week sometime 4. PATTERN "Does this come and go, or has it been constant since it started?"  "Is it present now?"     Intermittent but pain in upper left arm pain is consistent, lifted a hot tea pot.  5. CARDIAC SYMPTOMS: "Have you had any of the following symptoms:  chest pain, difficulty breathing, palpitations?"     Denies  6. NEUROLOGIC SYMPTOMS: "Have you had any of the following symptoms: headache, dizziness, vision loss, double vision, changes in speech, unsteady on your feet?"     denies 7. OTHER SYMPTOMS: "Do you have any other symptoms?"     denies 8. PREGNANCY: "Is there any chance you are pregnant?" "When was your last menstrual period?"     na  Protocols used: Neurologic Deficit-A-AH

## 2024-03-01 NOTE — Telephone Encounter (Signed)
 Called patient to confirm that has no chest pressure, no chest pain,  no sob, no light headedness. Pt reports that she was lifting something heavy about a week ago and has been having issues with her arm since then. She did not feel that she needed to go to ED but wanted to have her arm checked to confirm she did not injure it. Pt has been scheduled with Dr Heide Spark today at 1 pm.

## 2024-03-01 NOTE — Patient Instructions (Addendum)
-  It was a pleasure meeting you again -I do believe that you have a sprain in your biceps muscle from lifting the heavy teapot -I would advise rest to the muscle (avoid rotating her arm internally), ice and rest -No indication for x-rays at this time -Please call us if your symptoms persist and are not improving or if your symptoms worsen acutely -Please contact us if you have any questions or concerns

## 2024-03-01 NOTE — Progress Notes (Signed)
   Acute Office Visit  Subjective:     Patient ID: Jamie Elliott, female    DOB: 04-May-1946, 78 y.o.   MRN: [id number]  Chief Complaint  Patient presents with   Acute Visit    Left arm pain after lifting something heavy    HPI Patient is in today for left arm sprain.  Patient presented today with pain over her biceps muscle after attempting to lift a heavy teapot 4 days ago.  Patient states that she has remained active since then and mop and cleaned using that arm and and this seems to aggravate her pain.  She denies any shortness of breath or nausea/vomiting or chest pain.  No fevers or chills.  Patient also complains of some intermittent tingling and numbness in both hands.  Review of Systems  Constitutional: Negative.   Respiratory: Negative.    Cardiovascular: Negative.   Gastrointestinal:  Negative for nausea and vomiting.  Musculoskeletal:  Positive for myalgias. Negative for falls, joint pain and neck pain.  Neurological:  Positive for tingling.  Psychiatric/Behavioral: Negative.          Objective:    BP 122/78   Pulse 72   Temp 98.2 F (36.8 C)   Ht 5' (1.524 m)   Wt 124 lb 6.4 oz (56.4 kg)   LMP 11/28/1998   SpO2 98%   BMI 24.30 kg/m    Physical Exam Constitutional:      Appearance: Normal appearance.  HENT:     Head: Normocephalic and atraumatic.  Cardiovascular:     Rate and Rhythm: Normal rate.     Heart sounds: Normal heart sounds.  Pulmonary:     Breath sounds: Normal breath sounds. No wheezing or rales.  Musculoskeletal:        General: Tenderness present. No deformity or signs of injury.       Arms:     Comments: Strength is 5 out of 5 in hands as well as with flexion and extension of both arms.  Patient noted to have significant pain with internal rotation of her left arm and tenderness over her left bicep.  Neurological:     Mental Status: She is alert and oriented to person, place, and time.  Psychiatric:        Behavior: Behavior normal.      No results found for any visits on 03/01/24.      Assessment & Plan:   Problem List Items Addressed This Visit   None   No orders of the defined types were placed in this encounter.   No follow-ups on file.  Earl Lagos, MD

## 2024-03-03 ENCOUNTER — Other Ambulatory Visit: Payer: Self-pay | Admitting: Internal Medicine

## 2024-03-08 IMAGING — CT CT HEAD W/O CM
4 series · 16 of 47 positions shown, 18 images · non-contrast
Comparison: None.

CLINICAL DATA: Mental status change, unknown cause Patient
complains of intractable nausea and vomiting with some sinus pain
and drainage that and pain in her tailbone are the only complaints.



[Series 2: head wo · axial · 0.40mm/px · z∈[+523,+618]mm · 7 of 27 slices shown, 9 images]
[im 4/27  brain]
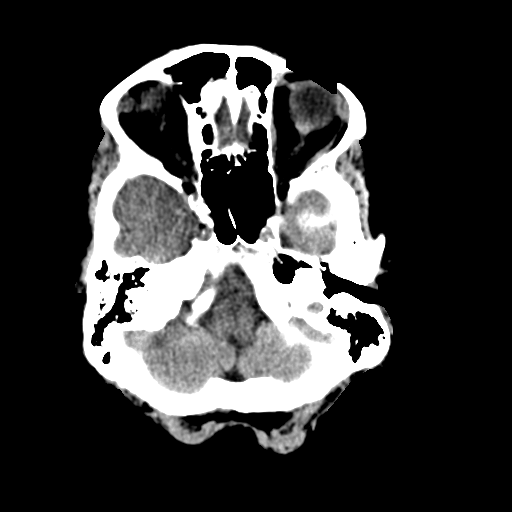
[im 4/27  bone]
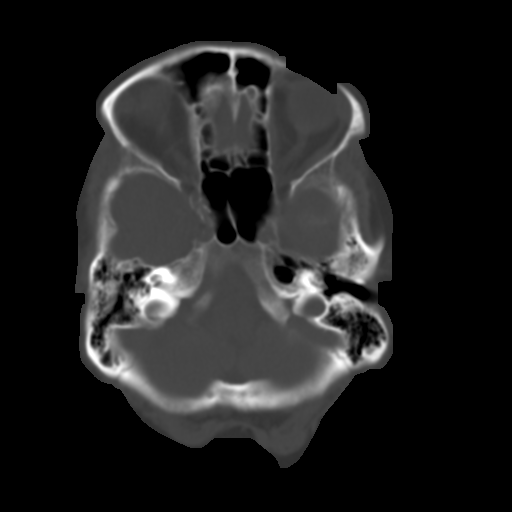
[im 7/27  brain]
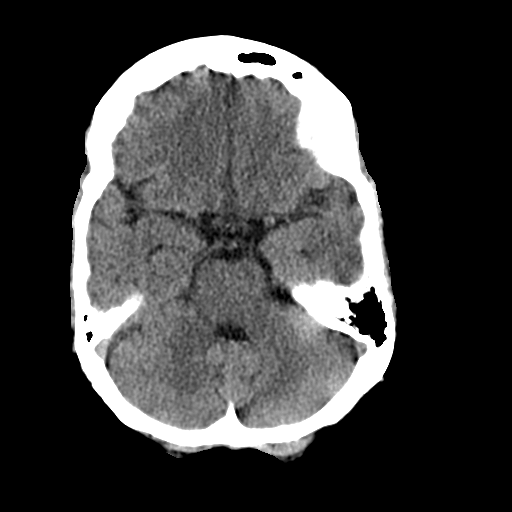
[im 10/27  brain]
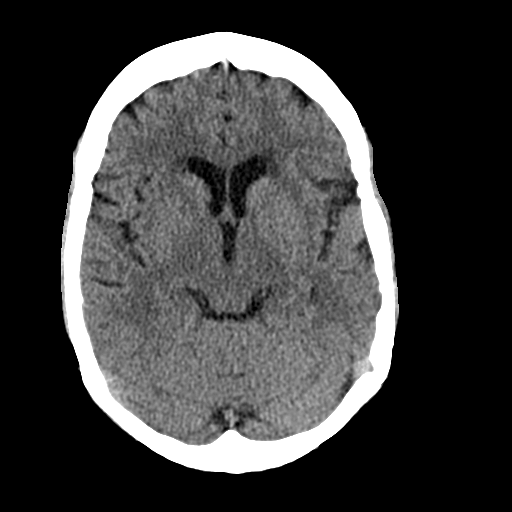
[im 14/27  brain]
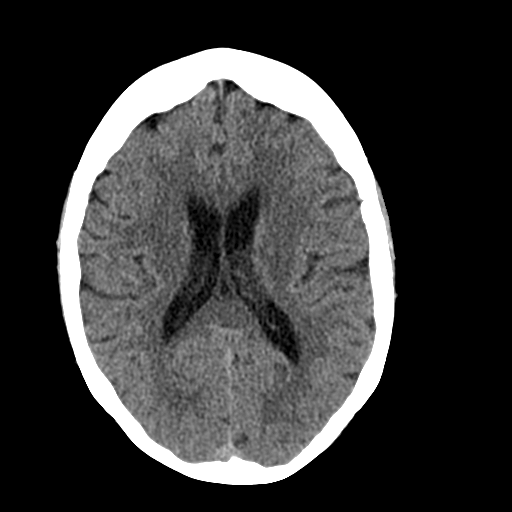
[im 17/27  brain]
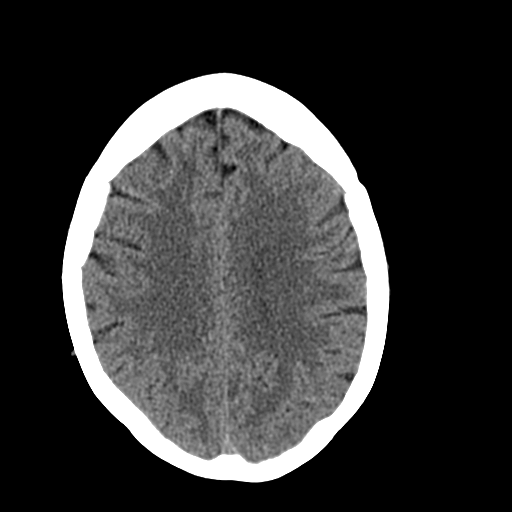
[im 17/27  bone]
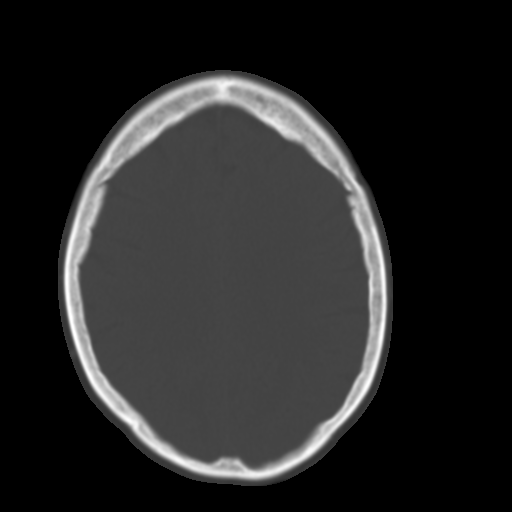
[im 20/27  brain]
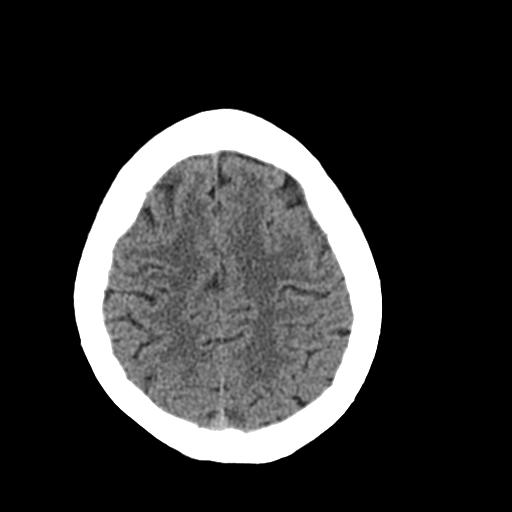
[im 23/27  brain]
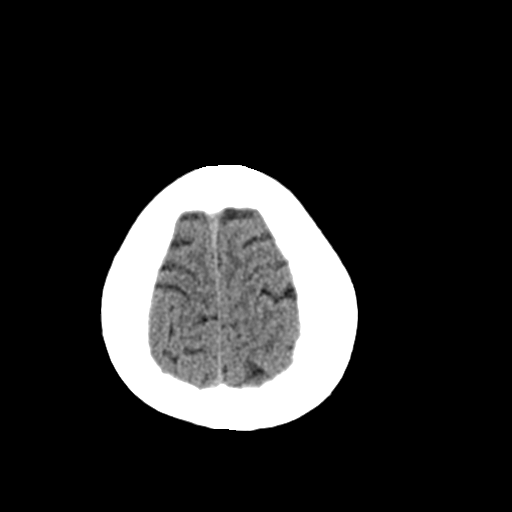

[Series 3: head bone · axial · 0.40mm/px · z∈[+520,+548]mm · 3 of 68 slices shown]
[im 7/68  bone]
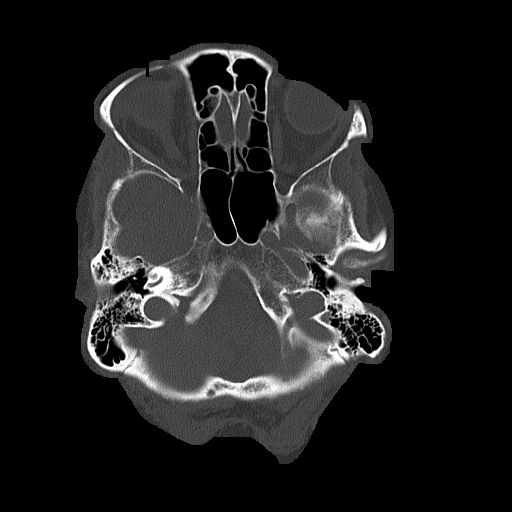
[im 14/68  bone]
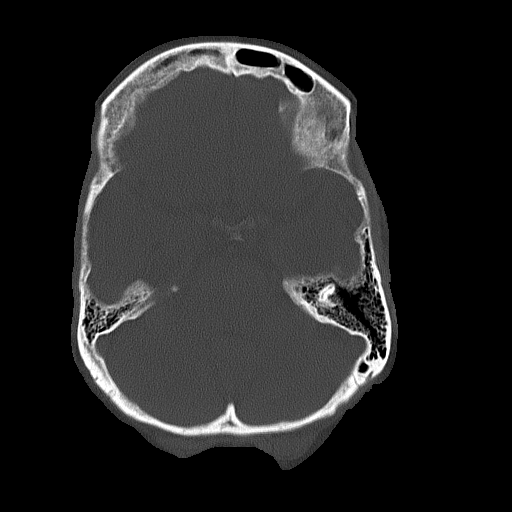
[im 21/68  bone]
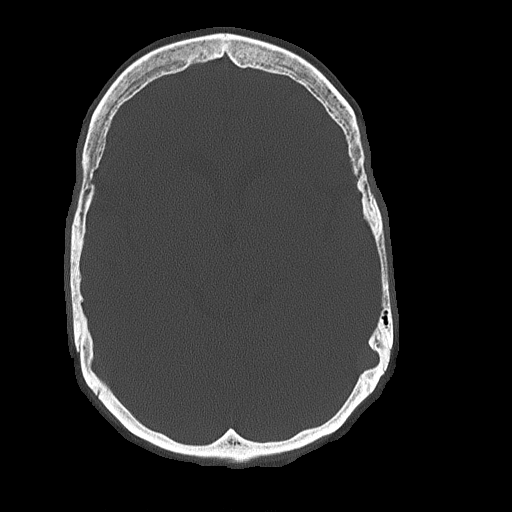

[Series 4: coronal soft tissue · coronal · 0.28mm/px · 3 of 61 slices shown]
[im 21/61  brain]
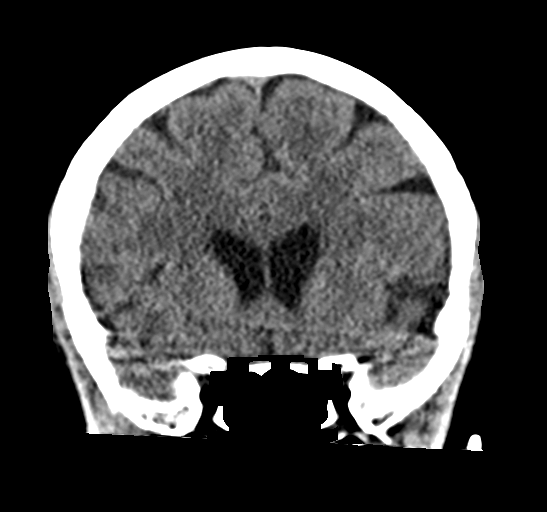
[im 27/61  brain]
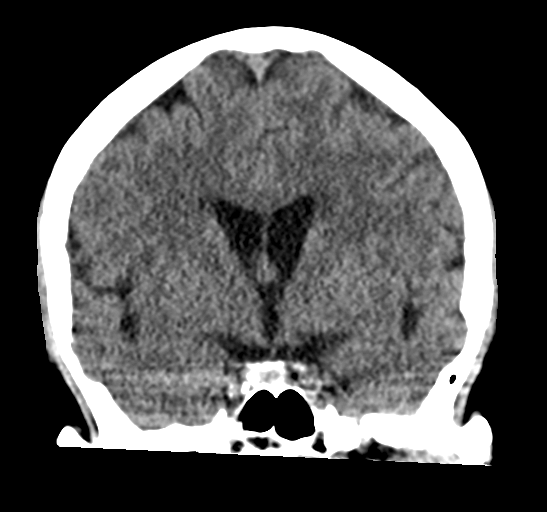
[im 34/61  brain]
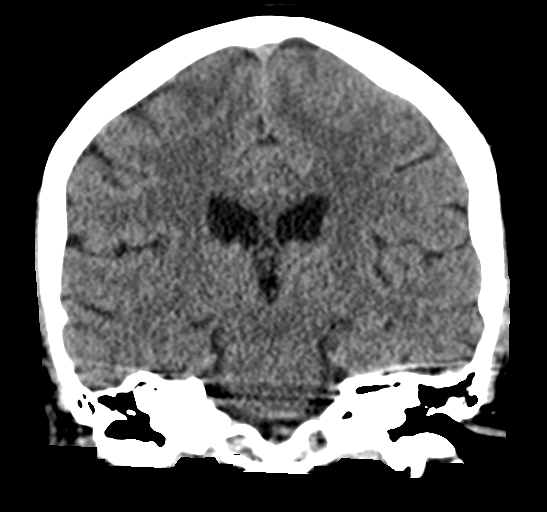

[Series 5: sagittal soft tissue · sagittal · 0.28mm/px · 3 of 47 slices shown]
[im 16/47  brain]
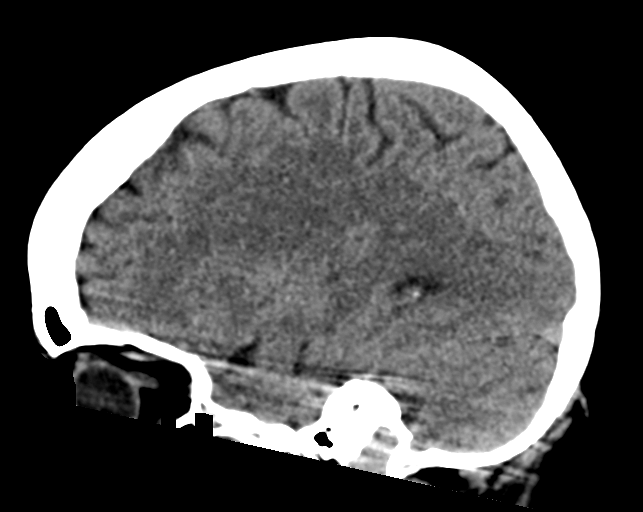
[im 24/47  brain]
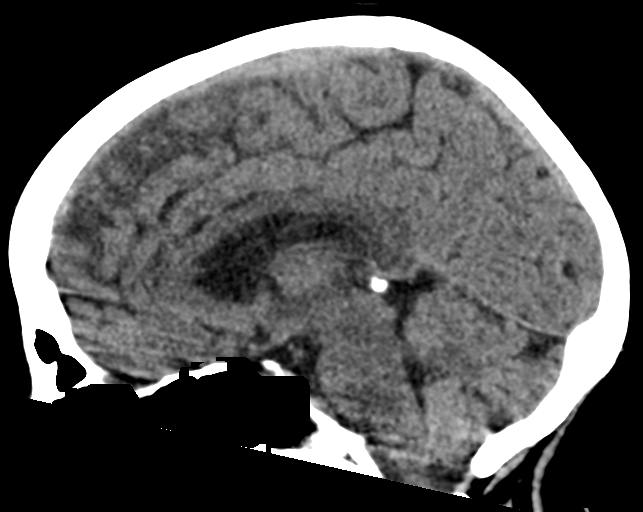
[im 31/47  brain]
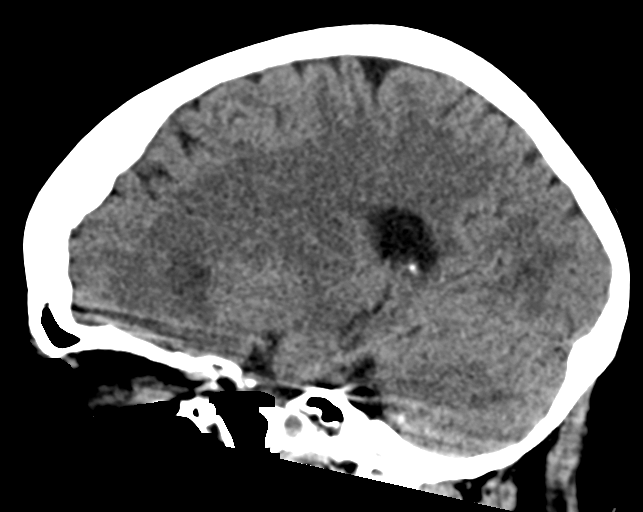

[16 of 47 positions shown; findings below may reference images not displayed]

FINDINGS: Brain: No evidence of acute intracranial hemorrhage or extra-axial
collection.No evidence of mass lesion/concerning mass effect.The
ventricles are normal in size.Scattered subcortical and
periventricular white matter hypodensities, nonspecific but likely
sequela of chronic small vessel ischemic disease.

Vascular: No hyperdense vessel or unexpected calcification.

Skull: Normal. Negative for fracture or focal lesion.

Sinuses/Orbits: No acute finding.

Other: None.
IMPRESSION: No acute intracranial abnormality.

## 2024-03-09 IMAGING — CT CT ANGIO CHEST
2 of 7 series · 18 of 46 positions shown · IV contrast (APPLIED)
Comparison: None.

CLINICAL DATA: PE suspected, positive D-dimer

EXAM:
CT ANGIOGRAPHY CHEST WITH CONTRAST
TECHNIQUE: Multidetector CT imaging of the chest was performed using the
standard protocol during bolus administration of intravenous
contrast. Multiplanar CT image reconstructions and MIPs were
obtained to evaluate the vascular anatomy.

[Series 5: thins · axial · 0.57mm/px · z∈[-626,-397]mm · 15 of 319 slices shown]
[im 16/319  lung]
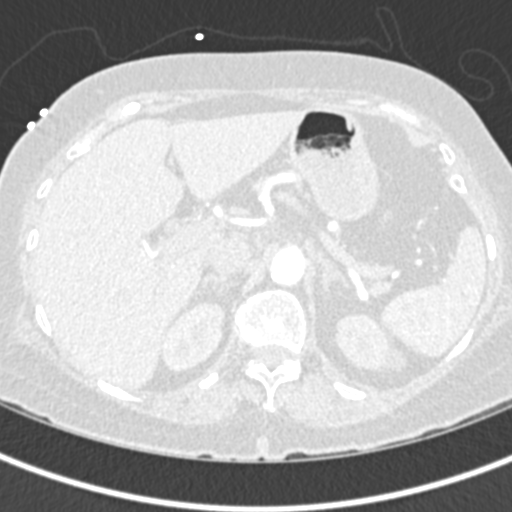
[im 32/319  soft-tissue]
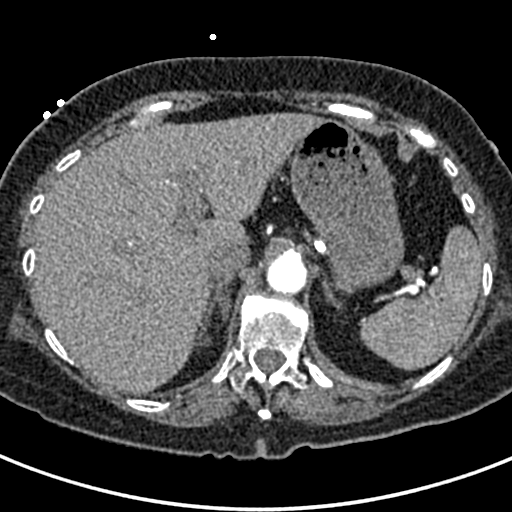
[im 64/319  lung]
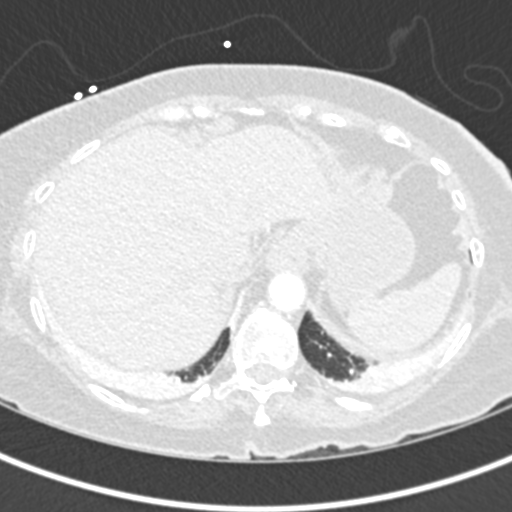
[im 80/319  soft-tissue]
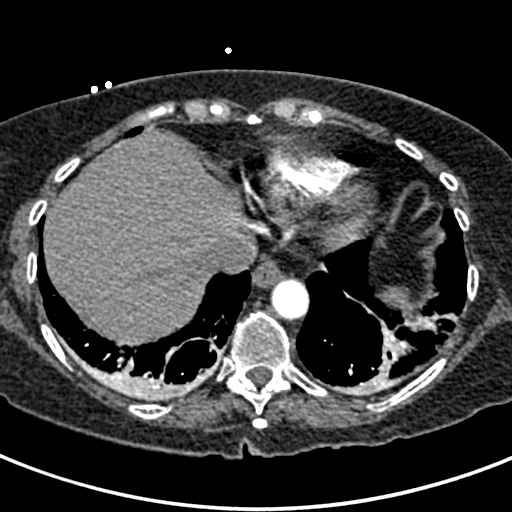
[im 96/319  lung]
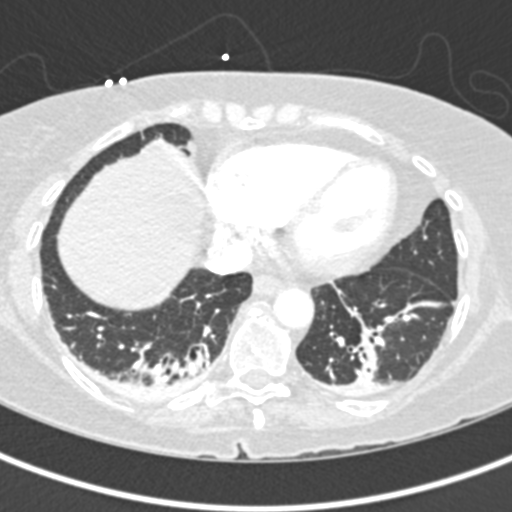
[im 112/319  soft-tissue]
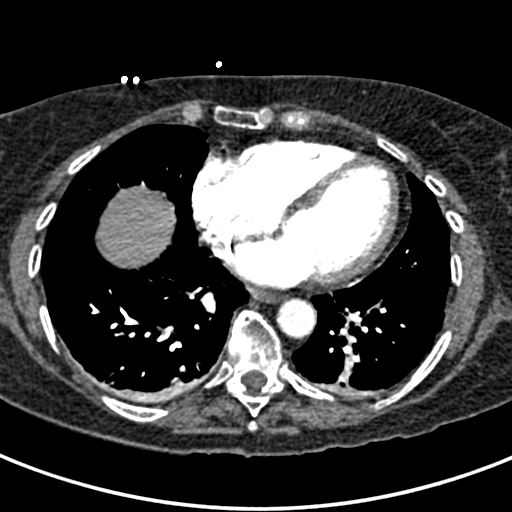
[im 144/319  lung]
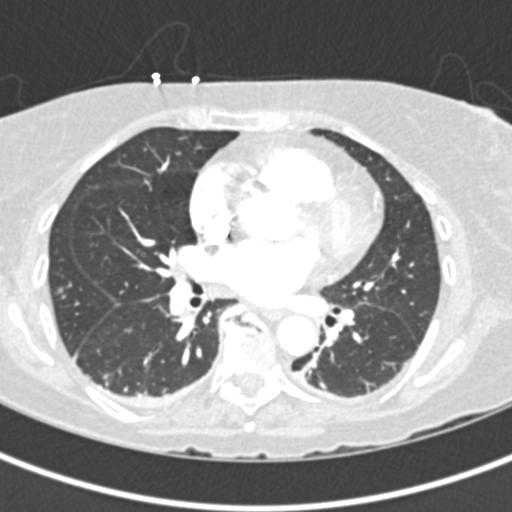
[im 160/319  soft-tissue]
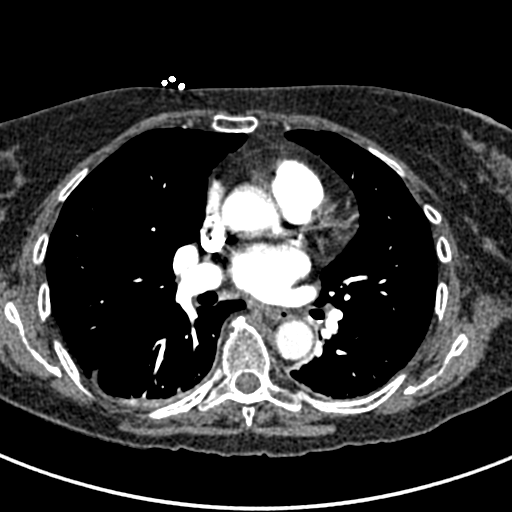
[im 175/319  lung]
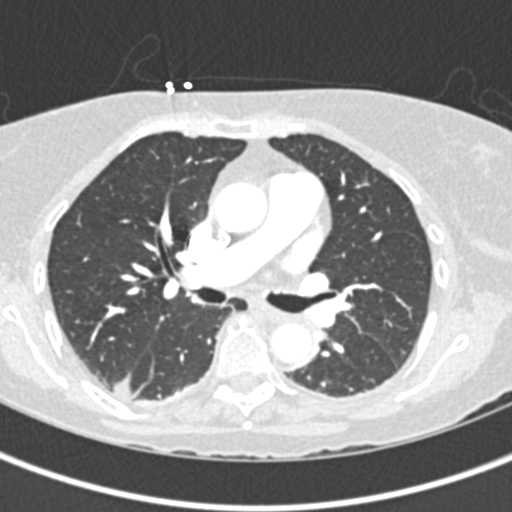
[im 207/319  soft-tissue]
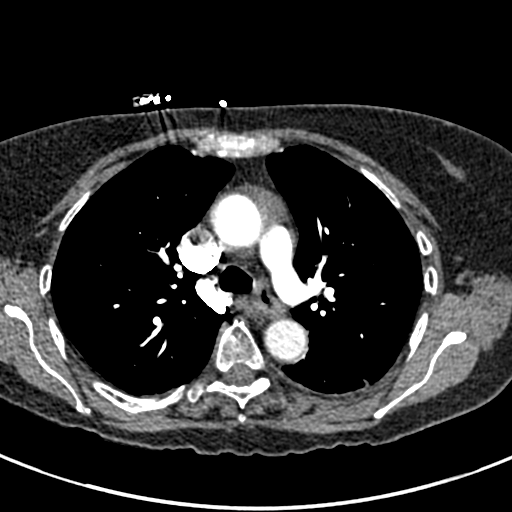
[im 223/319  lung]
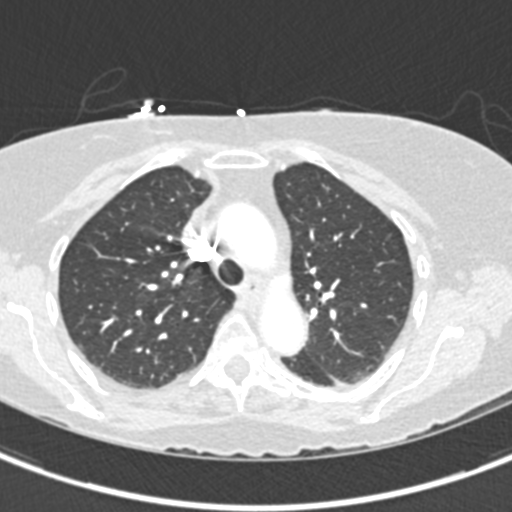
[im 239/319  soft-tissue]
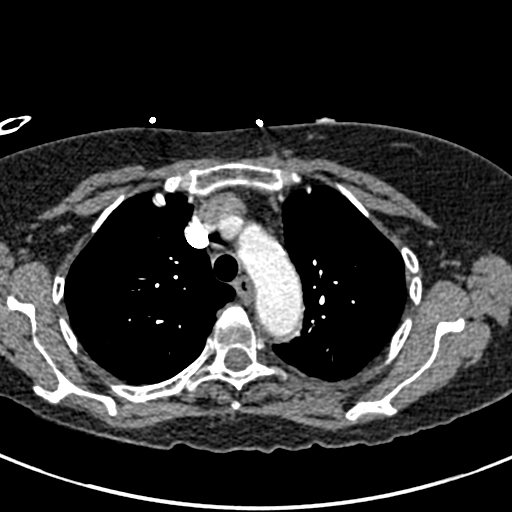
[im 255/319  lung]
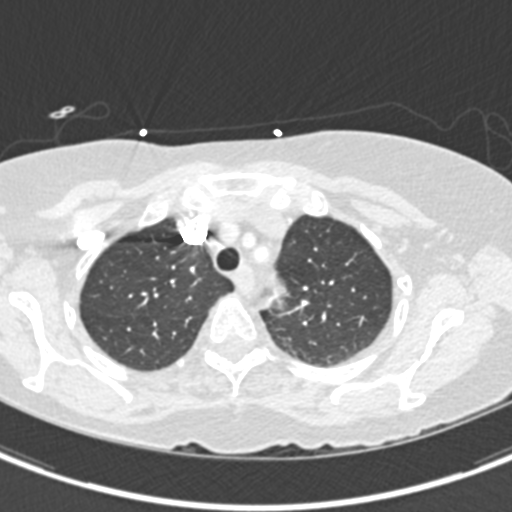
[im 287/319  soft-tissue]
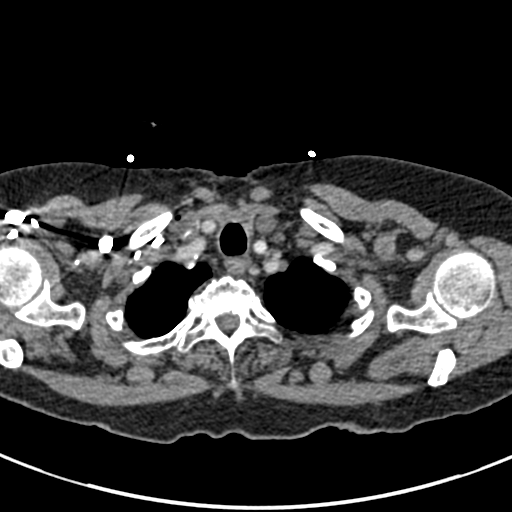
[im 303/319  lung]
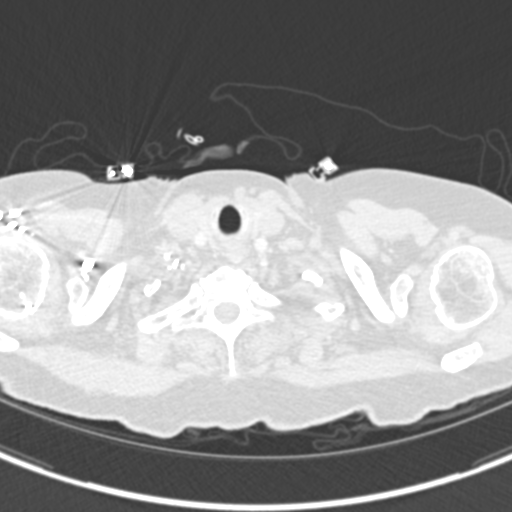

[Series 7: coronal mpr · coronal · 0.50mm/px · 3 of 70 slices shown]
[im 18/70  soft-tissue]
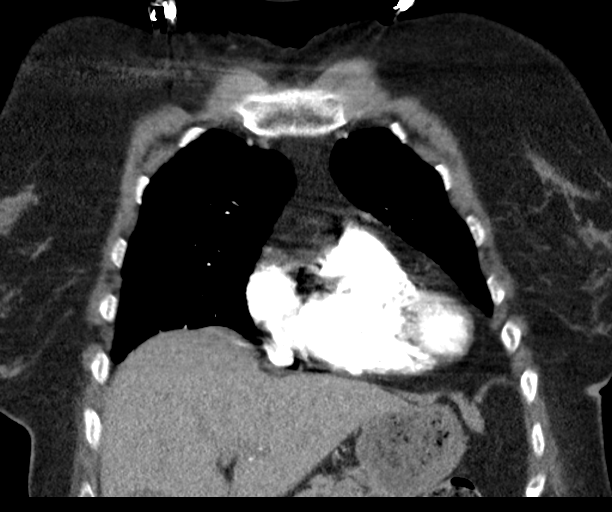
[im 35/70  soft-tissue]
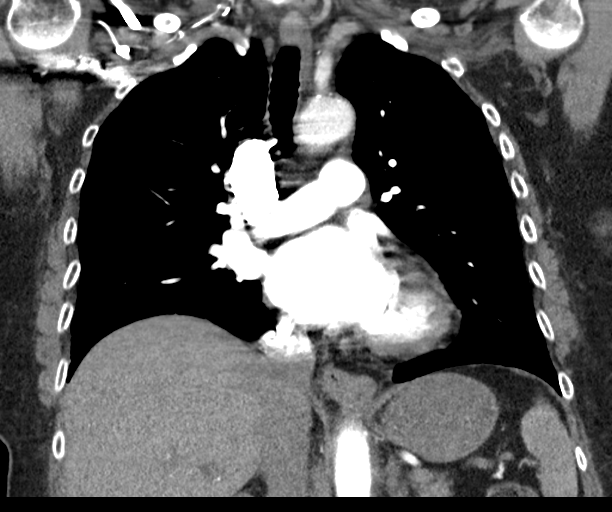
[im 52/70  soft-tissue]
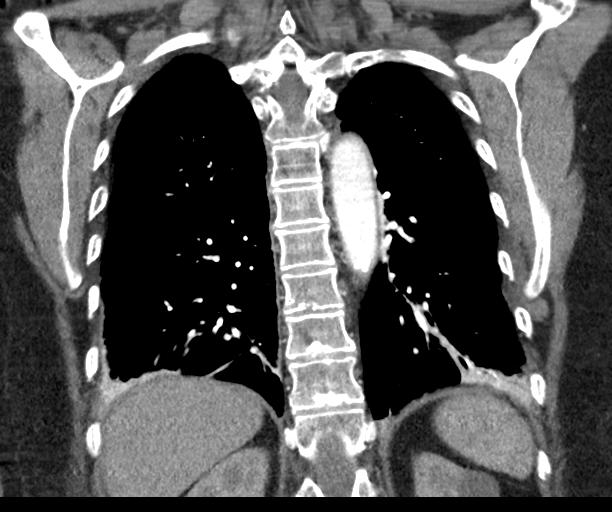

[18 of 46 positions shown; findings below may reference images not displayed]

RADIATION DOSE REDUCTION: This exam was performed according to the
departmental dose-optimization program which includes automated
exposure control, adjustment of the mA and/or kV according to
patient size and/or use of iterative reconstruction technique.

CONTRAST:  75mL OMNIPAQUE IOHEXOL 350 MG/ML SOLN
FINDINGS: Cardiovascular: Satisfactory opacification of the pulmonary arteries
to the segmental level. No evidence of pulmonary embolism. Normal
heart size. Scattered left coronary artery calcifications. No
pericardial effusion. Aortic atherosclerosis.

Mediastinum/Nodes: No enlarged mediastinal, hilar, or axillary lymph
nodes. Small hiatal hernia. Thyroid gland, trachea, and esophagus
demonstrate no significant findings.

Lungs/Pleura: Bandlike scarring and or atelectasis of the bilateral
lung bases. No pleural effusion or pneumothorax.

Upper Abdomen: No acute abnormality.

Musculoskeletal: No chest wall abnormality. No acute osseous
findings.

Review of the MIP images confirms the above findings.
IMPRESSION: 1. Negative examination for pulmonary embolism.
2. Bandlike scarring and or atelectasis of the bilateral lung bases.
No acute appearing airspace opacity
3. Coronary artery disease.

Aortic Atherosclerosis ([id number]).

## 2024-03-23 ENCOUNTER — Other Ambulatory Visit: Payer: Medicare Other

## 2024-03-24 ENCOUNTER — Other Ambulatory Visit (INDEPENDENT_AMBULATORY_CARE_PROVIDER_SITE_OTHER)

## 2024-03-24 DIAGNOSIS — E7800 Pure hypercholesterolemia, unspecified: Secondary | ICD-10-CM | POA: Diagnosis not present

## 2024-03-24 LAB — BASIC METABOLIC PANEL WITH GFR
BUN: 19 mg/dL (ref 6–23)
CO2: 29 meq/L (ref 19–32)
Calcium: 10.1 mg/dL (ref 8.4–10.5)
Chloride: 105 meq/L (ref 96–112)
Creatinine, Ser: 0.76 mg/dL (ref 0.40–1.20)
GFR: 75.19 mL/min (ref >60.00)
Glucose, Bld: 89 mg/dL (ref 70–99)
Potassium: 3.7 meq/L (ref 3.5–5.1)
Sodium: 141 meq/L (ref 135–145)

## 2024-03-24 LAB — HEPATIC FUNCTION PANEL
ALT: 21 U/L (ref 0–35)
AST: 18 U/L (ref 0–37)
Albumin: 4.2 g/dL (ref 3.5–5.2)
Alkaline Phosphatase: 72 U/L (ref 39–117)
Bilirubin, Direct: 0.1 mg/dL (ref 0.0–0.3)
Total Bilirubin: 0.6 mg/dL (ref 0.2–1.2)
Total Protein: 6.4 g/dL (ref 6.0–8.3)

## 2024-03-24 LAB — LIPID PANEL
Cholesterol: 154 mg/dL (ref 0–200)
HDL: 57.4 mg/dL (ref >39.00)
LDL Cholesterol: 77 mg/dL (ref 0–99)
NonHDL: 96.53
Total CHOL/HDL Ratio: 3
Triglycerides: 96 mg/dL (ref 0.0–149.0)
VLDL: 19.2 mg/dL (ref 0.0–40.0)

## 2024-03-24 LAB — TSH: TSH: 1.5 u[IU]/mL (ref 0.35–5.50)

## 2024-03-28 ENCOUNTER — Encounter: Payer: Self-pay | Admitting: Internal Medicine

## 2024-03-28 ENCOUNTER — Ambulatory Visit (INDEPENDENT_AMBULATORY_CARE_PROVIDER_SITE_OTHER): Payer: Medicare Other | Admitting: Internal Medicine

## 2024-03-28 VITALS — BP 120/74 | HR 73 | Temp 98.2°F | Resp 16 | Ht 60.0 in | Wt 125.6 lb

## 2024-03-28 DIAGNOSIS — Z170 Estrogen receptor positive status [ER+]: Secondary | ICD-10-CM

## 2024-03-28 DIAGNOSIS — M79602 Pain in left arm: Secondary | ICD-10-CM

## 2024-03-28 DIAGNOSIS — M79675 Pain in left toe(s): Secondary | ICD-10-CM

## 2024-03-28 DIAGNOSIS — M542 Cervicalgia: Secondary | ICD-10-CM

## 2024-03-28 DIAGNOSIS — Z9109 Other allergy status, other than to drugs and biological substances: Secondary | ICD-10-CM

## 2024-03-28 DIAGNOSIS — E7800 Pure hypercholesterolemia, unspecified: Secondary | ICD-10-CM

## 2024-03-28 DIAGNOSIS — F439 Reaction to severe stress, unspecified: Secondary | ICD-10-CM

## 2024-03-28 DIAGNOSIS — C50412 Malignant neoplasm of upper-outer quadrant of left female breast: Secondary | ICD-10-CM

## 2024-03-28 DIAGNOSIS — I10 Essential (primary) hypertension: Secondary | ICD-10-CM

## 2024-03-28 DIAGNOSIS — M810 Age-related osteoporosis without current pathological fracture: Secondary | ICD-10-CM | POA: Diagnosis not present

## 2024-03-28 DIAGNOSIS — D229 Melanocytic nevi, unspecified: Secondary | ICD-10-CM

## 2024-03-28 MED ORDER — HYDROCHLOROTHIAZIDE 12.5 MG PO TABS: 12.5000 mg | ORAL_TABLET | Freq: Every day | ORAL | 3 refills | Status: AC

## 2024-03-28 MED ORDER — AMLODIPINE BESYLATE 5 MG PO TABS: 5.0000 mg | ORAL_TABLET | Freq: Every day | ORAL | 3 refills | Status: AC

## 2024-03-28 MED ORDER — ATORVASTATIN CALCIUM 40 MG PO TABS: 40.0000 mg | ORAL_TABLET | Freq: Every day | ORAL | 3 refills | Status: AC

## 2024-03-28 MED ORDER — CETIRIZINE HCL 10 MG PO TABS: 10.0000 mg | ORAL_TABLET | Freq: Every day | ORAL | 3 refills | Status: DC

## 2024-03-28 MED ORDER — LOSARTAN POTASSIUM 100 MG PO TABS: 100.0000 mg | ORAL_TABLET | Freq: Every day | ORAL | 3 refills | Status: AC

## 2024-03-28 NOTE — Progress Notes (Signed)
 Subjective:    Patient ID: Jamie Elliott, female    DOB: 28-Feb-1946, 78 y.o.   MRN: [id number]  Patient here for  Chief Complaint  Patient presents with   Medical Management of Chronic Issues    HPI Here for a scheduled follow up -  follow up regarding hypercholesterolemia and hypertension. Recently diagnosed with breast cancer. Is s/p left breast partial mastectomy 04/22/23, with f/u surgery 05/20/23. XRT. Had f/u [facility] - 10/15/23 - recommended f/u and diagnostic mammogram in 05/2024. Saw Dr Valentine Gasmen 11/29/23 - continues on anastrozole.  had intolerance to fosamax. Saw endocrinology 02/14/24 - recommended reclast. Due to receive reclast infusion today. Has noticed some increased soreness/thickening/discomfort - left second toe. Discussed. Plans to f/u with podiatry. Is having some neck pain - more localized to left neck. Left arm better. Some pulling with rotation of her head. Discussed PT. Also discussed taking extra strength tylenol. Plans to f/u with dermatology regarding a mole on her back. Breathing stable. No increased cough or congestion.    Past Medical History:  Diagnosis Date   Environmental allergies    Hypercholesterolemia    Hypertension    Migraines    Past Surgical History:  Procedure Laterality Date   ABDOMINAL HYSTERECTOMY  12/21/1998   APPENDECTOMY  12/21/1998   BREAST SURGERY  05/2023   at [facility] lumpectomy   CATARACT EXTRACTION W/PHACO Left 04/23/2021   Procedure: CATARACT EXTRACTION PHACO AND INTRAOCULAR LENS PLACEMENT (IOC) LEFT panoptix toric 10.41 01:22.8 12.6%;  Surgeon: Annell Kidney, MD;  Location: [facility];  Service: Ophthalmology;  Laterality: Left;   CATARACT EXTRACTION W/PHACO Right 05/07/2021   Procedure: CATARACT EXTRACTION PHACO AND INTRAOCULAR LENS PLACEMENT (IOC) RIGHT  panoptix 12.85 01:33.8 13.7%;  Surgeon: Annell Kidney, MD;  Location: [facility];  Service: Ophthalmology;  Laterality: Right;   NOSE SURGERY  12/21/1965    SEPTOPLASTY     SHOULDER ARTHROSCOPY WITH OPEN ROTATOR CUFF REPAIR Right 12/05/2015   Procedure: SHOULDER ARTHROSCOPY WITH MINI OPEN ROTATOR CUFF REPAIR. DISTAL CLAVICLE EXCISION;  Surgeon: Rande Bushy, MD;  Location: [facility];  Service: Orthopedics;  Laterality: Right;   Family History  Problem Relation Age of Onset   Heart disease Mother    Hyperlipidemia Mother    Hypertension Mother    Lung cancer Father        dx 8s   Hyperlipidemia Father    Hypertension Father    Prostate cancer Father        dx 53s   Breast cancer Maternal Aunt        dx 60s-70s   Breast cancer Maternal Grandmother        dx 90s   Bladder Cancer Neg Hx    Kidney cancer Neg Hx    Social History   Socioeconomic History   Marital status: Married    Spouse name: Not on file   Number of children: 2   Years of education: Not on file   Highest education level: Bachelor's degree (e.g., BA, AB, BS)  Occupational History   Not on file  Tobacco Use   Smoking status: Never   Smokeless tobacco: Never  Vaping Use   Vaping status: Never Used  Substance and Sexual Activity   Alcohol use: No    Alcohol/week: 0.0 standard drinks of alcohol   Drug use: No   Sexual activity: Not Currently  Other Topics Concern   Not on file  Social History Narrative   married   Social Drivers of Health  Financial Resource Strain: Low Risk  (02/13/2024)   Received from [facility]   Overall Financial Resource Strain (CARDIA)    Difficulty of Paying Living Expenses: Not hard at all  Food Insecurity: No Food Insecurity (02/13/2024)   Received from [facility]   Hunger Vital Sign    Worried About Running Out of Food in the Last Year: Never true    Ran Out of Food in the Last Year: Never true  Transportation Needs: No Transportation Needs (02/13/2024)   Received from [facility] - Transportation    In the past 12 months, has lack of transportation kept  you from medical appointments or from getting medications?: No    Lack of Transportation (Non-Medical): No  Physical Activity: Sufficiently Active (01/10/2024)   Exercise Vital Sign    Days of Exercise per Week: 5 days    Minutes of Exercise per Session: 40 min  Stress: No Stress Concern Present (01/10/2024)   [employer] of Occupational Health - Occupational Stress Questionnaire    Feeling of Stress : Not at all  Social Connections: Socially Integrated (01/10/2024)   Social Connection and Isolation Panel [NHANES]    Frequency of Communication with Friends and Family: More than three times a week    Frequency of Social Gatherings with Friends and Family: Twice a week    Attends Religious Services: More than 4 times per year    Active Member of [employer] or Organizations: Yes    Attends Engineer, structural: More than 4 times per year    Marital Status: Married     Review of Systems  Constitutional:  Negative for appetite change and unexpected weight change.  HENT:  Negative for congestion and sinus pressure.   Respiratory:  Negative for cough, chest tightness and shortness of breath.   Cardiovascular:  Negative for chest pain, palpitations and leg swelling.  Gastrointestinal:  Negative for abdominal pain, diarrhea, nausea and vomiting.  Genitourinary:  Negative for difficulty urinating and dysuria.  Musculoskeletal:  Positive for neck pain. Negative for myalgias.  Skin:  Negative for color change and rash.  Neurological:  Negative for dizziness and headaches.  Psychiatric/Behavioral:  Negative for agitation and dysphoric mood.        Objective:     BP 120/74   Pulse 73   Temp 98.2 F (36.8 C)   Resp 16   Ht 5' (1.524 m)   Wt 125 lb 9.6 oz (57 kg)   LMP 11/28/1998   SpO2 98%   BMI 24.53 kg/m  Wt Readings from Last 3 Encounters:  03/28/24 125 lb 9.6 oz (57 kg)  03/01/24 124 lb 6.4 oz (56.4 kg)  01/26/24 123 lb (55.8 kg)    Physical Exam Vitals reviewed.   Constitutional:      General: She is not in acute distress.    Appearance: Normal appearance.  HENT:     Head: Normocephalic and atraumatic.     Right Ear: External ear normal.     Left Ear: External ear normal.     Mouth/Throat:     Pharynx: No oropharyngeal exudate or posterior oropharyngeal erythema.  Eyes:     General: No scleral icterus.       Right eye: No discharge.        Left eye: No discharge.     Conjunctiva/sclera: Conjunctivae normal.  Neck:     Thyroid: No thyromegaly.  Cardiovascular:     Rate and  Rhythm: Normal rate and regular rhythm.  Pulmonary:     Effort: No respiratory distress.     Breath sounds: Normal breath sounds. No wheezing.  Abdominal:     General: Bowel sounds are normal.     Palpations: Abdomen is soft.     Tenderness: There is no abdominal tenderness.  Musculoskeletal:        General: No swelling.     Cervical back: Neck supple. No tenderness.     Comments: Neck pain - noticed more with rotation of her head.   Lymphadenopathy:     Cervical: No cervical adenopathy.  Skin:    Findings: No erythema or rash.  Neurological:     Mental Status: She is alert.  Psychiatric:        Mood and Affect: Mood normal.        Behavior: Behavior normal.         Outpatient Encounter Medications as of 03/28/2024  Medication Sig   acetaminophen (TYLENOL) 500 MG tablet Take 500 mg by mouth every 6 (six) hours as needed.   amLODipine (NORVASC) 5 MG tablet Take 1 tablet (5 mg total) by mouth daily.   anastrozole (ARIMIDEX) 1 MG tablet Take 1 tablet (1 mg total) by mouth daily.   Ascorbic Acid (VITAMIN C) 1000 MG tablet Take 1,000 mg by mouth daily.   atorvastatin (LIPITOR) 40 MG tablet Take 1 tablet (40 mg total) by mouth daily.   cetirizine (ZYRTEC) 10 MG tablet Take 1 tablet (10 mg total) by mouth daily.   Cholecalciferol (VITAMIN D3) 25 MCG (1000 UT) CAPS Take 1 capsule by mouth daily.   CRANBERRY EXTRACT PO Take 1 capsule by mouth 2 (two) times daily.    D-Mannose 500 MG CAPS Take 500 mg by mouth in the morning and at bedtime.   hydrochlorothiazide (HYDRODIURIL) 12.5 MG tablet Take 1 tablet (12.5 mg total) by mouth daily.   ibuprofen (ADVIL) 600 MG tablet Take 1 tablet (600 mg total) by mouth 3 (three) times daily.   losartan (COZAAR) 100 MG tablet Take 1 tablet (100 mg total) by mouth daily.   polyethylene glycol (MIRALAX / GLYCOLAX) 17 g packet Take 17 g by mouth every other day.   Probiotic Product (ALIGN PO) Take by mouth as needed.   triamcinolone (NASACORT) 55 MCG/ACT nasal inhaler Place 2 sprays into the nose as needed.   [DISCONTINUED] amLODipine (NORVASC) 5 MG tablet Take 1 tablet (5 mg total) by mouth daily.   [DISCONTINUED] atorvastatin (LIPITOR) 40 MG tablet Take 1 tablet (40 mg total) by mouth daily.   [DISCONTINUED] cetirizine (ZYRTEC) 10 MG tablet Take 1 tablet (10 mg total) by mouth daily.   [DISCONTINUED] hydrochlorothiazide (HYDRODIURIL) 12.5 MG tablet Take 1 tablet (12.5 mg total) by mouth daily.   [DISCONTINUED] losartan (COZAAR) 100 MG tablet Take 1 tablet (100 mg total) by mouth daily.   No facility-administered encounter medications on file as of 03/28/2024.     Lab Results  Component Value Date   WBC 5.9 11/08/2023   HGB 12.8 11/08/2023   HCT 38.4 11/08/2023   PLT 226.0 11/08/2023   GLUCOSE 89 03/24/2024   CHOL 154 03/24/2024   TRIG 96.0 03/24/2024   HDL 57.40 03/24/2024   LDLDIRECT 103.0 05/21/2015   LDLCALC 77 03/24/2024   ALT 21 03/24/2024   AST 18 03/24/2024   NA 141 03/24/2024   K 3.7 03/24/2024   CL 105 03/24/2024   CREATININE 0.76 03/24/2024   BUN 19 03/24/2024  CO2 29 03/24/2024   TSH 1.50 03/24/2024   INR 1.0 03/17/2022    DG Bone Density Result Date: 08/09/2023 EXAM: DUAL X-RAY ABSORPTIOMETRY (DXA) FOR BONE MINERAL DENSITY IMPRESSION: Your patient Cade Olberding completed a BMD test on 08/09/2023 using the [employer] DXA System (software version: 14.10) manufactured by [employer]. The following summarizes the results of our evaluation. Technologist:VLM PATIENT BIOGRAPHICAL: Name: Lakelyn, Straus Patient ID: [id number] Birth Date: 03-03-1946 Height: 62.0 in. Gender: Female Exam Date: 08/09/2023 Weight: 125.6 lbs. Indications: Caucasian, History of Breast Cancer, Hysterectomy, Postmenopausal Fractures: Treatments: calcium w/ vit D DENSITOMETRY RESULTS: Site          Region     Measured Date Measured Age WHO Classification Young Adult T-score BMD         %Change vs. Previous Significant Change (*) Right Forearm Radius 33% 08/09/[age].5 Oste[age] -1.6 0.737 g/cm2 - - DualFemur Total Left 8/09/2023 77.5 Osteoporosis -2.6 0.686 g/cm2 - - ASSESSMENT: The BMD measured at Femur Total Left is 0.686 g/cm2 with a T-score of -2.6. This patient is considered osteoporotic according to World Health Organization [facility]) criteria. Lumbar spine was not utilized due to advanced degenerative changes and scoliosis. The scan quality is good. World Science writer [facility]) criteria for post-menopausal, Caucasian Women: Normal:                   T-score at or above -1 SD Osteopenia/low bone mass: T-score between -1 and -2.5 SD Osteoporosis:             T-score at or below -2.5 SD RECOMMENDATIONS: 1. All patients should optimize calcium and vitamin D intake. 2. Consider FDA-approved medical therapies in postmenopausal women and men aged 53 years and older, based on the following: a. A hip or vertebral(clinical or morphometric) fracture b. T-score < -2.5 at the femoral neck or spine after appropriate evaluation to exclude secondary causes c. Low bone mass (T-score between -1.0 and -2.5 at the femoral neck or spine) and a 10-year probability of a hip fracture > 3% or a 10-year probability of a major osteoporosis-related fracture > 20% based on the [place] -adapted WHO algorithm 3. Clinician judgment and/or patient preferences may indicate treatment for people with 10-year fracture probabilities above or below these  levels FOLLOW-UP: People with diagnosed cases of osteoporosis or at high risk for fracture should have regular bone mineral density tests. For patients eligible for Medicare, routine testing is allowed once every 2 years. The testing frequency can be increased to one year for patients who have rapidly progressing disease, those who are receiving or discontinuing medical therapy to restore bone mass, or have additional risk factors. I have reviewed this report, and agree with the above findings. [facility] Electronically Signed   By: Alinda Apley M.D.   On: 08/09/2023 14:57       Assessment & Plan:  Left arm pain -     Ambulatory referral to Physical Therapy  Hypercholesterolemia Assessment & Plan: Continue lipitor.  Low cholesterol diet and exercise.  Follow lipid panel and liver function tests. No changes today.   Orders: -     Basic metabolic panel with GFR; Future -     Lipid panel; Future -     Hepatic function panel; Future  Carcinoma of upper-outer quadrant of left breast in female, estrogen receptor positive (HCC) Assessment & Plan: Left breast stage I breast cancer ER/PR positive HER2 negative [s/p lumpectomy [facility]; Dr.Hwang] ; pT1b pSnLNx-M0- G-2.  S/p left breast partial mastectomy 04/22/23. Had to go back for f/u surgery 05/20/23.  XRT.  Incision - closed.  Started anastrozole. Previously breast lesion - resolved.  Follow.    Environmental allergies Assessment & Plan: Stable on current regimen.  Follow.    Primary hypertension Assessment & Plan: Continue amlodipine and hctz. Follow pressures. Follow metabolic panel. No changes today.    Osteoporosis without current pathological fracture, unspecified osteoporosis type Assessment & Plan: Had intolerance to fosamax. Saw endocrinology 02/14/24 - recommended reclast. Due to receive reclast infusion today.   Stress Assessment & Plan: Overall appears to be handling things well.  Will notify me if feels needs  further intervention. Follow.    Neck pain Assessment & Plan: Neck pain as outlined. Exam as outlined. No radicular symptoms. PT to evaluate and treat.    Skin mole Assessment & Plan: Plans to f/u with dermatology.    Pain of toe of left foot Assessment & Plan: Left second toe pain as outlined. Discussed dividers. F/u with podiatry as planned.    Other orders -     amLODIPine Besylate; Take 1 tablet (5 mg total) by mouth daily.  Dispense: 90 tablet; Refill: 3 -     Atorvastatin Calcium; Take 1 tablet (40 mg total) by mouth daily.  Dispense: 90 tablet; Refill: 3 -     Cetirizine HCl; Take 1 tablet (10 mg total) by mouth daily.  Dispense: 90 tablet; Refill: 3 -     hydroCHLOROthiazide; Take 1 tablet (12.5 mg total) by mouth daily.  Dispense: 90 tablet; Refill: 3 -     Losartan Potassium; Take 1 tablet (100 mg total) by mouth daily.  Dispense: 90 tablet; Refill: 3     Dellar Fenton, MD

## 2024-03-31 ENCOUNTER — Ambulatory Visit: Attending: Internal Medicine

## 2024-03-31 DIAGNOSIS — M25512 Pain in left shoulder: Secondary | ICD-10-CM | POA: Insufficient documentation

## 2024-03-31 DIAGNOSIS — D485 Neoplasm of uncertain behavior of skin: Secondary | ICD-10-CM | POA: Diagnosis not present

## 2024-03-31 DIAGNOSIS — M79602 Pain in left arm: Secondary | ICD-10-CM | POA: Insufficient documentation

## 2024-03-31 DIAGNOSIS — L820 Inflamed seborrheic keratosis: Secondary | ICD-10-CM | POA: Diagnosis not present

## 2024-03-31 DIAGNOSIS — L538 Other specified erythematous conditions: Secondary | ICD-10-CM | POA: Diagnosis not present

## 2024-03-31 DIAGNOSIS — M542 Cervicalgia: Secondary | ICD-10-CM | POA: Insufficient documentation

## 2024-03-31 NOTE — Therapy (Signed)
 OUTPATIENT PHYSICAL THERAPY EVALUATION   Patient Name: Jamie Elliott MRN: [id number] DOB:1946/11/14, 78 y.o., female Today's Date: 03/31/2024  END OF SESSION:  PT End of Session - 03/31/24 0814     Visit Number 1    Number of Visits 17    Date for PT Re-Evaluation 05/26/24    PT Start Time 0815    PT Stop Time 0903    PT Time Calculation (min) 48 min    Activity Tolerance Patient tolerated treatment well    Behavior During Therapy [facility] for tasks assessed/performed             Past Medical History:  Diagnosis Date   Environmental allergies    Hypercholesterolemia    Hypertension    Migraines    Past Surgical History:  Procedure Laterality Date   ABDOMINAL HYSTERECTOMY  12/21/1998   APPENDECTOMY  12/21/1998   BREAST SURGERY  05/2023   at [facility] lumpectomy   CATARACT EXTRACTION W/PHACO Left 04/23/2021   Procedure: CATARACT EXTRACTION PHACO AND INTRAOCULAR LENS PLACEMENT (IOC) LEFT panoptix toric 10.41 01:22.8 12.6%;  Surgeon: Lockie Mola, MD;  Location: [facility];  Service: Ophthalmology;  Laterality: Left;   CATARACT EXTRACTION W/PHACO Right 05/07/2021   Procedure: CATARACT EXTRACTION PHACO AND INTRAOCULAR LENS PLACEMENT (IOC) RIGHT  panoptix 12.85 01:33.8 13.7%;  Surgeon: Lockie Mola, MD;  Location: [facility];  Service: Ophthalmology;  Laterality: Right;   NOSE SURGERY  12/21/1965   SEPTOPLASTY     SHOULDER ARTHROSCOPY WITH OPEN ROTATOR CUFF REPAIR Right 12/05/2015   Procedure: SHOULDER ARTHROSCOPY WITH MINI OPEN ROTATOR CUFF REPAIR. DISTAL CLAVICLE EXCISION;  Surgeon: Juanell Fairly, MD;  Location: [facility];  Service: Orthopedics;  Laterality: Right;   Patient Active Problem List   Diagnosis Date Noted   Sprain of left upper arm 03/01/2024   Rash 12/12/2023   Cellulitis of breast 10/04/2023   Genetic testing 05/24/2023   Carcinoma of upper-outer quadrant of left breast in female, estrogen receptor positive (HCC) 03/22/2023    Viral URI 03/15/2023   Headache 01/14/2023   Fall 08/23/2022   Glossitis 05/02/2022   Aphthous ulcer 04/11/2022   Leukocytosis 04/07/2022   Viral syndrome 03/28/2022   Respiratory tract infection 03/17/2022   Nausea and vomiting 03/17/2022   Vitamin D deficiency 12/11/2021   Mouth sores 10/19/2021   Exposure to COVID-19 virus 08/24/2021   Abnormal mammogram 06/26/2021   History of COVID-19 06/18/2021   Bleeding 03/15/2021   Left foot pain 03/04/2021   Fatigue 01/05/2021   Angioleiomyoma 11/18/2020   Nasal drainage 08/12/2020   Stress 04/22/2020   Sinus congestion 11/08/2019   Fever 09/17/2019   Allergy 07/02/2019   Low back pain 12/11/2018   Left arm pain 07/24/2018   Right hip pain 05/24/2018   Left leg swelling 09/03/2017   Chest pressure 06/16/2017   Altered sensation of foot 06/16/2017   Recurrent UTI 05/12/2017   Constipation 02/16/2016   UTI (urinary tract infection) 01/06/2016   Pain in joint, shoulder region 12/05/2015   Loss of weight 11/25/2015   History of abnormal mammogram 08/26/2015   Health care maintenance 05/19/2015   Osteoporosis 04/17/2014   Abnormal liver function tests 12/17/2013   HTN (hypertension) 12/04/2012   Hypercholesterolemia 12/04/2012   Environmental allergies 12/04/2012    PCP: Dale Webster, MD  REFERRING PROVIDER: Dale Sanford, MD  REFERRING DIAG: [phone] (ICD-10-CM) - Left arm pain  THERAPY DIAG:  Acute pain of left shoulder - Plan: PT plan of care cert/re-cert  Pain  in left arm - Plan: PT plan of care cert/re-cert  Cervicalgia - Plan: PT plan of care cert/re-cert  Rationale for Evaluation and Treatment: Rehabilitation  ONSET DATE: 3 weeks ago from 03/31/2024  SUBJECTIVE:                                                                                                                                                                                                         SUBJECTIVE STATEMENT: L anterior shoulder and  arm   Hand dominance: Left  PERTINENT HISTORY:   L arm pain. Pain began at least 3 weeks ago. Pt lifted a heavy teapot of water and felt a pop in her L shoulder. Pain is a lot better since onset. Pt is also taking exercises for her L UE 3x/week at night. Pt has also had a crick in her neck for more than 3 days which has been bothering her more. Pt was told ehtat her arm is a sprain. Pt also had tingling B hand and arm (along the C5-C6 dermatome)   Blood pressure is controlled.  No latex allergies  PAIN:  Are you having pain? Yes: NPRS scale: 7/10 Pain location: L anterior shoulder and arm Pain description: unable to describe Aggravating factors: supination, pronation, raising her arm Relieving factors: rest  PRECAUTIONS: Osteoporosis, Hx of CA  RED FLAGS: None     WEIGHT BEARING RESTRICTIONS: No  FALLS:  Has patient fallen in last 6 months? No  LIVING ENVIRONMENT: Lives with: lives with their spouse   OCCUPATION: retired  PLOF: Independent  PATIENT GOALS: TP be able to do her exercises without worrying about it.   NEXT MD VISIT: yes (pt does not know)  OBJECTIVE:  Note: Objective measures were completed at Evaluation unless otherwise noted.  DIAGNOSTIC FINDINGS:     PATIENT SURVEYS:  UEFI score: 48/80 (03/31/2024)  COGNITION: Overall cognitive status: Within functional limits for tasks assessed  SENSATION:   POSTURE: forward neck, B protracted shoulders, L lateral shift neck, kyphosis, L scapular protraction > R   PALPATION:    CERVICAL ROM:   Active ROM A/PROM (deg) eval  Flexion full  Extension Full with L posterior lateral neck pain  Right lateral flexion Limited with L posterior lateral neck pull  Left lateral flexion Very limited with R posterior lateral neck pull  Right rotation 80  Left rotation 35 degrees with neck pain at L upper trap/C5 dermatome area, and L lateral neck muscle   (Blank rows = not tested)  UPPER EXTREMITY  ROM:  Active ROM Right eval Left  eval  Shoulder flexion 161 156 (175 AAROM)  Shoulder extension    Shoulder abduction 175 177  Shoulder adduction    Shoulder extension    Shoulder internal rotation (functional) R index finger to L inferior scapular angle Full no pain  Shoulder external rotation (functional) full Full, slight pain  Elbow flexion    Elbow extension    Wrist flexion    Wrist extension    Wrist ulnar deviation    Wrist radial deviation    Wrist pronation    Wrist supination     (Blank rows = not tested)  UPPER EXTREMITY MMT:  MMT Right eval Left eval  Shoulder flexion  4+  Shoulder extension    Shoulder abduction  4+ (able to perform with proper scapular position)  Shoulder adduction    Shoulder extension    Shoulder internal rotation  4+  Shoulder external rotation  4 with L lateral deltoid pain  Middle trapezius    Lower trapezius (upright manually resisted) 4 4-  Elbow flexion  4+  Elbow extension  4  Wrist flexion    Wrist extension    Wrist ulnar deviation    Wrist radial deviation    Wrist pronation    Wrist supination    Grip strength     (Blank rows = not tested)  CERVICAL SPECIAL TESTS:    FUNCTIONAL TESTS:  Decreased pain with return from L shoulder abduction with addition of scapular retraction.    Decreased pain with L cervical rotation with proper cervical posture (chin tuck)      (+) Hawkins-kennedy L shoulder   (+) empty can test     No L biceps pain with isometric, concentric and eccentric contraction  TREATMENT DATE: 03/31/2024                                                                                                                               Therapeutic exercise  Seated chin tucks 10x5 seconds   Seated B scapular retraction 10x5 seconds   Then both combined 10x5 seconds   Reviewed HEP. Pt demonstrated and verbalized understanding.   Improved exercise technique, movement at target joints, use of target  muscles after mod verbal, visual, tactile cues.   Decreased L shoulder pain with scapular retraction Decreased L neck pain with L cervical retraction to decrease protraction postion    PATIENT EDUCATION:  Education details: there-ex, HEP, POC Person educated: Patient Education method: Explanation, Demonstration, Tactile cues, Verbal cues, and Handouts Education comprehension: verbalized understanding and returned demonstration  HOME EXERCISE PROGRAM: Access Code: [email or web address] URL: [email or web address] Date: 03/31/2024 Prepared by: Loralyn Freshwater  Exercises - Seated Cervical Retraction  - 3 x daily - 7 x weekly - 3 sets - 10 reps - 5 seconds hold - Seated Scapular Retraction  - 3 x daily - 7 x weekly - 3 sets - 10 reps - 5 seconds hold  ASSESSMENT:  CLINICAL IMPRESSION: Patient is a  78 y.o. female who was seen today for physical therapy evaluation and treatment for L arm pain. She also presents with altered posture, decreased lower trap strength, altered scapular and cervical mechanics, positive special tests suggesting L shoulder impingement and rotator cuff and biceps involvement, and difficulty perform tasks which involve reaching forward and to the side. Pt will benefit from skilled physical therapy services to address the aforementioned deficits.        OBJECTIVE IMPAIRMENTS: decreased ROM, decreased strength, impaired UE functional use, improper body mechanics, postural dysfunction, and pain.   ACTIVITY LIMITATIONS: carrying, lifting, and reach over head  PARTICIPATION LIMITATIONS:   PERSONAL FACTORS: Age, Fitness, and 1 comorbidity: HTN, hx of CA  are also affecting patient's functional outcome.   REHAB POTENTIAL: Fair    CLINICAL DECISION MAKING: Stable/uncomplicated pain has improved since onset  EVALUATION COMPLEXITY: Low   GOALS: Goals reviewed with patient? Yes  SHORT TERM GOALS: Target date: 04/14/2024  Pt will be independent with her initial  HEP to decrease L shoulder and neck pain, improve strength, function, and ability to reach and turn her head to the L more comfortably.  Baseline: Pt has started her initial HEP (03/31/2024) Goal status: INITIAL    LONG TERM GOALS: Target date: 05/26/2024  Pt will have a decrease in L arm pain to 4/10 or less at worst to promote ability to lift as well as reach more comfortably. Baseline: at least 7/10 at worst for the past 3 months (4/11/20025) Goal status: INITIAL  2.  Pt will improve her L lower trap and ER muscle strength by at least 1/2 MMT grade to promote ability to reach with her L arm more comfortably.  Baseline:  MMT Right eval Left eval  Shoulder external rotation  4 with L lateral deltoid pain  Lower trapezius (upright manually resisted) 4 4-   (03/31/2024)  Goal status: INITIAL  3.  Pt will improve her L cervical AROM to 60 degrees without pain to promote ability to look around more comfortably.  Baseline:  Active ROM A/PROM (deg) eval  Right rotation 80  Left rotation 35 degrees with neck pain at L upper trap/C5 dermatome area, and L lateral neck muscle   (03/31/2024)  Goal status: INITIAL  4.  Patient will improve her UE functional Index (UEFI) sore by at least 8 points as a demonstration of improved function Baseline: UEFI score: 48/80 (03/31/2024) Goal status: INITIAL   PLAN:  PT FREQUENCY: 1-2x/week  PT DURATION: 8 weeks  PLANNED INTERVENTIONS: 97110-Therapeutic exercises, 97530- Therapeutic activity, 97112- Neuromuscular re-education, 97535- Self Care, 16109- Manual therapy, [id number]- Electrical stimulation (unattended), [phone]- Ionotophoresis 4mg /ml Dexamethasone, and Patient/Family education  PLAN FOR NEXT SESSION: posture, cervical retraction, scapular strengthening, thoracic extension, glenohumeral mechanics, ER muscle strengthening, manual techniques, modalities PRN   Raydin Bielinski, PT, DPT 03/31/2024, 11:51 AM

## 2024-04-02 ENCOUNTER — Encounter: Payer: Self-pay | Admitting: Internal Medicine

## 2024-04-02 DIAGNOSIS — M79676 Pain in unspecified toe(s): Secondary | ICD-10-CM | POA: Insufficient documentation

## 2024-04-02 DIAGNOSIS — M542 Cervicalgia: Secondary | ICD-10-CM | POA: Insufficient documentation

## 2024-04-02 DIAGNOSIS — D229 Melanocytic nevi, unspecified: Secondary | ICD-10-CM | POA: Insufficient documentation

## 2024-04-02 NOTE — Assessment & Plan Note (Signed)
 Had intolerance to fosamax. Saw endocrinology 02/14/24 - recommended reclast. Due to receive reclast infusion today.

## 2024-04-02 NOTE — Assessment & Plan Note (Signed)
Stable on current regimen.  Follow.   

## 2024-04-02 NOTE — Assessment & Plan Note (Signed)
 Continue amlodipine and hctz. Follow pressures. Follow metabolic panel. No changes today.

## 2024-04-02 NOTE — Assessment & Plan Note (Signed)
 Left second toe pain as outlined. Discussed dividers. F/u with podiatry as planned.

## 2024-04-02 NOTE — Assessment & Plan Note (Signed)
Plans to f/u with dermatology.

## 2024-04-02 NOTE — Assessment & Plan Note (Signed)
 Left breast stage I breast cancer ER/PR positive HER2 negative [s/p lumpectomy [facility]; Dr.Hwang] ; pT1b pSnLNx-M0- G-2. S/p left breast partial mastectomy 04/22/23. Had to go back for f/u surgery 05/20/23.  XRT.  Incision - closed.  Started anastrozole. Previously breast lesion - resolved.  Follow.

## 2024-04-02 NOTE — Assessment & Plan Note (Signed)
 Continue lipitor.  Low cholesterol diet and exercise.  Follow lipid panel and liver function tests. No changes today.

## 2024-04-02 NOTE — Assessment & Plan Note (Signed)
 Neck pain as outlined. Exam as outlined. No radicular symptoms. PT to evaluate and treat.

## 2024-04-02 NOTE — Assessment & Plan Note (Signed)
 Overall appears to be handling things well.  Will notify me if feels needs further intervention. Follow.

## 2024-04-04 ENCOUNTER — Ambulatory Visit

## 2024-04-04 DIAGNOSIS — M79602 Pain in left arm: Secondary | ICD-10-CM

## 2024-04-04 DIAGNOSIS — M25512 Pain in left shoulder: Secondary | ICD-10-CM | POA: Diagnosis not present

## 2024-04-04 DIAGNOSIS — M542 Cervicalgia: Secondary | ICD-10-CM | POA: Diagnosis not present

## 2024-04-04 NOTE — Therapy (Signed)
 OUTPATIENT PHYSICAL THERAPY TREATMENT   Patient Name: Jamie Elliott MRN: [id number] DOB:[birth year], 78 y.o., female Today's Date: 04/04/2024  END OF SESSION:  PT End of Session - 04/04/24 1442     Visit Number 2    Number of Visits 17    Date for PT Re-Evaluation 05/26/24    PT Start Time 1443   Pt arrived late   PT Stop Time 1513    PT Time Calculation (min) 30 min    Activity Tolerance Patient tolerated treatment well    Behavior During Therapy [facility] for tasks assessed/performed              Past Medical History:  Diagnosis Date   Environmental allergies    Hypercholesterolemia    Hypertension    Migraines    Past Surgical History:  Procedure Laterality Date   ABDOMINAL HYSTERECTOMY  12/21/1998   APPENDECTOMY  12/21/1998   BREAST SURGERY  05/2023   at [facility] lumpectomy   CATARACT EXTRACTION W/PHACO Left 04/23/2021   Procedure: CATARACT EXTRACTION PHACO AND INTRAOCULAR LENS PLACEMENT (IOC) LEFT panoptix toric 10.41 01:22.8 12.6%;  Surgeon: Annell Kidney, MD;  Location: [facility];  Service: Ophthalmology;  Laterality: Left;   CATARACT EXTRACTION W/PHACO Right 05/07/2021   Procedure: CATARACT EXTRACTION PHACO AND INTRAOCULAR LENS PLACEMENT (IOC) RIGHT  panoptix 12.85 01:33.8 13.7%;  Surgeon: Annell Kidney, MD;  Location: [facility];  Service: Ophthalmology;  Laterality: Right;   NOSE SURGERY  12/21/1965   SEPTOPLASTY     SHOULDER ARTHROSCOPY WITH OPEN ROTATOR CUFF REPAIR Right 12/05/2015   Procedure: SHOULDER ARTHROSCOPY WITH MINI OPEN ROTATOR CUFF REPAIR. DISTAL CLAVICLE EXCISION;  Surgeon: Rande Bushy, MD;  Location: [facility] ORS;  Service: Orthopedics;  Laterality: Right;   Patient Active Problem List   Diagnosis Date Noted   Neck pain 04/02/2024   Skin mole 04/02/2024   Toe pain 04/02/2024   Sprain of left upper arm 03/01/2024   Rash 12/12/2023   Genetic testing 05/24/2023   Carcinoma of upper-outer quadrant of left breast in  female, estrogen receptor positive (HCC) 03/22/2023   Viral URI 03/15/2023   Headache 01/14/2023   Glossitis 05/02/2022   Aphthous ulcer 04/11/2022   Leukocytosis 04/07/2022   Viral syndrome 03/28/2022   Respiratory tract infection 03/17/2022   Nausea and vomiting 03/17/2022   Vitamin D deficiency 12/11/2021   Mouth sores 10/19/2021   Abnormal mammogram 06/26/2021   History of COVID-19 06/18/2021   Bleeding 03/15/2021   Left foot pain 03/04/2021   Fatigue 01/05/2021   Angioleiomyoma 11/18/2020   Nasal drainage 08/12/2020   Stress 04/22/2020   Sinus congestion 11/08/2019   Allergy 07/02/2019   Low back pain 12/11/2018   Left arm pain 07/24/2018   Right hip pain 05/24/2018   Left leg swelling 09/03/2017   Chest pressure 06/16/2017   Altered sensation of foot 06/16/2017   Recurrent UTI 05/12/2017   Constipation 02/16/2016   UTI (urinary tract infection) 01/06/2016   Pain in joint, shoulder region 12/05/2015   Loss of weight 11/25/2015   History of abnormal mammogram 08/26/2015   Health care maintenance 05/19/2015   Osteoporosis 04/17/2014   Abnormal liver function tests 12/17/2013   HTN (hypertension) 12/04/2012   Hypercholesterolemia 12/04/2012   Environmental allergies 12/04/2012    PCP: Dellar Fenton, MD  REFERRING PROVIDER: Dellar Fenton, MD  REFERRING DIAG: [phone] (ICD-10-CM) - Left arm pain  THERAPY DIAG:  Acute pain of left shoulder  Pain in left arm  Cervicalgia  Rationale for  Evaluation and Treatment: Rehabilitation  ONSET DATE: 3 weeks ago from 03/31/2024  SUBJECTIVE:                                                                                                                                                                                                         SUBJECTIVE STATEMENT: Mid back bothered her the next day after last session. L arm is doing today. Has been practicing her posture. The neck pain is a lot less since after session.     Hand dominance: Left  PERTINENT HISTORY:   L arm pain. Pain began at least 3 weeks ago. Pt lifted a heavy teapot of water and felt a pop in her L shoulder. Pain is a lot better since onset. Pt is also taking exercises for her L UE 3x/week at night. Pt has also had a crick in her neck for more than 3 days which has been bothering her more. Pt was told ehtat her arm is a sprain. Pt also had tingling B hand and arm (along the C5-C6 dermatome)   Blood pressure is controlled.  No latex allergies  PAIN:  Are you having pain? Yes: NPRS scale: 7/10 Pain location: L anterior shoulder and arm Pain description: unable to describe Aggravating factors: supination, pronation, raising her arm Relieving factors: rest  PRECAUTIONS: Osteoporosis, Hx of CA  RED FLAGS: None     WEIGHT BEARING RESTRICTIONS: No  FALLS:  Has patient fallen in last 6 months? No  LIVING ENVIRONMENT: Lives with: lives with their spouse   OCCUPATION: retired  PLOF: Independent  PATIENT GOALS: TP be able to do her exercises without worrying about it.   NEXT MD VISIT: yes (pt does not know)  OBJECTIVE:  Note: Objective measures were completed at Evaluation unless otherwise noted.  DIAGNOSTIC FINDINGS:     PATIENT SURVEYS:  UEFI score: 48/80 (03/31/2024)  COGNITION: Overall cognitive status: Within functional limits for tasks assessed  SENSATION:   POSTURE: forward neck, B protracted shoulders, L lateral shift neck, kyphosis, L scapular protraction > R   PALPATION:    CERVICAL ROM:   Active ROM A/PROM (deg) eval  Flexion full  Extension Full with L posterior lateral neck pain  Right lateral flexion Limited with L posterior lateral neck pull  Left lateral flexion Very limited with R posterior lateral neck pull  Right rotation 80  Left rotation 35 degrees with neck pain at L upper trap/C5 dermatome area, and L lateral neck muscle   (Blank rows = not tested)  UPPER EXTREMITY  ROM:  Active  ROM Right eval Left eval  Shoulder flexion 161 156 (175 AAROM)  Shoulder extension    Shoulder abduction 175 177  Shoulder adduction    Shoulder extension    Shoulder internal rotation (functional) R index finger to L inferior scapular angle Full no pain  Shoulder external rotation (functional) full Full, slight pain  Elbow flexion    Elbow extension    Wrist flexion    Wrist extension    Wrist ulnar deviation    Wrist radial deviation    Wrist pronation    Wrist supination     (Blank rows = not tested)  UPPER EXTREMITY MMT:  MMT Right eval Left eval  Shoulder flexion  4+  Shoulder extension    Shoulder abduction  4+ (able to perform with proper scapular position)  Shoulder adduction    Shoulder extension    Shoulder internal rotation  4+  Shoulder external rotation  4 with L lateral deltoid pain  Middle trapezius    Lower trapezius (upright manually resisted) 4 4-  Elbow flexion  4+  Elbow extension  4  Wrist flexion    Wrist extension    Wrist ulnar deviation    Wrist radial deviation    Wrist pronation    Wrist supination    Grip strength     (Blank rows = not tested)  CERVICAL SPECIAL TESTS:    FUNCTIONAL TESTS:  Decreased pain with return from L shoulder abduction with addition of scapular retraction.    Decreased pain with L cervical rotation with proper cervical posture (chin tuck)      (+) Hawkins-kennedy L shoulder   (+) empty can test     No L biceps pain with isometric, concentric and eccentric contraction  TREATMENT DATE: 04/04/2024                                                                                                                               Therapeutic exercise  Seated B shoulder ER yellow band with scapular retraction 10x  L anterior arm discomfort  Reclined   Hooklying    B shoulder ER yellow band with L arm propped onto folded pillow 10x3    B scapular retraction  With shoulder flexion to 90 degrees to  promote better glenohumeral control 10x2  With L shoulder scaption to 90 degrees to promote better glenohumeral control 10x2  Cervical nod 10x5 seconds for 3 sets  Improved exercise technique, movement at target joints, use of target muscles after mod verbal, visual, tactile cues.     PATIENT EDUCATION:  Education details: there-ex, HEP, POC Person educated: Patient Education method: Explanation, Demonstration, Tactile cues, Verbal cues, and Handouts Education comprehension: verbalized understanding and returned demonstration  HOME EXERCISE PROGRAM: Access Code: [email or web address] URL: [email or web address] Date: 03/31/2024 Prepared by: Loralyn Freshwater  Exercises - Seated Cervical Retraction  - 3 x daily - 7 x weekly - 3 sets - 10 reps - 5 seconds hold -  Seated Scapular Retraction  - 3 x daily - 7 x weekly - 3 sets - 10 reps - 5 seconds hold - Supine Head Nod with Deep Neck Flexor Activation  - 1 x daily - 7 x weekly - 3 sets - 10 reps - 5  seconds hold    ASSESSMENT:  CLINICAL IMPRESSION: Pt arrived late so session was adjusted accordingly. Worked on scapular control and ER muscle strengthening to help decrease anterior impingement of L shoulder when raising her arm. Also worked on anterior cervical muscle strengthening to promote ability to turn her head more comfortably. Pt tolerated session well without aggravation of symptoms. Pt will benefit from continued skilled physical therapy services to decrease pain, improve strength and function.           OBJECTIVE IMPAIRMENTS: decreased ROM, decreased strength, impaired UE functional use, improper body mechanics, postural dysfunction, and pain.   ACTIVITY LIMITATIONS: carrying, lifting, and reach over head  PARTICIPATION LIMITATIONS:   PERSONAL FACTORS: Age, Fitness, and 1 comorbidity: HTN, hx of CA  are also affecting patient's functional outcome.   REHAB POTENTIAL: Fair    CLINICAL DECISION MAKING:  Stable/uncomplicated pain has improved since onset  EVALUATION COMPLEXITY: Low   GOALS: Goals reviewed with patient? Yes  SHORT TERM GOALS: Target date: 04/14/2024  Pt will be independent with her initial HEP to decrease L shoulder and neck pain, improve strength, function, and ability to reach and turn her head to the L more comfortably.  Baseline: Pt has started her initial HEP (03/31/2024) Goal status: INITIAL    LONG TERM GOALS: Target date: 05/26/2024  Pt will have a decrease in L arm pain to 4/10 or less at worst to promote ability to lift as well as reach more comfortably. Baseline: at least 7/10 at worst for the past 3 months (4/11/20025) Goal status: INITIAL  2.  Pt will improve her L lower trap and ER muscle strength by at least 1/2 MMT grade to promote ability to reach with her L arm more comfortably.  Baseline:  MMT Right eval Left eval  Shoulder external rotation  4 with L lateral deltoid pain  Lower trapezius (upright manually resisted) 4 4-   (03/31/2024)  Goal status: INITIAL  3.  Pt will improve her L cervical AROM to 60 degrees without pain to promote ability to look around more comfortably.  Baseline:  Active ROM A/PROM (deg) eval  Right rotation 80  Left rotation 35 degrees with neck pain at L upper trap/C5 dermatome area, and L lateral neck muscle   (03/31/2024)  Goal status: INITIAL  4.  Patient will improve her UE functional Index (UEFI) sore by at least 8 points as a demonstration of improved function Baseline: UEFI score: 48/80 (03/31/2024) Goal status: INITIAL   PLAN:  PT FREQUENCY: 1-2x/week  PT DURATION: 8 weeks  PLANNED INTERVENTIONS: 97110-Therapeutic exercises, 97530- Therapeutic activity, 97112- Neuromuscular re-education, 97535- Self Care, 16109- Manual therapy, G0283- Electrical stimulation (unattended), [phone]- Ionotophoresis 4mg /ml Dexamethasone, and Patient/Family education  PLAN FOR NEXT SESSION: posture, cervical retraction,  scapular strengthening, thoracic extension, glenohumeral mechanics, ER muscle strengthening, manual techniques, modalities PRN   Suellyn Meenan, PT, DPT 04/04/2024, 5:12 PM

## 2024-04-06 ENCOUNTER — Encounter

## 2024-04-11 ENCOUNTER — Encounter

## 2024-04-13 ENCOUNTER — Telehealth: Payer: Self-pay | Admitting: Urology

## 2024-04-13 ENCOUNTER — Ambulatory Visit

## 2024-04-13 DIAGNOSIS — M79602 Pain in left arm: Secondary | ICD-10-CM | POA: Diagnosis not present

## 2024-04-13 DIAGNOSIS — M25512 Pain in left shoulder: Secondary | ICD-10-CM | POA: Diagnosis not present

## 2024-04-13 DIAGNOSIS — M542 Cervicalgia: Secondary | ICD-10-CM | POA: Diagnosis not present

## 2024-04-13 NOTE — Telephone Encounter (Signed)
 Pt called office w/UTI symptoms.  We have no availability within the next week.  Pt instructed to call pcp or visit an urgent care/walk in clinic.

## 2024-04-13 NOTE — Therapy (Signed)
 OUTPATIENT PHYSICAL THERAPY TREATMENT   Patient Name: Jamie Elliott MRN: [id number] DOB:[birth year], 78 y.o., female Today's Date: 04/13/2024  END OF SESSION:  PT End of Session - 04/13/24 1112     Visit Number 3    Number of Visits 17    Date for PT Re-Evaluation 05/26/24    PT Start Time 1115    PT Stop Time 1201    PT Time Calculation (min) 46 min    Activity Tolerance Patient tolerated treatment well    Behavior During Therapy [facility] for tasks assessed/performed               Past Medical History:  Diagnosis Date   Environmental allergies    Hypercholesterolemia    Hypertension    Migraines    Past Surgical History:  Procedure Laterality Date   ABDOMINAL HYSTERECTOMY  12/21/1998   APPENDECTOMY  12/21/1998   BREAST SURGERY  05/2023   at [facility] lumpectomy   CATARACT EXTRACTION W/PHACO Left 04/23/2021   Procedure: CATARACT EXTRACTION PHACO AND INTRAOCULAR LENS PLACEMENT (IOC) LEFT panoptix toric 10.41 01:22.8 12.6%;  Surgeon: Annell Kidney, MD;  Location: [facility];  Service: Ophthalmology;  Laterality: Left;   CATARACT EXTRACTION W/PHACO Right 05/07/2021   Procedure: CATARACT EXTRACTION PHACO AND INTRAOCULAR LENS PLACEMENT (IOC) RIGHT  panoptix 12.85 01:33.8 13.7%;  Surgeon: Annell Kidney, MD;  Location: [facility];  Service: Ophthalmology;  Laterality: Right;   NOSE SURGERY  12/21/1965   SEPTOPLASTY     SHOULDER ARTHROSCOPY WITH OPEN ROTATOR CUFF REPAIR Right 12/05/2015   Procedure: SHOULDER ARTHROSCOPY WITH MINI OPEN ROTATOR CUFF REPAIR. DISTAL CLAVICLE EXCISION;  Surgeon: Rande Bushy, MD;  Location: [facility] ORS;  Service: Orthopedics;  Laterality: Right;   Patient Active Problem List   Diagnosis Date Noted   Neck pain 04/02/2024   Skin mole 04/02/2024   Toe pain 04/02/2024   Sprain of left upper arm 03/01/2024   Rash 12/12/2023   Genetic testing 05/24/2023   Carcinoma of upper-outer quadrant of left breast in female, estrogen  receptor positive (HCC) 03/22/2023   Viral URI 03/15/2023   Headache 01/14/2023   Glossitis 05/02/2022   Aphthous ulcer 04/11/2022   Leukocytosis 04/07/2022   Viral syndrome 03/28/2022   Respiratory tract infection 03/17/2022   Nausea and vomiting 03/17/2022   Vitamin D  deficiency 12/11/2021   Mouth sores 10/19/2021   Abnormal mammogram 06/26/2021   History of COVID-19 06/18/2021   Bleeding 03/15/2021   Left foot pain 03/04/2021   Fatigue 01/05/2021   Angioleiomyoma 11/18/2020   Nasal drainage 08/12/2020   Stress 04/22/2020   Sinus congestion 11/08/2019   Allergy 07/02/2019   Low back pain 12/11/2018   Left arm pain 07/24/2018   Right hip pain 05/24/2018   Left leg swelling 09/03/2017   Chest pressure 06/16/2017   Altered sensation of foot 06/16/2017   Recurrent UTI 05/12/2017   Constipation 02/16/2016   UTI (urinary tract infection) 01/06/2016   Pain in joint, shoulder region 12/05/2015   Loss of weight 11/25/2015   History of abnormal mammogram 08/26/2015   Health care maintenance 05/19/2015   Osteoporosis 04/17/2014   Abnormal liver function tests 12/17/2013   HTN (hypertension) 12/04/2012   Hypercholesterolemia 12/04/2012   Environmental allergies 12/04/2012    PCP: Dellar Fenton, MD  REFERRING PROVIDER: Dellar Fenton, MD  REFERRING DIAG: [phone] (ICD-10-CM) - Left arm pain  THERAPY DIAG:  Acute pain of left shoulder  Pain in left arm  Cervicalgia  Rationale for Evaluation and Treatment:  Rehabilitation  ONSET DATE: 3 weeks ago from 03/31/2024  SUBJECTIVE:                                                                                                                                                                                                         SUBJECTIVE STATEMENT: L shoulder and arm: No pain currently at rest, has a lot of times when it does not bother her.  2/10 when raising her arm up.  L upper trap currently bothering her: 3/10 when  turning her head to the L.      Hand dominance: Left  PERTINENT HISTORY:   L arm pain. Pain began at least 3 weeks ago. Pt lifted a heavy teapot of water and felt a pop in her L shoulder. Pain is a lot better since onset. Pt is also taking exercises for her L UE 3x/week at night. Pt has also had a crick in her neck for more than 3 days which has been bothering her more. Pt was told ehtat her arm is a sprain. Pt also had tingling B hand and arm (along the C5-C6 dermatome)   Blood pressure is controlled.  No latex allergies  PAIN:  Are you having pain? Yes: NPRS scale: 7/10 Pain location: L anterior shoulder and arm Pain description: unable to describe Aggravating factors: supination, pronation, raising her arm Relieving factors: rest  PRECAUTIONS: Osteoporosis, Hx of CA  RED FLAGS: None     WEIGHT BEARING RESTRICTIONS: No  FALLS:  Has patient fallen in last 6 months? No  LIVING ENVIRONMENT: Lives with: lives with their spouse   OCCUPATION: retired  PLOF: Independent  PATIENT GOALS: TP be able to do her exercises without worrying about it.   NEXT MD VISIT: yes (pt does not know)  OBJECTIVE:  Note: Objective measures were completed at Evaluation unless otherwise noted.  DIAGNOSTIC FINDINGS:     PATIENT SURVEYS:  UEFI score: 48/80 (03/31/2024)  COGNITION: Overall cognitive status: Within functional limits for tasks assessed  SENSATION:   POSTURE: forward neck, B protracted shoulders, L lateral shift neck, kyphosis, L scapular protraction > R   PALPATION:    CERVICAL ROM:   Active ROM A/PROM (deg) eval  Flexion full  Extension Full with L posterior lateral neck pain  Right lateral flexion Limited with L posterior lateral neck pull  Left lateral flexion Very limited with R posterior lateral neck pull  Right rotation 80  Left rotation 35 degrees with neck pain at L upper trap/C5 dermatome area, and L lateral neck muscle   (Blank  rows = not  tested)  UPPER EXTREMITY ROM:  Active ROM Right eval Left eval  Shoulder flexion 161 156 (175 AAROM)  Shoulder extension    Shoulder abduction 175 177  Shoulder adduction    Shoulder extension    Shoulder internal rotation (functional) R index finger to L inferior scapular angle Full no pain  Shoulder external rotation (functional) full Full, slight pain  Elbow flexion    Elbow extension    Wrist flexion    Wrist extension    Wrist ulnar deviation    Wrist radial deviation    Wrist pronation    Wrist supination     (Blank rows = not tested)  UPPER EXTREMITY MMT:  MMT Right eval Left eval  Shoulder flexion  4+  Shoulder extension    Shoulder abduction  4+ (able to perform with proper scapular position)  Shoulder adduction    Shoulder extension    Shoulder internal rotation  4+  Shoulder external rotation  4 with L lateral deltoid pain  Middle trapezius    Lower trapezius (upright manually resisted) 4 4-  Elbow flexion  4+  Elbow extension  4  Wrist flexion    Wrist extension    Wrist ulnar deviation    Wrist radial deviation    Wrist pronation    Wrist supination    Grip strength     (Blank rows = not tested)  CERVICAL SPECIAL TESTS:    FUNCTIONAL TESTS:  Decreased pain with return from L shoulder abduction with addition of scapular retraction.    Decreased pain with L cervical rotation with proper cervical posture (chin tuck)      (+) Hawkins-kennedy L shoulder   (+) empty can test     No L biceps pain with isometric, concentric and eccentric contraction  TREATMENT DATE: 04/13/2024                                                                                                                               Therapeutic exercise  Seated L triceps extension isometrics, L wrist on table 10x3 with 5 second holds  Reviewed and given as part of her HEP. Pt demonstrated and verbalized understanding. Handout provided.   Time taken to fix pt schedule for next  week.   Seated manually resisted scapular retraction 10x3 with 5 second holds  Seated manually resisted L shoulder ER and triceps extension isometrics, neutral arm, and L elbow flexed at about 80 degrees 10x5 seconds  Seated L scapular depression isometrics, with L forearm on arm rest 10x5 seconds for 2 sets   Improved exercise technique, movement at target joints, use of target muscles after mod verbal, visual, tactile cues.      Manual therapy   Seated STM L UT to decrease tension to shoulder and neck  Decreased L shoulder and arm pain when raising it up and decreased L neck pain with L cervical rotation.  PATIENT EDUCATION:  Education details: there-ex, HEP, POC Person educated: Patient Education method: Explanation, Demonstration, Tactile cues, Verbal cues, and Handouts Education comprehension: verbalized understanding and returned demonstration  HOME EXERCISE PROGRAM: Access Code: [email or web address] URL: [email or web address] Date: 03/31/2024 Prepared by: Suzzane Estes  Exercises - Seated Cervical Retraction  - 3 x daily - 7 x weekly - 3 sets - 10 reps - 5 seconds hold - Seated Scapular Retraction  - 3 x daily - 7 x weekly - 3 sets - 10 reps - 5 seconds hold - Supine Head Nod with Deep Neck Flexor Activation  - 1 x daily - 7 x weekly - 3 sets - 10 reps - 5  seconds hold  - Isometric Tricep Extension   - 1 x daily - 7 x weekly - 3 sets - 10 reps - 5 seconds hold    ASSESSMENT:  CLINICAL IMPRESSION: Continued promoting posterior and inferior glide of L humeral head via muscle use, as well as promoting scapular strengthening and decreasing upper trap muscle tension to shoulder and neck to promote better glenohumeral joint mechanics. Decreased L shoulder and arm pain as well as L neck discomfort after session reported. Pt will benefit from continued skilled physical therapy services to decrease pain, improve strength and function.           OBJECTIVE  IMPAIRMENTS: decreased ROM, decreased strength, impaired UE functional use, improper body mechanics, postural dysfunction, and pain.   ACTIVITY LIMITATIONS: carrying, lifting, and reach over head  PARTICIPATION LIMITATIONS:   PERSONAL FACTORS: Age, Fitness, and 1 comorbidity: HTN, hx of CA  are also affecting patient's functional outcome.   REHAB POTENTIAL: Fair    CLINICAL DECISION MAKING: Stable/uncomplicated pain has improved since onset  EVALUATION COMPLEXITY: Low   GOALS: Goals reviewed with patient? Yes  SHORT TERM GOALS: Target date: 04/14/2024  Pt will be independent with her initial HEP to decrease L shoulder and neck pain, improve strength, function, and ability to reach and turn her head to the L more comfortably.  Baseline: Pt has started her initial HEP (03/31/2024) Goal status: INITIAL    LONG TERM GOALS: Target date: 05/26/2024  Pt will have a decrease in L arm pain to 4/10 or less at worst to promote ability to lift as well as reach more comfortably. Baseline: at least 7/10 at worst for the past 3 months (4/11/20025) Goal status: INITIAL  2.  Pt will improve her L lower trap and ER muscle strength by at least 1/2 MMT grade to promote ability to reach with her L arm more comfortably.  Baseline:  MMT Right eval Left eval  Shoulder external rotation  4 with L lateral deltoid pain  Lower trapezius (upright manually resisted) 4 4-   (03/31/2024)  Goal status: INITIAL  3.  Pt will improve her L cervical AROM to 60 degrees without pain to promote ability to look around more comfortably.  Baseline:  Active ROM A/PROM (deg) eval  Right rotation 80  Left rotation 35 degrees with neck pain at L upper trap/C5 dermatome area, and L lateral neck muscle   (03/31/2024)  Goal status: INITIAL  4.  Patient will improve her UE functional Index (UEFI) sore by at least 8 points as a demonstration of improved function Baseline: UEFI score: 48/80 (03/31/2024) Goal status:  INITIAL   PLAN:  PT FREQUENCY: 1-2x/week  PT DURATION: 8 weeks  PLANNED INTERVENTIONS: 97110-Therapeutic exercises, 97530- Therapeutic activity, [id number]- Neuromuscular re-education, 97535- Self Care, 16109- Manual therapy, [id number]- Electrical stimulation (  unattended), [phone]- Ionotophoresis 4mg /ml Dexamethasone , and Patient/Family education  PLAN FOR NEXT SESSION: posture, cervical retraction, scapular strengthening, thoracic extension, glenohumeral mechanics, ER muscle strengthening, manual techniques, modalities PRN   Marialuiza Car, PT, DPT 04/13/2024, 12:14 PM

## 2024-04-14 ENCOUNTER — Ambulatory Visit: Payer: Self-pay

## 2024-04-14 ENCOUNTER — Ambulatory Visit

## 2024-04-14 VITALS — BP 120/64 | HR 77 | Temp 98.3°F | Ht 60.0 in | Wt 126.0 lb

## 2024-04-14 DIAGNOSIS — N3090 Cystitis, unspecified without hematuria: Secondary | ICD-10-CM

## 2024-04-14 DIAGNOSIS — R350 Frequency of micturition: Secondary | ICD-10-CM | POA: Diagnosis not present

## 2024-04-14 LAB — POC URINALSYSI DIPSTICK (AUTOMATED)
Bilirubin, UA: NEGATIVE
Glucose, UA: NEGATIVE
Ketones, UA: NEGATIVE
Nitrite, UA: NEGATIVE
Protein, UA: POSITIVE — AB
Spec Grav, UA: 1.02 (ref 1.010–1.025)
Urobilinogen, UA: 0.2 U/dL
pH, UA: 7 (ref 5.0–8.0)

## 2024-04-14 LAB — URINALYSIS, ROUTINE W REFLEX MICROSCOPIC
Bilirubin Urine: NEGATIVE
Ketones, ur: NEGATIVE
Nitrite: NEGATIVE
Specific Gravity, Urine: <=1.005 — AB (ref 1.000–1.030)
Urine Glucose: NEGATIVE
Urobilinogen, UA: 0.2 (ref 0.0–1.0)
pH: 6 (ref 5.0–8.0)

## 2024-04-14 MED ORDER — NITROFURANTOIN MONOHYD MACRO 100 MG PO CAPS: 100.0000 mg | ORAL_CAPSULE | Freq: Two times a day (BID) | ORAL | 0 refills | Status: AC

## 2024-04-14 MED ORDER — NITROFURANTOIN MONOHYD MACRO 100 MG PO CAPS: 100.0000 mg | ORAL_CAPSULE | Freq: Two times a day (BID) | ORAL | Status: DC

## 2024-04-14 NOTE — Telephone Encounter (Signed)
 NOTED

## 2024-04-14 NOTE — Progress Notes (Signed)
 Acute Office Visit  Subjective:    Patient ID: Jamie Elliott, female    DOB: 1945-12-26, 78 y.o.   MRN: [id number]  Chief Complaint  Patient presents with   Urinary Frequency   Abdominal Pain   HPI Patient is in today for following acute concern:  Urinary frequency, urgency for 2 days. Patient reports she has been needing to wake up multiple times at night to urinate. She is also reporting of suprapubic fullness for 2 days. She denies fever, chills, nausea, vomiting, flank/back pain, hematuria, vaginal discharge.   Patient has a h/o E. Coli UTI in 05/03/2023. Was treated with Macrobid  at the time with patient reporting of complete resolution of her symptoms at that time.   Patient lives with her husband and her son. She does not drink alcohol.  ROS As per HPI    Objective:    BP 120/64   Pulse 77   Temp 98.3 F (36.8 C) (Oral)   Ht 5' (1.524 m)   Wt 126 lb (57.2 kg)   LMP 11/28/1998   SpO2 96%   BMI 24.61 kg/m    Physical Exam HENT:     Head: Normocephalic and atraumatic.     Mouth/Throat:     Mouth: Mucous membranes are moist.  Cardiovascular:     Rate and Rhythm: Normal rate.  Pulmonary:     Effort: Pulmonary effort is normal.  Abdominal:     General: Bowel sounds are normal.     Palpations: Abdomen is soft.     Tenderness: There is no abdominal tenderness. There is no right CVA tenderness, left CVA tenderness or guarding.  Lymphadenopathy:     Cervical: No cervical adenopathy.  Skin:    General: Skin is warm.  Neurological:     Mental Status: She is alert and oriented to person, place, and time.     Following lab results were reviewed personally:   Component     Latest Ref Rng 05/03/2023  Glucose, UA     NEGATIVE mg/dL NEGATIVE   WBC, UA     0 - 5 WBC/hpf 21-50   Bacteria, UA     NONE SEEN  MANY !   Color, Urine     YELLOW  YELLOW   Appearance     CLEAR  HAZY !   Specific Gravity, Urine     1.005 - 1.030  1.015   pH     5.0 - 8.0  6.5    Hgb urine dipstick     NEGATIVE  TRACE !   Bilirubin Urine     NEGATIVE  NEGATIVE   Ketones, ur     NEGATIVE mg/dL NEGATIVE   Protein     NEGATIVE mg/dL NEGATIVE   Nitrite     NEGATIVE  POSITIVE !   Leukocytes,Ua     NEGATIVE  SMALL !   Squamous Epithelial / HPF     0 - 5 /HPF 0-5   RBC / HPF     0 - 5 RBC/hpf 0-5      05/03/2023  Organism ID, Bacteria ESCHERICHIA COLI !   Specimen Description URINE, CLEAN CATCH.   Special Requests NONE.   Culture >=100,000 COLONIES/mL ESCHERICHIA COLI !   Report Status 05/05/2023 FINAL     Component     Latest Ref Rng 03/24/2024  Sodium     135 - 145 mEq/L 141   Potassium     3.5 - 5.1 mEq/L 3.7   Chloride  96 - 112 mEq/L 105   CO2     19 - 32 mEq/L 29   Glucose     70 - 99 mg/dL 89   BUN     6 - 23 mg/dL 19   Creatinine     1.61 - 1.20 mg/dL 0.96   Calcium      8.4 - 10.5 mg/dL 04.5   GFR     >40.98 mL/min 75.19     Results for orders placed or performed in visit on 04/14/24  POCT Urinalysis Dipstick (Automated)  Result Value Ref Range   Color, UA yellow    Clarity, UA clear    Glucose, UA Negative Negative   Bilirubin, UA negative    Ketones, UA negative    Spec Grav, UA 1.020 1.010 - 1.025   Blood, UA moderate    pH, UA 7.0 5.0 - 8.0   Protein, UA Positive (A) Negative   Urobilinogen, UA 0.2 0.2 or 1.0 E.U./dL   Nitrite, UA negative    Leukocytes, UA Large (3+) (A) Negative       Assessment & Plan:  Pleasant 78 year old female with h/o E. Coli UTI in the past presenting for evaluation of urinary frequency, urgency for 2 days.   Cystitis Assessment & Plan: Reviewed urine dipstick which shows large leukocyte esterase. Recommend UA micro, urine culture and sensitivity. Given her symptoms, there is high suspicion for uncomplicated UTI. I reviewed normal BMP from 03/24/24. Recommend starting Nitrofurantoin /Macrobid  100 mg BID for 7 days. S/E discussed with the patient, recommend taking this with food for better  absorption. Recommend taking daily OTC probiotic to reduce antibiotic side effects including vaginal yeast infection, gi upset. If symptoms acutely worsens (eg- fever, chills, nausea, vomiting) emergent medical evaluation recommended.  If symptoms improves recommend f/u with PCP as scheduled.   Orders: -     POCT Urinalysis Dipstick (Automated) -     Urinalysis, Routine w reflex microscopic -     Urine Culture -     Nitrofurantoin  Monohyd Macro; Take 1 capsule (100 mg total) by mouth 2 (two) times daily for 7 days.  Dispense: 14 capsule; Refill: 0  Urinary frequency -     POCT Urinalysis Dipstick (Automated) -     Urinalysis, Routine w reflex microscopic -     Urine Culture    Return if symptoms worsen or fail to improve.  Jacklin Mascot, MD

## 2024-04-14 NOTE — Assessment & Plan Note (Signed)
 Reviewed urine dipstick which shows large leukocyte esterase. Recommend UA micro, urine culture and sensitivity. Given her symptoms, there is high suspicion for uncomplicated UTI. I reviewed normal BMP from 03/24/24. Recommend starting Nitrofurantoin /Macrobid  100 mg BID for 7 days. S/E discussed with the patient, recommend taking this with food for better absorption. Recommend taking daily OTC probiotic to reduce antibiotic side effects including vaginal yeast infection, gi upset. If symptoms acutely worsens (eg- fever, chills, nausea, vomiting) emergent medical evaluation recommended.  If symptoms improves recommend f/u with PCP as scheduled.

## 2024-04-14 NOTE — Progress Notes (Signed)
 Noted. Discussed result during OV. Recommend urine culture.  Jacklin Mascot, MD

## 2024-04-14 NOTE — Patient Instructions (Signed)
 1) Start Macrobid  100 mg, twice a day for 7 days. Take this with food to help reduce upset stomach. Take daily probiotic to reduce risk of vaginal yeast infection, diarrhea.   2) If you start to have fever, chills, vomiting, lower back pain you should probably go to the ER.   3) Stay hydrated. If your culture result shows we need to change antibiotic we will reach out to you.   4) Follow up with you PCP if your symptoms does not improve with the antibiotic.

## 2024-04-14 NOTE — Progress Notes (Signed)
 Reviewed during OV.   Jacklin Mascot, MD

## 2024-04-14 NOTE — Telephone Encounter (Signed)
  Chief Complaint: dysuria Symptoms: lower abdominal pain, urinary urgency, urinary frequency, burning with urination Frequency: x3 days, worsened last night Pertinent Negatives: Patient denies back/flank pain, nausea, vomiting Disposition: [] ED /[] Urgent Care (no appt availability in office) / [x] Appointment(In office/virtual)/ []  [facility]/ [] Home Care/ [] Refused Recommended Disposition /[] [facility]/ []  Follow-up with PCP Additional Notes: Patient states she called her urologist yesterday and she meant to call PCP yesterday but states she was busy. Patient agreeable to acute visit with Dr Casimir Cleaver this morning as there are no available appts with PCP.  Copied from CRM [id number]. Topic: Clinical - Red Word Triage >> Apr 14, 2024  7:37 AM [place] A wrote: [facility] that prompted transfer to Nurse Triage: Patient is having possible UTI-having lower abdomin pain, urgency and burning Reason for Disposition  Age > 50 years  Answer Assessment - Initial Assessment Questions 1. SEVERITY: "How bad is the pain?"  (e.g., Scale 1-10; mild, moderate, or severe)   - MILD (1-3): complains slightly about urination hurting   - MODERATE (4-7): interferes with normal activities     - SEVERE (8-10): excruciating, unwilling or unable to urinate because of the pain      She states severe since last night.  2. FREQUENCY: "How many times have you had painful urination today?"      4-5 times.  3. PATTERN: "Is pain present every time you urinate or just sometimes?"      Every time.  4. ONSET: "When did the painful urination start?"      2 days ago.  5. FEVER: "Do you have a fever?" If Yes, ask: "What is your temperature, how was it measured, and when did it start?"     She states she feels like one is developing.  6. PAST UTI: "Have you had a urine infection before?" If Yes, ask: "When was the last time?" and "What happened that time?"      Yes, she states it has been a long time since  the last one. Treated with antibiotics.  7. CAUSE: "What do you think is causing the painful urination?"  (e.g., UTI, scratch, Herpes sore)     UTI.  8. OTHER SYMPTOMS: "Do you have any other symptoms?" (e.g., blood in urine, flank pain, genital sores, urgency, vaginal discharge)     Lower abdominal pain, urgency, frequency.  9. PREGNANCY: "Is there any chance you are pregnant?" "When was your last menstrual period?"     N/A.  Protocols used: Urination Pain - Female-A-AH

## 2024-04-16 LAB — URINE CULTURE
MICRO NUMBER:: 16376624
SPECIMEN QUALITY:: ADEQUATE

## 2024-04-17 ENCOUNTER — Ambulatory Visit

## 2024-04-18 ENCOUNTER — Ambulatory Visit

## 2024-04-19 ENCOUNTER — Ambulatory Visit

## 2024-04-19 DIAGNOSIS — M25512 Pain in left shoulder: Secondary | ICD-10-CM

## 2024-04-19 DIAGNOSIS — M79602 Pain in left arm: Secondary | ICD-10-CM

## 2024-04-19 DIAGNOSIS — M542 Cervicalgia: Secondary | ICD-10-CM | POA: Diagnosis not present

## 2024-04-19 NOTE — Therapy (Signed)
 OUTPATIENT PHYSICAL THERAPY TREATMENT   Patient Name: Jamie Elliott MRN: [id number] DOB:[birth year], 78 y.o., female Today's Date: 04/19/2024  END OF SESSION:  PT End of Session - 04/19/24 1427     Visit Number 4    Number of Visits 17    Date for PT Re-Evaluation 05/26/24    PT Start Time 1429    PT Stop Time 1510    PT Time Calculation (min) 41 min    Activity Tolerance Patient tolerated treatment well    Behavior During Therapy [facility] for tasks assessed/performed                Past Medical History:  Diagnosis Date   Allergy 1970's.   Blood transfusion without reported diagnosis 1983   Cancer (HCC) 02-2023   Cataract 2023   Environmental allergies    Hypercholesterolemia    Hypertension    Migraines    Past Surgical History:  Procedure Laterality Date   ABDOMINAL HYSTERECTOMY  12/21/1998   APPENDECTOMY  12/21/1998   BREAST SURGERY  05/2023   at [facility] lumpectomy   CATARACT EXTRACTION W/PHACO Left 04/23/2021   Procedure: CATARACT EXTRACTION PHACO AND INTRAOCULAR LENS PLACEMENT (IOC) LEFT panoptix toric 10.41 01:22.8 12.6%;  Surgeon: Annell Kidney, MD;  Location: [facility];  Service: Ophthalmology;  Laterality: Left;   CATARACT EXTRACTION W/PHACO Right 05/07/2021   Procedure: CATARACT EXTRACTION PHACO AND INTRAOCULAR LENS PLACEMENT (IOC) RIGHT  panoptix 12.85 01:33.8 13.7%;  Surgeon: Annell Kidney, MD;  Location: [facility];  Service: Ophthalmology;  Laterality: Right;   NOSE SURGERY  12/21/1965   SEPTOPLASTY     SHOULDER ARTHROSCOPY WITH OPEN ROTATOR CUFF REPAIR Right 12/05/2015   Procedure: SHOULDER ARTHROSCOPY WITH MINI OPEN ROTATOR CUFF REPAIR. DISTAL CLAVICLE EXCISION;  Surgeon: Rande Bushy, MD;  Location: [facility] ORS;  Service: Orthopedics;  Laterality: Right;   Patient Active Problem List   Diagnosis Date Noted   Cystitis 04/14/2024   Urinary frequency 04/14/2024   Neck pain 04/02/2024   Skin mole 04/02/2024   Toe pain  04/02/2024   Sprain of left upper arm 03/01/2024   Rash 12/12/2023   Genetic testing 05/24/2023   Carcinoma of upper-outer quadrant of left breast in female, estrogen receptor positive (HCC) 03/22/2023   Viral URI 03/15/2023   Headache 01/14/2023   Glossitis 05/02/2022   Aphthous ulcer 04/11/2022   Leukocytosis 04/07/2022   Viral syndrome 03/28/2022   Respiratory tract infection 03/17/2022   Nausea and vomiting 03/17/2022   Vitamin D  deficiency 12/11/2021   Mouth sores 10/19/2021   Abnormal mammogram 06/26/2021   History of COVID-19 06/18/2021   Bleeding 03/15/2021   Left foot pain 03/04/2021   Fatigue 01/05/2021   Angioleiomyoma 11/18/2020   Nasal drainage 08/12/2020   Stress 04/22/2020   Sinus congestion 11/08/2019   Allergy 07/02/2019   Low back pain 12/11/2018   Left arm pain 07/24/2018   Right hip pain 05/24/2018   Left leg swelling 09/03/2017   Chest pressure 06/16/2017   Altered sensation of foot 06/16/2017   Recurrent UTI 05/12/2017   Constipation 02/16/2016   UTI (urinary tract infection) 01/06/2016   Pain in joint, shoulder region 12/05/2015   Loss of weight 11/25/2015   History of abnormal mammogram 08/26/2015   Health care maintenance 05/19/2015   Osteoporosis 04/17/2014   Abnormal liver function tests 12/17/2013   HTN (hypertension) 12/04/2012   Hypercholesterolemia 12/04/2012   Environmental allergies 12/04/2012    PCP: Dellar Fenton, MD  REFERRING PROVIDER: Dellar Fenton, MD  REFERRING DIAG: M79.602 (ICD-10-CM) - Left arm pain  THERAPY DIAG:  Acute pain of left shoulder  Pain in left arm  Cervicalgia  Rationale for Evaluation and Treatment: Rehabilitation  ONSET DATE: 3 weeks ago from 03/31/2024  SUBJECTIVE:                                                                                                                                                                                                         SUBJECTIVE STATEMENT:    Patient reports some soreness along the L biceps. It was really hurting the day before but is better today.   Hand dominance: Left  PERTINENT HISTORY:   L arm pain. Pain began at least 3 weeks ago. Pt lifted a heavy teapot of water and felt a pop in her L shoulder. Pain is a lot better since onset. Pt is also taking exercises for her L UE 3x/week at night. Pt has also had a crick in her neck for more than 3 days which has been bothering her more. Pt was told ehtat her arm is a sprain. Pt also had tingling B hand and arm (along the C5-C6 dermatome)   Blood pressure is controlled.  No latex allergies  PAIN:  Are you having pain? Yes: NPRS scale: 7/10 Pain location: L anterior shoulder and arm Pain description: unable to describe Aggravating factors: supination, pronation, raising her arm Relieving factors: rest  PRECAUTIONS: Osteoporosis, Hx of CA  RED FLAGS: None     WEIGHT BEARING RESTRICTIONS: No  FALLS:  Has patient fallen in last 6 months? No  LIVING ENVIRONMENT: Lives with: lives with their spouse   OCCUPATION: retired  PLOF: Independent  PATIENT GOALS: TP be able to do her exercises without worrying about it.   NEXT MD VISIT: yes (pt does not know)  OBJECTIVE:  Note: Objective measures were completed at Evaluation unless otherwise noted.  DIAGNOSTIC FINDINGS:     PATIENT SURVEYS:  UEFI score: 48/80 (03/31/2024)  COGNITION: Overall cognitive status: Within functional limits for tasks assessed  SENSATION:   POSTURE: forward neck, B protracted shoulders, L lateral shift neck, kyphosis, L scapular protraction > R   PALPATION:    CERVICAL ROM:   Active ROM A/PROM (deg) eval  Flexion full  Extension Full with L posterior lateral neck pain  Right lateral flexion Limited with L posterior lateral neck pull  Left lateral flexion Very limited with R posterior lateral neck pull  Right rotation 80  Left rotation 35 degrees with neck pain at L upper  trap/C5 dermatome area, and  L lateral neck muscle   (Blank rows = not tested)  UPPER EXTREMITY ROM:  Active ROM Right eval Left eval  Shoulder flexion 161 156 (175 AAROM)  Shoulder extension    Shoulder abduction 175 177  Shoulder adduction    Shoulder extension    Shoulder internal rotation (functional) R index finger to L inferior scapular angle Full no pain  Shoulder external rotation (functional) full Full, slight pain  Elbow flexion    Elbow extension    Wrist flexion    Wrist extension    Wrist ulnar deviation    Wrist radial deviation    Wrist pronation    Wrist supination     (Blank rows = not tested)  UPPER EXTREMITY MMT:  MMT Right eval Left eval  Shoulder flexion  4+  Shoulder extension    Shoulder abduction  4+ (able to perform with proper scapular position)  Shoulder adduction    Shoulder extension    Shoulder internal rotation  4+  Shoulder external rotation  4 with L lateral deltoid pain  Middle trapezius    Lower trapezius (upright manually resisted) 4 4-  Elbow flexion  4+  Elbow extension  4  Wrist flexion    Wrist extension    Wrist ulnar deviation    Wrist radial deviation    Wrist pronation    Wrist supination    Grip strength     (Blank rows = not tested)  CERVICAL SPECIAL TESTS:    FUNCTIONAL TESTS:  Decreased pain with return from L shoulder abduction with addition of scapular retraction.    Decreased pain with L cervical rotation with proper cervical posture (chin tuck)      (+) Hawkins-kennedy L shoulder   (+) empty can test     No L biceps pain with isometric, concentric and eccentric contraction  TREATMENT DATE: 04/19/24                                                                                                                                Therapeutic exercise  Standing L shoulder isometrics flexion/abduction/IR/ER x 10 with 3 second hold each direction   -provided HEP handout, see below  Supine PROM with GHJ  distraction x 10 minutes - flexion/abduction/ER  Supine L shoulder flexion 2# DB 2 x 10  Seated rows with RTB 2 x 10   Standing reaching activity to 3rd shelf (reaching overhead) with cones, 1# DB, and 2# DB    Improved exercise technique, movement at target joints, use of target muscles after mod verbal, visual, tactile cues.    PATIENT EDUCATION:  Education details: there-ex, HEP, POC Person educated: Patient Education method: Explanation, Demonstration, Tactile cues, Verbal cues, and Handouts Education comprehension: verbalized understanding and returned demonstration  HOME EXERCISE PROGRAM: Access Code: [email or web address] URL: [email or web address] Date: 03/31/2024 Prepared by: Suzzane Estes  Exercises - Seated Cervical Retraction  - 3 x daily - 7 x weekly - 3  sets - 10 reps - 5 seconds hold - Seated Scapular Retraction  - 3 x daily - 7 x weekly - 3 sets - 10 reps - 5 seconds hold - Supine Head Nod with Deep Neck Flexor Activation  - 1 x daily - 7 x weekly - 3 sets - 10 reps - 5  seconds hold  - Isometric Tricep Extension   - 1 x daily - 7 x weekly - 3 sets - 10 reps - 5 seconds hold  Access Code: [email or web address] URL: [email or web address] Date: 04/19/2024 Prepared by: Janine Melbourne  Exercises - Standing Isometric Shoulder Flexion with Doorway - Arm Bent  - 1-2 x daily - 3-5 x weekly - 2 sets - 10 reps - Standing Isometric Shoulder Abduction with Doorway - Arm Bent  - 1-2 x daily - 3-5 x weekly - 2 sets - 10 reps - Standing Isometric Shoulder Internal Rotation at Doorway  - 1-2 x daily - 3-5 x weekly - 2 sets - 10 reps - Standing Isometric Shoulder External Rotation with Doorway  - 1-2 x daily - 3-5 x weekly - 2 sets - 10 reps  ASSESSMENT:  CLINICAL IMPRESSION:   Patient arrives to treatment session motivated to participate with complaints of soreness but no pain in L shoulder. Session focused on L shoulder isometrics and gentle strengthening along with  functional activity of reaching overhead. Tolerated session well with no increase in pain. Pt will benefit from continued skilled physical therapy services to decrease pain, improve strength and function.   OBJECTIVE IMPAIRMENTS: decreased ROM, decreased strength, impaired UE functional use, improper body mechanics, postural dysfunction, and pain.   ACTIVITY LIMITATIONS: carrying, lifting, and reach over head  PARTICIPATION LIMITATIONS:   PERSONAL FACTORS: Age, Fitness, and 1 comorbidity: HTN, hx of CA  are also affecting patient's functional outcome.   REHAB POTENTIAL: Fair    CLINICAL DECISION MAKING: Stable/uncomplicated pain has improved since onset  EVALUATION COMPLEXITY: Low   GOALS: Goals reviewed with patient? Yes  SHORT TERM GOALS: Target date: 04/14/2024  Pt will be independent with her initial HEP to decrease L shoulder and neck pain, improve strength, function, and ability to reach and turn her head to the L more comfortably.  Baseline: Pt has started her initial HEP (03/31/2024) Goal status: INITIAL    LONG TERM GOALS: Target date: 05/26/2024  Pt will have a decrease in L arm pain to 4/10 or less at worst to promote ability to lift as well as reach more comfortably. Baseline: at least 7/10 at worst for the past 3 months (4/11/20025) Goal status: INITIAL  2.  Pt will improve her L lower trap and ER muscle strength by at least 1/2 MMT grade to promote ability to reach with her L arm more comfortably.  Baseline:  MMT Right eval Left eval  Shoulder external rotation  4 with L lateral deltoid pain  Lower trapezius (upright manually resisted) 4 4-   (03/31/2024)  Goal status: INITIAL  3.  Pt will improve her L cervical AROM to 60 degrees without pain to promote ability to look around more comfortably.  Baseline:  Active ROM A/PROM (deg) eval  Right rotation 80  Left rotation 35 degrees with neck pain at L upper trap/C5 dermatome area, and L lateral neck muscle    (03/31/2024)  Goal status: INITIAL  4.  Patient will improve her UE functional Index (UEFI) sore by at least 8 points as a demonstration of improved function Baseline: UEFI score: 48/80 (03/31/2024) Goal  status: INITIAL   PLAN:  PT FREQUENCY: 1-2x/week  PT DURATION: 8 weeks  PLANNED INTERVENTIONS: 97110-Therapeutic exercises, 97530- Therapeutic activity, [id number]- Neuromuscular re-education, 97535- Self Care, [id number]- Manual therapy, [id number]- Electrical stimulation (unattended), [phone]- Ionotophoresis 4mg /ml Dexamethasone , and Patient/Family education  PLAN FOR NEXT SESSION: posture, cervical retraction, scapular strengthening, thoracic extension, glenohumeral mechanics, ER muscle strengthening, manual techniques, modalities PRN   Remmie Bembenek A Jamaine Quintin, PT, DPT 04/19/2024, 2:27 PM

## 2024-04-24 ENCOUNTER — Ambulatory Visit

## 2024-04-26 ENCOUNTER — Ambulatory Visit: Attending: Internal Medicine

## 2024-04-26 DIAGNOSIS — M79602 Pain in left arm: Secondary | ICD-10-CM | POA: Diagnosis not present

## 2024-04-26 DIAGNOSIS — M25512 Pain in left shoulder: Secondary | ICD-10-CM | POA: Insufficient documentation

## 2024-04-26 DIAGNOSIS — M542 Cervicalgia: Secondary | ICD-10-CM | POA: Diagnosis not present

## 2024-04-26 NOTE — Therapy (Signed)
 OUTPATIENT PHYSICAL THERAPY TREATMENT   Patient Name: Jamie Elliott MRN: [id number] DOB:09-Feb-1946, 78 y.o., female Today's Date: 04/26/2024  END OF SESSION:  PT End of Session - 04/26/24 0735     Visit Number 5    Number of Visits 17    Date for PT Re-Evaluation 05/26/24    PT Start Time 0735    PT Stop Time 0813    PT Time Calculation (min) 38 min    Activity Tolerance Patient tolerated treatment well    Behavior During Therapy [facility] for tasks assessed/performed                 Past Medical History:  Diagnosis Date   Allergy 1970's.   Blood transfusion without reported diagnosis 1983   Cancer (HCC) 02-2023   Cataract 2023   Environmental allergies    Hypercholesterolemia    Hypertension    Migraines    Past Surgical History:  Procedure Laterality Date   ABDOMINAL HYSTERECTOMY  12/21/1998   APPENDECTOMY  12/21/1998   BREAST SURGERY  05/2023   at [facility] lumpectomy   CATARACT EXTRACTION W/PHACO Left 04/23/2021   Procedure: CATARACT EXTRACTION PHACO AND INTRAOCULAR LENS PLACEMENT (IOC) LEFT panoptix toric 10.41 01:22.8 12.6%;  Surgeon: Annell Kidney, MD;  Location: [facility];  Service: Ophthalmology;  Laterality: Left;   CATARACT EXTRACTION W/PHACO Right 05/07/2021   Procedure: CATARACT EXTRACTION PHACO AND INTRAOCULAR LENS PLACEMENT (IOC) RIGHT  panoptix 12.85 01:33.8 13.7%;  Surgeon: Annell Kidney, MD;  Location: [facility];  Service: Ophthalmology;  Laterality: Right;   NOSE SURGERY  12/21/1965   SEPTOPLASTY     SHOULDER ARTHROSCOPY WITH OPEN ROTATOR CUFF REPAIR Right 12/05/2015   Procedure: SHOULDER ARTHROSCOPY WITH MINI OPEN ROTATOR CUFF REPAIR. DISTAL CLAVICLE EXCISION;  Surgeon: Rande Bushy, MD;  Location: [facility];  Service: Orthopedics;  Laterality: Right;   Patient Active Problem List   Diagnosis Date Noted   Cystitis 04/14/2024   Urinary frequency 04/14/2024   Neck pain 04/02/2024   Skin mole 04/02/2024   Toe pain  04/02/2024   Sprain of left upper arm 03/01/2024   Rash 12/12/2023   Genetic testing 05/24/2023   Carcinoma of upper-outer quadrant of left breast in female, estrogen receptor positive (HCC) 03/22/2023   Viral URI 03/15/2023   Headache 01/14/2023   Glossitis 05/02/2022   Aphthous ulcer 04/11/2022   Leukocytosis 04/07/2022   Viral syndrome 03/28/2022   Respiratory tract infection 03/17/2022   Nausea and vomiting 03/17/2022   Vitamin D  deficiency 12/11/2021   Mouth sores 10/19/2021   Abnormal mammogram 06/26/2021   History of COVID-19 06/18/2021   Bleeding 03/15/2021   Left foot pain 03/04/2021   Fatigue 01/05/2021   Angioleiomyoma 11/18/2020   Nasal drainage 08/12/2020   Stress 04/22/2020   Sinus congestion 11/08/2019   Allergy 07/02/2019   Low back pain 12/11/2018   Left arm pain 07/24/2018   Right hip pain 05/24/2018   Left leg swelling 09/03/2017   Chest pressure 06/16/2017   Altered sensation of foot 06/16/2017   Recurrent UTI 05/12/2017   Constipation 02/16/2016   UTI (urinary tract infection) 01/06/2016   Pain in joint, shoulder region 12/05/2015   Loss of weight 11/25/2015   History of abnormal mammogram 08/26/2015   Health care maintenance 05/19/2015   Osteoporosis 04/17/2014   Abnormal liver function tests 12/17/2013   HTN (hypertension) 12/04/2012   Hypercholesterolemia 12/04/2012   Environmental allergies 12/04/2012    PCP: Dellar Fenton, MD  REFERRING PROVIDER: Dellar Fenton,  MD  REFERRING DIAG: Z30.865 (ICD-10-CM) - Left arm pain  THERAPY DIAG:  Acute pain of left shoulder  Pain in left arm  Cervicalgia  Rationale for Evaluation and Treatment: Rehabilitation  ONSET DATE: 3 weeks ago from 03/31/2024  SUBJECTIVE:                                                                                                                                                                                                         SUBJECTIVE STATEMENT:   Today  her L shoulder and arm is good, 2 days ago, it was not good. Some days, they ache and get sore.The kink in her neck goes a long with her arm, good today,     Hand dominance: Left  PERTINENT HISTORY:   L arm pain. Pain began at least 3 weeks ago. Pt lifted a heavy teapot of water and felt a pop in her L shoulder. Pain is a lot better since onset. Pt is also taking exercises for her L UE 3x/week at night. Pt has also had a crick in her neck for more than 3 days which has been bothering her more. Pt was told ehtat her arm is a sprain. Pt also had tingling B hand and arm (along the C5-C6 dermatome)   Blood pressure is controlled.  No latex allergies  PAIN:  Are you having pain? Yes: NPRS scale: 7/10 Pain location: L anterior shoulder and arm Pain description: unable to describe Aggravating factors: supination, pronation, raising her arm Relieving factors: rest  PRECAUTIONS: Osteoporosis, Hx of CA  RED FLAGS: None     WEIGHT BEARING RESTRICTIONS: No  FALLS:  Has patient fallen in last 6 months? No  LIVING ENVIRONMENT: Lives with: lives with their spouse   OCCUPATION: retired  PLOF: Independent  PATIENT GOALS: TP be able to do her exercises without worrying about it.   NEXT MD VISIT: yes (pt does not know)  OBJECTIVE:  Note: Objective measures were completed at Evaluation unless otherwise noted.  DIAGNOSTIC FINDINGS:     PATIENT SURVEYS:  UEFI score: 48/80 (03/31/2024)  COGNITION: Overall cognitive status: Within functional limits for tasks assessed  SENSATION:   POSTURE: forward neck, B protracted shoulders, L lateral shift neck, kyphosis, L scapular protraction > R   PALPATION:    CERVICAL ROM:   Active ROM A/PROM (deg) eval  Flexion full  Extension Full with L posterior lateral neck pain  Right lateral flexion Limited with L posterior lateral neck pull  Left lateral flexion Very limited with R posterior lateral neck pull  Right rotation 80  Left  rotation 35 degrees with neck pain at L upper trap/C5 dermatome area, and L lateral neck muscle   (Blank rows = not tested)  UPPER EXTREMITY ROM:  Active ROM Right eval Left eval  Shoulder flexion 161 156 (175 AAROM)  Shoulder extension    Shoulder abduction 175 177  Shoulder adduction    Shoulder extension    Shoulder internal rotation (functional) R index finger to L inferior scapular angle Full no pain  Shoulder external rotation (functional) full Full, slight pain  Elbow flexion    Elbow extension    Wrist flexion    Wrist extension    Wrist ulnar deviation    Wrist radial deviation    Wrist pronation    Wrist supination     (Blank rows = not tested)  UPPER EXTREMITY MMT:  MMT Right eval Left eval  Shoulder flexion  4+  Shoulder extension    Shoulder abduction  4+ (able to perform with proper scapular position)  Shoulder adduction    Shoulder extension    Shoulder internal rotation  4+  Shoulder external rotation  4 with L lateral deltoid pain  Middle trapezius    Lower trapezius (upright manually resisted) 4 4-  Elbow flexion  4+  Elbow extension  4  Wrist flexion    Wrist extension    Wrist ulnar deviation    Wrist radial deviation    Wrist pronation    Wrist supination    Grip strength     (Blank rows = not tested)  CERVICAL SPECIAL TESTS:    FUNCTIONAL TESTS:  Decreased pain with return from L shoulder abduction with addition of scapular retraction.    Decreased pain with L cervical rotation with proper cervical posture (chin tuck)      (+) Hawkins-kennedy L shoulder   (+) empty can test     No L biceps pain with isometric, concentric and eccentric contraction  TREATMENT DATE: 04/26/24                                                                                                                                Therapeutic exercise  Standing chin tuck with B scapular retraction against the wall, towel roll behind neck   10x5 seconds for 2  sets  Low back discomfort.  Seated B scapular retraction with   B scaption 10x3  Wall push ups with proper form (elbows in and pt stepping closer to the wall) 10x, no L shoulder and arm discomfort.   Seated rows with RTB 2 x 10 with 5 second holds   Seated B shoulder extension with scapular retraction   Red band 10x3 with 2-5 second holds  Seated L scapular depression isometrics resting her forearm against arm rest  10x5 seconds for 3 sets      Improved exercise technique, movement at target joints, use of target muscles after mod verbal, visual, tactile cues.  PATIENT EDUCATION:  Education details: there-ex, HEP, POC Person educated: Patient Education method: Explanation, Demonstration, Tactile cues, Verbal cues, and Handouts Education comprehension: verbalized understanding and returned demonstration  HOME EXERCISE PROGRAM: Access Code: [email or web address] URL: [email or web address] Date: 03/31/2024 Prepared by: Suzzane Estes  Exercises - Seated Cervical Retraction  - 3 x daily - 7 x weekly - 3 sets - 10 reps - 5 seconds hold - Seated Scapular Retraction  - 3 x daily - 7 x weekly - 3 sets - 10 reps - 5 seconds hold - Supine Head Nod with Deep Neck Flexor Activation  - 1 x daily - 7 x weekly - 3 sets - 10 reps - 5  seconds hold  - Isometric Tricep Extension   - 1 x daily - 7 x weekly - 3 sets - 10 reps - 5 seconds hold  Access Code: [email or web address] URL: [email or web address] Date: 04/19/2024 Prepared by: Janine Melbourne  Exercises - Standing Isometric Shoulder Flexion with Doorway - Arm Bent  - 1-2 x daily - 3-5 x weekly - 2 sets - 10 reps - Standing Isometric Shoulder Abduction with Doorway - Arm Bent  - 1-2 x daily - 3-5 x weekly - 2 sets - 10 reps - Standing Isometric Shoulder Internal Rotation at Doorway  - 1-2 x daily - 3-5 x weekly - 2 sets - 10 reps - Standing Isometric Shoulder External Rotation with Doorway  - 1-2 x daily - 3-5 x weekly - 2 sets  - 10 reps   ASSESSMENT:  CLINICAL IMPRESSION:   Pt overall improving with L shoulder and arm pain based on subjective reports. Continued working on promoting proper cervical and scapular posture, mechanics, and anterior cervical, and B scapular strengthening to decrease pressure to neck and L shoulder joint. Good muscle use reported with exercises. Pt tolerated session well without aggravation of symptoms.  Pt will benefit from continued skilled physical therapy services to decrease pain, improve strength and function.      OBJECTIVE IMPAIRMENTS: decreased ROM, decreased strength, impaired UE functional use, improper body mechanics, postural dysfunction, and pain.   ACTIVITY LIMITATIONS: carrying, lifting, and reach over head  PARTICIPATION LIMITATIONS:   PERSONAL FACTORS: Age, Fitness, and 1 comorbidity: HTN, hx of CA  are also affecting patient's functional outcome.   REHAB POTENTIAL: Fair    CLINICAL DECISION MAKING: Stable/uncomplicated pain has improved since onset  EVALUATION COMPLEXITY: Low   GOALS: Goals reviewed with patient? Yes  SHORT TERM GOALS: Target date: 04/14/2024  Pt will be independent with her initial HEP to decrease L shoulder and neck pain, improve strength, function, and ability to reach and turn her head to the L more comfortably.  Baseline: Pt has started her initial HEP (03/31/2024) Goal status: INITIAL    LONG TERM GOALS: Target date: 05/26/2024  Pt will have a decrease in L arm pain to 4/10 or less at worst to promote ability to lift as well as reach more comfortably. Baseline: at least 7/10 at worst for the past 3 months (4/11/20025) Goal status: INITIAL  2.  Pt will improve her L lower trap and ER muscle strength by at least 1/2 MMT grade to promote ability to reach with her L arm more comfortably.  Baseline:  MMT Right eval Left eval  Shoulder external rotation  4 with L lateral deltoid pain  Lower trapezius (upright manually resisted) 4 4-    (03/31/2024)  Goal status: INITIAL  3.  Pt will improve her L cervical AROM to 60 degrees without  pain to promote ability to look around more comfortably.  Baseline:  Active ROM A/PROM (deg) eval  Right rotation 80  Left rotation 35 degrees with neck pain at L upper trap/C5 dermatome area, and L lateral neck muscle   (03/31/2024)  Goal status: INITIAL  4.  Patient will improve her UE functional Index (UEFI) sore by at least 8 points as a demonstration of improved function Baseline: UEFI score: 48/80 (03/31/2024) Goal status: INITIAL   PLAN:  PT FREQUENCY: 1-2x/week  PT DURATION: 8 weeks  PLANNED INTERVENTIONS: 97110-Therapeutic exercises, 97530- Therapeutic activity, 97112- Neuromuscular re-education, 97535- Self Care, [id number]- Manual therapy, [id number]- Electrical stimulation (unattended), [phone]- Ionotophoresis 4mg /ml Dexamethasone , and Patient/Family education  PLAN FOR NEXT SESSION: posture, cervical retraction, scapular strengthening, thoracic extension, glenohumeral mechanics, ER muscle strengthening, manual techniques, modalities PRN   Tysheem Accardo, PT, DPT 04/26/2024, 8:14 AM

## 2024-05-02 ENCOUNTER — Ambulatory Visit

## 2024-05-03 ENCOUNTER — Ambulatory Visit

## 2024-05-03 DIAGNOSIS — M25512 Pain in left shoulder: Secondary | ICD-10-CM | POA: Diagnosis not present

## 2024-05-03 DIAGNOSIS — M542 Cervicalgia: Secondary | ICD-10-CM

## 2024-05-03 DIAGNOSIS — M79602 Pain in left arm: Secondary | ICD-10-CM

## 2024-05-03 NOTE — Therapy (Signed)
 OUTPATIENT PHYSICAL THERAPY TREATMENT   Patient Name: Jamie Elliott MRN: [id number] DOB:June 17, 1946, 78 y.o., female Today's Date: 05/03/2024  END OF SESSION:  PT End of Session - 05/03/24 1430     Visit Number 6    Number of Visits 17    Date for PT Re-Evaluation 05/26/24    PT Start Time 1434    PT Stop Time 1514    PT Time Calculation (min) 40 min    Activity Tolerance Patient tolerated treatment well    Behavior During Therapy [facility] for tasks assessed/performed                  Past Medical History:  Diagnosis Date   Allergy 1970's.   Blood transfusion without reported diagnosis 1983   Cancer (HCC) 02-2023   Cataract 2023   Environmental allergies    Hypercholesterolemia    Hypertension    Migraines    Past Surgical History:  Procedure Laterality Date   ABDOMINAL HYSTERECTOMY  12/21/1998   APPENDECTOMY  12/21/1998   BREAST SURGERY  05/2023   at [facility] lumpectomy   CATARACT EXTRACTION W/PHACO Left 04/23/2021   Procedure: CATARACT EXTRACTION PHACO AND INTRAOCULAR LENS PLACEMENT (IOC) LEFT panoptix toric 10.41 01:22.8 12.6%;  Surgeon: Annell Kidney, MD;  Location: [facility];  Service: Ophthalmology;  Laterality: Left;   CATARACT EXTRACTION W/PHACO Right 05/07/2021   Procedure: CATARACT EXTRACTION PHACO AND INTRAOCULAR LENS PLACEMENT (IOC) RIGHT  panoptix 12.85 01:33.8 13.7%;  Surgeon: Annell Kidney, MD;  Location: [facility];  Service: Ophthalmology;  Laterality: Right;   NOSE SURGERY  12/21/1965   SEPTOPLASTY     SHOULDER ARTHROSCOPY WITH OPEN ROTATOR CUFF REPAIR Right 12/05/2015   Procedure: SHOULDER ARTHROSCOPY WITH MINI OPEN ROTATOR CUFF REPAIR. DISTAL CLAVICLE EXCISION;  Surgeon: Rande Bushy, MD;  Location: [facility];  Service: Orthopedics;  Laterality: Right;   Patient Active Problem List   Diagnosis Date Noted   Cystitis 04/14/2024   Urinary frequency 04/14/2024   Neck pain 04/02/2024   Skin mole 04/02/2024   Toe pain  04/02/2024   Sprain of left upper arm 03/01/2024   Rash 12/12/2023   Genetic testing 05/24/2023   Carcinoma of upper-outer quadrant of left breast in female, estrogen receptor positive (HCC) 03/22/2023   Viral URI 03/15/2023   Headache 01/14/2023   Glossitis 05/02/2022   Aphthous ulcer 04/11/2022   Leukocytosis 04/07/2022   Viral syndrome 03/28/2022   Respiratory tract infection 03/17/2022   Nausea and vomiting 03/17/2022   Vitamin D  deficiency 12/11/2021   Mouth sores 10/19/2021   Abnormal mammogram 06/26/2021   History of COVID-19 06/18/2021   Bleeding 03/15/2021   Left foot pain 03/04/2021   Fatigue 01/05/2021   Angioleiomyoma 11/18/2020   Nasal drainage 08/12/2020   Stress 04/22/2020   Sinus congestion 11/08/2019   Allergy 07/02/2019   Low back pain 12/11/2018   Left arm pain 07/24/2018   Right hip pain 05/24/2018   Left leg swelling 09/03/2017   Chest pressure 06/16/2017   Altered sensation of foot 06/16/2017   Recurrent UTI 05/12/2017   Constipation 02/16/2016   UTI (urinary tract infection) 01/06/2016   Pain in joint, shoulder region 12/05/2015   Loss of weight 11/25/2015   History of abnormal mammogram 08/26/2015   Health care maintenance 05/19/2015   Osteoporosis 04/17/2014   Abnormal liver function tests 12/17/2013   HTN (hypertension) 12/04/2012   Hypercholesterolemia 12/04/2012   Environmental allergies 12/04/2012    PCP: Dellar Fenton, MD  REFERRING PROVIDER: Geralyn Knee,  [place], MD  REFERRING DIAG: N82.956 (ICD-10-CM) - Left arm pain  THERAPY DIAG:  Acute pain of left shoulder  Pain in left arm  Cervicalgia  Rationale for Evaluation and Treatment: Rehabilitation  ONSET DATE: 3 weeks ago from 03/31/2024  SUBJECTIVE:                                                                                                                                                                                                         SUBJECTIVE STATEMENT:   L  shoulder and L arm are doing good. Its been doing better. Pt also has a little rest from exercise (both PT and [facility] exercise). 2-3/10 L shoulder and arm pain currently. 3/10 L shoulder and arm pain at most for the past 7 days.      Hand dominance: Left  PERTINENT HISTORY:   L arm pain. Pain began at least 3 weeks ago. Pt lifted a heavy teapot of water and felt a pop in her L shoulder. Pain is a lot better since onset. Pt is also taking exercises for her L UE 3x/week at night. Pt has also had a crick in her neck for more than 3 days which has been bothering her more. Pt was told ehtat her arm is a sprain. Pt also had tingling B hand and arm (along the C5-C6 dermatome)   Blood pressure is controlled.  No latex allergies  PAIN:  Are you having pain? Yes: NPRS scale: 7/10 Pain location: L anterior shoulder and arm Pain description: unable to describe Aggravating factors: supination, pronation, raising her arm Relieving factors: rest  PRECAUTIONS: Osteoporosis, Hx of CA  RED FLAGS: None     WEIGHT BEARING RESTRICTIONS: No  FALLS:  Has patient fallen in last 6 months? No  LIVING ENVIRONMENT: Lives with: lives with their spouse   OCCUPATION: retired  PLOF: Independent  PATIENT GOALS: TP be able to do her exercises without worrying about it.   NEXT MD VISIT: yes (pt does not know)  OBJECTIVE:  Note: Objective measures were completed at Evaluation unless otherwise noted.  DIAGNOSTIC FINDINGS:     PATIENT SURVEYS:  UEFI score: 48/80 (03/31/2024)  COGNITION: Overall cognitive status: Within functional limits for tasks assessed  SENSATION:   POSTURE: forward neck, B protracted shoulders, L lateral shift neck, kyphosis, L scapular protraction > R   PALPATION:    CERVICAL ROM:   Active ROM A/PROM (deg) eval  Flexion full  Extension Full with L posterior lateral neck pain  Right lateral flexion Limited with L posterior lateral neck  pull  Left lateral  flexion Very limited with R posterior lateral neck pull  Right rotation 80  Left rotation 35 degrees with neck pain at L upper trap/C5 dermatome area, and L lateral neck muscle   (Blank rows = not tested)  UPPER EXTREMITY ROM:  Active ROM Right eval Left eval  Shoulder flexion 161 156 (175 AAROM)  Shoulder extension    Shoulder abduction 175 177  Shoulder adduction    Shoulder extension    Shoulder internal rotation (functional) R index finger to L inferior scapular angle Full no pain  Shoulder external rotation (functional) full Full, slight pain  Elbow flexion    Elbow extension    Wrist flexion    Wrist extension    Wrist ulnar deviation    Wrist radial deviation    Wrist pronation    Wrist supination     (Blank rows = not tested)  UPPER EXTREMITY MMT:  MMT Right eval Left eval  Shoulder flexion  4+  Shoulder extension    Shoulder abduction  4+ (able to perform with proper scapular position)  Shoulder adduction    Shoulder extension    Shoulder internal rotation  4+  Shoulder external rotation  4 with L lateral deltoid pain  Middle trapezius    Lower trapezius (upright manually resisted) 4 4-  Elbow flexion  4+  Elbow extension  4  Wrist flexion    Wrist extension    Wrist ulnar deviation    Wrist radial deviation    Wrist pronation    Wrist supination    Grip strength     (Blank rows = not tested)  CERVICAL SPECIAL TESTS:    FUNCTIONAL TESTS:  Decreased pain with return from L shoulder abduction with addition of scapular retraction.    Decreased pain with L cervical rotation with proper cervical posture (chin tuck)      (+) Hawkins-kennedy L shoulder   (+) empty can test     No L biceps pain with isometric, concentric and eccentric contraction  TREATMENT DATE: 05/03/24                                                                                                                                Therapeutic exercise Seated B scapular retraction  with   B scaption 10x3   Seated scapular depression isometrics resting her forearm against arm rest  L 10x5 seconds for 3 sets  R 10x5 seconds for 3 sets  Standign B shoulder ER yellow band 10x3  Slight shoulder flexion position to eliminate anterior shoulder stress  Standing B shoulder extension yellow band 10x   Then L shoulder extension yellow band attached to top of door 10x3 with scapular retraction   Seated manually resisted scapular retraction isometrics targeting the lower trap muscle  L 10x5 seconds for 2 sets   R 10x5 seconds    Improved exercise technique, movement at target joints, use of target  muscles after mod verbal, visual, tactile cues.    PATIENT EDUCATION:  Education details: there-ex, HEP, POC Person educated: Patient Education method: Explanation, Demonstration, Tactile cues, Verbal cues, and Handouts Education comprehension: verbalized understanding and returned demonstration  HOME EXERCISE PROGRAM: Access Code: [email or web address] URL: [email or web address] Date: 03/31/2024 Prepared by: Suzzane Estes  Exercises - Seated Cervical Retraction  - 3 x daily - 7 x weekly - 3 sets - 10 reps - 5 seconds hold - Seated Scapular Retraction  - 3 x daily - 7 x weekly - 3 sets - 10 reps - 5 seconds hold - Supine Head Nod with Deep Neck Flexor Activation  - 1 x daily - 7 x weekly - 3 sets - 10 reps - 5  seconds hold  - Isometric Tricep Extension   - 1 x daily - 7 x weekly - 3 sets - 10 reps - 5 seconds hold     Access Code: [email or web address] URL: [email or web address] Date: 04/19/2024 Prepared by: Janine Melbourne  Exercises - Standing Isometric Shoulder Flexion with Doorway - Arm Bent  - 1-2 x daily - 3-5 x weekly - 2 sets - 10 reps - Standing Isometric Shoulder Abduction with Doorway - Arm Bent  - 1-2 x daily - 3-5 x weekly - 2 sets - 10 reps - Standing Isometric Shoulder Internal Rotation at Doorway  - 1-2 x daily - 3-5 x weekly - 2 sets - 10  reps - Standing Isometric Shoulder External Rotation with Doorway  - 1-2 x daily - 3-5 x weekly - 2 sets - 10 reps   ASSESSMENT:  CLINICAL IMPRESSION:   Improving L shoulder and arm pain based on subjective reports. Continued working on promoting proper scapular posture, mechanics, and B scapular strengthening to decrease pressure to neck and L shoulder joint. Good muscle use reported with exercises. Pt states feeling better after session. Pt will benefit from continued skilled physical therapy services to decrease pain, improve strength and function.      OBJECTIVE IMPAIRMENTS: decreased ROM, decreased strength, impaired UE functional use, improper body mechanics, postural dysfunction, and pain.   ACTIVITY LIMITATIONS: carrying, lifting, and reach over head  PARTICIPATION LIMITATIONS:   PERSONAL FACTORS: Age, Fitness, and 1 comorbidity: HTN, hx of CA are also affecting patient's functional outcome.   REHAB POTENTIAL: Fair    CLINICAL DECISION MAKING: Stable/uncomplicated pain has improved since onset  EVALUATION COMPLEXITY: Low   GOALS: Goals reviewed with patient? Yes  SHORT TERM GOALS: Target date: 04/14/2024  Pt will be independent with her initial HEP to decrease L shoulder and neck pain, improve strength, function, and ability to reach and turn her head to the L more comfortably.  Baseline: Pt has started her initial HEP (03/31/2024); No questions with her HEP (05/03/2024) Goal status: MET    LONG TERM GOALS: Target date: 05/26/2024  Pt will have a decrease in L arm pain to 4/10 or less at worst to promote ability to lift as well as reach more comfortably. Baseline: at least 7/10 at worst for the past 3 months (4/11/20025); 3/10 at most for the past 7 days (05/03/2024) Goal status: MET  2.  Pt will improve her L lower trap and ER muscle strength by at least 1/2 MMT grade to promote ability to reach with her L arm more comfortably.  Baseline:  MMT Right eval Left eval   Shoulder external rotation  4 with L lateral deltoid pain  Lower trapezius (upright manually resisted) 4 4-   (03/31/2024)  Goal  status: INITIAL  3.  Pt will improve her L cervical AROM to 60 degrees without pain to promote ability to look around more comfortably.  Baseline:  Active ROM A/PROM (deg) eval  Right rotation 80  Left rotation 35 degrees with neck pain at L upper trap/C5 dermatome area, and L lateral neck muscle   (03/31/2024)  Goal status: INITIAL  4.  Patient will improve her UE functional Index (UEFI) sore by at least 8 points as a demonstration of improved function Baseline: UEFI score: 48/80 (03/31/2024) Goal status: INITIAL   PLAN:  PT FREQUENCY: 1-2x/week  PT DURATION: 8 weeks  PLANNED INTERVENTIONS: 97110-Therapeutic exercises, 97530- Therapeutic activity, 97112- Neuromuscular re-education, 97535- Self Care, [id number]- Manual therapy, [id number]- Electrical stimulation (unattended), [phone]- Ionotophoresis 4mg /ml Dexamethasone , and Patient/Family education  PLAN FOR NEXT SESSION: posture, cervical retraction, scapular strengthening, thoracic extension, glenohumeral mechanics, ER muscle strengthening, manual techniques, modalities PRN   Margorie Renner, PT, DPT 05/03/2024, 4:13 PM

## 2024-05-05 ENCOUNTER — Ambulatory Visit

## 2024-05-05 DIAGNOSIS — M79602 Pain in left arm: Secondary | ICD-10-CM

## 2024-05-05 DIAGNOSIS — M542 Cervicalgia: Secondary | ICD-10-CM

## 2024-05-05 DIAGNOSIS — M25512 Pain in left shoulder: Secondary | ICD-10-CM | POA: Diagnosis not present

## 2024-05-05 NOTE — Therapy (Signed)
 OUTPATIENT PHYSICAL THERAPY TREATMENT   Patient Name: ADRIEN FRYDRYCH MRN: [id number] DOB:[birth year], 78 y.o., female Today's Date: 05/05/2024  END OF SESSION:  PT End of Session - 05/05/24 0823     Visit Number 7    Number of Visits 17    Date for PT Re-Evaluation 05/26/24    PT Start Time 0823   Pt arrived late   PT Stop Time 0901    PT Time Calculation (min) 38 min    Activity Tolerance Patient tolerated treatment well    Behavior During Therapy [facility] for tasks assessed/performed                   Past Medical History:  Diagnosis Date   Allergy 1970's.   Blood transfusion without reported diagnosis 1983   Cancer (HCC) 02-2023   Cataract 2023   Environmental allergies    Hypercholesterolemia    Hypertension    Migraines    Past Surgical History:  Procedure Laterality Date   ABDOMINAL HYSTERECTOMY  12/21/1998   APPENDECTOMY  12/21/1998   BREAST SURGERY  05/2023   at [facility] lumpectomy   CATARACT EXTRACTION W/PHACO Left 04/23/2021   Procedure: CATARACT EXTRACTION PHACO AND INTRAOCULAR LENS PLACEMENT (IOC) LEFT panoptix toric 10.41 01:22.8 12.6%;  Surgeon: Annell Kidney, MD;  Location: [facility];  Service: Ophthalmology;  Laterality: Left;   CATARACT EXTRACTION W/PHACO Right 05/07/2021   Procedure: CATARACT EXTRACTION PHACO AND INTRAOCULAR LENS PLACEMENT (IOC) RIGHT  panoptix 12.85 01:33.8 13.7%;  Surgeon: Annell Kidney, MD;  Location: [facility];  Service: Ophthalmology;  Laterality: Right;   NOSE SURGERY  12/21/1965   SEPTOPLASTY     SHOULDER ARTHROSCOPY WITH OPEN ROTATOR CUFF REPAIR Right 12/05/2015   Procedure: SHOULDER ARTHROSCOPY WITH MINI OPEN ROTATOR CUFF REPAIR. DISTAL CLAVICLE EXCISION;  Surgeon: Rande Bushy, MD;  Location: [facility];  Service: Orthopedics;  Laterality: Right;   Patient Active Problem List   Diagnosis Date Noted   Cystitis 04/14/2024   Urinary frequency 04/14/2024   Neck pain 04/02/2024   Skin mole  04/02/2024   Toe pain 04/02/2024   Sprain of left upper arm 03/01/2024   Rash 12/12/2023   Genetic testing 05/24/2023   Carcinoma of upper-outer quadrant of left breast in female, estrogen receptor positive (HCC) 03/22/2023   Viral URI 03/15/2023   Headache 01/14/2023   Glossitis 05/02/2022   Aphthous ulcer 04/11/2022   Leukocytosis 04/07/2022   Viral syndrome 03/28/2022   Respiratory tract infection 03/17/2022   Nausea and vomiting 03/17/2022   Vitamin D  deficiency 12/11/2021   Mouth sores 10/19/2021   Abnormal mammogram 06/26/2021   History of COVID-19 06/18/2021   Bleeding 03/15/2021   Left foot pain 03/04/2021   Fatigue 01/05/2021   Angioleiomyoma 11/18/2020   Nasal drainage 08/12/2020   Stress 04/22/2020   Sinus congestion 11/08/2019   Allergy 07/02/2019   Low back pain 12/11/2018   Left arm pain 07/24/2018   Right hip pain 05/24/2018   Left leg swelling 09/03/2017   Chest pressure 06/16/2017   Altered sensation of foot 06/16/2017   Recurrent UTI 05/12/2017   Constipation 02/16/2016   UTI (urinary tract infection) 01/06/2016   Pain in joint, shoulder region 12/05/2015   Loss of weight 11/25/2015   History of abnormal mammogram 08/26/2015   Health care maintenance 05/19/2015   Osteoporosis 04/17/2014   Abnormal liver function tests 12/17/2013   HTN (hypertension) 12/04/2012   Hypercholesterolemia 12/04/2012   Environmental allergies 12/04/2012    PCP: Dellar Fenton,  MD  REFERRING PROVIDER: Dellar Fenton, MD  REFERRING DIAG: [phone] (ICD-10-CM) - Left arm pain  THERAPY DIAG:  Acute pain of left shoulder  Pain in left arm  Cervicalgia  Rationale for Evaluation and Treatment: Rehabilitation  ONSET DATE: 3 weeks ago from 03/31/2024  SUBJECTIVE:                                                                                                                                                                                                          SUBJECTIVE STATEMENT:   L shoulder and L arm are pretty good. Just a tiny bit of pain, but not bad.       Hand dominance: Left  PERTINENT HISTORY:   L arm pain. Pain began at least 3 weeks ago. Pt lifted a heavy teapot of water and felt a pop in her L shoulder. Pain is a lot better since onset. Pt is also taking exercises for her L UE 3x/week at night. Pt has also had a crick in her neck for more than 3 days which has been bothering her more. Pt was told ehtat her arm is a sprain. Pt also had tingling B hand and arm (along the C5-C6 dermatome)   Blood pressure is controlled.  No latex allergies  PAIN:  Are you having pain? Yes: NPRS scale:   Pain location: L anterior shoulder and arm Pain description: unable to describe Aggravating factors: supination, pronation, raising her arm Relieving factors: rest  PRECAUTIONS: Osteoporosis, Hx of CA  RED FLAGS: None     WEIGHT BEARING RESTRICTIONS: No  FALLS:  Has patient fallen in last 6 months? No  LIVING ENVIRONMENT: Lives with: lives with their spouse   OCCUPATION: retired  PLOF: Independent  PATIENT GOALS: TP be able to do her exercises without worrying about it.   NEXT MD VISIT: yes (pt does not know)  OBJECTIVE:  Note: Objective measures were completed at Evaluation unless otherwise noted.  DIAGNOSTIC FINDINGS:     PATIENT SURVEYS:  UEFI score: 48/80 (03/31/2024)  COGNITION: Overall cognitive status: Within functional limits for tasks assessed  SENSATION:   POSTURE: forward neck, B protracted shoulders, L lateral shift neck, kyphosis, L scapular protraction > R   PALPATION:    CERVICAL ROM:   Active ROM A/PROM (deg) eval  Flexion full  Extension Full with L posterior lateral neck pain  Right lateral flexion Limited with L posterior lateral neck pull  Left lateral flexion Very limited with R posterior lateral neck pull  Right rotation 80  Left rotation 35 degrees  with neck pain at L upper  trap/C5 dermatome area, and L lateral neck muscle   (Blank rows = not tested)  UPPER EXTREMITY ROM:  Active ROM Right eval Left eval  Shoulder flexion 161 156 (175 AAROM)  Shoulder extension    Shoulder abduction 175 177  Shoulder adduction    Shoulder extension    Shoulder internal rotation (functional) R index finger to L inferior scapular angle Full no pain  Shoulder external rotation (functional) full Full, slight pain  Elbow flexion    Elbow extension    Wrist flexion    Wrist extension    Wrist ulnar deviation    Wrist radial deviation    Wrist pronation    Wrist supination     (Blank rows = not tested)  UPPER EXTREMITY MMT:  MMT Right eval Left eval  Shoulder flexion  4+  Shoulder extension    Shoulder abduction  4+ (able to perform with proper scapular position)  Shoulder adduction    Shoulder extension    Shoulder internal rotation  4+  Shoulder external rotation  4 with L lateral deltoid pain  Middle trapezius    Lower trapezius (upright manually resisted) 4 4-  Elbow flexion  4+  Elbow extension  4  Wrist flexion    Wrist extension    Wrist ulnar deviation    Wrist radial deviation    Wrist pronation    Wrist supination    Grip strength     (Blank rows = not tested)  CERVICAL SPECIAL TESTS:    FUNCTIONAL TESTS:  Decreased pain with return from L shoulder abduction with addition of scapular retraction.    Decreased pain with L cervical rotation with proper cervical posture (chin tuck)      (+) Hawkins-kennedy L shoulder   (+) empty can test     No L biceps pain with isometric, concentric and eccentric contraction  TREATMENT DATE: 05/05/24                                                                                                                                Neuromuscular re education  Seated scapular depression isometrics resting her forearm against arm rest  L 10x5 seconds for 2 sets  R 10x5 seconds for 2 sets  Seated B scapular  retraction with   B scaption 10x3  Standign B shoulder ER yellow band 10x3  Slight shoulder flexion position to eliminate anterior shoulder stress  Standing L shoulder extension yellow band attached to top of door 10x3 with scapular retraction  Good posterior glide of humeral head palpated.   Seated manually resisted scapular retraction isometrics targeting the lower trap muscle  L 10x5 seconds for 3 sets   R 10x5 seconds for 3 sets  Seated chin tucks 10x5 seconds to promote thoracic extension and scapular retraction and decrease stress to L anterior shoulder and neck.     Improved exercise technique, movement at target  joints, use of target muscles after mod verbal, visual, tactile cues.    PATIENT EDUCATION:  Education details: there-ex, HEP, POC Person educated: Patient Education method: Explanation, Demonstration, Tactile cues, Verbal cues, and Handouts Education comprehension: verbalized understanding and returned demonstration  HOME EXERCISE PROGRAM: Access Code: [email or web address] URL: [email or web address] Date: 03/31/2024 Prepared by: Suzzane Estes  Exercises - Seated Cervical Retraction  - 3 x daily - 7 x weekly - 3 sets - 10 reps - 5 seconds hold - Seated Scapular Retraction  - 3 x daily - 7 x weekly - 3 sets - 10 reps - 5 seconds hold - Supine Head Nod with Deep Neck Flexor Activation  - 1 x daily - 7 x weekly - 3 sets - 10 reps - 5  seconds hold  - Isometric Tricep Extension   - 1 x daily - 7 x weekly - 3 sets - 10 reps - 5 seconds hold     Access Code: [email or web address] URL: [email or web address] Date: 04/19/2024 Prepared by: Janine Melbourne  Exercises - Standing Isometric Shoulder Flexion with Doorway - Arm Bent  - 1-2 x daily - 3-5 x weekly - 2 sets - 10 reps - Standing Isometric Shoulder Abduction with Doorway - Arm Bent  - 1-2 x daily - 3-5 x weekly - 2 sets - 10 reps - Standing Isometric Shoulder Internal Rotation at Doorway  - 1-2 x daily -  3-5 x weekly - 2 sets - 10 reps - Standing Isometric Shoulder External Rotation with Doorway  - 1-2 x daily - 3-5 x weekly - 2 sets - 10 reps   ASSESSMENT:  CLINICAL IMPRESSION:    Continued working on promoting proper scapular posture, mechanics, and B scapular strengthening to decrease pressure to neck and L shoulder joint. Good posterior glide of L humeral head palpated with shoulder extension exercise. Pt states PT helps. Pt will benefit from continued skilled physical therapy services to decrease pain, improve strength and function.      OBJECTIVE IMPAIRMENTS: decreased ROM, decreased strength, impaired UE functional use, improper body mechanics, postural dysfunction, and pain.   ACTIVITY LIMITATIONS: carrying, lifting, and reach over head  PARTICIPATION LIMITATIONS:   PERSONAL FACTORS: Age, Fitness, and 1 comorbidity: HTN, hx of CA are also affecting patient's functional outcome.   REHAB POTENTIAL: Fair    CLINICAL DECISION MAKING: Stable/uncomplicated pain has improved since onset  EVALUATION COMPLEXITY: Low   GOALS: Goals reviewed with patient? Yes  SHORT TERM GOALS: Target date: 04/14/2024  Pt will be independent with her initial HEP to decrease L shoulder and neck pain, improve strength, function, and ability to reach and turn her head to the L more comfortably.  Baseline: Pt has started her initial HEP (03/31/2024); No questions with her HEP (05/03/2024) Goal status: MET    LONG TERM GOALS: Target date: 05/26/2024  Pt will have a decrease in L arm pain to 4/10 or less at worst to promote ability to lift as well as reach more comfortably. Baseline: at least 7/10 at worst for the past 3 months (4/11/20025); 3/10 at most for the past 7 days (05/03/2024) Goal status: MET  2.  Pt will improve her L lower trap and ER muscle strength by at least 1/2 MMT grade to promote ability to reach with her L arm more comfortably.  Baseline:  MMT Right eval Left eval  Shoulder  external rotation  4 with L lateral deltoid pain  Lower trapezius (upright manually resisted) 4 4-   (03/31/2024)  Goal status:  INITIAL  3.  Pt will improve her L cervical AROM to 60 degrees without pain to promote ability to look around more comfortably.  Baseline:  Active ROM A/PROM (deg) eval  Right rotation 80  Left rotation 35 degrees with neck pain at L upper trap/C5 dermatome area, and L lateral neck muscle   (03/31/2024)  Goal status: INITIAL  4.  Patient will improve her UE functional Index (UEFI) sore by at least 8 points as a demonstration of improved function Baseline: UEFI score: 48/80 (03/31/2024) Goal status: INITIAL   PLAN:  PT FREQUENCY: 1-2x/week  PT DURATION: 8 weeks  PLANNED INTERVENTIONS: 97110-Therapeutic exercises, 97530- Therapeutic activity, [id number]- Neuromuscular re-education, 97535- Self Care, [id number]- Manual therapy, [id number]- Electrical stimulation (unattended), [id number]- Ionotophoresis 4mg /ml Dexamethasone , and Patient/Family education  PLAN FOR NEXT SESSION: posture, cervical retraction, scapular strengthening, thoracic extension, glenohumeral mechanics, ER muscle strengthening, manual techniques, modalities PRN   Neria Procter, PT, DPT 05/05/2024, 9:02 AM

## 2024-05-09 ENCOUNTER — Ambulatory Visit

## 2024-05-12 ENCOUNTER — Ambulatory Visit: Admitting: Internal Medicine

## 2024-05-12 ENCOUNTER — Encounter: Payer: Self-pay | Admitting: Internal Medicine

## 2024-05-12 VITALS — BP 122/68 | HR 75 | Ht 60.0 in | Wt 127.0 lb

## 2024-05-12 DIAGNOSIS — E7800 Pure hypercholesterolemia, unspecified: Secondary | ICD-10-CM

## 2024-05-12 DIAGNOSIS — R350 Frequency of micturition: Secondary | ICD-10-CM | POA: Diagnosis not present

## 2024-05-12 DIAGNOSIS — I10 Essential (primary) hypertension: Secondary | ICD-10-CM

## 2024-05-12 DIAGNOSIS — D219 Benign neoplasm of connective and other soft tissue, unspecified: Secondary | ICD-10-CM

## 2024-05-12 DIAGNOSIS — M810 Age-related osteoporosis without current pathological fracture: Secondary | ICD-10-CM

## 2024-05-12 DIAGNOSIS — C50412 Malignant neoplasm of upper-outer quadrant of left female breast: Secondary | ICD-10-CM

## 2024-05-12 DIAGNOSIS — F439 Reaction to severe stress, unspecified: Secondary | ICD-10-CM

## 2024-05-12 DIAGNOSIS — Z170 Estrogen receptor positive status [ER+]: Secondary | ICD-10-CM

## 2024-05-12 DIAGNOSIS — R7989 Other specified abnormal findings of blood chemistry: Secondary | ICD-10-CM

## 2024-05-12 DIAGNOSIS — M542 Cervicalgia: Secondary | ICD-10-CM

## 2024-05-12 LAB — URINALYSIS, ROUTINE W REFLEX MICROSCOPIC
Bilirubin Urine: NEGATIVE
Hgb urine dipstick: NEGATIVE
Ketones, ur: NEGATIVE
Leukocytes,Ua: NEGATIVE
Nitrite: NEGATIVE
Specific Gravity, Urine: <=1.005 — AB (ref 1.000–1.030)
Total Protein, Urine: NEGATIVE
Urine Glucose: NEGATIVE
Urobilinogen, UA: 0.2 (ref 0.0–1.0)
pH: 6 (ref 5.0–8.0)

## 2024-05-12 NOTE — Progress Notes (Signed)
 Subjective:    Patient ID: Jamie Elliott, female    DOB: 17-Oct-1946, 78 y.o.   MRN: [id number]  Patient here for  Chief Complaint  Patient presents with   Medical Management of Chronic Issues    6 week follow up     HPI Here for a scheduled follow up - follow up regarding hypercholesterolemia and hypertension. Recently diagnosed with breast cancer. Is s/p left breast partial mastectomy 04/22/23, with f/u surgery 05/20/23. XRT. Had f/u [facility] - 10/15/23 - recommended f/u and diagnostic mammogram in 05/2024. Saw Dr Valentine Gasmen 11/29/23 - continues on anastrozole .  had intolerance to fosamax . Saw endocrinology 02/14/24 - recommended reclast. (Reclast infusion 03/28/24). Has been going to PT for cervicalgia and left arm pain.  Breathing stable. No chest pain reported. Some increased urinary frequency. Recently treated for UTI. Symptoms have improved, but does still report some increased frequency. Would like to be checked to confirm clearance.    Past Medical History:  Diagnosis Date   Allergy 1970's.   Blood transfusion without reported diagnosis 1983   Cancer (HCC) 02-2023   Cataract 2023   Environmental allergies    Hypercholesterolemia    Hypertension    Migraines    Past Surgical History:  Procedure Laterality Date   ABDOMINAL HYSTERECTOMY  12/21/1998   APPENDECTOMY  12/21/1998   BREAST SURGERY  05/2023   at [facility] lumpectomy   CATARACT EXTRACTION W/PHACO Left 04/23/2021   Procedure: CATARACT EXTRACTION PHACO AND INTRAOCULAR LENS PLACEMENT (IOC) LEFT panoptix toric 10.41 01:22.8 12.6%;  Surgeon: Annell Kidney, MD;  Location: [facility];  Service: Ophthalmology;  Laterality: Left;   CATARACT EXTRACTION W/PHACO Right 05/07/2021   Procedure: CATARACT EXTRACTION PHACO AND INTRAOCULAR LENS PLACEMENT (IOC) RIGHT  panoptix 12.85 01:33.8 13.7%;  Surgeon: Annell Kidney, MD;  Location: [facility];  Service: Ophthalmology;  Laterality: Right;   NOSE SURGERY  12/21/1965    SEPTOPLASTY     SHOULDER ARTHROSCOPY WITH OPEN ROTATOR CUFF REPAIR Right 12/05/2015   Procedure: SHOULDER ARTHROSCOPY WITH MINI OPEN ROTATOR CUFF REPAIR. DISTAL CLAVICLE EXCISION;  Surgeon: Rande Bushy, MD;  Location: [facility] OR[facility];  Service: Orthopedics;  Laterality: Right;   Family History  Problem Relation Age of Onset   Heart disease Mother    Hyperlipidemia Mother    Hypertension Mother    Lung cancer Father        dx 65s   Hyperlipidemia Father    Hypertension Father    Prostate cancer Father        dx 38s   Cancer Father    Breast cancer Maternal Aunt        dx 60s-70s   Breast cancer Maternal Grandmother        dx 90s   Bladder Cancer Neg Hx    Kidney cancer Neg Hx    Social History   Socioeconomic History   Marital status: Married    Spouse name: Not on file   Number of children: 2   Years of education: Not on file   Highest education level: Bachelor's degree (e.g., BA, AB, BS)  Occupational History   Not on file  Tobacco Use   Smoking status: Never   Smokeless tobacco: Never  Vaping Use   Vaping status: Never Used  Substance and Sexual Activity   Alcohol use: No    Alcohol/week: 0.0 standard drinks of alcohol   Drug use: No   Sexual activity: Not Currently  Other Topics Concern   Not on file  Social History Narrative   married   Social Drivers of Corporate investment banker Strain: Low Risk  (02/13/2024)   [facility]   Overall Financial Resource Strain (CARDIA)    Difficulty of Paying Living Expenses: Not hard at all  Food Insecurity: No Food Insecurity (02/13/2024)   Received from [facility]   Hunger Vital Sign    Worried About Running Out of Food in the Last Year: Never true    Ran Out of Food in the Last Year: Never true  Transportation Needs: No Transportation Needs (02/13/2024)   Received from [facility] - Transportation    In the past 12 months, has lack of  transportation kept you from medical appointments or from getting medications?: No    Lack of Transportation (Non-Medical): No  Physical Activity: Sufficiently Active (01/10/2024)   Exercise Vital Sign    Days of Exercise per Week: 5 days    Minutes of Exercise per Session: 40 min  Stress: No Stress Concern Present (01/10/2024)   [employer] of Occupational Health - Occupational Stress Questionnaire    Feeling of Stress : Not at all  Social Connections: Socially Integrated (01/10/2024)   Social Connection and Isolation Panel [NHANES]    Frequency of Communication with Friends and Family: More than three times a week    Frequency of Social Gatherings with Friends and Family: Twice a week    Attends Religious Services: More than 4 times per year    Active Member of [employer] or Organizations: Yes    Attends Engineer, structural: More than 4 times per year    Marital Status: Married     Review of Systems  Constitutional:  Negative for appetite change and fever.  HENT:  Negative for congestion and sinus pressure.   Respiratory:  Negative for cough, chest tightness and shortness of breath.   Cardiovascular:  Negative for chest pain, palpitations and leg swelling.  Gastrointestinal:  Negative for abdominal pain, diarrhea, nausea and vomiting.  Genitourinary:  Positive for frequency. Negative for difficulty urinating.  Musculoskeletal:  Negative for joint swelling and myalgias.  Skin:  Negative for color change and rash.  Neurological:  Negative for dizziness and headaches.  Psychiatric/Behavioral:  Negative for agitation and dysphoric mood.        Objective:     BP 122/68   Pulse 75   Ht 5' (1.524 m)   Wt 127 lb (57.6 kg)   LMP 11/28/1998   SpO2 98%   BMI 24.80 kg/m  Wt Readings from Last 3 Encounters:  05/12/24 127 lb (57.6 kg)  04/14/24 126 lb (57.2 kg)  03/28/24 125 lb 9.6 oz (57 kg)    Physical Exam Vitals reviewed.  Constitutional:      General: She is not  in acute distress.    Appearance: Normal appearance.  HENT:     Head: Normocephalic and atraumatic.     Right Ear: External ear normal.     Left Ear: External ear normal.     Mouth/Throat:     Pharynx: No oropharyngeal exudate or posterior oropharyngeal erythema.  Eyes:     General: No scleral icterus.       Right eye: No discharge.        Left eye: No discharge.     Conjunctiva/sclera: Conjunctivae normal.  Neck:     Thyroid : No thyromegaly.  Cardiovascular:     Rate and Rhythm: Normal rate  and regular rhythm.  Pulmonary:     Effort: No respiratory distress.     Breath sounds: Normal breath sounds. No wheezing.  Abdominal:     General: Bowel sounds are normal.     Palpations: Abdomen is soft.     Tenderness: There is no abdominal tenderness.  Musculoskeletal:        General: No swelling or tenderness.     Cervical back: Neck supple. No tenderness.  Lymphadenopathy:     Cervical: No cervical adenopathy.  Skin:    Findings: No erythema or rash.  Neurological:     Mental Status: She is alert.  Psychiatric:        Mood and Affect: Mood normal.        Behavior: Behavior normal.         Outpatient Encounter Medications as of 05/12/2024  Medication Sig   acetaminophen  (TYLENOL ) 500 MG tablet Take 500 mg by mouth every 6 (six) hours as needed.   amLODipine  (NORVASC ) 5 MG tablet Take 1 tablet (5 mg total) by mouth daily.   anastrozole  (ARIMIDEX ) 1 MG tablet Take 1 tablet (1 mg total) by mouth daily.   Ascorbic Acid (VITAMIN C) 1000 MG tablet Take 1,000 mg by mouth daily.   atorvastatin  (LIPITOR) 40 MG tablet Take 1 tablet (40 mg total) by mouth daily.   cetirizine  (ZYRTEC ) 10 MG tablet Take 1 tablet (10 mg total) by mouth daily.   Cholecalciferol (VITAMIN D3) 25 MCG (1000 UT) CAPS Take 1 capsule by mouth daily.   CRANBERRY EXTRACT PO Take 1 capsule by mouth 2 (two) times daily.   D-Mannose 500 MG CAPS Take 500 mg by mouth in the morning and at bedtime.    hydrochlorothiazide  (HYDRODIURIL ) 12.5 MG tablet Take 1 tablet (12.5 mg total) by mouth daily.   ibuprofen  (ADVIL ) 600 MG tablet Take 1 tablet (600 mg total) by mouth 3 (three) times daily.   losartan  (COZAAR ) 100 MG tablet Take 1 tablet (100 mg total) by mouth daily.   polyethylene glycol (MIRALAX / GLYCOLAX) 17 g packet Take 17 g by mouth every other day.   Probiotic Product (ALIGN PO) Take by mouth as needed.   triamcinolone  (NASACORT ) 55 MCG/ACT nasal inhaler Place 2 sprays into the nose as needed.   No facility-administered encounter medications on file as of 05/12/2024.     Lab Results  Component Value Date   WBC 5.9 11/08/2023   HGB 12.8 11/08/2023   HCT 38.4 11/08/2023   PLT 226.0 11/08/2023   GLUCOSE 89 03/24/2024   CHOL 154 03/24/2024   TRIG 96.0 03/24/2024   HDL 57.40 03/24/2024   LDLDIRECT 103.0 05/21/2015   LDLCALC 77 03/24/2024   ALT 21 03/24/2024   AST 18 03/24/2024   NA 141 03/24/2024   K 3.7 03/24/2024   CL 105 03/24/2024   CREATININE 0.76 03/24/2024   BUN 19 03/24/2024   CO2 29 03/24/2024   TSH 1.50 03/24/2024   INR 1.0 03/17/2022    DG Bone Density Result Date: 08/09/2023 EXAM: DUAL X-RAY ABSORPTIOMETRY (DXA) FOR BONE MINERAL DENSITY IMPRESSION: Your patient Maeby Vankleeck completed a BMD test on 08/09/2023 using the [employer] DXA System (software version: 14.10) manufactured by [employer]. The following summarizes the results of our evaluation. Technologist:VLM PATIENT BIOGRAPHICAL: Name: Allesandra, Huebsch Patient ID: [id number] Birth Date: 12/19/1946 Height: 62.0 in. Gender: Female Exam Date: 08/09/2023 Weight: 125.6 lbs. Indications: Caucasian, History of Breast Cancer, Hysterectomy, Postmenopausal Fractures: Treatments: calcium  w/ vit  D DENSITOMETRY RESULTS: Site          Region     Measured Date Measured Age WHO Classification Young Adult T-score BMD         %Change vs. Previous Significant Change (*) Right Forearm Radius 33% 8/09/[age].5  Osteopenia -1.6 0.737 g/cm2 - - DualFemur Total Left 8/09/2023 [age] Osteoporosis -2.6 0.686 g/cm2 - - ASSESSMENT: The BMD measured at Femur Total Left is 0.686 g/cm2 with a T-score of -2.6. This patient is considered osteoporotic according to World Health Organization [facility]) criteria. Lumbar spine was not utilized due to advanced degenerative changes and scoliosis. The scan quality is good. World Science writer [facility]) criteria for post-menopausal, Caucasian Women: Normal:                   T-score at or above -1 SD Osteopenia/low bone mass: T-score between -1 and -2.5 SD Osteoporosis:             T-score at or below -2.5 SD RECOMMENDATIONS: 1. All patients should optimize calcium  and vitamin D  intake. 2. Consider FDA-approved medical therapies in postmenopausal women and men aged 98 years and older, based on the following: a. A hip or vertebral(clinical or morphometric) fracture b. T-score < -2.5 at the femoral neck or spine after appropriate evaluation to exclude secondary causes c. Low bone mass (T-score between -1.0 and -2.5 at the femoral neck or spine) and a 10-year probability of a hip fracture > 3% or a 10-year probability of a major osteoporosis-related fracture > 20% based on the [place] -adapted WHO algorithm 3. Clinician judgment and/or patient preferences may indicate treatment for people with 10-year fracture probabilities above or below these levels FOLLOW-UP: People with diagnosed cases of osteoporosis or at high risk for fracture should have regular bone mineral density tests. For patients eligible for Medicare, routine testing is allowed once every 2 years. The testing frequency can be increased to one year for patients who have rapidly progressing disease, those who are receiving or discontinuing medical therapy to restore bone mass, or have additional risk factors. I have reviewed this report, and agree with the above findings. [facility] Electronically Signed   By: Alinda Apley  M.D.   On: 08/09/2023 14:57       Assessment & Plan:  Urinary frequency Assessment & Plan: Check urine to confirm infection cleared. Wait for culture results.   Orders: -     Urinalysis, Routine w reflex microscopic -     Urine Culture  Abnormal liver function tests Assessment & Plan: Liver panel 03/24/24 wnl.    Angioleiomyoma Assessment & Plan: Saw urology 06/2023  - Not visualized on recent RUS, so we can discontinue screening - per urology. Did recommend f/u in one year.     Carcinoma of upper-outer quadrant of left breast in female, estrogen receptor positive (HCC) Assessment & Plan: Left breast stage I breast cancer ER/PR positive HER2 negative [s/p lumpectomy [facility]; Dr.Hwang] ; pT1b pSnLNx-M0- G-2. S/p left breast partial mastectomy 04/22/23. Had to go back for f/u surgery 05/20/23.  XRT.  Incision - closed.  Started anastrozole . Previously breast lesion - resolved.  Continue f/u with oncology.    Primary hypertension Assessment & Plan: Continue amlodipine  and hctz. Follow pressures. Follow metabolic panel. No changes today.    Hypercholesterolemia Assessment & Plan: Continue lipitor.  Low cholesterol diet and exercise.  Follow lipid panel and liver function tests. No changes in medication today.    Osteoporosis without current pathological fracture,  unspecified osteoporosis type Assessment & Plan: Reclast infusion 03/28/24.    Stress Assessment & Plan: Overall appears to be handling things relatively well. Follow.    Cervicalgia Assessment & Plan: Neck and left arm pain. Continue PT.       Dellar Fenton, MD

## 2024-05-13 ENCOUNTER — Ambulatory Visit: Payer: Self-pay | Admitting: Internal Medicine

## 2024-05-13 LAB — URINE CULTURE
MICRO NUMBER:: 16495077
Result:: NO GROWTH
SPECIMEN QUALITY:: ADEQUATE

## 2024-05-16 ENCOUNTER — Encounter

## 2024-05-17 ENCOUNTER — Ambulatory Visit

## 2024-05-17 DIAGNOSIS — M79602 Pain in left arm: Secondary | ICD-10-CM | POA: Diagnosis not present

## 2024-05-17 DIAGNOSIS — M25512 Pain in left shoulder: Secondary | ICD-10-CM

## 2024-05-17 DIAGNOSIS — M542 Cervicalgia: Secondary | ICD-10-CM | POA: Diagnosis not present

## 2024-05-17 NOTE — Therapy (Addendum)
 OUTPATIENT PHYSICAL THERAPY TREATMENT   Patient Name: Jamie Elliott MRN: [id number] DOB:January 01, 1946, 78 y.o., female Today's Date: 05/17/2024  END OF SESSION:  PT End of Session - 05/17/24 1434     Visit Number 8    Number of Visits 17    Date for PT Re-Evaluation 05/26/24    PT Start Time 1434    PT Stop Time 1513    PT Time Calculation (min) 39 min    Activity Tolerance Patient tolerated treatment well    Behavior During Therapy [facility] for tasks assessed/performed                    Past Medical History:  Diagnosis Date   Allergy 1970's.   Blood transfusion without reported diagnosis 1983   Cancer (HCC) 02-2023   Cataract 2023   Environmental allergies    Hypercholesterolemia    Hypertension    Migraines    Past Surgical History:  Procedure Laterality Date   ABDOMINAL HYSTERECTOMY  12/21/1998   APPENDECTOMY  12/21/1998   BREAST SURGERY  05/2023   at [facility] lumpectomy   CATARACT EXTRACTION W/PHACO Left 04/23/2021   Procedure: CATARACT EXTRACTION PHACO AND INTRAOCULAR LENS PLACEMENT (IOC) LEFT panoptix toric 10.41 01:22.8 12.6%;  Surgeon: Annell Kidney, MD;  Location: [facility];  Service: Ophthalmology;  Laterality: Left;   CATARACT EXTRACTION W/PHACO Right 05/07/2021   Procedure: CATARACT EXTRACTION PHACO AND INTRAOCULAR LENS PLACEMENT (IOC) RIGHT  panoptix 12.85 01:33.8 13.7%;  Surgeon: Annell Kidney, MD;  Location: [facility];  Service: Ophthalmology;  Laterality: Right;   NOSE SURGERY  12/21/1965   SEPTOPLASTY     SHOULDER ARTHROSCOPY WITH OPEN ROTATOR CUFF REPAIR Right 12/05/2015   Procedure: SHOULDER ARTHROSCOPY WITH MINI OPEN ROTATOR CUFF REPAIR. DISTAL CLAVICLE EXCISION;  Surgeon: Rande Bushy, MD;  Location: [facility];  Service: Orthopedics;  Laterality: Right;   Patient Active Problem List   Diagnosis Date Noted   Cystitis 04/14/2024   Urinary frequency 04/14/2024   Neck pain 04/02/2024   Skin mole 04/02/2024   Toe  pain 04/02/2024   Sprain of left upper arm 03/01/2024   Rash 12/12/2023   Genetic testing 05/24/2023   Carcinoma of upper-outer quadrant of left breast in female, estrogen receptor positive (HCC) 03/22/2023   Viral URI 03/15/2023   Headache 01/14/2023   Glossitis 05/02/2022   Aphthous ulcer 04/11/2022   Leukocytosis 04/07/2022   Viral syndrome 03/28/2022   Respiratory tract infection 03/17/2022   Nausea and vomiting 03/17/2022   Vitamin D  deficiency 12/11/2021   Mouth sores 10/19/2021   Abnormal mammogram 06/26/2021   History of COVID-19 06/18/2021   Bleeding 03/15/2021   Left foot pain 03/04/2021   Fatigue 01/05/2021   Angioleiomyoma 11/18/2020   Nasal drainage 08/12/2020   Stress 04/22/2020   Sinus congestion 11/08/2019   Allergy 07/02/2019   Low back pain 12/11/2018   Left arm pain 07/24/2018   Right hip pain 05/24/2018   Left leg swelling 09/03/2017   Chest pressure 06/16/2017   Altered sensation of foot 06/16/2017   Recurrent UTI 05/12/2017   Constipation 02/16/2016   UTI (urinary tract infection) 01/06/2016   Pain in joint, shoulder region 12/05/2015   Loss of weight 11/25/2015   History of abnormal mammogram 08/26/2015   Health care maintenance 05/19/2015   Osteoporosis 04/17/2014   Abnormal liver function tests 12/17/2013   HTN (hypertension) 12/04/2012   Hypercholesterolemia 12/04/2012   Environmental allergies 12/04/2012    PCP: Dellar Fenton, MD  REFERRING  PROVIDER: Dellar Fenton, MD  REFERRING DIAG: [id number] (ICD-10-CM) - Left arm pain  THERAPY DIAG:  Acute pain of left shoulder  Pain in left arm  Cervicalgia  Rationale for Evaluation and Treatment: Rehabilitation  ONSET DATE: 3 weeks ago from 03/31/2024  SUBJECTIVE:                                                                                                                                                                                                         SUBJECTIVE STATEMENT:   L  shoulder and L arm are pretty good. Had some trouble when she went on vacation getting into and out of her car. No L shoulder or arm pain currently.      Hand dominance: Left  PERTINENT HISTORY:   L arm pain. Pain began at least 3 weeks ago. Pt lifted a heavy teapot of water and felt a pop in her L shoulder. Pain is a lot better since onset. Pt is also taking exercises for her L UE 3x/week at night. Pt has also had a crick in her neck for more than 3 days which has been bothering her more. Pt was told ehtat her arm is a sprain. Pt also had tingling B hand and arm (along the C5-C6 dermatome)   Blood pressure is controlled.  No latex allergies  PAIN:  Are you having pain? Yes: NPRS scale:   Pain location: L anterior shoulder and arm Pain description: unable to describe Aggravating factors: supination, pronation, raising her arm Relieving factors: rest  PRECAUTIONS: Osteoporosis, Hx of CA  RED FLAGS: None     WEIGHT BEARING RESTRICTIONS: No  FALLS:  Has patient fallen in last 6 months? No  LIVING ENVIRONMENT: Lives with: lives with their spouse   OCCUPATION: retired  PLOF: Independent  PATIENT GOALS: TP be able to do her exercises without worrying about it.   NEXT MD VISIT: yes (pt does not know)  OBJECTIVE:  Note: Objective measures were completed at Evaluation unless otherwise noted.  DIAGNOSTIC FINDINGS:     PATIENT SURVEYS:  UEFI score: 48/80 (03/31/2024)  COGNITION: Overall cognitive status: Within functional limits for tasks assessed  SENSATION:   POSTURE: forward neck, B protracted shoulders, L lateral shift neck, kyphosis, L scapular protraction > R   PALPATION:    CERVICAL ROM:   Active ROM A/PROM (deg) eval  Flexion full  Extension Full with L posterior lateral neck pain  Right lateral flexion Limited with L posterior lateral neck pull  Left lateral flexion Very limited with R posterior lateral neck pull  Right rotation 80  Left  rotation 35 degrees with neck pain at L upper trap/C5 dermatome area, and L lateral neck muscle   (Blank rows = not tested)  UPPER EXTREMITY ROM:  Active ROM Right eval Left eval  Shoulder flexion 161 156 (175 AAROM)  Shoulder extension    Shoulder abduction 175 177  Shoulder adduction    Shoulder extension    Shoulder internal rotation (functional) R index finger to L inferior scapular angle Full no pain  Shoulder external rotation (functional) full Full, slight pain  Elbow flexion    Elbow extension    Wrist flexion    Wrist extension    Wrist ulnar deviation    Wrist radial deviation    Wrist pronation    Wrist supination     (Blank rows = not tested)  UPPER EXTREMITY MMT:  MMT Right eval Left eval  Shoulder flexion  4+  Shoulder extension    Shoulder abduction  4+ (able to perform with proper scapular position)  Shoulder adduction    Shoulder extension    Shoulder internal rotation  4+  Shoulder external rotation  4 with L lateral deltoid pain  Middle trapezius    Lower trapezius (upright manually resisted) 4 4-  Elbow flexion  4+  Elbow extension  4  Wrist flexion    Wrist extension    Wrist ulnar deviation    Wrist radial deviation    Wrist pronation    Wrist supination    Grip strength     (Blank rows = not tested)  CERVICAL SPECIAL TESTS:    FUNCTIONAL TESTS:  Decreased pain with return from L shoulder abduction with addition of scapular retraction.    Decreased pain with L cervical rotation with proper cervical posture (chin tuck)      (+) Hawkins-kennedy L shoulder   (+) empty can test     No L biceps pain with isometric, concentric and eccentric contraction  TREATMENT DATE: 05/17/24                                                                                                                                Neuromuscular re education   Manually resisted L scapular retraction targeting the lower trap, standing L shoulder ER  Seated L  and R cervical rotation  Reviewed progress/current status with PT towards goals.   Reviewed POC: graduate to HEP after this week.   Standing B scapular retraction yellow band 10x3 with 5 second holds   Seated scapular depression isometrics resting her forearm against arm rest  L 10x5 seconds for 2 sets  R 10x5 seconds for 2 sets  Seated B scapular retraction with   B scaption 10x3  Standing L shoulder extension yellow band attached to top of door 10x3 with scapular retraction   Standign B shoulder ER yellow band 10x3  Slight shoulder flexion position to eliminate anterior shoulder stress  Seated manually resisted scapular  retraction isometrics targeting the lower trap muscle  L 10x5 seconds   Reviewed POC: graduate PT next session to HEP.    Improved exercise technique, movement at target joints, use of target muscles after mod verbal, visual, tactile cues.    PATIENT EDUCATION:  Education details: there-ex, HEP, POC Person educated: Patient Education method: Explanation, Demonstration, Tactile cues, Verbal cues, and Handouts Education comprehension: verbalized understanding and returned demonstration  HOME EXERCISE PROGRAM: Access Code: [email or web address] URL: [email or web address] Date: 03/31/2024 Prepared by: Suzzane Estes  Exercises - Seated Cervical Retraction  - 3 x daily - 7 x weekly - 3 sets - 10 reps - 5 seconds hold - Seated Scapular Retraction  - 3 x daily - 7 x weekly - 3 sets - 10 reps - 5 seconds hold - Supine Head Nod with Deep Neck Flexor Activation  - 1 x daily - 7 x weekly - 3 sets - 10 reps - 5  seconds hold  - Isometric Tricep Extension   - 1 x daily - 7 x weekly - 3 sets - 10 reps - 5 seconds hold - Scapular Retraction with Resistance  - 1 x daily - 7 x weekly - 3 sets - 10 reps - 5 seconds  hold Yellow band    Access Code: [email or web address] URL: [email or web address] Date: 04/19/2024 Prepared by: Janine Melbourne  Exercises -  Standing Isometric Shoulder Flexion with Doorway - Arm Bent  - 1-2 x daily - 3-5 x weekly - 2 sets - 10 reps - Standing Isometric Shoulder Abduction with Doorway - Arm Bent  - 1-2 x daily - 3-5 x weekly - 2 sets - 10 reps - Standing Isometric Shoulder Internal Rotation at Doorway  - 1-2 x daily - 3-5 x weekly - 2 sets - 10 reps - Standing Isometric Shoulder External Rotation with Doorway  - 1-2 x daily - 3-5 x weekly - 2 sets - 10 reps   ASSESSMENT:  CLINICAL IMPRESSION:   Pt demonstates improved L shoulder and arm pain, ER strength, and cervical rotation since initial evaluation.  Continued working on promoting proper scapular posture, mechanics, and B scapular strengthening to decrease pressure to neck and L shoulder joint. Pt tolerated session well without aggravation of symptoms. Pt will benefit from continued skilled physical therapy services to decrease pain, improve strength and function.      OBJECTIVE IMPAIRMENTS: decreased ROM, decreased strength, impaired UE functional use, improper body mechanics, postural dysfunction, and pain.   ACTIVITY LIMITATIONS: carrying, lifting, and reach over head  PARTICIPATION LIMITATIONS:   PERSONAL FACTORS: Age, Fitness, and 1 comorbidity: HTN, hx of CA are also affecting patient's functional outcome.   REHAB POTENTIAL: Fair    CLINICAL DECISION MAKING: Stable/uncomplicated pain has improved since onset  EVALUATION COMPLEXITY: Low   GOALS: Goals reviewed with patient? Yes  SHORT TERM GOALS: Target date: 04/14/2024  Pt will be independent with her initial HEP to decrease L shoulder and neck pain, improve strength, function, and ability to reach and turn her head to the L more comfortably.  Baseline: Pt has started her initial HEP (03/31/2024); No questions with her HEP (05/03/2024) Goal status: MET    LONG TERM GOALS: Target date: 05/26/2024  Pt will have a decrease in L arm pain to 4/10 or less at worst to promote ability to lift as well  as reach more comfortably. Baseline: at least 7/10 at worst for the past 3 months (4/11/20025); 3/10 at most for the past 7 days (05/03/2024) Goal status: MET  2.  Pt will improve her L lower trap and ER muscle strength by at least 1/2 MMT grade to promote ability to reach with her L arm more comfortably.  Baseline:  MMT Right eval Left eval L  (05/17/2024)  Shoulder external rotation  4 with L lateral deltoid pain 4+  Lower trapezius (upright manually resisted) 4 4- 4-   (03/31/2024)  Goal status: PARTIALLY MET  3.  Pt will improve her L cervical AROM to 60 degrees without pain to promote ability to look around more comfortably.  Baseline:  Active ROM A/PROM (deg) eval AROM (05/17/2024  Right rotation 80 75 degrees  Left rotation 35 degrees with neck pain at L upper trap/C5 dermatome area, and L lateral neck muscle 75 degrees with L upper trap area pulling.    (03/31/2024)  Goal status: MET  4.  Patient will improve her UE functional Index (UEFI) sore by at least 8 points as a demonstration of improved function Baseline: UEFI score: 48/80 (03/31/2024); 61/80 (05/17/2024) Goal status: MET   PLAN:  PT FREQUENCY: 1-2x/week  PT DURATION: 8 weeks  PLANNED INTERVENTIONS: 97110-Therapeutic exercises, 97530- Therapeutic activity, 97112- Neuromuscular re-education, 97535- Self Care, 16109- Manual therapy, G0283- Electrical stimulation (unattended), [phone]- Ionotophoresis 4mg /ml Dexamethasone , and Patient/Family education  PLAN FOR NEXT SESSION: posture, cervical retraction, scapular strengthening, thoracic extension, glenohumeral mechanics, ER muscle strengthening, manual techniques, modalities PRN   Akiya Morr, PT, DPT 05/17/2024, 4:36 PM

## 2024-05-18 ENCOUNTER — Ambulatory Visit

## 2024-05-18 DIAGNOSIS — M79602 Pain in left arm: Secondary | ICD-10-CM

## 2024-05-18 DIAGNOSIS — M25512 Pain in left shoulder: Secondary | ICD-10-CM

## 2024-05-18 DIAGNOSIS — M542 Cervicalgia: Secondary | ICD-10-CM | POA: Diagnosis not present

## 2024-05-18 NOTE — Therapy (Signed)
 OUTPATIENT PHYSICAL THERAPY TREATMENT And Discharge Summary   Patient Name: Jamie Elliott MRN: [id number] DOB:[birth year], 78 y.o., female Today's Date: 05/18/2024  END OF SESSION:  PT End of Session - 05/18/24 1348     Visit Number 9    Number of Visits 17    Date for PT Re-Evaluation 05/26/24    PT Start Time 1348    PT Stop Time 1428    PT Time Calculation (min) 40 min    Activity Tolerance Patient tolerated treatment well    Behavior During Therapy [facility] for tasks assessed/performed                     Past Medical History:  Diagnosis Date   Allergy 1970's.   Blood transfusion without reported diagnosis 1983   Cancer (HCC) 02-2023   Cataract 2023   Environmental allergies    Hypercholesterolemia    Hypertension    Migraines    Past Surgical History:  Procedure Laterality Date   ABDOMINAL HYSTERECTOMY  12/21/1998   APPENDECTOMY  12/21/1998   BREAST SURGERY  05/2023   at [facility] lumpectomy   CATARACT EXTRACTION W/PHACO Left 04/23/2021   Procedure: CATARACT EXTRACTION PHACO AND INTRAOCULAR LENS PLACEMENT (IOC) LEFT panoptix toric 10.41 01:22.8 12.6%;  Surgeon: Annell Kidney, MD;  Location: [facility];  Service: Ophthalmology;  Laterality: Left;   CATARACT EXTRACTION W/PHACO Right 05/07/2021   Procedure: CATARACT EXTRACTION PHACO AND INTRAOCULAR LENS PLACEMENT (IOC) RIGHT  panoptix 12.85 01:33.8 13.7%;  Surgeon: Annell Kidney, MD;  Location: [facility];  Service: Ophthalmology;  Laterality: Right;   NOSE SURGERY  12/21/1965   SEPTOPLASTY     SHOULDER ARTHROSCOPY WITH OPEN ROTATOR CUFF REPAIR Right 12/05/2015   Procedure: SHOULDER ARTHROSCOPY WITH MINI OPEN ROTATOR CUFF REPAIR. DISTAL CLAVICLE EXCISION;  Surgeon: Rande Bushy, MD;  Location: [facility];  Service: Orthopedics;  Laterality: Right;   Patient Active Problem List   Diagnosis Date Noted   Cystitis 04/14/2024   Urinary frequency 04/14/2024   Neck pain 04/02/2024    Skin mole 04/02/2024   Toe pain 04/02/2024   Sprain of left upper arm 03/01/2024   Rash 12/12/2023   Genetic testing 05/24/2023   Carcinoma of upper-outer quadrant of left breast in female, estrogen receptor positive (HCC) 03/22/2023   Viral URI 03/15/2023   Headache 01/14/2023   Glossitis 05/02/2022   Aphthous ulcer 04/11/2022   Leukocytosis 04/07/2022   Viral syndrome 03/28/2022   Respiratory tract infection 03/17/2022   Nausea and vomiting 03/17/2022   Vitamin D  deficiency 12/11/2021   Mouth sores 10/19/2021   Abnormal mammogram 06/26/2021   History of COVID-19 06/18/2021   Bleeding 03/15/2021   Left foot pain 03/04/2021   Fatigue 01/05/2021   Angioleiomyoma 11/18/2020   Nasal drainage 08/12/2020   Stress 04/22/2020   Sinus congestion 11/08/2019   Allergy 07/02/2019   Low back pain 12/11/2018   Left arm pain 07/24/2018   Right hip pain 05/24/2018   Left leg swelling 09/03/2017   Chest pressure 06/16/2017   Altered sensation of foot 06/16/2017   Recurrent UTI 05/12/2017   Constipation 02/16/2016   UTI (urinary tract infection) 01/06/2016   Pain in joint, shoulder region 12/05/2015   Loss of weight 11/25/2015   History of abnormal mammogram 08/26/2015   Health care maintenance 05/19/2015   Osteoporosis 04/17/2014   Abnormal liver function tests 12/17/2013   HTN (hypertension) 12/04/2012   Hypercholesterolemia 12/04/2012   Environmental allergies 12/04/2012    PCP: Geralyn Knee,  [place], MD  REFERRING PROVIDER: Dellar Fenton, MD  REFERRING DIAG: [phone] (ICD-10-CM) - Left arm pain  THERAPY DIAG:  Acute pain of left shoulder  Pain in left arm  Cervicalgia  Rationale for Evaluation and Treatment: Rehabilitation  ONSET DATE: 3 weeks ago from 03/31/2024  SUBJECTIVE:                                                                                                                                                                                                          SUBJECTIVE STATEMENT:   L shoulder and L arm are pretty good. Feels L anterior shoulder popping at times when bringing her L arm down after raising it up such as when getting dressed.        Hand dominance: Left  PERTINENT HISTORY:   L arm pain. Pain began at least 3 weeks ago. Pt lifted a heavy teapot of water and felt a pop in her L shoulder. Pain is a lot better since onset. Pt is also taking exercises for her L UE 3x/week at night. Pt has also had a crick in her neck for more than 3 days which has been bothering her more. Pt was told ehtat her arm is a sprain. Pt also had tingling B hand and arm (along the C5-C6 dermatome)   Blood pressure is controlled.  No latex allergies  PAIN:  Are you having pain? Yes: NPRS scale:   Pain location: L anterior shoulder and arm Pain description: unable to describe Aggravating factors: supination, pronation, raising her arm Relieving factors: rest  PRECAUTIONS: Osteoporosis, Hx of CA  RED FLAGS: None     WEIGHT BEARING RESTRICTIONS: No  FALLS:  Has patient fallen in last 6 months? No  LIVING ENVIRONMENT: Lives with: lives with their spouse   OCCUPATION: retired  PLOF: Independent  PATIENT GOALS: TP be able to do her exercises without worrying about it.   NEXT MD VISIT: yes (pt does not know)  OBJECTIVE:  Note: Objective measures were completed at Evaluation unless otherwise noted.  DIAGNOSTIC FINDINGS:     PATIENT SURVEYS:  UEFI score: 48/80 (03/31/2024)  COGNITION: Overall cognitive status: Within functional limits for tasks assessed  SENSATION:   POSTURE: forward neck, B protracted shoulders, L lateral shift neck, kyphosis, L scapular protraction > R   PALPATION:    CERVICAL ROM:   Active ROM A/PROM (deg) eval  Flexion full  Extension Full with L posterior lateral neck pain  Right lateral flexion Limited with L posterior lateral neck pull  Left lateral flexion Very limited  with R posterior lateral  neck pull  Right rotation 80  Left rotation 35 degrees with neck pain at L upper trap/C5 dermatome area, and L lateral neck muscle   (Blank rows = not tested)  UPPER EXTREMITY ROM:  Active ROM Right eval Left eval  Shoulder flexion 161 156 (175 AAROM)  Shoulder extension    Shoulder abduction 175 177  Shoulder adduction    Shoulder extension    Shoulder internal rotation (functional) R index finger to L inferior scapular angle Full no pain  Shoulder external rotation (functional) full Full, slight pain  Elbow flexion    Elbow extension    Wrist flexion    Wrist extension    Wrist ulnar deviation    Wrist radial deviation    Wrist pronation    Wrist supination     (Blank rows = not tested)  UPPER EXTREMITY MMT:  MMT Right eval Left eval  Shoulder flexion  4+  Shoulder extension    Shoulder abduction  4+ (able to perform with proper scapular position)  Shoulder adduction    Shoulder extension    Shoulder internal rotation  4+  Shoulder external rotation  4 with L lateral deltoid pain  Middle trapezius    Lower trapezius (upright manually resisted) 4 4-  Elbow flexion  4+  Elbow extension  4  Wrist flexion    Wrist extension    Wrist ulnar deviation    Wrist radial deviation    Wrist pronation    Wrist supination    Grip strength     (Blank rows = not tested)  CERVICAL SPECIAL TESTS:    FUNCTIONAL TESTS:  Decreased pain with return from L shoulder abduction with addition of scapular retraction.    Decreased pain with L cervical rotation with proper cervical posture (chin tuck)      (+) Hawkins-kennedy L shoulder   (+) empty can test     No L biceps pain with isometric, concentric and eccentric contraction  TREATMENT DATE: 05/18/24                                                                                                                                Neuromuscular re education  L shoulder scaption with scapular retraction 8x  Rubbing L biceps  tendon palpated.   Standing shoulder ER B with yellow band, pain free range 10x3  L shoulder scaption with scapular retraction 10x  No L shoulder discomfort after ER strengthening   Standing L shoulder extension yellow band attached to top of door 10x3 with scapular retraction with 5 second holds  Standing L scaption 8x. L anterior shoulder biceps tendon symptoms.    Standing shoulder ER B with yellow band, pain free range 10x3  Standing L scapion 5x. No L anterior shoulder/biceps tendon symptoms  Seated manually resisted scapular retraction isometrics targeting the lower trap muscle  L 10x5 seconds for 3 sets  Pt states her  muscles feel stronger.     Improved exercise technique, movement at target joints, use of target muscles after mod verbal, visual, tactile cues.    Manual therapy  Seated STM B upper trap muscles to decrease tension to shoulder and neck     PATIENT EDUCATION:  Education details: there-ex, HEP, POC Person educated: Patient Education method: Explanation, Demonstration, Tactile cues, Verbal cues, and Handouts Education comprehension: verbalized understanding and returned demonstration  HOME EXERCISE PROGRAM: Access Code: [email or web address] URL: [email or web address] Date: 03/31/2024 Prepared by: Suzzane Estes  Exercises - Seated Cervical Retraction  - 3 x daily - 7 x weekly - 3 sets - 10 reps - 5 seconds hold - Seated Scapular Retraction  - 3 x daily - 7 x weekly - 3 sets - 10 reps - 5 seconds hold - Supine Head Nod with Deep Neck Flexor Activation  - 1 x daily - 7 x weekly - 3 sets - 10 reps - 5  seconds hold  - Isometric Tricep Extension   - 1 x daily - 7 x weekly - 3 sets - 10 reps - 5 seconds hold - Scapular Retraction with Resistance  - 1 x daily - 7 x weekly - 3 sets - 10 reps - 5 seconds  hold Yellow band  - Standing Shoulder External Rotation with Resistance  - 1 x daily - 7 x weekly - 3 sets - 10 reps  Yellow band    Access Code:  [email or web address] URL: [email or web address] Date: 04/19/2024 Prepared by: Janine Melbourne  Exercises - Standing Isometric Shoulder Flexion with Doorway - Arm Bent  - 1-2 x daily - 3-5 x weekly - 2 sets - 10 reps - Standing Isometric Shoulder Abduction with Doorway - Arm Bent  - 1-2 x daily - 3-5 x weekly - 2 sets - 10 reps - Standing Isometric Shoulder Internal Rotation at Doorway  - 1-2 x daily - 3-5 x weekly - 2 sets - 10 reps - Standing Isometric Shoulder External Rotation with Doorway  - 1-2 x daily - 3-5 x weekly - 2 sets - 10 reps   ASSESSMENT:  CLINICAL IMPRESSION:   Pt demonstates improved L shoulder and arm pain, ER and lower trap strength, function, and cervical rotation since initial evaluation.  Pt has achieved all goals and demonstrates independence with consistency with her HEP. Skilled physical therapy services discharged with pt continuing progress with her exercises at home.        OBJECTIVE IMPAIRMENTS: decreased ROM, decreased strength, impaired UE functional use, improper body mechanics, postural dysfunction, and pain.   ACTIVITY LIMITATIONS: carrying, lifting, and reach over head  PARTICIPATION LIMITATIONS:   PERSONAL FACTORS: Age, Fitness, and 1 comorbidity: HTN, hx of CA are also affecting patient's functional outcome.   REHAB POTENTIAL: Fair    CLINICAL DECISION MAKING: Stable/uncomplicated pain has improved since onset  EVALUATION COMPLEXITY: Low   GOALS: Goals reviewed with patient? Yes  SHORT TERM GOALS: Target date: 04/14/2024  Pt will be independent with her initial HEP to decrease L shoulder and neck pain, improve strength, function, and ability to reach and turn her head to the L more comfortably.  Baseline: Pt has started her initial HEP (03/31/2024); No questions with her HEP (05/03/2024) Goal status: MET    LONG TERM GOALS: Target date: 05/26/2024  Pt will have a decrease in L arm pain to 4/10 or less at worst to promote ability to  lift as well as reach more comfortably. Baseline: at least 7/10 at worst for  the past 3 months (4/11/20025); 3/10 at most for the past 7 days (05/03/2024) Goal status: MET  2.  Pt will improve her L lower trap and ER muscle strength by at least 1/2 MMT grade to promote ability to reach with her L arm more comfortably.  Baseline:  MMT Right eval Left eval L  (05/17/2024) L  (05/18/2024)  Shoulder external rotation  4 with L lateral deltoid pain 4+   Lower trapezius (upright manually resisted) 4 4- 4- 4   (03/31/2024)  Goal status: MET  3.  Pt will improve her L cervical AROM to 60 degrees without pain to promote ability to look around more comfortably.  Baseline:  Active ROM A/PROM (deg) eval AROM (05/17/2024  Right rotation 80 75 degrees  Left rotation 35 degrees with neck pain at L upper trap/C5 dermatome area, and L lateral neck muscle 75 degrees with L upper trap area pulling.    (03/31/2024)  Goal status: MET  4.  Patient will improve her UE functional Index (UEFI) sore by at least 8 points as a demonstration of improved function Baseline: UEFI score: 48/80 (03/31/2024); 61/80 (05/17/2024) Goal status: MET   PLAN:  PT FREQUENCY: 1-2x/week  PT DURATION: 8 weeks  PLANNED INTERVENTIONS: 97110-Therapeutic exercises, 97530- Therapeutic activity, 97112- Neuromuscular re-education, 97535- Self Care, 40981- Manual therapy, G0283- Electrical stimulation (unattended), [phone]- Ionotophoresis 4mg /ml Dexamethasone , and Patient/Family education  PLAN FOR NEXT SESSION: posture, cervical retraction, scapular strengthening, thoracic extension, glenohumeral mechanics, ER muscle strengthening, manual techniques, modalities PRN  Thank you for your referral.  Burnett Lieber, PT, DPT 05/18/2024, 4:16 PM

## 2024-05-21 ENCOUNTER — Encounter: Payer: Self-pay | Admitting: Internal Medicine

## 2024-05-21 NOTE — Assessment & Plan Note (Signed)
 Neck and left arm pain. Continue PT.

## 2024-05-21 NOTE — Assessment & Plan Note (Signed)
 Check urine to confirm infection cleared. Wait for culture results.

## 2024-05-21 NOTE — Assessment & Plan Note (Signed)
Overall appears to be handling things relatively well.  Follow.   

## 2024-05-21 NOTE — Assessment & Plan Note (Signed)
 Saw urology 06/2023  - Not visualized on recent RUS, so we can discontinue screening - per urology. Did recommend f/u in one year.

## 2024-05-21 NOTE — Assessment & Plan Note (Signed)
 Liver panel 03/24/24 wnl.

## 2024-05-21 NOTE — Assessment & Plan Note (Signed)
 Reclast infusion 03/28/24.

## 2024-05-21 NOTE — Assessment & Plan Note (Signed)
 Continue amlodipine and hctz. Follow pressures. Follow metabolic panel. No changes today.

## 2024-05-21 NOTE — Assessment & Plan Note (Signed)
 Left breast stage I breast cancer ER/PR positive HER2 negative [s/p lumpectomy [facility]; Dr.Hwang] ; pT1b pSnLNx-M0- G-2. S/p left breast partial mastectomy 04/22/23. Had to go back for f/u surgery 05/20/23.  XRT.  Incision - closed.  Started anastrozole . Previously breast lesion - resolved.  Continue f/u with oncology.

## 2024-05-21 NOTE — Assessment & Plan Note (Signed)
 Continue lipitor.  Low cholesterol diet and exercise.  Follow lipid panel and liver function tests. No changes in medication today.

## 2024-05-29 ENCOUNTER — Ambulatory Visit: Payer: Medicare Other | Admitting: Internal Medicine

## 2024-06-07 DIAGNOSIS — Z79811 Long term (current) use of aromatase inhibitors: Secondary | ICD-10-CM | POA: Diagnosis not present

## 2024-06-07 DIAGNOSIS — R928 Other abnormal and inconclusive findings on diagnostic imaging of breast: Secondary | ICD-10-CM | POA: Diagnosis not present

## 2024-06-07 DIAGNOSIS — C50412 Malignant neoplasm of upper-outer quadrant of left female breast: Secondary | ICD-10-CM | POA: Diagnosis not present

## 2024-06-07 DIAGNOSIS — M810 Age-related osteoporosis without current pathological fracture: Secondary | ICD-10-CM | POA: Diagnosis not present

## 2024-06-07 DIAGNOSIS — Z9889 Other specified postprocedural states: Secondary | ICD-10-CM | POA: Diagnosis not present

## 2024-06-07 DIAGNOSIS — Z170 Estrogen receptor positive status [ER+]: Secondary | ICD-10-CM | POA: Diagnosis not present

## 2024-06-13 ENCOUNTER — Inpatient Hospital Stay: Attending: Internal Medicine | Admitting: Internal Medicine

## 2024-06-13 ENCOUNTER — Encounter: Payer: Self-pay | Admitting: Internal Medicine

## 2024-06-13 VITALS — BP 104/90 | HR 84 | Temp 97.4°F | Ht 60.0 in | Wt 125.9 lb

## 2024-06-13 DIAGNOSIS — Z79811 Long term (current) use of aromatase inhibitors: Secondary | ICD-10-CM | POA: Insufficient documentation

## 2024-06-13 DIAGNOSIS — Z170 Estrogen receptor positive status [ER+]: Secondary | ICD-10-CM | POA: Insufficient documentation

## 2024-06-13 DIAGNOSIS — C50412 Malignant neoplasm of upper-outer quadrant of left female breast: Secondary | ICD-10-CM | POA: Diagnosis not present

## 2024-06-13 DIAGNOSIS — Z801 Family history of malignant neoplasm of trachea, bronchus and lung: Secondary | ICD-10-CM | POA: Diagnosis not present

## 2024-06-13 DIAGNOSIS — M810 Age-related osteoporosis without current pathological fracture: Secondary | ICD-10-CM | POA: Diagnosis not present

## 2024-06-13 DIAGNOSIS — Z803 Family history of malignant neoplasm of breast: Secondary | ICD-10-CM | POA: Diagnosis not present

## 2024-06-13 MED ORDER — ANASTROZOLE 1 MG PO TABS: 1.0000 mg | ORAL_TABLET | Freq: Every day | ORAL | 1 refills | Status: DC

## 2024-06-13 NOTE — Progress Notes (Signed)
 Mammogram done at [facility] 06/07/24.

## 2024-06-13 NOTE — Progress Notes (Signed)
 [facility] CONSULT NOTE  Patient Care Team: Glendia Shad, MD as PCP - General (Internal Medicine) Georgina Shasta POUR, RN as Oncology Nurse Navigator Rennie Cindy SAUNDERS, MD as Consulting Physician (Oncology)  CHIEF COMPLAINTS/PURPOSE OF CONSULTATION: Breast cancer  #  Oncology History Overview Note   [facility]- MARCH 2024-  There is a subtle 0.8 cm x 0.6 cm x 0.5 cm  irregularly shaped,  Invasive adenocarcinoma of the breast (7 mm on core). Histologic type: Ductal with lobular features Provisional Nottingham combined histologic grade: 2 of 3 Tubule formation score: 3 Nuclear pleomorphism score: 2 Mitotic rate score:  1   Ductal carcinoma in situ (DCIS): Present Type of in-situ carcinoma: Solid and cribriform Nuclear grade of in-situ carcinoma: 2  MARGINS   Margin Status for Invasive Carcinoma:    All margins negative for invasive carcinoma     Distance from Invasive Carcinoma to Closest Margin:    Less than: 1 mm     Closest Margin(s) to Invasive Carcinoma:    Medial   Margin Status for DCIS:    All margins negative for DCIS     Distance from DCIS to Closest Margin:    Greater than: 5 mm     Closest Margin(s) to DCIS:    all margins   REGIONAL LYMPH NODES   Regional Lymph Node Status:    Not applicable (no regional lymph nodes submitted or found)     pT Category:    pT1b   pN Category:    pN not assigned (no nodes submitted or found)   SPECIAL STUDIES     Estrogen Receptor (ER) Status:    Positive (greater than 10% of cells demonstrate nuclear positivity)       Percentage of Cells with Nuclear Positivity:    99 %     Progesterone Receptor (PgR) Status:    Positive       Percentage of Cells with Nuclear Positivity:    64 %     HER2 (by immunohistochemistry):    Negative (Score 1+)   # OCT 2024- plan to start adjuvant  Anastrozole  [x 5 years; till  OCT 2029]   Carcinoma of upper-outer quadrant of left breast in female, estrogen receptor positive (HCC)  03/22/2023  Initial Diagnosis   Carcinoma of upper-outer quadrant of left breast in female, estrogen receptor positive   03/22/2023 Cancer Staging   Staging form: Breast, AJCC 8th Edition - Clinical: Stage IA (cT1b, cN0, cM0, G2, ER+, PR+, HER2-) - Signed by Rennie Cindy SAUNDERS, MD on 03/22/2023 Histologic grading system: 3 grade system    Genetic Testing   Negative genetic testing. No pathogenic variants identified on the [facility] Multi-Cancer+RNA panel. The report date is 05/23/2023.  The Multi-Cancer + RNA Panel offered by [facility] includes sequencing and/or deletion/duplication analysis of the following 70 genes:  AIP*, ALK, APC*, ATM*, AXIN2*, BAP1*, BARD1*, BLM*, BMPR1A*, BRCA1*, BRCA2*, BRIP1*, CDC73*, CDH1*, CDK4, CDKN1B*, CDKN2A, CHEK2*, CTNNA1*, DICER1*, EPCAM, EGFR, FH*, FLCN*, GREM1, HOXB13, KIT, LZTR1, MAX*, MBD4, MEN1*, MET, MITF, MLH1*, MSH2*, MSH3*, MSH6*, MUTYH*, NF1*, NF2*, NTHL1*, PALB2*, PDGFRA, PMS2*, POLD1*, POLE*, POT1*, PRKAR1A*, PTCH1*, PTEN*, RAD51C*, RAD51D*, RB1*, RET, SDHA*, SDHAF2*, SDHB*, SDHC*, SDHD*, SMAD4*, SMARCA4*, SMARCB1*, SMARCE1*, STK11*, SUFU*, TMEM127*, TP53*, TSC1*, TSC2*, VHL*. RNA analysis is performed for * genes.'    HISTORY OF PRESENTING ILLNESS: Patient ambulating-independently.Accompanied by family-husband.   Jamie Elliott 78 y.o.  female patient with left  breast cancer stage I ER/PR positive HER2 negative status postlumpectomy [[facility]] s/p  Radiation on adjuvant anastrazole is here for follow-up/review results of the mammogram done at [facility].  Denies any hot flashes; or joint pain or bone pain.  She had to compliance with her endocrine therapy.  Otherwise has been feeling well. Appetite and energy were good. No Breast pain or discomfort.  Otherwise denies any lumps or bumps.  Review of Systems  Constitutional:  Negative for chills, diaphoresis, fever, malaise/fatigue and weight loss.  HENT:  Negative for nosebleeds and sore throat.   Eyes:  Negative for double  vision.  Respiratory:  Negative for cough, hemoptysis, sputum production, shortness of breath and wheezing.   Cardiovascular:  Negative for chest pain, palpitations, orthopnea and leg swelling.  Gastrointestinal:  Negative for abdominal pain, blood in stool, constipation, diarrhea, heartburn, melena, nausea and vomiting.  Genitourinary:  Negative for dysuria, frequency and urgency.  Musculoskeletal:  Negative for back pain and joint pain.  Skin: Negative.  Negative for itching and rash.  Neurological:  Negative for dizziness, tingling, focal weakness, weakness and headaches.  Endo/Heme/Allergies:  Does not bruise/bleed easily.  Psychiatric/Behavioral:  Negative for depression. The patient is not nervous/anxious and does not have insomnia.      MEDICAL HISTORY:  Past Medical History:  Diagnosis Date   Allergy 1970's.   Blood transfusion without reported diagnosis 1983   Cancer (HCC) 02-2023   Cataract 2023   Environmental allergies    Hypercholesterolemia    Hypertension    Migraines     SURGICAL HISTORY: Past Surgical History:  Procedure Laterality Date   ABDOMINAL HYSTERECTOMY  12/21/1998   APPENDECTOMY  12/21/1998   BREAST SURGERY  05/2023   at [facility] lumpectomy   CATARACT EXTRACTION W/PHACO Left 04/23/2021   Procedure: CATARACT EXTRACTION PHACO AND INTRAOCULAR LENS PLACEMENT (IOC) LEFT panoptix toric 10.41 01:22.8 12.6%;  Surgeon: Mittie Gaskin, MD;  Location: [facility];  Service: Ophthalmology;  Laterality: Left;   CATARACT EXTRACTION W/PHACO Right 05/07/2021   Procedure: CATARACT EXTRACTION PHACO AND INTRAOCULAR LENS PLACEMENT (IOC) RIGHT  panoptix 12.85 01:33.8 13.7%;  Surgeon: Mittie Gaskin, MD;  Location: [facility];  Service: Ophthalmology;  Laterality: Right;   NOSE SURGERY  12/21/1965   SEPTOPLASTY     SHOULDER ARTHROSCOPY WITH OPEN ROTATOR CUFF REPAIR Right 12/05/2015   Procedure: SHOULDER ARTHROSCOPY WITH MINI OPEN ROTATOR CUFF REPAIR.  DISTAL CLAVICLE EXCISION;  Surgeon: Franky Cranker, MD;  Location: [facility];  Service: Orthopedics;  Laterality: Right;    SOCIAL HISTORY: Social History   Socioeconomic History   Marital status: Married    Spouse name: Not on file   Number of children: 2   Years of education: Not on file   Highest education level: Bachelor's degree (e.g., BA, AB, BS)  Occupational History   Not on file  Tobacco Use   Smoking status: Never   Smokeless tobacco: Never  Vaping Use   Vaping status: Never Used  Substance and Sexual Activity   Alcohol use: No    Alcohol/week: 0.0 standard drinks of alcohol   Drug use: No   Sexual activity: Not Currently  Other Topics Concern   Not on file  Social History Narrative   married   Social Drivers of Corporate investment banker Strain: Low Risk  (02/13/2024)   Received from [facility]   Overall Financial Resource Strain (CARDIA)    Difficulty of Paying Living Expenses: Not hard at all  Food Insecurity: No Food Insecurity (02/13/2024)   Received from [facility]  Hunger Vital Sign    Within the past 12 months, you worried that your food would run out before you got the money to buy more.: Never true    Within the past 12 months, the food you bought just didn't last and you didn't have money to get more.: Never true  Transportation Needs: No Transportation Needs (02/13/2024)   Received from [facility] - Transportation    In the past 12 months, has lack of transportation kept you from medical appointments or from getting medications?: No    Lack of Transportation (Non-Medical): No  Physical Activity: Sufficiently Active (01/10/2024)   Exercise Vital Sign    Days of Exercise per Week: 5 days    Minutes of Exercise per Session: 40 min  Stress: No Stress Concern Present (01/10/2024)   [employer] of Occupational Health - Occupational Stress Questionnaire    Feeling of Stress : Not at  all  Social Connections: Socially Integrated (01/10/2024)   Social Connection and Isolation Panel    Frequency of Communication with Friends and Family: More than three times a week    Frequency of Social Gatherings with Friends and Family: Twice a week    Attends Religious Services: More than 4 times per year    Active Member of [employer] or Organizations: Yes    Attends Engineer, structural: More than 4 times per year    Marital Status: Married  Catering manager Violence: Not At Risk (01/10/2024)   Humiliation, Afraid, Rape, and Kick questionnaire    Fear of Current or Ex-Partner: No    Emotionally Abused: No    Physically Abused: No    Sexually Abused: No    FAMILY HISTORY: Family History  Problem Relation Age of Onset   Heart disease Mother    Hyperlipidemia Mother    Hypertension Mother    Lung cancer Father        dx 40s   Hyperlipidemia Father    Hypertension Father    Prostate cancer Father        dx 9s   Cancer Father    Breast cancer Maternal Aunt        dx 60s-70s   Breast cancer Maternal Grandmother        dx 90s   Bladder Cancer Neg Hx    Kidney cancer Neg Hx     ALLERGIES:  is allergic to ciprofloxacin , zithromax [azithromycin], augmentin [amoxicillin-pot clavulanate], and doxycycline .  MEDICATIONS:  Current Outpatient Medications  Medication Sig Dispense Refill   acetaminophen  (TYLENOL ) 500 MG tablet Take 500 mg by mouth every 6 (six) hours as needed.     amLODipine  (NORVASC ) 5 MG tablet Take 1 tablet (5 mg total) by mouth daily. 90 tablet 3   Ascorbic Acid (VITAMIN C) 1000 MG tablet Take 1,000 mg by mouth daily.     atorvastatin  (LIPITOR) 40 MG tablet Take 1 tablet (40 mg total) by mouth daily. 90 tablet 3   cetirizine  (ZYRTEC ) 10 MG tablet Take 1 tablet (10 mg total) by mouth daily. 90 tablet 3   Cholecalciferol (VITAMIN D3) 25 MCG (1000 UT) CAPS Take 1 capsule by mouth daily.     CRANBERRY EXTRACT PO Take 1 capsule by mouth 2 (two) times daily.      D-Mannose 500 MG CAPS Take 500 mg by mouth in the morning and at bedtime.     hydrochlorothiazide  (HYDRODIURIL ) 12.5 MG tablet Take 1 tablet (12.5 mg total) by mouth daily. 90  tablet 3   ibuprofen  (ADVIL ) 600 MG tablet Take 1 tablet (600 mg total) by mouth 3 (three) times daily. (Patient taking differently: Take 600 mg by mouth 3 (three) times daily as needed.) 15 tablet 0   losartan  (COZAAR ) 100 MG tablet Take 1 tablet (100 mg total) by mouth daily. 90 tablet 3   polyethylene glycol (MIRALAX / GLYCOLAX) 17 g packet Take 17 g by mouth every other day.     Probiotic Product (ALIGN PO) Take by mouth as needed.     triamcinolone  (NASACORT ) 55 MCG/ACT nasal inhaler Place 2 sprays into the nose as needed. 1 Inhaler 5   anastrozole  (ARIMIDEX ) 1 MG tablet Take 1 tablet (1 mg total) by mouth daily. 90 tablet 1   No current facility-administered medications for this visit.     PHYSICAL EXAMINATION:   Vitals:   06/13/24 0922  BP: (!) 104/90  Pulse: 84  Temp: (!) 97.4 F (36.3 C)  SpO2: 98%       Filed Weights   06/13/24 0922  Weight: 125 lb 14.4 oz (57.1 kg)        Physical Exam Vitals and nursing note reviewed.  HENT:     Head: Normocephalic and atraumatic.     Mouth/Throat:     Pharynx: Oropharynx is clear.   Eyes:     Extraocular Movements: Extraocular movements intact.     Pupils: Pupils are equal, round, and reactive to light.    Cardiovascular:     Rate and Rhythm: Normal rate and regular rhythm.  Pulmonary:     Comments: Decreased breath sounds bilaterally.  Abdominal:     Palpations: Abdomen is soft.   Musculoskeletal:        General: Normal range of motion.     Cervical back: Normal range of motion.   Skin:    General: Skin is warm.   Neurological:     General: No focal deficit present.     Mental Status: She is alert and oriented to person, place, and time.   Psychiatric:        Behavior: Behavior normal.        Judgment: Judgment normal.       LABORATORY DATA:  I have reviewed the data as listed Lab Results  Component Value Date   WBC 5.9 11/08/2023   HGB 12.8 11/08/2023   HCT 38.4 11/08/2023   MCV 94.5 11/08/2023   PLT 226.0 11/08/2023   Recent Labs    07/01/23 0840 11/08/23 0753 03/24/24 0857  NA  --  140 141  K  --  3.6 3.7  CL  --  103 105  CO2  --  29 29  GLUCOSE  --  86 89  BUN  --  13 19  CREATININE  --  0.73 0.76  CALCIUM   --  10.1 10.1  PROT 6.2 6.3 6.4  ALBUMIN 3.9 4.0 4.2  AST 16 18 18   ALT 15 18 21   ALKPHOS 81 75 72  BILITOT 0.7 0.5 0.6  BILIDIR 0.1 0.1 0.1    RADIOGRAPHIC STUDIES: I have personally reviewed the radiological images as listed and agreed with the findings in the report. No results found.  ASSESSMENT & PLAN:   Carcinoma of upper-outer quadrant of left breast in female, estrogen receptor positive (HCC) # Left breast stage I breast cancer ER/PR positive HER2 negative [s/p lumpectomy [facility]; Dr.Hwang] ; pT1b pSnLNx-M0- G-2. S/p re-excision [[facility]]- s/p radiation at [facility] on 7/29/ 2024.  JUNE 2024- RS-23;  recurrence < 9. [facility]- No mammographic evidence of malignancy in either breast.  Postlumpectomy  changes are seen in the left breast; Mammo Diagnostic in 12 Months is recommended for both breasts (per  lumpectomy protocol).   # currently on adjuvant  Anastrozole  [x 5 years; till AUG 2029]- tolerating well.refilled-  No side effects noted.   # OSTEOPOROSIS-AUG 2024- BMD [Dr.Scott; ]  T-score of -2.6 Currently  OFF Fosamax  [GERD] ; Reclast infusion 03/28/24. - Dr.Scott- stable   # DISPOSITION: # follow up in 7 [jan] months; MD; No labs-Dr.B  All questions were answered. The patient/family knows to call the clinic with any problems, questions or concerns.    Cindy JONELLE Joe, MD 06/13/2024 10:09 AM

## 2024-06-13 NOTE — Assessment & Plan Note (Addendum)
#   Left breast stage I breast cancer ER/PR positive HER2 negative [s/p lumpectomy [facility]; Dr.Hwang] ; pT1b pSnLNx-M0- G-2. S/p re-excision [[facility]]- s/p radiation at [facility] on 7/29/ 2024.  JUNE 2024- RS-23; recurrence < 9. [facility]- No mammographic evidence of malignancy in either breast.  Postlumpectomy  changes are seen in the left breast; Mammo Diagnostic in 12 Months is recommended for both breasts (per  lumpectomy protocol).   # currently on adjuvant  Anastrozole  [x 5 years; till AUG 2029]- tolerating well.refilled-  No side effects noted.   # OSTEOPOROSIS-AUG 2024- BMD [Dr.Scott; ]  T-score of -2.6 Currently  OFF Fosamax  [GERD] ; Reclast infusion 03/28/24. - Dr.Scott- stable   # DISPOSITION: # follow up in 7 [jan] months; MD; No labs-Dr.B

## 2024-06-14 ENCOUNTER — Encounter

## 2024-06-16 ENCOUNTER — Encounter

## 2024-06-19 ENCOUNTER — Other Ambulatory Visit: Payer: Self-pay | Admitting: Internal Medicine

## 2024-06-21 ENCOUNTER — Encounter

## 2024-06-30 ENCOUNTER — Encounter

## 2024-07-03 DIAGNOSIS — D2262 Melanocytic nevi of left upper limb, including shoulder: Secondary | ICD-10-CM | POA: Diagnosis not present

## 2024-07-03 DIAGNOSIS — L565 Disseminated superficial actinic porokeratosis (DSAP): Secondary | ICD-10-CM | POA: Diagnosis not present

## 2024-07-03 DIAGNOSIS — D2271 Melanocytic nevi of right lower limb, including hip: Secondary | ICD-10-CM | POA: Diagnosis not present

## 2024-07-03 DIAGNOSIS — D2261 Melanocytic nevi of right upper limb, including shoulder: Secondary | ICD-10-CM | POA: Diagnosis not present

## 2024-07-03 DIAGNOSIS — D2272 Melanocytic nevi of left lower limb, including hip: Secondary | ICD-10-CM | POA: Diagnosis not present

## 2024-07-03 DIAGNOSIS — L570 Actinic keratosis: Secondary | ICD-10-CM | POA: Diagnosis not present

## 2024-07-03 DIAGNOSIS — L2989 Other pruritus: Secondary | ICD-10-CM | POA: Diagnosis not present

## 2024-07-04 NOTE — Progress Notes (Unsigned)
 07/05/2024 9:10 AM   Jamie Elliott 12-Feb-1946 [id number]  Referring provider: Glendia Shad, MD [address] [place],  [place] [address]  Urological history: 1. rUTI's -KUB (2019) constipation -cystoscopy (2019) subtle trigonitis   2. Angiomyolipoma -RUS (2021) ~ 2 cm cyst in the left kidney and a ~ 1 cm hyperechoic lesion in the right likely an angiomyolipoma -RUS (2022) - Stable 0.8 cm hyperechoic structure within the right renal pelvis, which could reflect a nonshadowing calculus or small angiomyolipoma.  Simple left renal cyst. -contrast CT, 02/2022 - simple appearing bilateral renal cyst -RUS (2024) - no angiomyolipoma's visualized  3. GSM -diagnosed with breast cancer (03/2023)   Chief Complaint  Patient presents with   Follow-up   HPI: Jamie Elliott is a 78 y.o. woman who presents today for yearly follow up.  Previous records reviewed.    She feels well today.  She had a follow-up for her breast cancer and that appointment went well.  She has no issues with incontinence.   She has no OAB symptoms.  Her last UTI was in April.  She is taking d-mannose and drinking plenty of water and that has seemed to help keep her infections at bay.  Patient denies any modifying or aggravating factors.  Patient denies any recent UTI's, gross hematuria, dysuria or suprapubic/flank pain.  Patient denies any fevers, chills, nausea or vomiting.     PMH: Past Medical History:  Diagnosis Date   Allergy 1970's.   Blood transfusion without reported diagnosis 1983   Cancer (HCC) 02-2023   Cataract 2023   Environmental allergies    Hypercholesterolemia    Hypertension    Migraines     Surgical History: Past Surgical History:  Procedure Laterality Date   ABDOMINAL HYSTERECTOMY  12/21/1998   APPENDECTOMY  12/21/1998   BREAST SURGERY  05/2023   at [facility] lumpectomy   CATARACT EXTRACTION W/PHACO Left 04/23/2021   Procedure: CATARACT EXTRACTION PHACO AND  INTRAOCULAR LENS PLACEMENT (IOC) LEFT panoptix toric 10.41 01:22.8 12.6%;  Surgeon: Mittie Gaskin, MD;  Location: [facility];  Service: Ophthalmology;  Laterality: Left;   CATARACT EXTRACTION W/PHACO Right 05/07/2021   Procedure: CATARACT EXTRACTION PHACO AND INTRAOCULAR LENS PLACEMENT (IOC) RIGHT  panoptix 12.85 01:33.8 13.7%;  Surgeon: Mittie Gaskin, MD;  Location: [facility];  Service: Ophthalmology;  Laterality: Right;   NOSE SURGERY  12/21/1965   SEPTOPLASTY     SHOULDER ARTHROSCOPY WITH OPEN ROTATOR CUFF REPAIR Right 12/05/2015   Procedure: SHOULDER ARTHROSCOPY WITH MINI OPEN ROTATOR CUFF REPAIR. DISTAL CLAVICLE EXCISION;  Surgeon: Franky Cranker, MD;  Location: [facility];  Service: Orthopedics;  Laterality: Right;    Home Medications:  Current Outpatient Medications on File Prior to Visit  Medication Sig Dispense Refill   acetaminophen  (TYLENOL ) 500 MG tablet Take 500 mg by mouth every 6 (six) hours as needed.     amLODipine  (NORVASC ) 5 MG tablet Take 1 tablet (5 mg total) by mouth daily. 90 tablet 3   anastrozole  (ARIMIDEX ) 1 MG tablet Take 1 tablet (1 mg total) by mouth daily. 90 tablet 1   Ascorbic Acid (VITAMIN C) 1000 MG tablet Take 1,000 mg by mouth daily.     atorvastatin  (LIPITOR) 40 MG tablet Take 1 tablet (40 mg total) by mouth daily. 90 tablet 3   cetirizine  (ZYRTEC ) 10 MG tablet Take 1 tablet (10 mg total) by mouth daily. 90 tablet 0   Cholecalciferol (VITAMIN D3) 25 MCG (1000 UT) CAPS Take 1  capsule by mouth daily.     CRANBERRY EXTRACT PO Take 1 capsule by mouth 2 (two) times daily.     D-Mannose 500 MG CAPS Take 500 mg by mouth in the morning and at bedtime.     hydrochlorothiazide  (HYDRODIURIL ) 12.5 MG tablet Take 1 tablet (12.5 mg total) by mouth daily. 90 tablet 3   ibuprofen  (ADVIL ) 600 MG tablet Take 1 tablet (600 mg total) by mouth 3 (three) times daily. (Patient taking differently: Take 600 mg by mouth 3 (three) times daily as  needed.) 15 tablet 0   losartan  (COZAAR ) 100 MG tablet Take 1 tablet (100 mg total) by mouth daily. 90 tablet 3   polyethylene glycol (MIRALAX / GLYCOLAX) 17 g packet Take 17 g by mouth every other day.     Probiotic Product (ALIGN PO) Take by mouth as needed.     triamcinolone  (NASACORT ) 55 MCG/ACT nasal inhaler Place 2 sprays into the nose as needed. 1 Inhaler 5   No current facility-administered medications on file prior to visit.    Allergies:  Allergies  Allergen Reactions   Ciprofloxacin  Other (See Comments)    Patient reports it doesn't work for me. Last culture was sensitive, however.    Zithromax [Azithromycin]    Augmentin [Amoxicillin-Pot Clavulanate] Rash   Doxycycline  Nausea And Vomiting    Sensitive on her stomach    Family History: Family History  Problem Relation Age of Onset   Heart disease Mother    Hyperlipidemia Mother    Hypertension Mother    Lung cancer Father        dx 38s   Hyperlipidemia Father    Hypertension Father    Prostate cancer Father        dx 4s   Cancer Father    Breast cancer Maternal Aunt        dx 60s-70s   Breast cancer Maternal Grandmother        dx 90s   Bladder Cancer Neg Hx    Kidney cancer Neg Hx     Social History:  reports that she has never smoked. She has never used smokeless tobacco. She reports that she does not drink alcohol and does not use drugs.  ROS: Pertinent ROS in HPI  Physical Exam: BP 117/71   Pulse 61   Ht 5' (1.524 m)   Wt 125 lb (56.7 kg)   LMP 11/28/1998   BMI 24.41 kg/m   Constitutional:  Well nourished. Alert and oriented, No acute distress. HEENT: [place] AT, moist mucus membranes.  Trachea midline Cardiovascular: No clubbing, cyanosis, or edema. Respiratory: Normal respiratory effort, no increased work of breathing. Neurologic: Grossly intact, no focal deficits, moving all 4 extremities. Psychiatric: Normal mood and affect.    Laboratory Data:    Latest Ref Rng & Units 03/24/2024    8:57  AM 11/08/2023    7:53 AM 05/31/2023   10:13 AM  BMP  Glucose 70 - 99 mg/dL 89  86  86   BUN 6 - 23 mg/dL 19  13  14    Creatinine 0.40 - 1.20 mg/dL 9.23  9.26  9.27   Sodium 135 - 145 mEq/L 141  140  141   Potassium 3.5 - 5.1 mEq/L 3.7  3.6  3.6   Chloride 96 - 112 mEq/L 105  103  104   CO2 19 - 32 mEq/L 29  29  29    Calcium  8.4 - 10.5 mg/dL 89.8  89.8  9.8  Legend: ! Abnormal I have reviewed the labs.   Pertinent Imaging: N/A  Assessment & Plan:    1. rUTI's - She has had 1 documented UTI since she was last seen - She will continue d-mannose and drinking a good amount of water  2. Vaginal atrophy - Patient not a candidate for vaginal estrogen cream with recent diagnosis of breast cancer  3. Renal cysts -Benign, no further lesions warranted   4. Nephrolithiasis - RUS (04/2023) demonstrated a possible 7.6 mm stone in the right kidney - Will follow-up with KUB next year  Return in about 1 year (around 07/05/2025) for KUB and office visit .  These notes generated with voice recognition software. I apologize for typographical errors.  Jamie Elliott  [facility] [address]  [address] [place], [place] [address] [phone]

## 2024-07-05 ENCOUNTER — Other Ambulatory Visit (INDEPENDENT_AMBULATORY_CARE_PROVIDER_SITE_OTHER)

## 2024-07-05 ENCOUNTER — Ambulatory Visit (INDEPENDENT_AMBULATORY_CARE_PROVIDER_SITE_OTHER): Payer: Medicare Other | Admitting: Urology

## 2024-07-05 ENCOUNTER — Encounter: Payer: Self-pay | Admitting: Urology

## 2024-07-05 VITALS — BP 117/71 | HR 61 | Ht 60.0 in | Wt 125.0 lb

## 2024-07-05 DIAGNOSIS — N390 Urinary tract infection, site not specified: Secondary | ICD-10-CM | POA: Diagnosis not present

## 2024-07-05 DIAGNOSIS — N200 Calculus of kidney: Secondary | ICD-10-CM

## 2024-07-05 DIAGNOSIS — E7800 Pure hypercholesterolemia, unspecified: Secondary | ICD-10-CM | POA: Diagnosis not present

## 2024-07-05 LAB — LIPID PANEL
Cholesterol: 153 mg/dL (ref 0–200)
HDL: 54.8 mg/dL (ref >39.00)
LDL Cholesterol: 79 mg/dL (ref 0–99)
NonHDL: 98.11
Total CHOL/HDL Ratio: 3
Triglycerides: 95 mg/dL (ref 0.0–149.0)
VLDL: 19 mg/dL (ref 0.0–40.0)

## 2024-07-05 LAB — BASIC METABOLIC PANEL WITH GFR
BUN: 16 mg/dL (ref 6–23)
CO2: 31 meq/L (ref 19–32)
Calcium: 10.2 mg/dL (ref 8.4–10.5)
Chloride: 104 meq/L (ref 96–112)
Creatinine, Ser: 0.81 mg/dL (ref 0.40–1.20)
GFR: 69.52 mL/min (ref >60.00)
Glucose, Bld: 85 mg/dL (ref 70–99)
Potassium: 3.7 meq/L (ref 3.5–5.1)
Sodium: 140 meq/L (ref 135–145)

## 2024-07-05 LAB — HEPATIC FUNCTION PANEL
ALT: 18 U/L (ref 0–35)
AST: 19 U/L (ref 0–37)
Albumin: 4 g/dL (ref 3.5–5.2)
Alkaline Phosphatase: 74 U/L (ref 39–117)
Bilirubin, Direct: 0.1 mg/dL (ref 0.0–0.3)
Total Bilirubin: 0.6 mg/dL (ref 0.2–1.2)
Total Protein: 6.4 g/dL (ref 6.0–8.3)

## 2024-07-06 ENCOUNTER — Encounter: Payer: Self-pay | Admitting: Internal Medicine

## 2024-07-06 ENCOUNTER — Ambulatory Visit: Payer: Self-pay | Admitting: Internal Medicine

## 2024-07-06 DIAGNOSIS — N200 Calculus of kidney: Secondary | ICD-10-CM | POA: Insufficient documentation

## 2024-07-07 ENCOUNTER — Ambulatory Visit: Admitting: Internal Medicine

## 2024-07-13 ENCOUNTER — Ambulatory Visit: Admitting: Internal Medicine

## 2024-07-13 VITALS — BP 116/70 | HR 74 | Resp 16 | Ht 60.0 in | Wt 125.0 lb

## 2024-07-13 DIAGNOSIS — F439 Reaction to severe stress, unspecified: Secondary | ICD-10-CM

## 2024-07-13 DIAGNOSIS — N200 Calculus of kidney: Secondary | ICD-10-CM

## 2024-07-13 DIAGNOSIS — Z23 Encounter for immunization: Secondary | ICD-10-CM | POA: Diagnosis not present

## 2024-07-13 DIAGNOSIS — C50412 Malignant neoplasm of upper-outer quadrant of left female breast: Secondary | ICD-10-CM

## 2024-07-13 DIAGNOSIS — Z170 Estrogen receptor positive status [ER+]: Secondary | ICD-10-CM

## 2024-07-13 DIAGNOSIS — E7800 Pure hypercholesterolemia, unspecified: Secondary | ICD-10-CM | POA: Diagnosis not present

## 2024-07-13 DIAGNOSIS — I10 Essential (primary) hypertension: Secondary | ICD-10-CM

## 2024-07-13 DIAGNOSIS — D219 Benign neoplasm of connective and other soft tissue, unspecified: Secondary | ICD-10-CM

## 2024-07-13 DIAGNOSIS — M810 Age-related osteoporosis without current pathological fracture: Secondary | ICD-10-CM

## 2024-07-13 DIAGNOSIS — W540XXA Bitten by dog, initial encounter: Secondary | ICD-10-CM

## 2024-07-13 MED ORDER — SULFAMETHOXAZOLE-TRIMETHOPRIM 800-160 MG PO TABS: 1.0000 | ORAL_TABLET | Freq: Two times a day (BID) | ORAL | 0 refills | Status: DC

## 2024-07-13 NOTE — Progress Notes (Signed)
 Subjective:    Patient ID: Jamie Elliott, female    DOB: Aug 11, 1946, 78 y.o.   MRN: [id number]  Patient here for  Chief Complaint  Patient presents with   Medical Management of Chronic Issues    HPI Here for a scheduled follow up - follow up regarding hypercholesterolemia and hypertension. Recently diagnosed with breast cancer. Is s/p left breast partial mastectomy 04/22/23, with f/u surgery 05/20/23. XRT. Had f/u [facility] - 10/15/23 - recommended f/u and diagnostic mammogram in 05/2024. Saw Dr Rennie 06/13/24  - continues on anastrozole .  had intolerance to fosamax . Saw endocrinology 02/14/24 - recommended reclast. (Reclast infusion 03/28/24). Has been going to PT for cervicalgia and left arm pain. Saw urology 06/2023  - Not visualized on recent RUS, so we can discontinue screening - per urology. Did recommend f/u in one year. Had f/u 07/05/24 - recommended to continue d-mannose. F/u KUB in one year - possible 7.81mm stone - right kidney. Reported recently visiting a friend. Was playing with their dog and throwing a toy. Reached down to get the toy and the dog bit her right index finger - trying to bite the toy. Accidentally caught her finger. Dog up to date with shots. Area cleaned. Able to move finger without pain.    Past Medical History:  Diagnosis Date   Allergy 1970's.   Blood transfusion without reported diagnosis 1983   Cancer (HCC) 02-2023   Cataract 2023   Environmental allergies    Hypercholesterolemia    Hypertension    Migraines    Past Surgical History:  Procedure Laterality Date   ABDOMINAL HYSTERECTOMY  12/21/1998   APPENDECTOMY  12/21/1998   BREAST SURGERY  05/2023   at [facility] lumpectomy   CATARACT EXTRACTION W/PHACO Left 04/23/2021   Procedure: CATARACT EXTRACTION PHACO AND INTRAOCULAR LENS PLACEMENT (IOC) LEFT panoptix toric 10.41 01:22.8 12.6%;  Surgeon: Mittie Gaskin, MD;  Location: [facility];  Service: Ophthalmology;  Laterality: Left;   CATARACT  EXTRACTION W/PHACO Right 05/07/2021   Procedure: CATARACT EXTRACTION PHACO AND INTRAOCULAR LENS PLACEMENT (IOC) RIGHT  panoptix 12.85 01:33.8 13.7%;  Surgeon: Mittie Gaskin, MD;  Location: [facility];  Service: Ophthalmology;  Laterality: Right;   NOSE SURGERY  12/21/1965   SEPTOPLASTY     SHOULDER ARTHROSCOPY WITH OPEN ROTATOR CUFF REPAIR Right 12/05/2015   Procedure: SHOULDER ARTHROSCOPY WITH MINI OPEN ROTATOR CUFF REPAIR. DISTAL CLAVICLE EXCISION;  Surgeon: Franky Cranker, MD;  Location: [facility];  Service: Orthopedics;  Laterality: Right;   Family History  Problem Relation Age of Onset   Heart disease Mother    Hyperlipidemia Mother    Hypertension Mother    Lung cancer Father        dx 65s   Hyperlipidemia Father    Hypertension Father    Prostate cancer Father        dx 51s   Cancer Father    Breast cancer Maternal Aunt        dx 60s-70s   Breast cancer Maternal Grandmother        dx 90s   Bladder Cancer Neg Hx    Kidney cancer Neg Hx    Social History   Socioeconomic History   Marital status: Married    Spouse name: Not on file   Number of children: 2   Years of education: Not on file   Highest education level: Bachelor's degree (e.g., BA, AB, BS)  Occupational History   Not on file  Tobacco Use   Smoking  status: Never   Smokeless tobacco: Never  Vaping Use   Vaping status: Never Used  Substance and Sexual Activity   Alcohol use: No    Alcohol/week: 0.0 standard drinks of alcohol   Drug use: No   Sexual activity: Not Currently  Other Topics Concern   Not on file  Social History Narrative   married   Social Drivers of Corporate investment banker Strain: Low Risk  (07/12/2024)   Overall Financial Resource Strain (CARDIA)    Difficulty of Paying Living Expenses: Not hard at all  Food Insecurity: No Food Insecurity (07/12/2024)   Hunger Vital Sign    Worried About Running Out of Food in the Last Year: Never true    Ran Out of Food in the  Last Year: Never true  Transportation Needs: No Transportation Needs (07/12/2024)   PRAPARE - Administrator, Civil Service (Medical): No    Lack of Transportation (Non-Medical): No  Physical Activity: Insufficiently Active (07/12/2024)   Exercise Vital Sign    Days of Exercise per Week: 3 days    Minutes of Exercise per Session: 40 min  Stress: No Stress Concern Present (07/12/2024)   [employer] of Occupational Health - Occupational Stress Questionnaire    Feeling of Stress: Not at all  Social Connections: Socially Integrated (07/12/2024)   Social Connection and Isolation Panel    Frequency of Communication with Friends and Family: More than three times a week    Frequency of Social Gatherings with Friends and Family: Twice a week    Attends Religious Services: More than 4 times per year    Active Member of [employer] or Organizations: Yes    Attends Engineer, structural: More than 4 times per year    Marital Status: Married     Review of Systems  Constitutional:  Negative for appetite change, fever and unexpected weight change.  HENT:  Negative for congestion and sinus pressure.   Respiratory:  Negative for cough, chest tightness and shortness of breath.   Cardiovascular:  Negative for chest pain, palpitations and leg swelling.  Gastrointestinal:  Negative for abdominal pain, diarrhea, nausea and vomiting.  Genitourinary:  Negative for difficulty urinating and dysuria.  Musculoskeletal:  Negative for joint swelling and myalgias.  Skin:  Negative for color change and rash.  Neurological:  Negative for dizziness and headaches.  Psychiatric/Behavioral:  Negative for agitation and dysphoric mood.        Objective:     BP 116/70   Pulse 74   Resp 16   Ht 5' (1.524 m)   Wt 125 lb (56.7 kg)   LMP 11/28/1998   SpO2 98%   BMI 24.41 kg/m  Wt Readings from Last 3 Encounters:  07/13/24 125 lb (56.7 kg)  07/05/24 125 lb (56.7 kg)  06/13/24 125 lb 14.4 oz  (57.1 kg)    Physical Exam Vitals reviewed.  Constitutional:      General: She is not in acute distress.    Appearance: Normal appearance.  HENT:     Head: Normocephalic and atraumatic.     Right Ear: External ear normal.     Left Ear: External ear normal.     Mouth/Throat:     Pharynx: No oropharyngeal exudate or posterior oropharyngeal erythema.  Eyes:     General: No scleral icterus.       Right eye: No discharge.        Left eye: No discharge.     Conjunctiva/sclera:  Conjunctivae normal.  Neck:     Thyroid : No thyromegaly.  Cardiovascular:     Rate and Rhythm: Normal rate and regular rhythm.  Pulmonary:     Effort: No respiratory distress.     Breath sounds: Normal breath sounds. No wheezing.  Abdominal:     General: Bowel sounds are normal.     Palpations: Abdomen is soft.     Tenderness: There is no abdominal tenderness.  Musculoskeletal:        General: No swelling or tenderness.     Cervical back: Neck supple. No tenderness.     Comments: No pain - flexion of finger.   Lymphadenopathy:     Cervical: No cervical adenopathy.  Skin:    Findings: No erythema or rash.     Comments: Small puncture wound - right index finger. No increased erythema. Minimal bruising.   Neurological:     Mental Status: She is alert.  Psychiatric:        Mood and Affect: Mood normal.        Behavior: Behavior normal.         Outpatient Encounter Medications as of 07/13/2024  Medication Sig   sulfamethoxazole -trimethoprim  (BACTRIM  DS) 800-160 MG tablet Take 1 tablet by mouth 2 (two) times daily.   acetaminophen  (TYLENOL ) 500 MG tablet Take 500 mg by mouth every 6 (six) hours as needed.   amLODipine  (NORVASC ) 5 MG tablet Take 1 tablet (5 mg total) by mouth daily.   anastrozole  (ARIMIDEX ) 1 MG tablet Take 1 tablet (1 mg total) by mouth daily.   Ascorbic Acid (VITAMIN C) 1000 MG tablet Take 1,000 mg by mouth daily.   atorvastatin  (LIPITOR) 40 MG tablet Take 1 tablet (40 mg total) by  mouth daily.   cetirizine  (ZYRTEC ) 10 MG tablet Take 1 tablet (10 mg total) by mouth daily.   Cholecalciferol (VITAMIN D3) 25 MCG (1000 UT) CAPS Take 1 capsule by mouth daily.   CRANBERRY EXTRACT PO Take 1 capsule by mouth 2 (two) times daily.   D-Mannose 500 MG CAPS Take 500 mg by mouth in the morning and at bedtime.   hydrochlorothiazide  (HYDRODIURIL ) 12.5 MG tablet Take 1 tablet (12.5 mg total) by mouth daily.   ibuprofen  (ADVIL ) 600 MG tablet Take 1 tablet (600 mg total) by mouth 3 (three) times daily. (Patient taking differently: Take 600 mg by mouth 3 (three) times daily as needed.)   losartan  (COZAAR ) 100 MG tablet Take 1 tablet (100 mg total) by mouth daily.   polyethylene glycol (MIRALAX / GLYCOLAX) 17 g packet Take 17 g by mouth every other day.   Probiotic Product (ALIGN PO) Take by mouth as needed.   triamcinolone  (NASACORT ) 55 MCG/ACT nasal inhaler Place 2 sprays into the nose as needed.   No facility-administered encounter medications on file as of 07/13/2024.     Lab Results  Component Value Date   WBC 5.9 11/08/2023   HGB 12.8 11/08/2023   HCT 38.4 11/08/2023   PLT 226.0 11/08/2023   GLUCOSE 85 07/05/2024   CHOL 153 07/05/2024   TRIG 95.0 07/05/2024   HDL 54.80 07/05/2024   LDLDIRECT 103.0 05/21/2015   LDLCALC 79 07/05/2024   ALT 18 07/05/2024   AST 19 07/05/2024   NA 140 07/05/2024   K 3.7 07/05/2024   CL 104 07/05/2024   CREATININE 0.81 07/05/2024   BUN 16 07/05/2024   CO2 31 07/05/2024   TSH 1.50 03/24/2024   INR 1.0 03/17/2022    DG Bone Density  Result Date: 08/09/2023 EXAM: DUAL X-RAY ABSORPTIOMETRY (DXA) FOR BONE MINERAL DENSITY IMPRESSION: Your patient Darrell Leonhardt completed a BMD test on 08/09/2023 using the [employer] DXA System (software version: 14.10) manufactured by [employer]. The following summarizes the results of our evaluation. Technologist:VLM PATIENT BIOGRAPHICAL: Name: Pennie, Vanblarcom Patient ID: [id number] Birth Date:  [birth year] Height: 62.0 in. Gender: Female Exam Date: 08/09/2023 Weight: 125.6 lbs. Indications: Caucasian, History of Breast Cancer, Hysterectomy, Postmenopausal Fractures: Treatments: calcium  w/ vit D DENSITOMETRY RESULTS: Site          Region     Measured Date Measured Age WHO Classification Young Adult T-score BMD         %Change vs. Previous Significant Change (*) Right Forearm Radius 33% 08/09/2023 77.5 Oste[age] -1.6 0.737 g/cm2 - - DualFemur Total Left 8/09/2023 77.5 Osteoporosis -2.6 0.686 g/cm2 - - ASSESSMENT: The BMD measured at Femur Total Left is 0.686 g/cm2 with a T-score of -2.6. This patient is considered osteoporotic according to World Health Organization [facility] criteria. Lumbar spine was not utilized due to advanced degenerative changes and scoliosis. The scan quality is good. World Science writer [facility]) criteria for post-menopausal, Caucasian Women: Normal:                   T-score at or above -1 SD Osteopenia/low bone mass: T-score between -1 and -2.5 SD Osteoporosis:             T-score at or below -2.5 SD RECOMMENDATIONS: 1. All patients should optimize calcium  and vitamin D  intake. 2. Consider FDA-approved medical therapies in postmenopausal women and men aged 54 years and older, based on the following: a. A hip or vertebral(clinical or morphometric) fracture b. T-score < -2.5 at the femoral neck or spine after appropriate evaluation to exclude secondary causes c. Low bone mass (T-score between -1.0 and -2.5 at the femoral neck or spine) and a 10-year probability of a hip fracture > 3% or a 10-year probability of a major osteoporosis-related fracture > 20% based on the [place] -adapted WHO algorithm 3. Clinician judgment and/or patient preferences may indicate treatment for people with 10-year fracture probabilities above or below these levels FOLLOW-UP: People with diagnosed cases of osteoporosis or at high risk for fracture should have regular bone mineral density tests. For patients  eligible for Medicare, routine testing is allowed once every 2 years. The testing frequency can be increased to one year for patients who have rapidly progressing disease, those who are receiving or discontinuing medical therapy to restore bone mass, or have additional risk factors. I have reviewed this report, and agree with the above findings. [facility] Electronically Signed   By: Rosaline Collet M.D.   On: 08/09/2023 14:57       Assessment & Plan:  Need for tetanus booster -     Td vaccine greater than or equal to 7yo preservative free IM  Hypercholesterolemia Assessment & Plan: Continue lipitor.  Low cholesterol diet and exercise.  Follow lipid panel and liver function tests. No change in medication today.   Orders: -     Basic metabolic panel with GFR; Future -     Lipid panel; Future -     Hepatic function panel; Future -     CBC with Differential/Platelet; Future  Angioleiomyoma Assessment & Plan: Saw urology 06/2023  - Not visualized on recent RUS, so we can discontinue screening - per urology. Did recommend f/u in one year. Had f/u 07/05/24 - recommended to continue  d-mannose. F/u KUB in one year - possible 7.67mm stone - right kidney.    Carcinoma of upper-outer quadrant of left breast in female, estrogen receptor positive (HCC) Assessment & Plan: Left breast stage I breast cancer ER/PR positive HER2 negative [s/p lumpectomy [facility]; Dr.Hwang] ; pT1b pSnLNx-M0- G-2. S/p left breast partial mastectomy 04/22/23. Had to go back for f/u surgery 05/20/23.  XRT.  Incision - closed.  Started anastrozole . Previously breast lesion - resolved.  Continue f/u with oncology. Mammogram 06/07/24 - birads II. Continue t/u with oncology.    Primary hypertension Assessment & Plan: Continue amlodipine  and hctz. Follow pressures. Follow metabolic panel. No changes in medication today    Nephrolithiasis Assessment & Plan: Follow up urology 06/2024 -  Nephrolithiasis - RUS (04/2023)  demonstrated a possible 7.6 mm stone in the right kidney - Will follow-up with KUB next year (06/2025)   Osteoporosis without current pathological fracture, unspecified osteoporosis type Assessment & Plan: Reclast infusion 03/28/24.    Stress Assessment & Plan: Overall appears to be handling things relatively well. Follow.    Dog bite, initial encounter Assessment & Plan: Accidental contact. Was biting for the toy. Tiny puncture. No increased erythema. Dog up to date with shots. Discussed further treatment. Tetanus booster given. Cover with bactrim . Follow.  Call with update.    Other orders -     Sulfamethoxazole -Trimethoprim ; Take 1 tablet by mouth 2 (two) times daily.  Dispense: 14 tablet; Refill: 0     Allena Hamilton, MD

## 2024-07-16 ENCOUNTER — Encounter: Payer: Self-pay | Admitting: Internal Medicine

## 2024-07-16 DIAGNOSIS — W540XXA Bitten by dog, initial encounter: Secondary | ICD-10-CM | POA: Insufficient documentation

## 2024-07-16 NOTE — Assessment & Plan Note (Addendum)
Overall appears to be handling things relatively well.  Follow.   

## 2024-07-16 NOTE — Assessment & Plan Note (Addendum)
 Left breast stage I breast cancer ER/PR positive HER2 negative [s/p lumpectomy [facility]; Dr.Hwang] ; pT1b pSnLNx-M0- G-2. S/p left breast partial mastectomy 04/22/23. Had to go back for f/u surgery 05/20/23.  XRT.  Incision - closed.  Started anastrozole . Previously breast lesion - resolved.  Continue f/u with oncology. Mammogram 06/07/24 - birads II. Continue t/u with oncology.

## 2024-07-16 NOTE — Assessment & Plan Note (Signed)
 Continue amlodipine  and hctz. Follow pressures. Follow metabolic panel. No changes in medication today

## 2024-07-16 NOTE — Assessment & Plan Note (Signed)
 Continue lipitor.  Low cholesterol diet and exercise.  Follow lipid panel and liver function tests. No change in medication today.

## 2024-07-16 NOTE — Assessment & Plan Note (Signed)
 Saw urology 06/2023  - Not visualized on recent RUS, so we can discontinue screening - per urology. Did recommend f/u in one year. Had f/u 07/05/24 - recommended to continue d-mannose. F/u KUB in one year - possible 7.75mm stone - right kidney.

## 2024-07-16 NOTE — Assessment & Plan Note (Signed)
 Reclast infusion 03/28/24.

## 2024-07-16 NOTE — Assessment & Plan Note (Signed)
 Follow up urology 06/2024 -  Nephrolithiasis - RUS (04/2023) demonstrated a possible 7.6 mm stone in the right kidney - Will follow-up with KUB next year (06/2025)

## 2024-07-16 NOTE — Assessment & Plan Note (Signed)
 Accidental contact. Was biting for the toy. Tiny puncture. No increased erythema. Dog up to date with shots. Discussed further treatment. Tetanus booster given. Cover with bactrim . Follow.  Call with update.

## 2024-08-30 DIAGNOSIS — H43813 Vitreous degeneration, bilateral: Secondary | ICD-10-CM | POA: Diagnosis not present

## 2024-08-30 DIAGNOSIS — Z961 Presence of intraocular lens: Secondary | ICD-10-CM | POA: Diagnosis not present

## 2024-08-30 DIAGNOSIS — H26491 Other secondary cataract, right eye: Secondary | ICD-10-CM | POA: Diagnosis not present

## 2024-08-30 DIAGNOSIS — H40003 Preglaucoma, unspecified, bilateral: Secondary | ICD-10-CM | POA: Diagnosis not present

## 2024-09-06 DIAGNOSIS — H26491 Other secondary cataract, right eye: Secondary | ICD-10-CM | POA: Diagnosis not present

## 2024-09-15 ENCOUNTER — Other Ambulatory Visit: Payer: Self-pay | Admitting: Internal Medicine

## 2024-10-05 ENCOUNTER — Ambulatory Visit: Payer: Self-pay

## 2024-10-05 ENCOUNTER — Encounter: Payer: Self-pay | Admitting: Nurse Practitioner

## 2024-10-05 ENCOUNTER — Ambulatory Visit: Admitting: Nurse Practitioner

## 2024-10-05 VITALS — BP 106/62 | HR 74 | Temp 98.0°F | Ht 60.0 in | Wt 123.2 lb

## 2024-10-05 DIAGNOSIS — R6883 Chills (without fever): Secondary | ICD-10-CM | POA: Diagnosis not present

## 2024-10-05 DIAGNOSIS — R52 Pain, unspecified: Secondary | ICD-10-CM

## 2024-10-05 DIAGNOSIS — T881XXA Other complications following immunization, not elsewhere classified, initial encounter: Secondary | ICD-10-CM

## 2024-10-05 LAB — POC COVID19 BINAXNOW: SARS Coronavirus 2 Ag: NEGATIVE

## 2024-10-05 LAB — POCT INFLUENZA A/B
Influenza A, POC: NEGATIVE
Influenza B, POC: NEGATIVE

## 2024-10-05 NOTE — Patient Instructions (Signed)
-  Apply cold packs to the injection site to reduce soreness. -Use hydrocortisone cream on the rash to alleviate itching and redness. -Avoid taking Benadryl during the day to prevent dizziness. -Monitor your symptoms and report if they do not improve by Monday.

## 2024-10-05 NOTE — Progress Notes (Signed)
 Established Patient Office Visit  Subjective:  Patient ID: Jamie Elliott, female    DOB: [birth year]  Age: 78 y.o. MRN: [id number]  CC:  Chief Complaint  Patient presents with   Acute Visit    Received Covid vaccine in Right deltoid  on 10/02/24 3 hours later had body aches, headache, fever, chills, right arm redness and itchy   Discussed the use of a AI scribe software for clinical note transcription with the patient, who gave verbal consent to proceed.  HPI  Jamie Elliott is a 78 year old female who presents with symptoms following a COVID-19 vaccine.  She experiences fever, chills, and body aches, particularly in her lower back, legs, and arms, starting a few hours after receiving the COVID-19 vaccine. She takes extra strength Tylenol  every six hours for these symptoms. By Wednesday, she develops itching and a rash on her arms, applying cortisone cream and taking Benadryl, which she believes causes dizziness. The rash remains red and sore.  She feels sinus pressure around her temples. She takes Nasacort  and cetirizine  daily for allergies. She denies cough, congestion, or significant nasal discharge. She also feels tired and has soreness in her arms, particularly at the injection site.   HPI   Past Medical History:  Diagnosis Date   Allergy 1970's.   Blood transfusion without reported diagnosis 1983   Cancer (HCC) 02-2023   Cataract 2023   Environmental allergies    Hypercholesterolemia    Hypertension    Migraines     Past Surgical History:  Procedure Laterality Date   ABDOMINAL HYSTERECTOMY  12/21/1998   APPENDECTOMY  12/21/1998   BREAST SURGERY  05/2023   at [facility] lumpectomy   CATARACT EXTRACTION W/PHACO Left 04/23/2021   Procedure: CATARACT EXTRACTION PHACO AND INTRAOCULAR LENS PLACEMENT (IOC) LEFT panoptix toric 10.41 01:22.8 12.6%;  Surgeon: Mittie Gaskin, MD;  Location: [facility];  Service: Ophthalmology;  Laterality: Left;   CATARACT EXTRACTION  W/PHACO Right 05/07/2021   Procedure: CATARACT EXTRACTION PHACO AND INTRAOCULAR LENS PLACEMENT (IOC) RIGHT  panoptix 12.85 01:33.8 13.7%;  Surgeon: Mittie Gaskin, MD;  Location: [facility];  Service: Ophthalmology;  Laterality: Right;   NOSE SURGERY  12/21/1965   SEPTOPLASTY     SHOULDER ARTHROSCOPY WITH OPEN ROTATOR CUFF REPAIR Right 12/05/2015   Procedure: SHOULDER ARTHROSCOPY WITH MINI OPEN ROTATOR CUFF REPAIR. DISTAL CLAVICLE EXCISION;  Surgeon: Franky Cranker, MD;  Location: [facility];  Service: Orthopedics;  Laterality: Right;    Family History  Problem Relation Age of Onset   Heart disease Mother    Hyperlipidemia Mother    Hypertension Mother    Lung cancer Father        dx 66s   Hyperlipidemia Father    Hypertension Father    Prostate cancer Father        dx 52s   Cancer Father    Breast cancer Maternal Aunt        dx 60s-70s   Breast cancer Maternal Grandmother        dx 90s   Bladder Cancer Neg Hx    Kidney cancer Neg Hx     Social History   Socioeconomic History   Marital status: Married    Spouse name: Not on file   Number of children: 2   Years of education: Not on file   Highest education level: Bachelor's degree (e.g., BA, AB, BS)  Occupational History   Not on file  Tobacco Use   Smoking status: Never  Smokeless tobacco: Never  Vaping Use   Vaping status: Never Used  Substance and Sexual Activity   Alcohol use: No    Alcohol/week: 0.0 standard drinks of alcohol   Drug use: No   Sexual activity: Not Currently  Other Topics Concern   Not on file  Social History Narrative   married   Social Drivers of Corporate Investment Banker Strain: Low Risk  (07/12/2024)   Overall Financial Resource Strain (CARDIA)    Difficulty of Paying Living Expenses: Not hard at all  Food Insecurity: No Food Insecurity (07/12/2024)   Hunger Vital Sign    Worried About Running Out of Food in the Last Year: Never true    Ran Out of Food in the Last  Year: Never true  Transportation Needs: No Transportation Needs (07/12/2024)   PRAPARE - Administrator, Civil Service (Medical): No    Lack of Transportation (Non-Medical): No  Physical Activity: Insufficiently Active (07/12/2024)   Exercise Vital Sign    Days of Exercise per Week: 3 days    Minutes of Exercise per Session: 40 min  Stress: No Stress Concern Present (07/12/2024)   [employer] of Occupational Health - Occupational Stress Questionnaire    Feeling of Stress: Not at all  Social Connections: Socially Integrated (07/12/2024)   Social Connection and Isolation Panel    Frequency of Communication with Friends and Family: More than three times a week    Frequency of Social Gatherings with Friends and Family: Twice a week    Attends Religious Services: More than 4 times per year    Active Member of [employer] or Organizations: Yes    Attends Engineer, Structural: More than 4 times per year    Marital Status: Married  Catering Manager Violence: Not At Risk (01/10/2024)   Humiliation, Afraid, Rape, and Kick questionnaire    Fear of Current or Ex-Partner: No    Emotionally Abused: No    Physically Abused: No    Sexually Abused: No     Outpatient Medications Prior to Visit  Medication Sig Dispense Refill   acetaminophen  (TYLENOL ) 500 MG tablet Take 500 mg by mouth every 6 (six) hours as needed.     amLODipine  (NORVASC ) 5 MG tablet Take 1 tablet (5 mg total) by mouth daily. 90 tablet 3   anastrozole  (ARIMIDEX ) 1 MG tablet Take 1 tablet (1 mg total) by mouth daily. 90 tablet 1   Ascorbic Acid (VITAMIN C) 1000 MG tablet Take 1,000 mg by mouth daily.     atorvastatin  (LIPITOR) 40 MG tablet Take 1 tablet (40 mg total) by mouth daily. 90 tablet 3   cetirizine  (ZYRTEC ) 10 MG tablet Take 1 tablet (10 mg total) by mouth daily. 90 tablet 0   Cholecalciferol (VITAMIN D3) 25 MCG (1000 UT) CAPS Take 1 capsule by mouth daily.     CRANBERRY EXTRACT PO Take 1 capsule by mouth  2 (two) times daily.     D-Mannose 500 MG CAPS Take 500 mg by mouth in the morning and at bedtime.     hydrochlorothiazide  (HYDRODIURIL ) 12.5 MG tablet Take 1 tablet (12.5 mg total) by mouth daily. 90 tablet 3   ibuprofen  (ADVIL ) 600 MG tablet Take 1 tablet (600 mg total) by mouth 3 (three) times daily. (Patient taking differently: Take 600 mg by mouth 3 (three) times daily as needed.) 15 tablet 0   losartan  (COZAAR ) 100 MG tablet Take 1 tablet (100 mg total) by mouth daily. 90  tablet 3   polyethylene glycol (MIRALAX / GLYCOLAX) 17 g packet Take 17 g by mouth every other day.     Probiotic Product (ALIGN PO) Take by mouth as needed.     sulfamethoxazole -trimethoprim  (BACTRIM  DS) 800-160 MG tablet Take 1 tablet by mouth 2 (two) times daily. 14 tablet 0   triamcinolone  (NASACORT ) 55 MCG/ACT nasal inhaler Place 2 sprays into the nose as needed. 1 Inhaler 5   No facility-administered medications prior to visit.    Allergies  Allergen Reactions   Ciprofloxacin  Other (See Comments)    Patient reports it doesn't work for me. Last culture was sensitive, however.    Zithromax [Azithromycin]    Augmentin [Amoxicillin-Pot Clavulanate] Rash   Doxycycline  Nausea And Vomiting    Sensitive on her stomach    ROS Review of Systems Negative unless indicated in HPI.    Objective:    Physical Exam Constitutional:      Appearance: Normal appearance.  HENT:     Mouth/Throat:     Mouth: Mucous membranes are moist.  Eyes:     Conjunctiva/sclera: Conjunctivae normal.  Cardiovascular:     Rate and Rhythm: Normal rate and regular rhythm.     Pulses: Normal pulses.     Heart sounds: Normal heart sounds.  Pulmonary:     Effort: Pulmonary effort is normal.     Breath sounds: Normal breath sounds.  Musculoskeletal:     Cervical back: Normal range of motion. No tenderness.  Skin:    General: Skin is warm.     Findings: Erythema (to injection site) present. No bruising.  Neurological:      General: No focal deficit present.     Mental Status: She is alert and oriented to person, place, and time. Mental status is at baseline.  Psychiatric:        Mood and Affect: Mood normal.        Behavior: Behavior normal.        Thought Content: Thought content normal.        Judgment: Judgment normal.     BP 106/62   Pulse 74   Temp 98 F (36.7 C)   Ht 5' (1.524 m)   Wt 123 lb 3.2 oz (55.9 kg)   LMP 11/28/1998   SpO2 98%   BMI 24.06 kg/m  Wt Readings from Last 3 Encounters:  10/05/24 123 lb 3.2 oz (55.9 kg)  07/13/24 125 lb (56.7 kg)  07/05/24 125 lb (56.7 kg)     Health Maintenance  Topic Date Due   Zoster Vaccines- Shingrix (1 of 2) 01/27/1965   Influenza Vaccine  03/20/2025 (Originally 07/21/2024)   COVID-19 Vaccine (8 - 2025-26 season) 11/27/2024   Medicare Annual Wellness (AWV)  01/09/2025   Mammogram  06/07/2025   DTaP/Tdap/Td (3 - Td or Tdap) 07/13/2034   Pneumococcal Vaccine: 50+ Years  Completed   DEXA SCAN  Completed   Hepatitis C Screening  Completed   Meningococcal B Vaccine  Aged Out   Colonoscopy  Discontinued    There are no preventive care reminders to display for this patient.  Lab Results  Component Value Date   TSH 1.50 03/24/2024   Lab Results  Component Value Date   WBC 5.9 11/08/2023   HGB 12.8 11/08/2023   HCT 38.4 11/08/2023   MCV 94.5 11/08/2023   PLT 226.0 11/08/2023   Lab Results  Component Value Date   NA 140 07/05/2024   K 3.7 07/05/2024   CO2 31 07/05/2024  GLUCOSE 85 07/05/2024   BUN 16 07/05/2024   CREATININE 0.81 07/05/2024   BILITOT 0.6 07/05/2024   ALKPHOS 74 07/05/2024   AST 19 07/05/2024   ALT 18 07/05/2024   PROT 6.4 07/05/2024   ALBUMIN 4.0 07/05/2024   CALCIUM  10.2 07/05/2024   ANIONGAP 12 03/17/2022   GFR 69.52 07/05/2024   Lab Results  Component Value Date   CHOL 153 07/05/2024   Lab Results  Component Value Date   HDL 54.80 07/05/2024   Lab Results  Component Value Date   LDLCALC 79  07/05/2024   Lab Results  Component Value Date   TRIG 95.0 07/05/2024   Lab Results  Component Value Date   CHOLHDL 3 07/05/2024   No results found for: HGBA1C    Assessment & Plan:  Local reaction to COVID-19 vaccine Assessment & Plan: Pruritus and slight erythema to the injection site. - Apply cold packs to injection site. - Use hydrocortisone cream on rash. - Avoid daytime Benadryl. - Monitor symptoms, report if not improved by Monday.   Body aches Assessment & Plan: POCT COVID and flu negative. - Advised symptomatic care.  Orders: -     POC COVID-19 BinaxNow -     POCT Influenza A/B  Chills -     POC COVID-19 BinaxNow -     POCT Influenza A/B    Follow-up: Return if symptoms worsen or fail to improve.   Jayah Balthazar, NP

## 2024-10-05 NOTE — Telephone Encounter (Signed)
 FYI Only or Action Required?: FYI only for provider.  Patient was last seen in primary care on 07/13/2024 by Jamie Shad, MD.  Called Nurse Triage reporting No chief complaint on file.  Symptoms began several days ago.  Interventions attempted: OTC medications: Tylenol , hydrocortisone cream .  Symptoms are: gradually improving.  Triage Disposition: Home Care  Patient/caregiver understands and will follow disposition?: Yes - Pt wants pcp to know about reaction.                       Copied from CRM [id number]. Topic: Clinical - Red Word Triage >> Oct 05, 2024  8:10 AM Jamie Elliott wrote: Red Word that prompted transfer to Nurse Triage:  She is having a reaction to the COVID vaccine. She got the vaccine on Monday. Rash on right arm; achy all over; joint pain,she had a high fever and now it a low fever, headache. Reason for Disposition  COVID-19 vaccine, injection site reaction (e.g., pain, redness, swelling), questions about  Answer Assessment - Initial Assessment Questions 1. MAIN CONCERN OR SYMPTOM: What is your main concern? What is the main symptom? (e.g., fever, pain, redness, swelling)     Rash at site of vaccine, BA, and fever 2. ONSET: When was the vaccine (shot) given? How much later did the reaction begin? (e.g., hours, days ago)      Monday night 3. SEVERITY: How bad is it?      moderate 4. VACCINE: What vaccination did you receive? (e.g., none; Moderna, Pfizer)       New COVID shot 5. FEVER: Do you have a fever? If Yes, ask: What is your temperature, how was it measured, and when did it start?      Yes - pt states it was high - did not measure 6. PAST REACTIONS: Have you reacted to immunizations before? If Yes, ask: What happened?     yes 7. OTHER SYMPTOMS: Do you have any other symptoms? (e.g., fatigue, headache, joint or muscle pain)     Joint pain, HA and fever, rash and itchy  Protocols used: COVID-19 - Vaccine Questions and  Reactions-A-AH

## 2024-10-12 DIAGNOSIS — H43813 Vitreous degeneration, bilateral: Secondary | ICD-10-CM | POA: Diagnosis not present

## 2024-10-12 DIAGNOSIS — H40003 Preglaucoma, unspecified, bilateral: Secondary | ICD-10-CM | POA: Diagnosis not present

## 2024-10-12 DIAGNOSIS — H16229 Keratoconjunctivitis sicca, not specified as Sjogren's, unspecified eye: Secondary | ICD-10-CM | POA: Diagnosis not present

## 2024-10-12 DIAGNOSIS — Z961 Presence of intraocular lens: Secondary | ICD-10-CM | POA: Diagnosis not present

## 2024-10-16 DIAGNOSIS — R52 Pain, unspecified: Secondary | ICD-10-CM | POA: Insufficient documentation

## 2024-10-16 DIAGNOSIS — T881XXA Other complications following immunization, not elsewhere classified, initial encounter: Secondary | ICD-10-CM | POA: Insufficient documentation

## 2024-10-16 NOTE — Assessment & Plan Note (Signed)
 POCT COVID and flu negative. - Advised symptomatic care.

## 2024-10-16 NOTE — Assessment & Plan Note (Signed)
 Pruritus and slight erythema to the injection site. - Apply cold packs to injection site. - Use hydrocortisone cream on rash. - Avoid daytime Benadryl. - Monitor symptoms, report if not improved by Monday.

## 2024-11-13 ENCOUNTER — Other Ambulatory Visit

## 2024-11-13 DIAGNOSIS — E7800 Pure hypercholesterolemia, unspecified: Secondary | ICD-10-CM | POA: Diagnosis not present

## 2024-11-13 LAB — CBC WITH DIFFERENTIAL/PLATELET
Basophils Absolute: 0 K/uL (ref 0.0–0.1)
Basophils Relative: 0.6 % (ref 0.0–3.0)
Eosinophils Absolute: 0.2 K/uL (ref 0.0–0.7)
Eosinophils Relative: 3.1 % (ref 0.0–5.0)
HCT: 38.9 % (ref 36.0–46.0)
Hemoglobin: 13 g/dL (ref 12.0–15.0)
Lymphocytes Relative: 19.8 % (ref 12.0–46.0)
Lymphs Abs: 1.2 K/uL (ref 0.7–4.0)
MCHC: 33.5 g/dL (ref 30.0–36.0)
MCV: 92.8 fl (ref 78.0–100.0)
Monocytes Absolute: 0.4 K/uL (ref 0.1–1.0)
Monocytes Relative: 6.5 % (ref 3.0–12.0)
Neutro Abs: 4.3 K/uL (ref 1.4–7.7)
Neutrophils Relative %: 70 % (ref 43.0–77.0)
Platelets: 197 K/uL (ref 150.0–400.0)
RBC: 4.19 Mil/uL (ref 3.87–5.11)
RDW: 13.6 % (ref 11.5–15.5)
WBC: 6.2 K/uL (ref 4.0–10.5)

## 2024-11-13 LAB — LIPID PANEL
Cholesterol: 153 mg/dL (ref 0–200)
HDL: 53.1 mg/dL (ref >39.00)
LDL Cholesterol: 71 mg/dL (ref 0–99)
NonHDL: 100.11
Total CHOL/HDL Ratio: 3
Triglycerides: 148 mg/dL (ref 0.0–149.0)
VLDL: 29.6 mg/dL (ref 0.0–40.0)

## 2024-11-13 LAB — BASIC METABOLIC PANEL WITH GFR
BUN: 12 mg/dL (ref 6–23)
CO2: 30 meq/L (ref 19–32)
Calcium: 10 mg/dL (ref 8.4–10.5)
Chloride: 105 meq/L (ref 96–112)
Creatinine, Ser: 0.73 mg/dL (ref 0.40–1.20)
GFR: 78.56 mL/min (ref >60.00)
Glucose, Bld: 83 mg/dL (ref 70–99)
Potassium: 3.8 meq/L (ref 3.5–5.1)
Sodium: 142 meq/L (ref 135–145)

## 2024-11-13 LAB — HEPATIC FUNCTION PANEL
ALT: 16 U/L (ref 0–35)
AST: 17 U/L (ref 0–37)
Albumin: 4.1 g/dL (ref 3.5–5.2)
Alkaline Phosphatase: 77 U/L (ref 39–117)
Bilirubin, Direct: 0.1 mg/dL (ref 0.0–0.3)
Total Bilirubin: 0.6 mg/dL (ref 0.2–1.2)
Total Protein: 5.9 g/dL — ABNORMAL LOW (ref 6.0–8.3)

## 2024-11-14 ENCOUNTER — Ambulatory Visit: Admitting: Internal Medicine

## 2024-11-14 ENCOUNTER — Encounter: Payer: Self-pay | Admitting: Internal Medicine

## 2024-11-14 ENCOUNTER — Ambulatory Visit: Payer: Self-pay | Admitting: Internal Medicine

## 2024-11-14 VITALS — BP 110/60 | HR 66 | Temp 97.5°F | Ht 60.0 in | Wt 124.8 lb

## 2024-11-14 DIAGNOSIS — Z0000 Encounter for general adult medical examination without abnormal findings: Secondary | ICD-10-CM | POA: Diagnosis not present

## 2024-11-14 DIAGNOSIS — E7800 Pure hypercholesterolemia, unspecified: Secondary | ICD-10-CM

## 2024-11-14 DIAGNOSIS — I10 Essential (primary) hypertension: Secondary | ICD-10-CM

## 2024-11-14 DIAGNOSIS — Z170 Estrogen receptor positive status [ER+]: Secondary | ICD-10-CM

## 2024-11-14 DIAGNOSIS — F439 Reaction to severe stress, unspecified: Secondary | ICD-10-CM

## 2024-11-14 DIAGNOSIS — M810 Age-related osteoporosis without current pathological fracture: Secondary | ICD-10-CM

## 2024-11-14 DIAGNOSIS — C50412 Malignant neoplasm of upper-outer quadrant of left female breast: Secondary | ICD-10-CM

## 2024-11-14 NOTE — Progress Notes (Signed)
 Subjective:    Patient ID: Jamie Elliott, female    DOB: 30-Apr-1946, 78 y.o.   MRN: [id number]  Patient here for  Chief Complaint  Patient presents with   Annual Exam    CPE & review labs    HPI Here for a physical exam.  Recently diagnosed with breast cancer. Is s/p left breast partial mastectomy 04/22/23, with f/u surgery 05/20/23. XRT. Had f/u [facility] - 10/15/23 - recommended f/u and diagnostic mammogram in 05/2024. Mammogram - ok. Recommended f/u diagnostic mammogram in one year. Saw Dr Rennie 06/13/24  - continues on anastrozole .  had intolerance to fosamax . Saw endocrinology 02/14/24 - recommended reclast. (Reclast infusion 03/28/24). Needs bone density. Discuss with endocrinology - regarding follow up. She is exercising - walking. No chest pain or sob reported. Discussed labs.    Past Medical History:  Diagnosis Date   Allergy 1970's.   Blood transfusion without reported diagnosis 1983   Cancer (HCC) 02-2023   Cataract 2023   Environmental allergies    Hypercholesterolemia    Hypertension    Migraines    Past Surgical History:  Procedure Laterality Date   ABDOMINAL HYSTERECTOMY  12/21/1998   APPENDECTOMY  12/21/1998   BREAST SURGERY  05/2023   at [facility] lumpectomy   CATARACT EXTRACTION W/PHACO Left 04/23/2021   Procedure: CATARACT EXTRACTION PHACO AND INTRAOCULAR LENS PLACEMENT (IOC) LEFT panoptix toric 10.41 01:22.8 12.6%;  Surgeon: Mittie Gaskin, MD;  Location: [facility];  Service: Ophthalmology;  Laterality: Left;   CATARACT EXTRACTION W/PHACO Right 05/07/2021   Procedure: CATARACT EXTRACTION PHACO AND INTRAOCULAR LENS PLACEMENT (IOC) RIGHT  panoptix 12.85 01:33.8 13.7%;  Surgeon: Mittie Gaskin, MD;  Location: [facility];  Service: Ophthalmology;  Laterality: Right;   NOSE SURGERY  12/21/1965   SEPTOPLASTY     SHOULDER ARTHROSCOPY WITH OPEN ROTATOR CUFF REPAIR Right 12/05/2015   Procedure: SHOULDER ARTHROSCOPY WITH MINI OPEN ROTATOR CUFF  REPAIR. DISTAL CLAVICLE EXCISION;  Surgeon: Franky Cranker, MD;  Location: [facility];  Service: Orthopedics;  Laterality: Right;   Family History  Problem Relation Age of Onset   Heart disease Mother    Hyperlipidemia Mother    Hypertension Mother    Lung cancer Father        dx 89s   Hyperlipidemia Father    Hypertension Father    Prostate cancer Father        dx 1s   Cancer Father    Breast cancer Maternal Aunt        dx 60s-70s   Breast cancer Maternal Grandmother        dx 90s   Bladder Cancer Neg Hx    Kidney cancer Neg Hx    Social History   Socioeconomic History   Marital status: Married    Spouse name: Not on file   Number of children: 2   Years of education: Not on file   Highest education level: Bachelor's degree (e.g., BA, AB, BS)  Occupational History   Not on file  Tobacco Use   Smoking status: Never   Smokeless tobacco: Never  Vaping Use   Vaping status: Never Used  Substance and Sexual Activity   Alcohol use: No    Alcohol/week: 0.0 standard drinks of alcohol   Drug use: No   Sexual activity: Not Currently  Other Topics Concern   Not on file  Social History Narrative   married   Social Drivers of Health   Financial Resource Strain: Low Risk  (07/12/2024)  Overall Financial Resource Strain (CARDIA)    Difficulty of Paying Living Expenses: Not hard at all  Food Insecurity: No Food Insecurity (07/12/2024)   Hunger Vital Sign    Worried About Running Out of Food in the Last Year: Never true    Ran Out of Food in the Last Year: Never true  Transportation Needs: No Transportation Needs (07/12/2024)   PRAPARE - Administrator, Civil Service (Medical): No    Lack of Transportation (Non-Medical): No  Physical Activity: Insufficiently Active (07/12/2024)   Exercise Vital Sign    Days of Exercise per Week: 3 days    Minutes of Exercise per Session: 40 min  Stress: No Stress Concern Present (07/12/2024)   [employer] of Occupational  Health - Occupational Stress Questionnaire    Feeling of Stress: Not at all  Social Connections: Socially Integrated (07/12/2024)   Social Connection and Isolation Panel    Frequency of Communication with Friends and Family: More than three times a week    Frequency of Social Gatherings with Friends and Family: Twice a week    Attends Religious Services: More than 4 times per year    Active Member of [employer] or Organizations: Yes    Attends Engineer, Structural: More than 4 times per year    Marital Status: Married     Review of Systems  Constitutional:  Negative for appetite change and unexpected weight change.  HENT:  Negative for congestion, sinus pressure and sore throat.   Eyes:  Negative for pain and visual disturbance.  Respiratory:  Negative for cough, chest tightness and shortness of breath.   Cardiovascular:  Negative for chest pain, palpitations and leg swelling.  Gastrointestinal:  Negative for abdominal pain, diarrhea, nausea and vomiting.  Genitourinary:  Negative for difficulty urinating and dysuria.  Musculoskeletal:  Negative for joint swelling and myalgias.  Skin:  Negative for color change and rash.  Neurological:  Negative for dizziness and headaches.  Hematological:  Negative for adenopathy. Does not bruise/bleed easily.  Psychiatric/Behavioral:  Negative for agitation and dysphoric mood.        Objective:     BP 110/60   Pulse 66   Temp (!) 97.5 F (36.4 C) (Oral)   Ht 5' (1.524 m)   Wt 124 lb 12 oz (56.6 kg)   LMP 11/28/1998   SpO2 95%   BMI 24.36 kg/m  Wt Readings from Last 3 Encounters:  11/14/24 124 lb 12 oz (56.6 kg)  10/05/24 123 lb 3.2 oz (55.9 kg)  07/13/24 125 lb (56.7 kg)    Physical Exam Vitals reviewed.  Constitutional:      General: She is not in acute distress.    Appearance: Normal appearance.  HENT:     Head: Normocephalic and atraumatic.     Right Ear: External ear normal.     Left Ear: External ear normal.      Mouth/Throat:     Pharynx: No oropharyngeal exudate or posterior oropharyngeal erythema.  Eyes:     General: No scleral icterus.       Right eye: No discharge.        Left eye: No discharge.     Conjunctiva/sclera: Conjunctivae normal.  Neck:     Thyroid : No thyromegaly.  Cardiovascular:     Rate and Rhythm: Normal rate and regular rhythm.  Pulmonary:     Effort: No respiratory distress.     Breath sounds: Normal breath sounds. No wheezing.  Abdominal:  General: Bowel sounds are normal.     Palpations: Abdomen is soft.     Tenderness: There is no abdominal tenderness.  Musculoskeletal:        General: No swelling or tenderness.     Cervical back: Neck supple. No tenderness.  Lymphadenopathy:     Cervical: No cervical adenopathy.  Skin:    Findings: No erythema or rash.  Neurological:     Mental Status: She is alert.  Psychiatric:        Mood and Affect: Mood normal.        Behavior: Behavior normal.         Outpatient Encounter Medications as of 11/14/2024  Medication Sig   acetaminophen  (TYLENOL ) 500 MG tablet Take 500 mg by mouth every 6 (six) hours as needed.   amLODipine  (NORVASC ) 5 MG tablet Take 1 tablet (5 mg total) by mouth daily.   anastrozole  (ARIMIDEX ) 1 MG tablet Take 1 tablet (1 mg total) by mouth daily.   Ascorbic Acid (VITAMIN C) 1000 MG tablet Take 1,000 mg by mouth daily.   atorvastatin  (LIPITOR) 40 MG tablet Take 1 tablet (40 mg total) by mouth daily.   cetirizine  (ZYRTEC ) 10 MG tablet Take 1 tablet (10 mg total) by mouth daily.   Cholecalciferol (VITAMIN D3) 25 MCG (1000 UT) CAPS Take 1 capsule by mouth daily.   CRANBERRY EXTRACT PO Take 1 capsule by mouth 2 (two) times daily.   D-Mannose 500 MG CAPS Take 500 mg by mouth in the morning and at bedtime.   hydrochlorothiazide  (HYDRODIURIL ) 12.5 MG tablet Take 1 tablet (12.5 mg total) by mouth daily.   ibuprofen  (ADVIL ) 600 MG tablet Take 1 tablet (600 mg total) by mouth 3 (three) times daily.  (Patient taking differently: Take 600 mg by mouth 3 (three) times daily as needed.)   losartan  (COZAAR ) 100 MG tablet Take 1 tablet (100 mg total) by mouth daily.   polyethylene glycol (MIRALAX / GLYCOLAX) 17 g packet Take 17 g by mouth every other day.   prednisoLONE acetate (PRED FORTE) 1 % ophthalmic suspension SMARTSIG:In Eye(s)   Probiotic Product (ALIGN PO) Take by mouth as needed.   sulfamethoxazole -trimethoprim  (BACTRIM  DS) 800-160 MG tablet Take 1 tablet by mouth 2 (two) times daily.   triamcinolone  (NASACORT ) 55 MCG/ACT nasal inhaler Place 2 sprays into the nose as needed.   No facility-administered encounter medications on file as of 11/14/2024.     Lab Results  Component Value Date   WBC 6.2 11/13/2024   HGB 13.0 11/13/2024   HCT 38.9 11/13/2024   PLT 197.0 11/13/2024   GLUCOSE 83 11/13/2024   CHOL 153 11/13/2024   TRIG 148.0 11/13/2024   HDL 53.10 11/13/2024   LDLDIRECT 103.0 05/21/2015   LDLCALC 71 11/13/2024   ALT 16 11/13/2024   AST 17 11/13/2024   NA 142 11/13/2024   K 3.8 11/13/2024   CL 105 11/13/2024   CREATININE 0.73 11/13/2024   BUN 12 11/13/2024   CO2 30 11/13/2024   TSH 1.50 03/24/2024   INR 1.0 03/17/2022    DG Bone Density Result Date: 08/09/2023 EXAM: DUAL X-RAY ABSORPTIOMETRY (DXA) FOR BONE MINERAL DENSITY IMPRESSION: Your patient Molley Houser completed a BMD test on 08/09/2023 using the [employer] DXA System (software version: 14.10) manufactured by [employer]. The following summarizes the results of our evaluation. Technologist:VLM PATIENT BIOGRAPHICAL: Name: Selita, Staiger Patient ID: [id number] Birth Date: 06-22-1946 Height: 62.0 in. Gender: Female Exam Date: 08/09/2023 Weight: 125.6 lbs.  Indications: Caucasian, History of Breast Cancer, Hysterectomy, Postmenopausal Fractures: Treatments: calcium  w/ vit D DENSITOMETRY RESULTS: Site          Region     Measured Date Measured Age WHO Classification Young Adult T-score BMD         %Change  vs. Previous Significant Change (*) Right Forearm Radius 33% 08/09/20[age] Oste[age] -1.6 0.737 g/cm2 - - DualFemur Total Left 8/09/2023 [age] Osteoporosis -2.6 0.686 g/cm2 - - ASSESSMENT: The BMD measured at Femur Total Left is 0.686 g/cm2 with a T-score of -2.6. This patient is considered osteoporotic according to World Health Organization [facility]) criteria. Lumbar spine was not utilized due to advanced degenerative changes and scoliosis. The scan quality is good. [employer] [facility]) criteria for post-menopausal, Caucasian Women: Normal:                   T-score at or above -1 SD Osteopenia/low bone mass: T-score between -1 and -2.5 SD Osteoporosis:             T-score at or below -2.5 SD RECOMMENDATIONS: 1. All patients should optimize calcium  and vitamin D  intake. 2. Consider FDA-approved medical therapies in postmenopausal women and men aged 61 years and older, based on the following: a. A hip or vertebral(clinical or morphometric) fracture b. T-score < -2.5 at the femoral neck or spine after appropriate evaluation to exclude secondary causes c. Low bone mass (T-score between -1.0 and -2.5 at the femoral neck or spine) and a 10-year probability of a hip fracture > 3% or a 10-year probability of a major osteoporosis-related fracture > 20% based on the [place] -adapted WHO algorithm 3. Clinician judgment and/or patient preferences may indicate treatment for people with 10-year fracture probabilities above or below these levels FOLLOW-UP: People with diagnosed cases of osteoporosis or at high risk for fracture should have regular bone mineral density tests. For patients eligible for Medicare, routine testing is allowed once every 2 years. The testing frequency can be increased to one year for patients who have rapidly progressing disease, those who are receiving or discontinuing medical therapy to restore bone mass, or have additional risk factors. I have reviewed this report, and agree with the above  findings. [facility] Electronically Signed   By: Rosaline Collet M.D.   On: 08/09/2023 14:57       Assessment & Plan:  Health care maintenance Assessment & Plan: Physical today 11/14/24. Colonoscopy 08/2020.  No repeat colonoscopy.  Mammogram 06/07/24 - Birads II. Recommended f/u diagnostic mammogram in 12 months.  Discussed bone denstiy.    Hypercholesterolemia Assessment & Plan: Continue lipitor.  Low cholesterol diet and exercise.  Follow lipid panel and liver function tests. Discussed labs.  Lab Results  Component Value Date   CHOL 153 11/13/2024   HDL 53.10 11/13/2024   LDLCALC 71 11/13/2024   LDLDIRECT 103.0 05/21/2015   TRIG 148.0 11/13/2024   CHOLHDL 3 11/13/2024     Orders: -     Hepatic function panel; Future -     Lipid panel; Future -     Basic metabolic panel with GFR; Future -     TSH; Future  Stress Assessment & Plan: Overall appears to be handling things well. Follow.    Osteoporosis without current pathological fracture, unspecified osteoporosis type Assessment & Plan: Reclast infusion 03/28/24. F/u endocrinology. Discuss f/u bone density.    Primary hypertension Assessment & Plan: Continue amlodipine  and hctz. Follow pressures. Follow metabolic panel. No change in medication today.  Carcinoma of upper-outer quadrant of left breast in female, estrogen receptor positive (HCC) Assessment & Plan: Left breast stage I breast cancer ER/PR positive HER2 negative [s/p lumpectomy [facility]; Dr.Hwang] ; pT1b pSnLNx-M0- G-2. S/p left breast partial mastectomy 04/22/23. Had to go back for f/u surgery 05/20/23.  XRT.  Incision - closed.  Started anastrozole . Previously breast lesion - resolved.  Mammogram 06/07/24 - birads II. Continue f/u with oncology.       Allena Hamilton, MD

## 2024-11-14 NOTE — Assessment & Plan Note (Addendum)
 Physical today 11/14/24. Colonoscopy 08/2020.  No repeat colonoscopy.  Mammogram 06/07/24 - Birads II. Recommended f/u diagnostic mammogram in 12 months.  Discussed bone denstiy.

## 2024-11-15 ENCOUNTER — Encounter: Admitting: Internal Medicine

## 2024-11-19 ENCOUNTER — Encounter: Payer: Self-pay | Admitting: Internal Medicine

## 2024-11-19 NOTE — Assessment & Plan Note (Signed)
 Left breast stage I breast cancer ER/PR positive HER2 negative [s/p lumpectomy [facility]; Dr.Hwang] ; pT1b pSnLNx-M0- G-2. S/p left breast partial mastectomy 04/22/23. Had to go back for f/u surgery 05/20/23.  XRT.  Incision - closed.  Started anastrozole . Previously breast lesion - resolved.  Mammogram 06/07/24 - birads II. Continue f/u with oncology.

## 2024-11-19 NOTE — Assessment & Plan Note (Signed)
 Reclast infusion 03/28/24. F/u endocrinology. Discuss f/u bone density.

## 2024-11-19 NOTE — Assessment & Plan Note (Signed)
 Continue amlodipine  and hctz. Follow pressures. Follow metabolic panel. No change in medication today.

## 2024-11-19 NOTE — Assessment & Plan Note (Signed)
 Continue lipitor.  Low cholesterol diet and exercise.  Follow lipid panel and liver function tests. Discussed labs.  Lab Results  Component Value Date   CHOL 153 11/13/2024   HDL 53.10 11/13/2024   LDLCALC 71 11/13/2024   LDLDIRECT 103.0 05/21/2015   TRIG 148.0 11/13/2024   CHOLHDL 3 11/13/2024

## 2024-11-19 NOTE — Assessment & Plan Note (Signed)
 Overall appears to be handling things well.  Follow.  ?

## 2024-12-11 ENCOUNTER — Other Ambulatory Visit: Payer: Self-pay | Admitting: Internal Medicine

## 2025-01-01 ENCOUNTER — Inpatient Hospital Stay: Attending: Internal Medicine | Admitting: Internal Medicine

## 2025-01-01 ENCOUNTER — Encounter: Payer: Self-pay | Admitting: Internal Medicine

## 2025-01-01 VITALS — BP 123/54 | HR 75 | Temp 96.5°F | Resp 16 | Ht 60.0 in | Wt 125.2 lb

## 2025-01-01 DIAGNOSIS — Z170 Estrogen receptor positive status [ER+]: Secondary | ICD-10-CM

## 2025-01-01 DIAGNOSIS — C50412 Malignant neoplasm of upper-outer quadrant of left female breast: Secondary | ICD-10-CM

## 2025-01-01 MED ORDER — ANASTROZOLE 1 MG PO TABS: 1.0000 mg | ORAL_TABLET | Freq: Every day | ORAL | 1 refills | Status: AC

## 2025-01-01 NOTE — Assessment & Plan Note (Signed)
#   Left breast stage I breast cancer ER/PR positive HER2 negative [s/p lumpectomy [facility]; Dr.Hwang] ; pT1b pSnLNx-M0- G-2. S/p re-excision [[facility]]- s/p radiation [facility] on 7/29/ 2024.  JUNE 2024- RS-23; recurrence < 9. [facility]- No mammographic evidence of malignancy in either breast.  Postlumpectomy  changes are seen in the left breast; Mammo Diagnostic in 12 Months is recommended for both breasts (per  lumpectomy protocol).   # currently on adjuvant  Anastrozole  [x 5 years; till AUG 2029]- tolerating well.refilled-  No side effects noted.   # OSTEOPOROSIS-AUG 2024- BMD [Dr.Scott; ]  T-score of -2.6 Currently  OFF Fosamax  [GERD] ; Reclast infusion 03/28/24. - Dr.Scott- stable  # Reflux- ? Reclast- recommend symptomatic treatment.   [facility]-mammo # DISPOSITION: # follow up in 6 months; MD; No labs-Dr.B

## 2025-01-01 NOTE — Progress Notes (Signed)
 No concerns today.

## 2025-01-01 NOTE — Progress Notes (Signed)
 [facility] CONSULT NOTE  Patient Care Team: Glendia Shad, MD as PCP - General (Internal Medicine) Georgina Shasta POUR, RN as Oncology Nurse Navigator Rennie Cindy SAUNDERS, MD as Consulting Physician (Oncology)  CHIEF COMPLAINTS/PURPOSE OF CONSULTATION: Breast cancer  #  Oncology History Overview Note   [facility]- MARCH 2024-  There is a subtle 0.8 cm x 0.6 cm x 0.5 cm  irregularly shaped,  Invasive adenocarcinoma of the breast (7 mm on core). Histologic type: Ductal with lobular features Provisional Nottingham combined histologic grade: 2 of 3 Tubule formation score: 3 Nuclear pleomorphism score: 2 Mitotic rate score:  1   Ductal carcinoma in situ (DCIS): Present Type of in-situ carcinoma: Solid and cribriform Nuclear grade of in-situ carcinoma: 2  MARGINS   Margin Status for Invasive Carcinoma:    All margins negative for invasive carcinoma     Distance from Invasive Carcinoma to Closest Margin:    Less than: 1 mm     Closest Margin(s) to Invasive Carcinoma:    Medial   Margin Status for DCIS:    All margins negative for DCIS     Distance from DCIS to Closest Margin:    Greater than: 5 mm     Closest Margin(s) to DCIS:    all margins   REGIONAL LYMPH NODES   Regional Lymph Node Status:    Not applicable (no regional lymph nodes submitted or found)     pT Category:    pT1b   pN Category:    pN not assigned (no nodes submitted or found)   SPECIAL STUDIES     Estrogen Receptor (ER) Status:    Positive (greater than 10% of cells demonstrate nuclear positivity)       Percentage of Cells with Nuclear Positivity:    99 %     Progesterone Receptor (PgR) Status:    Positive       Percentage of Cells with Nuclear Positivity:    64 %     HER2 (by immunohistochemistry):    Negative (Score 1+)   # OCT 2024- plan to start adjuvant  Anastrozole  [x 5 years; till  OCT 2029]   Carcinoma of upper-outer quadrant of left breast in female, estrogen receptor positive (HCC)  03/22/2023  Initial Diagnosis   Carcinoma of upper-outer quadrant of left breast in female, estrogen receptor positive   03/22/2023 Cancer Staging   Staging form: Breast, AJCC 8th Edition - Clinical: Stage IA (cT1b, cN0, cM0, G2, ER+, PR+, HER2-) - Signed by Rennie Cindy SAUNDERS, MD on 03/22/2023 Histologic grading system: 3 grade system    Genetic Testing   Negative genetic testing. No pathogenic variants identified on the [facility] Multi-Cancer+RNA panel. The report date is 05/23/2023.  The Multi-Cancer + RNA Panel offered by [facility] includes sequencing and/or deletion/duplication analysis of the following 70 genes:  AIP*, ALK, APC*, ATM*, AXIN2*, BAP1*, BARD1*, BLM*, BMPR1A*, BRCA1*, BRCA2*, BRIP1*, CDC73*, CDH1*, CDK4, CDKN1B*, CDKN2A, CHEK2*, CTNNA1*, DICER1*, EPCAM, EGFR, FH*, FLCN*, GREM1, HOXB13, KIT, LZTR1, MAX*, MBD4, MEN1*, MET, MITF, MLH1*, MSH2*, MSH3*, MSH6*, MUTYH*, NF1*, NF2*, NTHL1*, PALB2*, PDGFRA, PMS2*, POLD1*, POLE*, POT1*, PRKAR1A*, PTCH1*, PTEN*, RAD51C*, RAD51D*, RB1*, RET, SDHA*, SDHAF2*, SDHB*, SDHC*, SDHD*, SMAD4*, SMARCA4*, SMARCB1*, SMARCE1*, STK11*, SUFU*, TMEM127*, TP53*, TSC1*, TSC2*, VHL*. RNA analysis is performed for * genes.'    HISTORY OF PRESENTING ILLNESS: Patient ambulating-independently.Accompanied by family-husband.   Jamie Elliott 79 y.o.  female patient with left  breast cancer stage I ER/PR positive HER2 negative status postlumpectomy [[facility]] s/p  Radiation on adjuvant anastrazole is here for follow-up/review results of the mammogram done at [facility].   Discussed the use of AI scribe software for clinical note transcription with the patient, who gave verbal consent to proceed.  History of Present Illness   Jamie Elliott is a 79 year old female with early stage, ER/PR positive breast cancer status post surgery and radiation, currently on adjuvant anastrozole , who presents for routine oncology follow-up.  She continues adjuvant anastrozole  therapy for ER/PR positive breast  cancer and undergoes regular surveillance, including mammograms, surgery, and radiation at [facility]. Her most recent mammogram in June was performed at [facility], and she recalls that it was reviewed with her during this visit. She denies new breast masses, pain, discharge, abnormal bleeding, bruising, or constitutional symptoms such as fatigue, weight loss, or fevers.  She was diagnosed with osteoporosis following bone density evaluation and receives Reclast infusions. In December, she experienced three episodes of burning chest pain, similar to prior reflux associated with oral medications; these symptoms have resolved and have not recurred. She also had transient left arm pain in December, which reminded her of her prior rotator cuff issues; the pain has resolved and has not recurred.  She actively participates in the [employer] support program and expresses gratitude for the support received. No other acute complaints or new symptoms were reported.       Review of Systems  Constitutional:  Negative for chills, diaphoresis, fever, malaise/fatigue and weight loss.  HENT:  Negative for nosebleeds and sore throat.   Eyes:  Negative for double vision.  Respiratory:  Negative for cough, hemoptysis, sputum production, shortness of breath and wheezing.   Cardiovascular:  Negative for chest pain, palpitations, orthopnea and leg swelling.  Gastrointestinal:  Negative for abdominal pain, blood in stool, constipation, diarrhea, heartburn, melena, nausea and vomiting.  Genitourinary:  Negative for dysuria, frequency and urgency.  Musculoskeletal:  Negative for back pain and joint pain.  Skin: Negative.  Negative for itching and rash.  Neurological:  Negative for dizziness, tingling, focal weakness, weakness and headaches.  Endo/Heme/Allergies:  Does not bruise/bleed easily.  Psychiatric/Behavioral:  Negative for depression. The patient is not nervous/anxious and does not have insomnia.      MEDICAL  HISTORY:  Past Medical History:  Diagnosis Date   Allergy 1970s.   Blood transfusion without reported diagnosis 1983   Cancer (HCC) 02-2023   Cataract 2023   Environmental allergies    Hypercholesterolemia    Hypertension    Migraines     SURGICAL HISTORY: Past Surgical History:  Procedure Laterality Date   ABDOMINAL HYSTERECTOMY  12/21/1998   APPENDECTOMY  12/21/1998   BREAST SURGERY  05/2023   at [facility] lumpectomy   CATARACT EXTRACTION W/PHACO Left 04/23/2021   Procedure: CATARACT EXTRACTION PHACO AND INTRAOCULAR LENS PLACEMENT (IOC) LEFT panoptix toric 10.41 01:22.8 12.6%;  Surgeon: Mittie Gaskin, MD;  Location: [facility];  Service: Ophthalmology;  Laterality: Left;   CATARACT EXTRACTION W/PHACO Right 05/07/2021   Procedure: CATARACT EXTRACTION PHACO AND INTRAOCULAR LENS PLACEMENT (IOC) RIGHT  panoptix 12.85 01:33.8 13.7%;  Surgeon: Mittie Gaskin, MD;  Location: [facility];  Service: Ophthalmology;  Laterality: Right;   NOSE SURGERY  12/21/1965   SEPTOPLASTY     SHOULDER ARTHROSCOPY WITH OPEN ROTATOR CUFF REPAIR Right 12/05/2015   Procedure: SHOULDER ARTHROSCOPY WITH MINI OPEN ROTATOR CUFF REPAIR. DISTAL CLAVICLE EXCISION;  Surgeon: Franky Cranker, MD;  Location: [facility];  Service: Orthopedics;  Laterality: Right;    SOCIAL HISTORY:  Social History   Socioeconomic History   Marital status: Married    Spouse name: Not on file   Number of children: 2   Years of education: Not on file   Highest education level: Bachelor's degree (e.g., BA, AB, BS)  Occupational History   Not on file  Tobacco Use   Smoking status: Never   Smokeless tobacco: Never  Vaping Use   Vaping status: Never Used  Substance and Sexual Activity   Alcohol use: No    Alcohol/week: 0.0 standard drinks of alcohol   Drug use: No   Sexual activity: Not Currently  Other Topics Concern   Not on file  Social History Narrative   married   Social Drivers of Health    Tobacco Use: Low Risk (01/01/2025)   Patient History    Smoking Tobacco Use: Never    Smokeless Tobacco Use: Never    Passive Exposure: Not on file  Financial Resource Strain: Low Risk (07/12/2024)   Overall Financial Resource Strain (CARDIA)    Difficulty of Paying Living Expenses: Not hard at all  Food Insecurity: No Food Insecurity (07/12/2024)   Epic    Worried About Radiation Protection Practitioner of Food in the Last Year: Never true    Ran Out of Food in the Last Year: Never true  Transportation Needs: No Transportation Needs (07/12/2024)   Epic    Lack of Transportation (Medical): No    Lack of Transportation (Non-Medical): No  Physical Activity: Insufficiently Active (07/12/2024)   Exercise Vital Sign    Days of Exercise per Week: 3 days    Minutes of Exercise per Session: 40 min  Stress: No Stress Concern Present (07/12/2024)   [employer] of Occupational Health - Occupational Stress Questionnaire    Feeling of Stress: Not at all  Social Connections: Socially Integrated (07/12/2024)   Social Connection and Isolation Panel    Frequency of Communication with Friends and Family: More than three times a week    Frequency of Social Gatherings with Friends and Family: Twice a week    Attends Religious Services: More than 4 times per year    Active Member of [employer] or Organizations: Yes    Attends Banker Meetings: More than 4 times per year    Marital Status: Married  Catering Manager Violence: Not At Risk (01/10/2024)   Humiliation, Afraid, Rape, and Kick questionnaire    Fear of Current or Ex-Partner: No    Emotionally Abused: No    Physically Abused: No    Sexually Abused: No  Depression (PHQ2-9): Low Risk (01/01/2025)   Depression (PHQ2-9)    PHQ-2 Score: 0  Alcohol Screen: Low Risk (01/10/2024)   Alcohol Screen    Last Alcohol Screening Score (AUDIT): 0  Housing: Low Risk (07/12/2024)   Epic    Unable to Pay for Housing in the Last Year: No    Number of Times Moved in the  Last Year: 0    Homeless in the Last Year: No  Utilities: Not At Risk (02/13/2024)   Received from [facility] Utilities    Threatened with loss of utilities: No  Health Literacy: Adequate Health Literacy (01/10/2024)   B1300 Health Literacy    Frequency of need for help with medical instructions: Never    FAMILY HISTORY: Family History  Problem Relation Age of Onset   Heart disease Mother    Hyperlipidemia Mother    Hypertension Mother    Lung cancer Father  dx 38s   Hyperlipidemia Father    Hypertension Father    Prostate cancer Father        dx 9s   Cancer Father    Breast cancer Maternal Aunt        dx 60s-70s   Breast cancer Maternal Grandmother        dx 90s   Bladder Cancer Neg Hx    Kidney cancer Neg Hx     ALLERGIES:  is allergic to ciprofloxacin , zithromax [azithromycin], augmentin [amoxicillin-pot clavulanate], and doxycycline .  MEDICATIONS:  Current Outpatient Medications  Medication Sig Dispense Refill   acetaminophen  (TYLENOL ) 500 MG tablet Take 500 mg by mouth every 6 (six) hours as needed.     amLODipine  (NORVASC ) 5 MG tablet Take 1 tablet (5 mg total) by mouth daily. 90 tablet 3   Ascorbic Acid (VITAMIN C) 1000 MG tablet Take 1,000 mg by mouth daily.     atorvastatin  (LIPITOR) 40 MG tablet Take 1 tablet (40 mg total) by mouth daily. 90 tablet 3   cetirizine  (ZYRTEC ) 10 MG tablet Take 1 tablet (10 mg total) by mouth daily. 90 tablet 0   Cholecalciferol (VITAMIN D3) 25 MCG (1000 UT) CAPS Take 1 capsule by mouth daily.     CRANBERRY EXTRACT PO Take 1 capsule by mouth 2 (two) times daily.     D-Mannose 500 MG CAPS Take 500 mg by mouth in the morning and at bedtime.     hydrochlorothiazide  (HYDRODIURIL ) 12.5 MG tablet Take 1 tablet (12.5 mg total) by mouth daily. 90 tablet 3   ibuprofen  (ADVIL ) 600 MG tablet Take 1 tablet (600 mg total) by mouth 3 (three) times daily. (Patient taking differently: Take 600 mg by mouth 3 (three)  times daily as needed.) 15 tablet 0   losartan  (COZAAR ) 100 MG tablet Take 1 tablet (100 mg total) by mouth daily. 90 tablet 3   polyethylene glycol (MIRALAX / GLYCOLAX) 17 g packet Take 17 g by mouth every other day.     prednisoLONE acetate (PRED FORTE) 1 % ophthalmic suspension SMARTSIG:In Eye(s)     Probiotic Product (ALIGN PO) Take by mouth as needed.     triamcinolone  (NASACORT ) 55 MCG/ACT nasal inhaler Place 2 sprays into the nose as needed. 1 Inhaler 5   anastrozole  (ARIMIDEX ) 1 MG tablet Take 1 tablet (1 mg total) by mouth daily. 90 tablet 1   No current facility-administered medications for this visit.     PHYSICAL EXAMINATION:   Vitals:   01/01/25 0915  BP: (!) 123/54  Pulse: 75  Resp: 16  Temp: (!) 96.5 F (35.8 C)  SpO2: 100%       Filed Weights   01/01/25 0915  Weight: 125 lb 3.2 oz (56.8 kg)        Physical Exam Vitals and nursing note reviewed.  HENT:     Head: Normocephalic and atraumatic.     Mouth/Throat:     Pharynx: Oropharynx is clear.  Eyes:     Extraocular Movements: Extraocular movements intact.     Pupils: Pupils are equal, round, and reactive to light.  Cardiovascular:     Rate and Rhythm: Normal rate and regular rhythm.  Pulmonary:     Comments: Decreased breath sounds bilaterally.  Abdominal:     Palpations: Abdomen is soft.  Musculoskeletal:        General: Normal range of motion.     Cervical back: Normal range of motion.  Skin:    General: Skin is warm.  Neurological:     General: No focal deficit present.     Mental Status: She is alert and oriented to person, place, and time.  Psychiatric:        Behavior: Behavior normal.        Judgment: Judgment normal.      LABORATORY DATA:  I have reviewed the data as listed Lab Results  Component Value Date   WBC 6.2 11/13/2024   HGB 13.0 11/13/2024   HCT 38.9 11/13/2024   MCV 92.8 11/13/2024   PLT 197.0 11/13/2024   Recent Labs    03/24/24 0857 07/05/24 0757  11/13/24 0903  NA 141 140 142  K 3.7 3.7 3.8  CL 105 104 105  CO2 29 31 30   GLUCOSE 89 85 83  BUN 19 16 12   CREATININE 0.76 0.81 0.73  CALCIUM  10.1 10.2 10.0  PROT 6.4 6.4 5.9*  ALBUMIN 4.2 4.0 4.1  AST 18 19 17   ALT 21 18 16   ALKPHOS 72 74 77  BILITOT 0.6 0.6 0.6  BILIDIR 0.1 0.1 0.1    RADIOGRAPHIC STUDIES: I have personally reviewed the radiological images as listed and agreed with the findings in the report. No results found.  ASSESSMENT & PLAN:   Carcinoma of upper-outer quadrant of left breast in female, estrogen receptor positive (HCC) # Left breast stage I breast cancer ER/PR positive HER2 negative [s/p lumpectomy [facility]; Dr.Hwang] ; pT1b pSnLNx-M0- G-2. S/p re-excision [[facility]]- s/p radiation at [place] on 7/29/ 2024.  JUNE 2024- RS-23; recurrence < 9. [facility]- No mammographic evidence of malignancy in either breast.  Postlumpectomy  changes are seen in the left breast; Mammo Diagnostic in 12 Months is recommended for both breasts (per  lumpectomy protocol).   # currently on adjuvant  Anastrozole  [x 5 years; till AUG 2029]- tolerating well.refilled-  No side effects noted.   # OSTEOPOROSIS-AUG 2024- BMD [Dr.Scott; ]  T-score of -2.6 Currently  OFF Fosamax  [GERD] ; Reclast infusion 03/28/24. - Dr.Scott- stable  # Reflux- ? Reclast- recommend symptomatic treatment.   [facility]-mammo # DISPOSITION: # follow up in 6 months; MD; No labs-Dr.B  All questions were answered. The patient/family knows to call the clinic with any problems, questions or concerns.    Cindy JONELLE Joe, MD 01/01/2025 10:10 AM

## 2025-01-12 ENCOUNTER — Ambulatory Visit: Payer: Medicare Other

## 2025-01-12 DIAGNOSIS — Z0000 Encounter for general adult medical examination without abnormal findings: Secondary | ICD-10-CM | POA: Diagnosis not present

## 2025-01-12 NOTE — Patient Instructions (Signed)
 [name],  Thank you for taking the time for your Medicare Wellness Visit. I appreciate your continued commitment to your health goals. Please review the care plan we discussed, and feel free to reach out if I can assist you further.  Please note that Annual Wellness Visits do not include a physical exam. Some assessments may be limited, especially if the visit was conducted virtually. If needed, we may recommend an in-person follow-up with your provider.  Ongoing Care Seeing your primary care provider every 3 to 6 months helps us  monitor your health and provide consistent, personalized care. 03/14/25 @830    Referrals If a referral was made during today's visit and you haven't received any updates within two weeks, please contact the referred provider directly to check on the status.  Recommended Screenings:  Health Maintenance  Topic Date Due   Zoster (Shingles) Vaccine (1 of 2) 01/27/1965   Medicare Annual Wellness Visit  01/09/2025   Flu Shot  03/20/2025*   COVID-19 Vaccine (8 - Pfizer risk 2025-26 season) 04/02/2025   Breast Cancer Screening  06/07/2025   Osteoporosis screening with Bone Density Scan  08/08/2025   DTaP/Tdap/Td vaccine (3 - Td or Tdap) 07/13/2034   Pneumococcal Vaccine for age over 64  Completed   Hepatitis C Screening  Completed   Meningitis B Vaccine  Aged Out   Colon Cancer Screening  Discontinued  *Topic was postponed. The date shown is not the original due date.       01/12/2025    8:57 AM  Advanced Directives  Does Patient Have a Medical Advance Directive? Yes  Type of Advance Directive Healthcare Power of Attorney  Does patient want to make changes to medical advance directive? No - Patient declined  Copy of Healthcare Power of Attorney in Chart? No - copy requested  Would patient like information on creating a medical advance directive? No - Patient declined    Vision: Annual vision screenings are recommended for early detection of glaucoma,  cataracts, and diabetic retinopathy. These exams can also reveal signs of chronic conditions such as diabetes and high blood pressure.  Dental: Annual dental screenings help detect early signs of oral cancer, gum disease, and other conditions linked to overall health, including heart disease and diabetes.  Please see the attached documents for additional preventive care recommendations.

## 2025-01-12 NOTE — Progress Notes (Signed)
 "  Chief Complaint  Patient presents with   Medicare Wellness     Subjective:   Jamie Elliott is a 79 y.o. female who presents for a Medicare Annual Wellness Visit.  Visit info / Clinical Intake: Medicare Wellness Visit Type:: Subsequent Annual Wellness Visit Persons participating in visit and providing information:: patient Medicare Wellness Visit Mode:: Telephone If telephone:: video error Since this visit was completed virtually, some vitals may be partially provided or unavailable. Missing vitals are due to the limitations of the virtual format.: Unable to obtain vitals - no equipment If Telephone or Video please confirm:: I connected with patient using audio/video enable telemedicine. I verified patient identity with two identifiers, discussed telehealth limitations, and patient agreed to proceed. Patient Location:: Home Provider Location:: Home Interpreter Needed?: No Pre-visit prep was completed: yes AWV questionnaire completed by patient prior to visit?: no Living arrangements:: lives with spouse/significant other Patient's Overall Health Status Rating: very good Typical amount of pain: none Does pain affect daily life?: no Are you currently prescribed opioids?: no  Dietary Habits and Nutritional Risks How many meals a day?: 3 Eats fruit and vegetables daily?: yes Most meals are obtained by: preparing own meals In the last 2 weeks, have you had any of the following?: none Diabetic:: no  Functional Status Activities of Daily Living (to include ambulation/medication): Independent Ambulation: Independent Medication Administration: Independent Home Management (perform basic housework or laundry): Independent Manage your own finances?: yes Primary transportation is: driving Concerns about vision?: no *vision screening is required for WTM* Concerns about hearing?: no  Fall Screening Falls in the past year?: 0 Number of falls in past year: 0 Was there an injury with  Fall?: 0 Fall Risk Category Calculator: 0 Patient Fall Risk Level: Low Fall Risk  Fall Risk Patient at Risk for Falls Due to: No Fall Risks Fall risk Follow up: Falls evaluation completed  Home and Transportation Safety: All rugs have non-skid backing?: yes All stairs or steps have railings?: yes Grab bars in the bathtub or shower?: yes Have non-skid surface in bathtub or shower?: yes Good home lighting?: yes Regular seat belt use?: yes Hospital stays in the last year:: no  Cognitive Assessment Difficulty concentrating, remembering, or making decisions? : no Will 6CIT or Mini Cog be Completed: yes What year is it?: 0 points What month is it?: 0 points Give patient an address phrase to remember (5 components): [address] [place] About what time is it?: 0 points Count backwards from 20 to 1: 0 points Say the months of the year in reverse: 0 points Repeat the address phrase from earlier: 0 points 6 CIT Score: 0 points  Advance Directives (For Healthcare) Does Patient Have a Medical Advance Directive?: Yes Does patient want to make changes to medical advance directive?: No - Patient declined Type of Advance Directive: Healthcare Power of Attorney Copy of Healthcare Power of Attorney in Chart?: No - copy requested Copy of Living Will in Chart?: No - copy requested Would patient like information on creating a medical advance directive?: No - Patient declined  Reviewed/Updated  Reviewed/Updated: Reviewed All (Medical, Surgical, Family, Medications, Allergies, Care Teams, Patient Goals)    Allergies (verified) Ciprofloxacin , Zithromax [azithromycin], Augmentin [amoxicillin-pot clavulanate], and Doxycycline    Current Medications (verified) Outpatient Encounter Medications as of 01/12/2025  Medication Sig   acetaminophen  (TYLENOL ) 500 MG tablet Take 500 mg by mouth every 6 (six) hours as needed.   amLODipine  (NORVASC ) 5 MG tablet Take 1 tablet (5 mg  total) by mouth  daily.   anastrozole  (ARIMIDEX ) 1 MG tablet Take 1 tablet (1 mg total) by mouth daily.   Ascorbic Acid (VITAMIN C) 1000 MG tablet Take 1,000 mg by mouth daily.   atorvastatin  (LIPITOR) 40 MG tablet Take 1 tablet (40 mg total) by mouth daily.   cetirizine  (ZYRTEC ) 10 MG tablet Take 1 tablet (10 mg total) by mouth daily.   Cholecalciferol (VITAMIN D3) 25 MCG (1000 UT) CAPS Take 1 capsule by mouth daily.   CRANBERRY EXTRACT PO Take 1 capsule by mouth 2 (two) times daily.   D-Mannose 500 MG CAPS Take 500 mg by mouth in the morning and at bedtime.   hydrochlorothiazide  (HYDRODIURIL ) 12.5 MG tablet Take 1 tablet (12.5 mg total) by mouth daily.   ibuprofen  (ADVIL ) 600 MG tablet Take 1 tablet (600 mg total) by mouth 3 (three) times daily.   losartan  (COZAAR ) 100 MG tablet Take 1 tablet (100 mg total) by mouth daily.   polyethylene glycol (MIRALAX / GLYCOLAX) 17 g packet Take 17 g by mouth every other day.   prednisoLONE acetate (PRED FORTE) 1 % ophthalmic suspension SMARTSIG:In Eye(s)   Probiotic Product (ALIGN PO) Take by mouth as needed.   triamcinolone  (NASACORT ) 55 MCG/ACT nasal inhaler Place 2 sprays into the nose as needed.   No facility-administered encounter medications on file as of 01/12/2025.    History: Past Medical History:  Diagnosis Date   Allergy 1970s.   Blood transfusion without reported diagnosis 1983   Cancer (HCC) 02-2023   Cataract 2023   Environmental allergies    Hypercholesterolemia    Hypertension    Migraines    Past Surgical History:  Procedure Laterality Date   ABDOMINAL HYSTERECTOMY  12/21/1998   APPENDECTOMY  12/21/1998   BREAST SURGERY  05/2023   at [facility] lumpectomy   CATARACT EXTRACTION W/PHACO Left 04/23/2021   Procedure: CATARACT EXTRACTION PHACO AND INTRAOCULAR LENS PLACEMENT (IOC) LEFT panoptix toric 10.41 01:22.8 12.6%;  Surgeon: Mittie Gaskin, MD;  Location: [facility];  Service: Ophthalmology;  Laterality: Left;   CATARACT  EXTRACTION W/PHACO Right 05/07/2021   Procedure: CATARACT EXTRACTION PHACO AND INTRAOCULAR LENS PLACEMENT (IOC) RIGHT  panoptix 12.85 01:33.8 13.7%;  Surgeon: Mittie Gaskin, MD;  Location: [facility];  Service: Ophthalmology;  Laterality: Right;   NOSE SURGERY  12/21/1965   SEPTOPLASTY     SHOULDER ARTHROSCOPY WITH OPEN ROTATOR CUFF REPAIR Right 12/05/2015   Procedure: SHOULDER ARTHROSCOPY WITH MINI OPEN ROTATOR CUFF REPAIR. DISTAL CLAVICLE EXCISION;  Surgeon: Franky Cranker, MD;  Location: [facility];  Service: Orthopedics;  Laterality: Right;   Family History  Problem Relation Age of Onset   Heart disease Mother    Hyperlipidemia Mother    Hypertension Mother    Lung cancer Father        dx 84s   Hyperlipidemia Father    Hypertension Father    Prostate cancer Father        dx 31s   Cancer Father    Breast cancer Maternal Aunt        dx 60s-70s   Breast cancer Maternal Grandmother        dx 90s   Bladder Cancer Neg Hx    Kidney cancer Neg Hx    Social History   Occupational History   Not on file  Tobacco Use   Smoking status: Never   Smokeless tobacco: Never  Vaping Use   Vaping status: Never Used  Substance and Sexual Activity  Alcohol use: No    Alcohol/week: 0.0 standard drinks of alcohol   Drug use: No   Sexual activity: Not Currently   Tobacco Counseling Counseling given: Not Answered  SDOH Screenings   Food Insecurity: No Food Insecurity (01/12/2025)  Housing: Low Risk (01/12/2025)  Transportation Needs: No Transportation Needs (01/12/2025)  Utilities: Not At Risk (01/12/2025)  Alcohol Screen: Low Risk (01/10/2024)  Depression (PHQ2-9): Low Risk (01/12/2025)  Financial Resource Strain: Low Risk (07/12/2024)  Physical Activity: Sufficiently Active (01/12/2025)  Social Connections: Socially Integrated (01/12/2025)  Stress: No Stress Concern Present (01/12/2025)  Tobacco Use: Low Risk (01/01/2025)  Health Literacy: Adequate Health Literacy (01/12/2025)    See flowsheets for full screening details  Depression Screen PHQ 2 & 9 Depression Scale- Over the past 2 weeks, how often have you been bothered by any of the following problems? Little interest or pleasure in doing things: 0 Feeling down, depressed, or hopeless (PHQ Adolescent also includes...irritable): 0 PHQ-2 Total Score: 0 Trouble falling or staying asleep, or sleeping too much: 0 Feeling tired or having little energy: 0 Poor appetite or overeating (PHQ Adolescent also includes...weight loss): 0 Feeling bad about yourself - or that you are a failure or have let yourself or your family down: 0 Trouble concentrating on things, such as reading the newspaper or watching television (PHQ Adolescent also includes...like school work): 0 Moving or speaking so slowly that other people could have noticed. Or the opposite - being so fidgety or restless that you have been moving around a lot more than usual: 0 Thoughts that you would be better off dead, or of hurting yourself in some way: 0 PHQ-9 Total Score: 0 If you checked off any problems, how difficult have these problems made it for you to do your work, take care of things at home, or get along with other people?: Not difficult at all     Goals Addressed             This Visit's Progress    Trying to more exercise regularly and eat better.               Objective:    There were no vitals filed for this visit. There is no height or weight on file to calculate BMI.  Hearing/Vision screen No results found. Immunizations and Health Maintenance Health Maintenance  Topic Date Due   Zoster Vaccines- Shingrix (1 of 2) [birth year]1965   Medicare Annual Wellness (AWV)  01/09/2025   Influenza Vaccine  03/20/2025 (Originally 07/21/2024)   COVID-19 Vaccine (8 - Pfizer risk 2025-26 season) 04/02/2025   Mammogram  06/07/2025   Bone Density Scan  08/08/2025   DTaP/Tdap/Td (3 - Td or Tdap) 07/13/2034   Pneumococcal Vaccine: 50+ Years   Completed   Hepatitis C Screening  Completed   Meningococcal B Vaccine  Aged Out   Colonoscopy  Discontinued        Assessment/Plan:  This is a routine wellness examination for [facility].  Patient Care Team: Glendia Shad, MD as PCP - General (Internal Medicine) Georgina Shasta POUR, RN as Oncology Nurse Navigator Rennie Cindy SAUNDERS, MD as Consulting Physician (Oncology)  I have personally reviewed and noted the following in the patients chart:   Medical and social history Use of alcohol, tobacco or illicit drugs  Current medications and supplements including opioid prescriptions. Functional ability and status Nutritional status Physical activity Advanced directives List of other physicians Hospitalizations, surgeries, and ER visits in previous 12 months Vitals Screenings to include cognitive,  depression, and falls Referrals and appointments  No orders of the defined types were placed in this encounter.  In addition, I have reviewed and discussed with patient certain preventive protocols, quality metrics, and best practice recommendations. A written personalized care plan for preventive services as well as general preventive health recommendations were provided to patient.   Arnette LOISE Hoots, CMA   01/12/2025   No follow-ups on file.  After Visit Summary: (MyChart) Due to this being a telephonic visit, the after visit summary with patients personalized plan was offered to patient via MyChart   Nurse Notes: Patient states that she had her shingle vaccines in the past. She had a reaction to the covid shot last year so she will not be doing any more of those.  "

## 2025-03-12 ENCOUNTER — Other Ambulatory Visit

## 2025-03-14 ENCOUNTER — Ambulatory Visit: Admitting: Internal Medicine

## 2025-07-02 ENCOUNTER — Inpatient Hospital Stay: Admitting: Internal Medicine

## 2025-07-05 ENCOUNTER — Ambulatory Visit: Admitting: Urology

## 2026-01-15 ENCOUNTER — Ambulatory Visit
